# Patient Record
Sex: Female | Born: 1940 | Race: White | Hispanic: No | Marital: Married | Smoking: Former smoker
Health system: Southern US, Community
[De-identification: ages and names within clinical notes are randomized; demographics above are authoritative.]

## PROBLEM LIST (undated history)

## (undated) DIAGNOSIS — I5021 Acute systolic (congestive) heart failure: Secondary | ICD-10-CM

## (undated) DIAGNOSIS — S42309A Unspecified fracture of shaft of humerus, unspecified arm, initial encounter for closed fracture: Secondary | ICD-10-CM

## (undated) DIAGNOSIS — Z950 Presence of cardiac pacemaker: Secondary | ICD-10-CM

## (undated) DIAGNOSIS — I214 Non-ST elevation (NSTEMI) myocardial infarction: Secondary | ICD-10-CM

## (undated) DIAGNOSIS — R001 Bradycardia, unspecified: Secondary | ICD-10-CM

## (undated) DIAGNOSIS — K589 Irritable bowel syndrome without diarrhea: Secondary | ICD-10-CM

## (undated) DIAGNOSIS — C439 Malignant melanoma of skin, unspecified: Secondary | ICD-10-CM

## (undated) DIAGNOSIS — R739 Hyperglycemia, unspecified: Secondary | ICD-10-CM

## (undated) HISTORY — DX: Unspecified fracture of shaft of humerus, unspecified arm, initial encounter for closed fracture: S42.309A

## (undated) HISTORY — DX: Non-ST elevation (NSTEMI) myocardial infarction: I21.4

## (undated) HISTORY — DX: Malignant melanoma of skin, unspecified: C43.9

## (undated) HISTORY — DX: Acute systolic (congestive) heart failure: I50.21

## (undated) HISTORY — DX: Irritable bowel syndrome, unspecified: K58.9

## (undated) HISTORY — PX: COLONOSCOPY: SHX174

## (undated) HISTORY — DX: Presence of cardiac pacemaker: Z95.0

## (undated) SURGERY — Surgical Case
Anesthesia: *Unknown

---
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
## 1997-09-19 ENCOUNTER — Other Ambulatory Visit: Admission: RE | Admit: 1997-09-19 | Discharge: 1997-09-19 | Payer: Self-pay | Admitting: Gynecology

## 1998-05-24 HISTORY — PX: MELANOMA EXCISION: SHX5266

## 1998-10-16 ENCOUNTER — Other Ambulatory Visit: Admission: RE | Admit: 1998-10-16 | Discharge: 1998-10-16 | Payer: Self-pay | Admitting: Gynecology

## 2000-03-10 ENCOUNTER — Other Ambulatory Visit: Admission: RE | Admit: 2000-03-10 | Discharge: 2000-03-10 | Payer: Self-pay | Admitting: Gynecology

## 2001-04-06 ENCOUNTER — Other Ambulatory Visit: Admission: RE | Admit: 2001-04-06 | Discharge: 2001-04-06 | Payer: Self-pay | Admitting: Gynecology

## 2001-12-29 ENCOUNTER — Encounter: Payer: Self-pay | Admitting: Family Medicine

## 2001-12-29 ENCOUNTER — Encounter: Admission: RE | Admit: 2001-12-29 | Discharge: 2001-12-29 | Payer: Self-pay | Admitting: Family Medicine

## 2002-07-02 ENCOUNTER — Other Ambulatory Visit: Admission: RE | Admit: 2002-07-02 | Discharge: 2002-07-02 | Payer: Self-pay | Admitting: Gynecology

## 2003-05-25 HISTORY — PX: SKIN CANCER DESTRUCTION: SHX778

## 2003-05-25 LAB — HM COLONOSCOPY

## 2003-09-09 ENCOUNTER — Other Ambulatory Visit: Admission: RE | Admit: 2003-09-09 | Discharge: 2003-09-09 | Payer: Self-pay | Admitting: Gynecology

## 2004-11-16 ENCOUNTER — Other Ambulatory Visit: Admission: RE | Admit: 2004-11-16 | Discharge: 2004-11-16 | Payer: Self-pay | Admitting: Gynecology

## 2006-09-28 ENCOUNTER — Ambulatory Visit: Payer: Self-pay | Admitting: Internal Medicine

## 2006-11-21 DIAGNOSIS — C4360 Malignant melanoma of unspecified upper limb, including shoulder: Secondary | ICD-10-CM | POA: Insufficient documentation

## 2006-11-21 DIAGNOSIS — G20 Parkinson's disease: Secondary | ICD-10-CM | POA: Insufficient documentation

## 2006-11-21 DIAGNOSIS — K589 Irritable bowel syndrome without diarrhea: Secondary | ICD-10-CM | POA: Insufficient documentation

## 2007-01-03 ENCOUNTER — Ambulatory Visit: Payer: Self-pay | Admitting: Internal Medicine

## 2007-01-04 LAB — CONVERTED CEMR LAB
ALT: 14 units/L (ref 0–35)
AST: 19 units/L (ref 0–37)
Albumin: 3.6 g/dL (ref 3.5–5.2)
Alkaline Phosphatase: 33 units/L — ABNORMAL LOW (ref 39–117)
Basophils Absolute: 0 10*3/uL (ref 0.0–0.1)
Basophils Relative: 0.4 % (ref 0.0–1.0)
Bilirubin, Direct: 0.1 mg/dL (ref 0.0–0.3)
Cholesterol: 176 mg/dL (ref 0–200)
Eosinophils Absolute: 0.2 10*3/uL (ref 0.0–0.6)
Eosinophils Relative: 2.6 % (ref 0.0–5.0)
HCT: 39.3 % (ref 36.0–46.0)
HDL: 49.3 mg/dL (ref >39.0)
Hemoglobin: 13.5 g/dL (ref 12.0–15.0)
LDL Cholesterol: 95 mg/dL (ref 0–99)
Lymphocytes Relative: 31.7 % (ref 12.0–46.0)
MCHC: 34.5 g/dL (ref 30.0–36.0)
MCV: 97.7 fL (ref 78.0–100.0)
Monocytes Absolute: 0.6 10*3/uL (ref 0.2–0.7)
Monocytes Relative: 7.7 % (ref 3.0–11.0)
Neutro Abs: 4.6 10*3/uL (ref 1.4–7.7)
Neutrophils Relative %: 57.6 % (ref 43.0–77.0)
Platelets: 197 10*3/uL (ref 150–400)
RBC: 4.02 M/uL (ref 3.87–5.11)
RDW: 12.2 % (ref 11.5–14.6)
TSH: 1.9 microintl units/mL (ref 0.35–5.50)
Total Bilirubin: 0.7 mg/dL (ref 0.3–1.2)
Total CHOL/HDL Ratio: 3.6
Total Protein: 6.6 g/dL (ref 6.0–8.3)
Triglycerides: 158 mg/dL — ABNORMAL HIGH (ref 0–149)
VLDL: 32 mg/dL (ref 0–40)
WBC: 7.9 10*3/uL (ref 4.5–10.5)

## 2007-03-24 ENCOUNTER — Ambulatory Visit: Payer: Self-pay | Admitting: Internal Medicine

## 2007-08-11 ENCOUNTER — Ambulatory Visit: Payer: Self-pay | Admitting: Family Medicine

## 2007-10-24 ENCOUNTER — Ambulatory Visit: Payer: Self-pay | Admitting: Internal Medicine

## 2007-11-20 ENCOUNTER — Telehealth: Payer: Self-pay | Admitting: *Deleted

## 2008-02-26 ENCOUNTER — Ambulatory Visit: Payer: Self-pay | Admitting: Internal Medicine

## 2008-02-26 LAB — CONVERTED CEMR LAB
Bilirubin Urine: NEGATIVE
Blood in Urine, dipstick: NEGATIVE
Glucose, Urine, Semiquant: NEGATIVE
Ketones, urine, test strip: NEGATIVE
Nitrite: NEGATIVE
Protein, U semiquant: NEGATIVE
Specific Gravity, Urine: 1.02
Urobilinogen, UA: 0.2
pH: 5.5

## 2008-02-27 LAB — CONVERTED CEMR LAB
ALT: 17 units/L (ref 0–35)
AST: 21 units/L (ref 0–37)
Albumin: 3.9 g/dL (ref 3.5–5.2)
Alkaline Phosphatase: 47 units/L (ref 39–117)
BUN: 13 mg/dL (ref 6–23)
Basophils Absolute: 0 10*3/uL (ref 0.0–0.1)
Basophils Relative: 0.2 % (ref 0.0–3.0)
Bilirubin, Direct: 0.1 mg/dL (ref 0.0–0.3)
CO2: 31 meq/L (ref 19–32)
Calcium: 9.1 mg/dL (ref 8.4–10.5)
Chloride: 106 meq/L (ref 96–112)
Creatinine, Ser: 0.8 mg/dL (ref 0.4–1.2)
Eosinophils Absolute: 0.1 10*3/uL (ref 0.0–0.7)
Eosinophils Relative: 2.2 % (ref 0.0–5.0)
GFR calc Af Amer: 92 mL/min
GFR calc non Af Amer: 76 mL/min
Glucose, Bld: 84 mg/dL (ref 70–99)
HCT: 40.5 % (ref 36.0–46.0)
Hemoglobin: 13.8 g/dL (ref 12.0–15.0)
Lymphocytes Relative: 29.2 % (ref 12.0–46.0)
MCHC: 34.2 g/dL (ref 30.0–36.0)
MCV: 99.3 fL (ref 78.0–100.0)
Monocytes Absolute: 0.4 10*3/uL (ref 0.1–1.0)
Monocytes Relative: 6 % (ref 3.0–12.0)
Neutro Abs: 3.7 10*3/uL (ref 1.4–7.7)
Neutrophils Relative %: 62.4 % (ref 43.0–77.0)
Platelets: 141 10*3/uL — ABNORMAL LOW (ref 150–400)
Potassium: 3.8 meq/L (ref 3.5–5.1)
RBC: 4.08 M/uL (ref 3.87–5.11)
RDW: 11.8 % (ref 11.5–14.6)
Sodium: 146 meq/L — ABNORMAL HIGH (ref 135–145)
TSH: 2.35 microintl units/mL (ref 0.35–5.50)
Total Bilirubin: 0.7 mg/dL (ref 0.3–1.2)
Total Protein: 6.9 g/dL (ref 6.0–8.3)
WBC: 5.9 10*3/uL (ref 4.5–10.5)

## 2008-05-15 ENCOUNTER — Telehealth: Payer: Self-pay | Admitting: *Deleted

## 2008-05-15 ENCOUNTER — Ambulatory Visit: Payer: Self-pay | Admitting: Family Medicine

## 2009-02-07 ENCOUNTER — Ambulatory Visit: Payer: Self-pay | Admitting: Family Medicine

## 2009-02-17 ENCOUNTER — Encounter: Admission: RE | Admit: 2009-02-17 | Discharge: 2009-03-31 | Payer: Self-pay | Admitting: Internal Medicine

## 2009-02-21 LAB — CONVERTED CEMR LAB: Pap Smear: NORMAL

## 2009-02-21 LAB — HM MAMMOGRAPHY: HM Mammogram: NORMAL

## 2009-02-28 ENCOUNTER — Encounter: Payer: Self-pay | Admitting: Internal Medicine

## 2009-03-24 ENCOUNTER — Ambulatory Visit: Payer: Self-pay | Admitting: Internal Medicine

## 2009-03-26 LAB — CONVERTED CEMR LAB
ALT: 17 units/L (ref 0–35)
AST: 24 units/L (ref 0–37)
Albumin: 3.9 g/dL (ref 3.5–5.2)
Alkaline Phosphatase: 49 units/L (ref 39–117)
BUN: 10 mg/dL (ref 6–23)
Basophils Absolute: 0 10*3/uL (ref 0.0–0.1)
Basophils Relative: 0.2 % (ref 0.0–3.0)
Bilirubin, Direct: 0 mg/dL (ref 0.0–0.3)
CO2: 32 meq/L (ref 19–32)
Calcium: 9 mg/dL (ref 8.4–10.5)
Chloride: 109 meq/L (ref 96–112)
Creatinine, Ser: 0.8 mg/dL (ref 0.4–1.2)
Eosinophils Absolute: 0.2 10*3/uL (ref 0.0–0.7)
Eosinophils Relative: 3.6 % (ref 0.0–5.0)
GFR calc non Af Amer: 75.9 mL/min (ref >60)
Glucose, Bld: 88 mg/dL (ref 70–99)
HCT: 38 % (ref 36.0–46.0)
Hemoglobin: 13.6 g/dL (ref 12.0–15.0)
Lymphocytes Relative: 31.3 % (ref 12.0–46.0)
Lymphs Abs: 1.8 10*3/uL (ref 0.7–4.0)
MCHC: 35.9 g/dL (ref 30.0–36.0)
MCV: 98.6 fL (ref 78.0–100.0)
Monocytes Absolute: 0.4 10*3/uL (ref 0.1–1.0)
Monocytes Relative: 7.3 % (ref 3.0–12.0)
Neutro Abs: 3.2 10*3/uL (ref 1.4–7.7)
Neutrophils Relative %: 57.6 % (ref 43.0–77.0)
Platelets: 140 10*3/uL — ABNORMAL LOW (ref 150.0–400.0)
Potassium: 4.3 meq/L (ref 3.5–5.1)
RBC: 3.85 M/uL — ABNORMAL LOW (ref 3.87–5.11)
RDW: 11.9 % (ref 11.5–14.6)
Sodium: 144 meq/L (ref 135–145)
TSH: 3.25 microintl units/mL (ref 0.35–5.50)
Total Bilirubin: 0.7 mg/dL (ref 0.3–1.2)
Total Protein: 6.9 g/dL (ref 6.0–8.3)
Vit D, 25-Hydroxy: 80 ng/mL (ref 30–89)
WBC: 5.6 10*3/uL (ref 4.5–10.5)

## 2009-03-31 ENCOUNTER — Encounter (INDEPENDENT_AMBULATORY_CARE_PROVIDER_SITE_OTHER): Payer: Self-pay | Admitting: *Deleted

## 2009-09-12 ENCOUNTER — Encounter: Payer: Self-pay | Admitting: Internal Medicine

## 2009-09-17 ENCOUNTER — Ambulatory Visit: Payer: Self-pay | Admitting: Internal Medicine

## 2009-09-17 DIAGNOSIS — N951 Menopausal and female climacteric states: Secondary | ICD-10-CM | POA: Insufficient documentation

## 2009-09-30 ENCOUNTER — Telehealth: Payer: Self-pay | Admitting: Internal Medicine

## 2010-01-12 ENCOUNTER — Encounter: Payer: Self-pay | Admitting: Internal Medicine

## 2010-03-16 ENCOUNTER — Encounter: Payer: Self-pay | Admitting: Internal Medicine

## 2010-06-23 NOTE — Letter (Signed)
Summary: [facility]  [facility]   Imported By: Maryln Gottron 09/19/2009 15:45:34  _____________________________________________________________________  External Attachment:    Type:   Image     Comment:   External Document

## 2010-06-23 NOTE — Assessment & Plan Note (Signed)
Summary: always tired//ccm   Vital Signs:  Patient profile:   70 year old female Weight:      140 pounds Temp:     98.3 degrees F oral Pulse rate:   68 / minute Pulse rhythm:   regular Resp:     12 per minute BP sitting:   146 / 64  (left arm) Cuff size:   regular  Vitals Entered By: Gladis Riffle, RN (September 17, 2009 10:13 AM) CC: c/o hot flashes and fatigue since stopping hormones, also vaginal dryness so sex hurts, back stiffnes in AM and tightness at back of legs Is Patient Diabetic? No Comments discuss amantadine   CC:  c/o hot flashes and fatigue since stopping hormones, also vaginal dryness so sex hurts, and back stiffnes in AM and tightness at back of legs.  Preventive Screening-Counseling & Management  Alcohol-Tobacco     Smoking Status: never     Year Quit: 1975  Current Medications (verified): 1)  Librax 2.5-5 Mg Caps (Clidinium-Chlordiazepoxide) .... Take 1 Capsule By Mouth Three Times Daily Prn 2)  Multivitamins   Tabs (Multiple Vitamin) .... Once Daily 3)  Vitamin E Complex 400 Unit  Caps (Vitamin E) .... Once Daily 4)  B-50 Complex   Tabs (B Complex-Folic Acid) .... Once Daily 5)  Fish Oil 1000 Mg  Caps (Omega-3 Fatty Acids) .... Once Daily  Allergies (verified): No Known Drug Allergies   Impression & Recommendations:  Problem # 1:  MENOPAUSAL SYNDROME (ICD-627.2) 20 minute discussion about sxs she admits to fatigue crying vaginal dryness poor sleep hot flashes stopper hrt 1 months ago I'm convinced all of her sxs are related to [facility] HRT side efects discussed total time 20 minutes The following medications were removed from the medication list:    Premarin 0.3 Mg Tabs (Estrogens conjugated) ..... One by mouth daily Her updated medication list for this problem includes:    Premarin 0.3 Mg Tabs (Estrogens conjugated) ..... One by mouth daily  Complete Medication List: 1)  Librax 2.5-5 Mg Caps (Clidinium-chlordiazepoxide) .... Take 1  capsule by mouth three times daily prn 2)  Multivitamins Tabs (Multiple vitamin) .... Once daily 3)  Vitamin E Complex 400 Unit Caps (Vitamin e) .... Once daily 4)  B-50 Complex Tabs (B complex-folic acid) .... Once daily 5)  Fish Oil 1000 Mg Caps (Omega-3 fatty acids) .... Once daily 6)  Premarin 0.3 Mg Tabs (Estrogens conjugated) .... One by mouth daily 7)  Provera 5 Mg Tabs (Medroxyprogesterone acetate) .... Take 1 tablet by mouth once a day or as directed.  Patient Instructions: 1)  . Prescriptions: PROVERA 5 MG TABS (MEDROXYPROGESTERONE ACETATE) Take 1 tablet by mouth once a day or as directed.  #90 x 3   Entered and Authorized by:   Birdie Sons MD   Signed by:   Birdie Sons MD on 09/17/2009   Method used:   Electronically to        [facility] # [id number]* (retail)       [address]       [place], [place]  [address]       Ph: [phone]       Fax: [phone]   RxID:   [id number] PREMARIN 0.3 MG TABS (ESTROGENS CONJUGATED) one by mouth daily  #90 x 3   Entered and Authorized by:   Birdie Sons MD   Signed by:   Birdie Sons MD on 09/17/2009   Method used:   Electronically to  [facility] # [id number]* (retail)       [address]       [place], [place]  [address]       Ph: [phone]       Fax: [phone]   RxID:   [id number] PROVERA 5 MG TABS (MEDROXYPROGESTERONE ACETATE) Take 1 tablet by mouth once a day or as directed.  #90 x 3   Entered and Authorized by:   Birdie Sons MD   Signed by:   Birdie Sons MD on 09/17/2009   Method used:   Print then Give to Patient   RxID:   [id number] PREMARIN 0.3 MG TABS (ESTROGENS CONJUGATED) one by mouth daily  #90 x 3   Entered and Authorized by:   Birdie Sons MD   Signed by:   Birdie Sons MD on 09/17/2009   Method used:   Print then Give to Patient   RxID:   [id number]

## 2010-06-23 NOTE — Miscellaneous (Signed)
Summary: Immunization Entry   Immunization History:  Influenza Immunization History:    Influenza:  historical (03/13/2010) 

## 2010-06-23 NOTE — Progress Notes (Signed)
Summary: REQ FOR MEDICATION CHANGE (Medroxyprogesterone   to   Norethin)  Phone Note Call from Patient   Caller: Patient Reason for Call: Talk to Nurse, Talk to Doctor Summary of Call: Pt req that her for Rx for medication (Provera 5 Mg Tabs (Medroxyprogesterone acetate)  be changed to Norethin 5 mg Tabs.... Pt adv that these pills will be easier for her to cut in half because they are bigger.... Pt adv that Rx for med can be sent to [facility].  Pt can be reached at [phone] with any questions or concerns.  Initial call taken by: Debbra Riding,  Sep 30, 2009 1:23 PM  Follow-up for Phone Call        i don't think these are the same meds would not change Follow-up by: Birdie Sons MD,  Sep 30, 2009 3:09 PM  Additional Follow-up for Phone Call Additional follow up Details #1::        Phone Call Completed-----Called [phone] and left msg for pt to c/b so Dr Cato Mulligan instructions could be relayed to her.  Additional Follow-up by: Debbra Riding,  Sep 30, 2009 4:33 PM     Appended Document: REQ FOR MEDICATION CHANGE (Medroxyprogesterone   to   Norethin) Pt called back today and was advised of Dr Cato Mulligan instructions.... Pt acknowledged same.

## 2010-06-26 NOTE — Letter (Signed)
Summary: [facility]  [facility]   Imported By: Maryln Gottron 01/21/2010 15:20:05  _____________________________________________________________________  External Attachment:    Type:   Image     Comment:   External Document

## 2011-06-04 ENCOUNTER — Encounter: Payer: Self-pay | Admitting: Internal Medicine

## 2011-06-04 ENCOUNTER — Ambulatory Visit (INDEPENDENT_AMBULATORY_CARE_PROVIDER_SITE_OTHER): Payer: Medicare Other | Admitting: Internal Medicine

## 2011-06-04 VITALS — BP 130/74 | HR 72 | Temp 98.0°F | Ht 61.5 in | Wt 140.0 lb

## 2011-06-04 DIAGNOSIS — Z0000 Encounter for general adult medical examination without abnormal findings: Secondary | ICD-10-CM

## 2011-06-04 DIAGNOSIS — Z23 Encounter for immunization: Secondary | ICD-10-CM

## 2011-06-04 DIAGNOSIS — G20A1 Parkinson's disease without dyskinesia, without mention of fluctuations: Secondary | ICD-10-CM

## 2011-06-04 DIAGNOSIS — C4360 Malignant melanoma of unspecified upper limb, including shoulder: Secondary | ICD-10-CM

## 2011-06-04 DIAGNOSIS — G20 Parkinson's disease: Secondary | ICD-10-CM

## 2011-06-04 LAB — CBC WITH DIFFERENTIAL/PLATELET
Basophils Absolute: 0 10*3/uL (ref 0.0–0.1)
Lymphocytes Relative: 22.6 % (ref 12.0–46.0)
Monocytes Relative: 4.4 % (ref 3.0–12.0)
Platelets: 143 10*3/uL — ABNORMAL LOW (ref 150.0–400.0)
RDW: 12.7 % (ref 11.5–14.6)
WBC: 7.3 10*3/uL (ref 4.5–10.5)

## 2011-06-04 LAB — HEPATIC FUNCTION PANEL
AST: 18 U/L (ref 0–37)
Albumin: 4.1 g/dL (ref 3.5–5.2)
Alkaline Phosphatase: 41 U/L (ref 39–117)
Total Protein: 7.1 g/dL (ref 6.0–8.3)

## 2011-06-04 LAB — BASIC METABOLIC PANEL
Chloride: 107 mEq/L (ref 96–112)
Creatinine, Ser: 0.8 mg/dL (ref 0.4–1.2)
Potassium: 4.1 mEq/L (ref 3.5–5.1)
Sodium: 142 mEq/L (ref 135–145)

## 2011-06-04 NOTE — Progress Notes (Signed)
Subjective:    Lindsey Bryant is a 71 y.o. female who presents for Medicare Annual/Subsequent preventive examination.  Preventive Screening-Counseling & Management  Tobacco History  Smoking status  . Never Smoker   Smokeless tobacco  . Not on file     Problems Prior to Visit 1.   Current Problems (verified) Patient Active Problem List  Diagnoses  . MELANOMA, ARM  . PARKINSON'S  . IRRITABLE BOWEL SYNDROME  . MENOPAUSAL SYNDROME    Medications Prior to Visit No current outpatient prescriptions on file prior to visit.    Current Medications (verified) Current Outpatient Prescriptions  Medication Sig Dispense Refill  . fish oil-omega-3 fatty acids 1000 MG capsule Take 2 g by mouth daily.      . medroxyPROGESTERone (PROVERA) 5 MG tablet Take 1 tablet by mouth daily.      . NON FORMULARY Fungi Cure daily      . PREMARIN 0.3 MG tablet Take 1 tablet by mouth daily.      . trihexyphenidyl (ARTANE) 2 MG tablet Take 2 mg by mouth 3 (three) times daily with meals.      . vitamin E (VITAMIN E) 400 UNIT capsule Take 400 Units by mouth daily.         Allergies (verified) Review of patient's allergies indicates no known allergies.   PAST HISTORY  Family History Family History  Problem Relation Age of Onset  . Heart attack Mother   . Alcohol abuse Father   . Diabetes    . Breast cancer      Social History History  Substance Use Topics  . Smoking status: Never Smoker   . Smokeless tobacco: Not on file  . Alcohol Use: No     Are there smokers in your home (other than you)? No  Risk Factors Current exercise habits: exercises regularly: walking, water aerobics  Dietary issues discussed: no concerns   Cardiac risk factors: advanced age (older than 64 for men, 74 for women).  Depression Screen (Note: if answer to either of the following is "Yes", a more complete depression screening is indicated)   Over the past two weeks, have you felt down, depressed or hopeless?  No  Over the past two weeks, have you felt little interest or pleasure in doing things? No  Activities of Daily Living In your present state of health, do you have any difficulty performing the following activities?:  Driving? No Managing money?  No  Hearing Difficulties: No Do you often ask people to speak up or repeat themselves? No Do you experience ringing or noises in your ears? No   Do you feel that you have a problem with memory? No   Cognitive Testing  Alert? Yes  Normal Appearance?Yes    List the Names of Other Physician/Practitioners you currently use: 1.    Indicate any recent Medical Services you may have received from other than [facility] providers in the past year (date may be approximate).  Immunization History  Administered Date(s) Administered  . Influenza Whole 03/24/2007, 02/26/2008, 03/13/2010  . Pneumococcal Polysaccharide 05/24/2006  . Tdap 06/04/2011  . Zoster 02/26/2008    Screening Tests Health Maintenance  Topic Date Due  . Tetanus/tdap  08/04/1960  . Mammogram  08/05/1991  . Colonoscopy  08/05/1991  . Pneumococcal Polysaccharide Vaccine Age 104 And Over  08/05/2006  . Influenza Vaccine  02/22/2011  . Zostavax  Completed    All answers were reviewed with the patient and necessary referrals were made:  [facility],  MD   06/04/2011   History reviewed: allergies, current medications, past family history, past medical history, past social history, past surgical history and problem list  Review of Systems  patient denies chest pain, shortness of breath, orthopnea. Denies lower extremity edema, abdominal pain, change in appetite, change in bowel movements. Patient denies rashes, musculoskeletal complaints. No other specific complaints in a complete review of systems.    Objective:       Body mass index is 26.02 kg/(m^2). BP 130/74  Pulse 72  Temp(Src) 98 F (36.7 C) (Oral)  Ht 5' 1.5" (1.562 m)  Wt 140 lb (63.504 kg)  BMI 26.02 kg/m2    Well-developed well-nourished female in no acute distress. HEENT exam atraumatic, normocephalic, extraocular muscles are intact. Neck is supple. No jugular venous distention no thyromegaly. Chest clear to auscultation without increased work of breathing. Cardiac exam S1 and S2 are regular. Abdominal exam active bowel sounds, soft, nontender. Extremities no edema. Neurologic exam she is alert without any motor sensory deficits. Gait is normal.      Assessment:    Well Visit:health maint utd    Plan:     During the course of the visit the patient was educated and counseled about appropriate screening and preventive services including:    Pneumococcal vaccine   Influenza vaccine  Td vaccine  Colorectal cancer screening  Diet review for nutrition referral? Yes ____  Not Indicated _x___   Patient Instructions (the written plan) was given to the patient.  Medicare Attestation I have personally reviewed: The patient's medical and social history Their use of alcohol, tobacco or illicit drugs Their current medications and supplements The patient's functional ability including ADLs,fall risks, home safety risks, cognitive, and hearing and visual impairment Diet and physical activities Evidence for depression or mood disorders  The patient's weight, height, BMI, and visual acuity have been recorded in the chart.  I have made referrals, counseling, and provided education to the patient based on review of the above and I have provided the patient with a written personalized care plan for preventive services.     Judie Petit, MD   06/04/2011

## 2012-03-02 ENCOUNTER — Telehealth: Payer: Self-pay | Admitting: Internal Medicine

## 2012-03-02 NOTE — Telephone Encounter (Signed)
Schedule OV with next available

## 2012-03-02 NOTE — Telephone Encounter (Signed)
Pt called and said that Dr Sandria Manly is req that Dr Cato Mulligan order a stress test for pt. Pt has been experiencing weakness. Pls order test.

## 2012-03-02 NOTE — Telephone Encounter (Signed)
3 mths ago started feeling weak she has been taking trihexyphenudyl 6 mg taking 2 mg tid and Dr Sandria Manly d/c to see if that would help and it did not so Dr Sandria Manly told pt that maybe her heart should be checked.

## 2012-03-03 ENCOUNTER — Ambulatory Visit (INDEPENDENT_AMBULATORY_CARE_PROVIDER_SITE_OTHER): Payer: Medicare Other | Admitting: Family Medicine

## 2012-03-03 ENCOUNTER — Encounter: Payer: Self-pay | Admitting: Family Medicine

## 2012-03-03 ENCOUNTER — Inpatient Hospital Stay (HOSPITAL_COMMUNITY)
Admission: EM | Admit: 2012-03-03 | Discharge: 2012-03-07 | DRG: 244 | Disposition: A | Discharge status: Home / Self Care | Payer: Medicare Other | Attending: Cardiology | Admitting: Cardiology

## 2012-03-03 ENCOUNTER — Encounter (HOSPITAL_COMMUNITY): Payer: Self-pay | Admitting: *Deleted

## 2012-03-03 VITALS — BP 130/70 | Temp 98.1°F | Wt 138.0 lb

## 2012-03-03 DIAGNOSIS — Z23 Encounter for immunization: Secondary | ICD-10-CM

## 2012-03-03 DIAGNOSIS — I442 Atrioventricular block, complete: Secondary | ICD-10-CM | POA: Diagnosis present

## 2012-03-03 DIAGNOSIS — R278 Other lack of coordination: Secondary | ICD-10-CM

## 2012-03-03 DIAGNOSIS — I459 Conduction disorder, unspecified: Secondary | ICD-10-CM

## 2012-03-03 DIAGNOSIS — R5381 Other malaise: Secondary | ICD-10-CM

## 2012-03-03 DIAGNOSIS — I498 Other specified cardiac arrhythmias: Principal | ICD-10-CM | POA: Diagnosis present

## 2012-03-03 DIAGNOSIS — R279 Unspecified lack of coordination: Secondary | ICD-10-CM

## 2012-03-03 DIAGNOSIS — R9431 Abnormal electrocardiogram [ECG] [EKG]: Secondary | ICD-10-CM

## 2012-03-03 DIAGNOSIS — K589 Irritable bowel syndrome without diarrhea: Secondary | ICD-10-CM | POA: Diagnosis present

## 2012-03-03 DIAGNOSIS — G20 Parkinson's disease: Secondary | ICD-10-CM | POA: Diagnosis present

## 2012-03-03 DIAGNOSIS — G20A1 Parkinson's disease without dyskinesia, without mention of fluctuations: Secondary | ICD-10-CM | POA: Diagnosis present

## 2012-03-03 DIAGNOSIS — Z85820 Personal history of malignant melanoma of skin: Secondary | ICD-10-CM

## 2012-03-03 DIAGNOSIS — R531 Weakness: Secondary | ICD-10-CM

## 2012-03-03 HISTORY — DX: Bradycardia, unspecified: R00.1

## 2012-03-03 LAB — CBC WITH DIFFERENTIAL/PLATELET
Basophils Absolute: 0 10*3/uL (ref 0.0–0.1)
Basophils Relative: 0.4 % (ref 0.0–3.0)
Basophils Relative: 0 % (ref 0–1)
Eosinophils Absolute: 0.1 10*3/uL (ref 0.0–0.7)
Eosinophils Absolute: 0.2 10*3/uL (ref 0.0–0.7)
HCT: 39.7 % (ref 36.0–46.0)
Hemoglobin: 13.1 g/dL (ref 12.0–15.0)
Lymphocytes Relative: 32 % (ref 12.0–46.0)
MCH: 33.1 pg (ref 26.0–34.0)
MCHC: 33.1 g/dL (ref 30.0–36.0)
MCHC: 33.8 g/dL (ref 30.0–36.0)
MCV: 99.9 fl (ref 78.0–100.0)
Neutro Abs: 4.3 10*3/uL (ref 1.4–7.7)
Neutro Abs: 3.8 10*3/uL (ref 1.7–7.7)
Neutrophils Relative %: 53 % (ref 43–77)
Platelets: 155 10*3/uL (ref 150–400)
RBC: 3.97 Mil/uL (ref 3.87–5.11)
RBC: 4.23 MIL/uL (ref 3.87–5.11)

## 2012-03-03 LAB — HEPATIC FUNCTION PANEL
ALT: 12 U/L (ref 0–35)
Bilirubin, Direct: 0.1 mg/dL (ref 0.0–0.3)
Total Protein: 6.8 g/dL (ref 6.0–8.3)

## 2012-03-03 LAB — COMPREHENSIVE METABOLIC PANEL
ALT: 11 U/L (ref 0–35)
AST: 16 U/L (ref 0–37)
Albumin: 3.9 g/dL (ref 3.5–5.2)
Calcium: 10 mg/dL (ref 8.4–10.5)
GFR calc Af Amer: >90 mL/min (ref >90)
Potassium: 4.1 mEq/L (ref 3.5–5.1)
Sodium: 142 mEq/L (ref 135–145)
Total Protein: 7.3 g/dL (ref 6.0–8.3)

## 2012-03-03 LAB — BASIC METABOLIC PANEL
CO2: 29 mEq/L (ref 19–32)
Chloride: 105 mEq/L (ref 96–112)
Potassium: 3.9 mEq/L (ref 3.5–5.1)
Sodium: 141 mEq/L (ref 135–145)

## 2012-03-03 LAB — TROPONIN I: Troponin I: <0.3 ng/mL (ref <0.30)

## 2012-03-03 NOTE — H&P (Signed)
CARDIOLOGY ADMISSION NOTE  Patient ID: Lindsey Bryant MRN: [id number] DOB/AGE: 30-Dec-1940 71 y.o.  Admit date: 03/03/2012 Primary Physician   Judie Petit, MD Primary Cardiologist   None Chief Complaint    Weakness  HPI:  The patient is a pleasant patient without prior cardiac history. About a month ago she was feeling well and able to exercise routinely even going hiking. This is despite treatment with Parkinson's. However, he subsequently over this past month has noticed increasing dyspnea with these activities. She feels like her energy level was cut in half. He first saw Dr. Sandria Manly her Artane to see if this is contributing. Eventually this was discontinued and she still had the symptoms area  Today she saw Dr. Caryl Never in the office.  Initially the plan was for outpatient evaluation. However, subsequently told to come to the emergency room. Her EKG demonstrates bradycardia with high degree heart block and narrow complex escape. We are called to admit her and further evaluate.  The patient otherwise has no iliac history and no workup prior. She's never had presyncope or syncope. He denies any chest pressure, neck or arm discomfort. He's had no PND or orthopnea. He has had no weight gain or edema.   Past Medical History  Diagnosis Date  . Parkinson's disease   . IBS (irritable bowel syndrome)   . Melanoma     right arm  . Bradycardia     a. 02/2012    Past Surgical History  Procedure Date  . Scca of nose   . Melanoma excision     right arm  . Appendectomy     No Known Allergies No current facility-administered medications on file prior to encounter.   Current Outpatient Prescriptions on File Prior to Encounter  Medication Sig Dispense Refill  . fish oil-omega-3 fatty acids 1000 MG capsule Take 2 g by mouth daily.      . medroxyPROGESTERone (PROVERA) 5 MG tablet Take 2.5 mg by mouth daily.       Marland Kitchen PREMARIN 0.3 MG tablet Take 1 tablet by mouth daily.      . vitamin E  (VITAMIN E) 400 UNIT capsule Take 400 Units by mouth daily.       History   Social History  . Marital Status: Married    Spouse Name: N/A    Number of Children: N/A  . Years of Education: N/A   Occupational History  . Not on file.   Social History Main Topics  . Smoking status: Never Smoker   . Smokeless tobacco: Not on file  . Alcohol Use: No  . Drug Use: No  . Sexually Active:    Other Topics Concern  . Not on file   Social History Narrative  . No narrative on file    Family History  Problem Relation Age of Onset  . Heart attack Mother   . Alcohol abuse Father   . Diabetes    . Breast cancer      ROS:  As stated in the HPI and negative for all other systems.  Physical Exam: Blood pressure 155/69, pulse 66, temperature 98.4 F (36.9 C), temperature source Oral, resp. rate 18, SpO2 98.00%.  GENERAL:  Well appearing HEENT:  Pupils equal round and reactive, fundi not visualized, oral mucosa unremarkable NECK:  No jugular venous distention, waveform within normal limits, carotid upstroke brisk and symmetric, no bruits, no thyromegaly LYMPHATICS:  No cervical, inguinal adenopathy LUNGS:  Clear to auscultation bilaterally BACK:  No CVA  tenderness CHEST:  Unremarkable HEART:  PMI not displaced or sustained,S1 and S2 within normal limits, no S3, no S4, no clicks, no rubs, no murmurs ABD:  Flat, positive bowel sounds normal in frequency in pitch, no bruits, no rebound, no guarding, no midline pulsatile mass, no hepatomegaly, no splenomegaly EXT:  2 plus pulses throughout, no edema, no cyanosis no clubbing SKIN:  No rashes no nodules NEURO:  Cranial nerves II through XII grossly intact, motor grossly intact throughout, resting tremor PSYCH:  Cognitively intact, oriented to person place and time  Labs: Lab Results  Component Value Date   BUN 12 03/03/2012   Lab Results  Component Value Date   CREATININE 0.78 03/03/2012   Lab Results  Component Value Date   NA 142  03/03/2012   K 4.1 03/03/2012   CL 106 03/03/2012   CO2 28 03/03/2012   Lab Results  Component Value Date   TROPONINI <0.30 03/03/2012   Lab Results  Component Value Date   WBC 7.1 03/03/2012   HGB 14.0 03/03/2012   HCT 41.4 03/03/2012   MCV 97.9 03/03/2012   PLT 155 03/03/2012    Lab Results  Component Value Date   ALT 11 03/03/2012   AST 16 03/03/2012   ALKPHOS 50 03/03/2012   BILITOT 0.3 03/03/2012    EKG:  NSR rate 75, high degree heart block with narrow complex escape beats.  No acute St T wave changes.  03/03/2012  ASSESSMENT AND PLAN:    Bradycardia - Patient will be admitted to step down and monitored. The symptoms in the high degree heart block pacemaker will be indicated. I discussed this with her. Electrophysiology service will be contacted on Monday. She has been placed on the had on board and will need to be n.p.o. after midnight on Sunday.  I will check a thyroid profile and cycle cardiac enzymes though I do not strongly suspect any coronary involvement.  Parkinson's - I suspect the patient can be started back on her Artane. However, I will defer to Dr. Sandria Manly  Signed: Rollene Rotunda 03/03/2012, 9:52 PM

## 2012-03-03 NOTE — ED Notes (Signed)
The pt has had weakness for 2 months.  She was sent here by dr Caryl Never today for tests

## 2012-03-03 NOTE — ED Notes (Signed)
Patient unable to use the restroom at this time to provide a sample.  No orders pending for urine.

## 2012-03-03 NOTE — ED Notes (Signed)
Unable to get a bp  The pt  Has parkinsons disease and she is in constant motion

## 2012-03-03 NOTE — Progress Notes (Signed)
Subjective:    Patient ID: Lindsey Bryant, female    DOB: [birth year], 70 y.o.   MRN: [id number]  HPI  Patient seen with complaints of generalized weakness progressive over the past few months. She has history of Parkinson's disease. She initially thought this was due to her medication (Artane) but after scaling back medication and now being off this medication altogether for 2 weeks she has not seen any improvement. She has generalized weakness. No focal weakness. She's also noticed some dyspnea when things like climbing stairs which is a new symptom. Never had any chest pain. Generally sleeping okay. Appetite and weight are stable. No fever. No urinary symptoms. No cough. No abdominal pain. She's had in general decreased exercise tolerance which she attributes mostly to generalized weakness. No recent lab work.  Nonsmoker. No history of diabetes. No history of hypertension. Positive family history of CAD in mother and brother.  Past Medical History  Diagnosis Date  . Parkinson's disease   . IBS (irritable bowel syndrome)   . Melanoma     right arm  . Bradycardia     a. 02/2012   Past Surgical History  Procedure Date  . 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 Melanoma excision     right arm  . Appendectomy     reports that she has never smoked. She does not have any smokeless tobacco history on file. She reports that she does not drink alcohol or use illicit drugs. family history includes Alcohol abuse in her father; Breast cancer in an unspecified family member; Diabetes in her brother; and Heart attack (age of onset:50) in her mother. No Known Allergies    Review of Systems  Constitutional: Positive for fatigue. Negative for unexpected weight change.  Eyes: Negative for visual disturbance.  Respiratory: Positive for shortness of breath.   Cardiovascular: Negative for chest pain, palpitations and leg swelling.  Gastrointestinal: Negative for nausea, vomiting,  abdominal pain and diarrhea.  Genitourinary: Negative for dysuria.  Neurological: Positive for tremors and weakness. Negative for dizziness, syncope and headaches.  Psychiatric/Behavioral: Negative for dysphoric mood.       Objective:   Physical Exam  Constitutional: She is oriented to person, place, and time. She appears well-developed and well-nourished.  HENT:  Mouth/Throat: Oropharynx is clear and moist.  Neck: Neck supple. No thyromegaly present.  Cardiovascular: Normal rate and regular rhythm.   Pulmonary/Chest: Effort normal and breath sounds normal. No respiratory distress. She has no wheezes. She has no rales.  Abdominal: Soft. Bowel sounds are normal. She exhibits no distension and no mass. There is no tenderness. There is no rebound and no guarding.  Musculoskeletal: She exhibits no edema.  Lymphadenopathy:    She has no cervical adenopathy.  Neurological: She is alert and oriented to person, place, and time. No cranial nerve deficit.       Patient has significant tremor which is reduced with activity consistent with her Parkinson's disease. No focal weakness.  Skin: No rash noted.  Psychiatric: She has a normal mood and affect. Her behavior is normal.          Assessment & Plan:  Generalized weakness. Uncertain etiology. Start with some basic lab work. She does describe some dyspnea with activity. Chest x-ray. Consider nuclear stress test versus echo. Will check EKG first.  Parkinson's disease. Follow closely by neurology  Health maintenance. Flu vaccine given.  EKG here in office showed p waves with dropped QRS-?junctional escape  rhythm.  Reviewed by cardiology who concurs with admission for further evaluation and monitoring.

## 2012-03-04 LAB — CK TOTAL AND CKMB (NOT AT ARMC): Relative Index: INVALID (ref 0.0–2.5)

## 2012-03-04 LAB — TROPONIN I
Troponin I: <0.3 ng/mL (ref <0.30)
Troponin I: <0.3 ng/mL (ref <0.30)

## 2012-03-04 LAB — MRSA PCR SCREENING: MRSA by PCR: NEGATIVE

## 2012-03-04 LAB — CBC
Platelets: 147 10*3/uL — ABNORMAL LOW (ref 150–400)
RBC: 4.03 MIL/uL (ref 3.87–5.11)
WBC: 8 10*3/uL (ref 4.0–10.5)

## 2012-03-04 LAB — TSH: TSH: 6.463 u[IU]/mL — ABNORMAL HIGH (ref 0.350–4.500)

## 2012-03-04 LAB — COMPREHENSIVE METABOLIC PANEL
CO2: 27 mEq/L (ref 19–32)
Calcium: 9.7 mg/dL (ref 8.4–10.5)
Creatinine, Ser: 0.75 mg/dL (ref 0.50–1.10)
GFR calc Af Amer: >90 mL/min (ref >90)
GFR calc non Af Amer: 84 mL/min — ABNORMAL LOW (ref >90)
Glucose, Bld: 92 mg/dL (ref 70–99)
Total Protein: 7 g/dL (ref 6.0–8.3)

## 2012-03-04 LAB — LIPID PANEL
Cholesterol: 196 mg/dL (ref 0–200)
HDL: 77 mg/dL (ref >39)
Triglycerides: 141 mg/dL (ref <150)

## 2012-03-04 MED ORDER — ONDANSETRON HCL 4 MG/2ML IJ SOLN: 4.0000 mg | Freq: Four times a day (QID) | INTRAMUSCULAR | Status: DC | PRN

## 2012-03-04 MED ORDER — ASPIRIN 81 MG PO CHEW
81.0000 mg | CHEWABLE_TABLET | Freq: Every day | ORAL | Status: DC
  Filled 2012-03-04 (×3): qty 1

## 2012-03-04 MED ORDER — NITROGLYCERIN 0.4 MG SL SUBL: 0.4000 mg | SUBLINGUAL_TABLET | SUBLINGUAL | Status: DC | PRN

## 2012-03-04 MED ORDER — TRIHEXYPHENIDYL HCL 2 MG PO TABS
2.0000 mg | ORAL_TABLET | Freq: Three times a day (TID) | ORAL | Status: DC
  Filled 2012-03-04 (×13): qty 1

## 2012-03-04 MED ORDER — ESTROGENS CONJUGATED 0.3 MG PO TABS
0.3000 mg | ORAL_TABLET | Freq: Every day | ORAL | Status: DC
  Administered 2012-03-04 – 2012-03-06 (×3): 0.3 mg via ORAL
  Filled 2012-03-04 (×4): qty 1

## 2012-03-04 MED ORDER — ASPIRIN 81 MG PO CHEW
324.0000 mg | CHEWABLE_TABLET | ORAL | Status: AC
  Administered 2012-03-04: 324 mg via ORAL
  Filled 2012-03-04: qty 4

## 2012-03-04 MED ORDER — POLYETHYLENE GLYCOL 3350 17 G PO PACK
17.0000 g | PACK | Freq: Every day | ORAL | Status: DC | PRN
  Filled 2012-03-04: qty 1

## 2012-03-04 MED ORDER — ACETAMINOPHEN 325 MG PO TABS
650.0000 mg | ORAL_TABLET | ORAL | Status: DC | PRN
  Administered 2012-03-04: 650 mg via ORAL
  Filled 2012-03-04: qty 2

## 2012-03-04 MED ORDER — MEDROXYPROGESTERONE ACETATE 2.5 MG PO TABS
2.5000 mg | ORAL_TABLET | Freq: Every day | ORAL | Status: DC
  Administered 2012-03-04 – 2012-03-07 (×4): 2.5 mg via ORAL
  Filled 2012-03-04 (×4): qty 1

## 2012-03-04 MED ORDER — HEPARIN SODIUM (PORCINE) 5000 UNIT/ML IJ SOLN
5000.0000 [IU] | Freq: Three times a day (TID) | INTRAMUSCULAR | Status: DC
  Administered 2012-03-04 – 2012-03-05 (×6): 5000 [IU] via SUBCUTANEOUS
  Filled 2012-03-04 (×11): qty 1

## 2012-03-04 MED ORDER — ASPIRIN 300 MG RE SUPP
300.0000 mg | RECTAL | Status: AC
  Filled 2012-03-04: qty 1

## 2012-03-04 MED ORDER — ASPIRIN EC 81 MG PO TBEC
81.0000 mg | DELAYED_RELEASE_TABLET | Freq: Every day | ORAL | Status: DC
  Administered 2012-03-04 – 2012-03-07 (×4): 81 mg via ORAL
  Filled 2012-03-04 (×4): qty 1

## 2012-03-04 NOTE — Progress Notes (Signed)
Pt has tremors and hard for monitor to read BP. Pt declines BP to be taken per unit protocol because it "hurts her arms" attempted to do BP in LE but it also caused pain for the patient. Manual BP obtained. Pt agree to BPs q4 hours. Will monitor.

## 2012-03-04 NOTE — Progress Notes (Signed)
Patient ID: Lindsey Bryant, female   DOB: 16-Apr-1941, 71 y.o.   MRN: [id number]   Patient Name: Lindsey Bryant Date of Encounter: 03/04/2012    SUBJECTIVE  Patient is without complaint this morning. No shortness of breath chest pain or palpitations. Heart rate in the 40s with narrow complex. No change in EKG.  She really would like to get back on her Parkinson's med. She takes Artane.  CURRENT MEDS    . aspirin  324 mg Oral NOW   Or  . aspirin  300 mg Rectal NOW  . aspirin  81 mg Oral Daily  . aspirin EC  81 mg Oral Daily  . estrogens (conjugated)  0.3 mg Oral Daily  . heparin  5,000 Units Subcutaneous Q8H  . medroxyPROGESTERone  2.5 mg Oral Daily    OBJECTIVE  Filed Vitals:   03/04/12 0400 03/04/12 0600 03/04/12 0800 03/04/12 0820  BP: 160/80 162/80 132/96 132/96  Pulse: 45 49 44 55  Temp:   97.9 F (36.6 C)   TempSrc:   Oral   Resp: 14 12 14 12   Height:      Weight:      SpO2: 100% 98% 99% 98%    Intake/Output Summary (Last 24 hours) at 03/04/12 1011 Last data filed at 03/04/12 0700  Gross per 24 hour  Intake    120 ml  Output    100 ml  Net     20 ml   Filed Weights   03/04/12 0151  Weight: 134 lb 11.2 oz (61.1 kg)    PHYSICAL EXAM  General: Pleasant, NAD. Neuro: Alert and oriented X 3. Moves all extremities spontaneously. Continual tremor and pill rolling. Psych: Normal affect. HEENT:  Normal  Neck: Supple without bruits or JVD. Lungs:  Resp regular and unlabored, CTA. Heart: RRR no s3, s4, or murmurs. Abdomen: Soft, non-tender, non-distended, BS + x 4.  Extremities: No clubbing, cyanosis or edema. DP/PT/Radials 2+ and equal bilaterally.  Accessory Clinical Findings  CBC  Basename 03/04/12 0235 03/03/12 1815 03/03/12 1523  WBC 8.0 7.1 --  NEUTROABS -- 3.8 4.3  HGB 13.4 14.0 --  HCT 39.5 41.4 --  MCV 98.0 97.9 --  PLT 147* 155 --   Basic Metabolic Panel  Basename 03/04/12 0235 03/03/12 1815  NA 142 142  K 4.1 4.1  CL 106 106  CO2 27  28  GLUCOSE 92 87  BUN 9 12  CREATININE 0.75 0.78  CALCIUM 9.7 10.0  MG 2.4 --  PHOS -- --   Liver Function Tests  Basename 03/04/12 0235 03/03/12 1815  AST 16 16  ALT 10 11  ALKPHOS 48 50  BILITOT 0.3 0.3  PROT 7.0 7.3  ALBUMIN 3.7 3.9   No results found for this basename: LIPASE:2,AMYLASE:2 in the last 72 hours Cardiac Enzymes  Basename 03/04/12 0234 03/03/12 1814  CKTOTAL 56 --  CKMB 1.6 --  CKMBINDEX -- --  TROPONINI <0.30 <0.30   BNP No components found with this basename: POCBNP:3 D-Dimer No results found for this basename: DDIMER:2 in the last 72 hours Hemoglobin A1C No results found for this basename: HGBA1C in the last 72 hours Fasting Lipid Panel  Basename 03/04/12 0235  CHOL 196  HDL 77  LDLCALC 91  TRIG 141  CHOLHDL 2.5  LDLDIRECT --   Thyroid Function Tests  Basename 03/03/12 1523  TSH 1.63  T4TOTAL --  T3FREE --  THYROIDAB --    TELE  Junctional rhythm which must be  high this narrow complex. Rate in the 40s. Complete heart block  ECG  Same as above. No acute changes.  Radiology/Studies  No results found.  ASSESSMENT AND PLAN  Principal Problem:  *Heart block    Patient is stable. Spinal pacemaker implantation on Monday. We'll restart her Artane. Cleared with pharmacy there are no cardiac side effects. Note that her TSH is normal. Troponins are negative.  Signed, Valera Castle MD

## 2012-03-05 NOTE — Progress Notes (Signed)
Utilization Review Completed.  

## 2012-03-05 NOTE — Progress Notes (Signed)
Patient ID: Lindsey Bryant, female   DOB: 25-Dec-1940, 71 y.o.   MRN: [id number]   Patient Name: Lindsey Bryant Date of Encounter: 03/05/2012    SUBJECTIVE  No complaints today except weakness.  CURRENT MEDS    . aspirin  81 mg Oral Daily  . aspirin EC  81 mg Oral Daily  . estrogens (conjugated)  0.3 mg Oral Daily  . heparin  5,000 Units Subcutaneous Q8H  . medroxyPROGESTERone  2.5 mg Oral Daily  . trihexyphenidyl  2 mg Oral TID WC    OBJECTIVE  Filed Vitals:   03/04/12 2000 03/04/12 2342 03/05/12 0355 03/05/12 0839  BP:  132/70 140/78 117/72  Pulse: 67  48 48  Temp:  98.1 F (36.7 C) 97.6 F (36.4 C) 98.2 F (36.8 C)  TempSrc:  Oral Oral Oral  Resp:  16 18 18   Height:      Weight:      SpO2: 98% 97% 98% 97%    Intake/Output Summary (Last 24 hours) at 03/05/12 1045 Last data filed at 03/04/12 2200  Gross per 24 hour  Intake    360 ml  Output      0 ml  Net    360 ml   Filed Weights   03/04/12 0151  Weight: 134 lb 11.2 oz (61.1 kg)    PHYSICAL EXAM  General: Pleasant, NAD. Neuro: Alert and oriented X 3. Moves all extremities spontaneously. Psych: Normal affect. HEENT:  Normal  Neck: Supple without bruits or JVD. Lungs:  Resp regular and unlabored, CTA. Heart: RRR no s3, s4, or murmurs. Abdomen: Soft, non-tender, non-distended, BS + x 4.  Extremities: No clubbing, cyanosis or edema. DP/PT/Radials 2+ and equal bilaterally.  Accessory Clinical Findings  CBC  Basename 03/04/12 0235 03/03/12 1815 03/03/12 1523  WBC 8.0 7.1 --  NEUTROABS -- 3.8 4.3  HGB 13.4 14.0 --  HCT 39.5 41.4 --  MCV 98.0 97.9 --  PLT 147* 155 --   Basic Metabolic Panel  Basename 03/04/12 0235 03/03/12 1815  NA 142 142  K 4.1 4.1  CL 106 106  CO2 27 28  GLUCOSE 92 87  BUN 9 12  CREATININE 0.75 0.78  CALCIUM 9.7 10.0  MG 2.4 --  PHOS -- --   Liver Function Tests  Basename 03/04/12 0235 03/03/12 1815  AST 16 16  ALT 10 11  ALKPHOS 48 50  BILITOT 0.3 0.3  PROT 7.0  7.3  ALBUMIN 3.7 3.9   No results found for this basename: LIPASE:2,AMYLASE:2 in the last 72 hours Cardiac Enzymes  Basename 03/04/12 1404 03/04/12 0907 03/04/12 0234  CKTOTAL -- -- 56  CKMB -- -- 1.6  CKMBINDEX -- -- --  TROPONINI <0.30 <0.30 <0.30   BNP No components found with this basename: POCBNP:3 D-Dimer No results found for this basename: DDIMER:2 in the last 72 hours Hemoglobin A1C No results found for this basename: HGBA1C in the last 72 hours Fasting Lipid Panel  Basename 03/04/12 0235  CHOL 196  HDL 77  LDLCALC 91  TRIG 141  CHOLHDL 2.5  LDLDIRECT --   Thyroid Function Tests  Basename 03/04/12 0235  TSH 6.463*  T4TOTAL --  T3FREE --  THYROIDAB --    TELE  Sinus bradycardia with Mobitz 2 second degree AV block  ECG    Radiology/Studies  No results found.  ASSESSMENT AND PLAN  Principal Problem:  *Heart block    Stable. 4 pacemaker implantation tomorrow. Numerous questions answered from patient and  husband. She has decided not to start her Artane.  Signed, Valera Castle MD

## 2012-03-06 ENCOUNTER — Inpatient Hospital Stay (HOSPITAL_COMMUNITY): Payer: Medicare Other

## 2012-03-06 ENCOUNTER — Encounter (HOSPITAL_COMMUNITY)
Admission: EM | Disposition: A | Discharge status: Home / Self Care | Payer: Self-pay | Source: Home / Self Care | Attending: Cardiology

## 2012-03-06 DIAGNOSIS — I369 Nonrheumatic tricuspid valve disorder, unspecified: Secondary | ICD-10-CM

## 2012-03-06 DIAGNOSIS — I442 Atrioventricular block, complete: Secondary | ICD-10-CM

## 2012-03-06 HISTORY — PX: PERMANENT PACEMAKER INSERTION: SHX5480

## 2012-03-06 LAB — PROTIME-INR: INR: 0.96 (ref 0.00–1.49)

## 2012-03-06 SURGERY — PERMANENT PACEMAKER INSERTION
Anesthesia: LOCAL

## 2012-03-06 MED ORDER — MIDAZOLAM HCL 5 MG/5ML IJ SOLN
INTRAMUSCULAR | Status: AC
  Filled 2012-03-06: qty 5

## 2012-03-06 MED ORDER — CEFAZOLIN SODIUM 1-5 GM-% IV SOLN
1.0000 g | Freq: Four times a day (QID) | INTRAVENOUS | Status: AC
  Administered 2012-03-06 – 2012-03-07 (×3): 1 g via INTRAVENOUS
  Filled 2012-03-06 (×3): qty 50

## 2012-03-06 MED ORDER — ONDANSETRON HCL 4 MG/2ML IJ SOLN: 4.0000 mg | Freq: Four times a day (QID) | INTRAMUSCULAR | Status: DC | PRN

## 2012-03-06 MED ORDER — FENTANYL CITRATE 0.05 MG/ML IJ SOLN
INTRAMUSCULAR | Status: AC
  Filled 2012-03-06: qty 2

## 2012-03-06 MED ORDER — SODIUM CHLORIDE 0.45 % IV SOLN
INTRAVENOUS | Status: DC
  Administered 2012-03-06: 09:00:00 via INTRAVENOUS

## 2012-03-06 MED ORDER — LIDOCAINE HCL (PF) 1 % IJ SOLN
INTRAMUSCULAR | Status: AC
  Filled 2012-03-06: qty 60

## 2012-03-06 MED ORDER — SODIUM CHLORIDE 0.9 % IV SOLN
INTRAVENOUS | Status: AC
  Administered 2012-03-06: 14:00:00 via INTRAVENOUS

## 2012-03-06 MED ORDER — ACETAMINOPHEN 325 MG PO TABS
325.0000 mg | ORAL_TABLET | ORAL | Status: DC | PRN
  Administered 2012-03-06: 650 mg via ORAL
  Administered 2012-03-06: 21:00:00 325 mg via ORAL
  Filled 2012-03-06: qty 1
  Filled 2012-03-06: qty 2

## 2012-03-06 MED ORDER — CEFAZOLIN SODIUM-DEXTROSE 2-3 GM-% IV SOLR
2.0000 g | INTRAVENOUS | Status: DC
  Filled 2012-03-06: qty 50

## 2012-03-06 MED ORDER — SODIUM CHLORIDE 0.9 % IJ SOLN
3.0000 mL | Freq: Two times a day (BID) | INTRAMUSCULAR | Status: DC
  Administered 2012-03-06: 3 mL via INTRAVENOUS

## 2012-03-06 MED ORDER — SODIUM CHLORIDE 0.9 % IJ SOLN: 3.0000 mL | INTRAMUSCULAR | Status: DC | PRN

## 2012-03-06 MED ORDER — SODIUM CHLORIDE 0.9 % IR SOLN
80.0000 mg | Status: DC
  Filled 2012-03-06: qty 2

## 2012-03-06 MED ORDER — HEPARIN (PORCINE) IN NACL 2-0.9 UNIT/ML-% IJ SOLN
INTRAMUSCULAR | Status: AC
  Filled 2012-03-06: qty 500

## 2012-03-06 MED ORDER — SODIUM CHLORIDE 0.9 % IV SOLN: 250.0000 mL | INTRAVENOUS | Status: DC

## 2012-03-06 NOTE — CV Procedure (Signed)
Preop DX::high graade heart block Post op DX:: same  Procedure  dual pacemaker implantation  After routine prep and drape, lidocaine was infiltrated in the prepectoral subclavicular region on the left side an incision was made and carried down to later the prepectoral fascia using electrocautery and sharp dissection a pocket was formed similarly. Hemostasis was obtained.  After this, we turned our attention to gaining accessm to the extrathoracic,left subclavian vein. This was accomplished without difficulty and without the aspiration of air or puncture of the artery. 2 separate venipunctures were accomplished; guidewires were placed and retained and sequentially 7 French sheath through which were  passed an Medtronic 5076 ventricular lead serial [id number] and an Medtronic 5076 atrial lead serial number  [id number] .  The ventricular lead was manipulated to the right ventricular apex with a bipolar R wave was 7.2, the pacing impedance was 1076, the threshold was 0.8 @ 0.5 msec  Current at threshold was   .7  Ma and the current of injury was  brisk.  The right atrial lead was manipulated to the right atrial appendage with a bipolar P-wave  2.6, the pacing impedance was 763, the threshold 1.0@ 0.5 msec   Current at threshold was 1.3  Ma and the current of injury was brisk.  The ventricular lead was marked with a tie prior to the insertion of the atrial lead. The leads were affixed to the prepectoral fascia and attached to a  Medtronic adapta L pulse generator serial number [id number].   hemostasis was obtained. The pocket was copiously irrigated with antibiotic containing saline solution. The leads and the pulse generator were placed in the pocket and affixed to the prepectoral fascia. The wound was then closed in 2 layers in the normal fashion. The wound was washed dried and a benzoin Steri-Strip dressing was applied.  Needle  Count, sponge counts and instrument counts were correct at the  end of the procedure .   The patient tolerated the procedure without apparent complication.  Gerlene Burdock.D.

## 2012-03-06 NOTE — Progress Notes (Signed)
   SUBJECTIVE:  No pain.  No SOB   PHYSICAL EXAM Filed Vitals:   03/05/12 1626 03/05/12 2050 03/06/12 0021 03/06/12 0423  BP: 131/78 110/60 120/68 122/70  Pulse: 55 58 58   Temp: 98 F (36.7 C) 97.6 F (36.4 C) 98.1 F (36.7 C) 98.2 F (36.8 C)  TempSrc: Oral Oral Oral Oral  Resp: 18     Height:      Weight:      SpO2: 99% 98% 99%    General:  No distress Lungs:  Clear Heart:  RRR Abdomen:  Positive bowel sounds, no rebound no guarding Extremities:  No edema  LABS: Lab Results  Component Value Date   CKTOTAL 56 03/04/2012   CKMB 1.6 03/04/2012   TROPONINI <0.30 03/04/2012   No results found for this or any previous visit (from the past 24 hour(s)).  Intake/Output Summary (Last 24 hours) at 03/06/12 0748 Last data filed at 03/05/12 2200  Gross per 24 hour  Intake    840 ml  Output      0 ml  Net    840 ml    ASSESSMENT AND PLAN:  Bradycardia -  Sinus with intermittent 2:1 block currently.  Admitting EKG with symptomatic high degree block.  Discussed with Dr. Graciela Husbands.   Parkinson's -  The patient will defer Artane until she talks to Dr. Avie Echevaria [facility] 03/06/2012 7:48 AM

## 2012-03-06 NOTE — Consult Note (Signed)
ELECTROPHYSIOLOGY CONSULT NOTE  Patient ID: Lindsey Bryant, MRN: [id number], DOB/AGE: Jan 12, 1941 71 y.o. Admit date: 03/03/2012 Date of Consult: 03/06/2012  Primary Physician: Judie Petit, MD Primary Cardiologist: [facility]  Chief Complaint: weakness   HPI Lindsey Bryant is a 71 y.o. female  Seen and admitted because of progressive exercise intolerance over months and found to have bradycardia nad heart block with narrow QRS but which worsens with exercise.  It was thought potentially related to parkinson meds.  But these have been discontinued.   She has had no syncope,  She walks in the woods but checksherself for ticks.  No fevers chills or cough  Cardiac Ex neg  Past Medical History  Diagnosis Date  . Parkinson's disease   . IBS (irritable bowel syndrome)   . Melanoma     right arm  . Bradycardia     a. 02/2012      Surgical History:  Past Surgical History  Procedure Date  . 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 Melanoma excision     right arm  . Appendectomy      Home Meds: Prior to Admission medications   Medication Sig Start Date End Date Taking? Authorizing Provider  aspirin 81 MG tablet Take 81 mg by mouth daily.   Yes Historical Provider, MD  fish oil-omega-3 fatty acids 1000 MG capsule Take 2 g by mouth daily.   Yes Historical Provider, MD  medroxyPROGESTERone (PROVERA) 5 MG tablet Take 2.5 mg by mouth daily.  05/03/11  Yes Historical Provider, MD  PREMARIN 0.3 MG tablet Take 1 tablet by mouth daily. 04/29/11  Yes Historical Provider, MD  vitamin E (VITAMIN E) 400 UNIT capsule Take 400 Units by mouth daily.   Yes Historical Provider, MD    Inpatient Medications:    . aspirin EC  81 mg Oral Daily  .  ceFAZolin (ANCEF) IV  2 g Intravenous On Call  . estrogens (conjugated)  0.3 mg Oral Daily  . gentamicin irrigation  80 mg Irrigation On Call  . heparin  5,000 Units Subcutaneous Q8H  . medroxyPROGESTERone  2.5 mg Oral Daily  . sodium  chloride  3 mL Intravenous Q12H  . trihexyphenidyl  2 mg Oral TID WC  . DISCONTD: aspirin  81 mg Oral Daily     Allergies: No Known Allergies  History   Social History  . Marital Status: Married    Spouse Name: N/A    Number of Children: 2  . Years of Education: N/A   Occupational History  . Not on file.   Social History Main Topics  . Smoking status: Never Smoker   . Smokeless tobacco: Not on file  . Alcohol Use: No  . Drug Use: No  . Sexually Active:    Other Topics Concern  . Not on file   Social History Narrative  . No narrative on file     Family History  Problem Relation Age of Onset  . Heart attack Mother 21    Details unclear  . Alcohol abuse Father   . Diabetes Brother   . Breast cancer       ROS:  Please see the history of present illness }  All other systems reviewed and negative.    Physical Exam: Blood pressure 122/70, pulse 58, temperature 97.6 F (36.4 C), temperature source Oral, resp. rate 18, height 5\' 2"  (1.575 m), weight 134 lb 11.2 oz (61.1 kg), SpO2 99.00%. General:  Well developed, well nourished female in no acute distress. Head: Normocephalic, atraumatic, sclera non-icteric, no xanthomas, nares are without discharge. Lymph Nodes:  none Back: without scoliosis/kyphosis, no CVA tendersness Neck: Negative for carotid bruits. JVD not elevated. Lungs: Clear bilaterally to auscultation without wheezes, rales, or rhonchi. Breathing is unlabored. Heart: RRR with S1 S2. No  murmur , rubs, or gallops appreciated. Abdomen: Soft, non-tender, non-distended with normoactive bowel sounds. No hepatomegaly. No rebound/guarding. No obvious abdominal masses. Msk:  Strength and tone appear normal for age. Extremities: No clubbing or cyanosis. No edema.  Distal pedal pulses are 2+ and equal bilaterally. Skin: Warm and Dry Neuro: Alert and oriented X 3. CN III-XII intact Grossly normal sensory and motor function . Psych:  Responds to questions  appropriately with a normal affect. 4 extremity tremor      Labs: Cardiac Enzymes  Basename 03/04/12 1404 03/04/12 0907 03/04/12 0234 03/03/12 1814  CKTOTAL -- -- 56 --  CKMB -- -- 1.6 --  TROPONINI <0.30 <0.30 <0.30 <0.30   CBC Lab Results  Component Value Date   WBC 8.0 03/04/2012   HGB 13.4 03/04/2012   HCT 39.5 03/04/2012   MCV 98.0 03/04/2012   PLT 147* 03/04/2012   PROTIME: No results found for this basename: LABPROT:3,INR:3 in the last 72 hours Chemistry  Lab 03/04/12 0235  NA 142  K 4.1  CL 106  CO2 27  BUN 9  CREATININE 0.75  CALCIUM 9.7  PROT 7.0  BILITOT 0.3  ALKPHOS 48  ALT 10  AST 16  GLUCOSE 92   Lipids Lab Results  Component Value Date   CHOL 196 03/04/2012   HDL 77 03/04/2012   LDLCALC 91 03/04/2012   TRIG 141 03/04/2012   BNP No results found for this basename: probnp   Miscellaneous No results found for this basename: DDIMER    Radiology/Studies:  No results found.  EKG: sinus with narrow QRS and intermittent heart block with what appears to be MBZ1 and 2:1 but also unassociated QRD as well  Heart block worsens with walking  Assessment and Plan:  Patient Active Hospital Problem List: Heart block: prob infra hisian given response to walking notwithstanding the narrow QRS  She is very symptomatic with prog exercise intolerance and no reversible factors have been identified.  Will check LV function Will check CXR for hilar adenopathy, Lyme Titers and thallium imaging for infiltrative processes  The benefits and risks were reviewed including but not limited to death,  perforation, infection, lead dislodgement and device malfunction.  The patient understands agrees and is willing to proceed.  We discussed the possibility of MRI compatible devices, but she had declined because of excess perforation risk        Sherryl Manges

## 2012-03-06 NOTE — Progress Notes (Signed)
Echocardiogram 2D Echocardiogram has been performed.  Lindsey Bryant 03/06/2012, 11:36 AM

## 2012-03-06 NOTE — Progress Notes (Signed)
Quick Note:  Pt informed on home VM ______ 

## 2012-03-07 ENCOUNTER — Inpatient Hospital Stay (HOSPITAL_COMMUNITY): Payer: Medicare Other

## 2012-03-07 ENCOUNTER — Encounter: Payer: Self-pay | Admitting: *Deleted

## 2012-03-07 DIAGNOSIS — Z950 Presence of cardiac pacemaker: Secondary | ICD-10-CM

## 2012-03-07 HISTORY — DX: Presence of cardiac pacemaker: Z95.0

## 2012-03-07 MED ORDER — YOU HAVE A PACEMAKER BOOK
Freq: Once | Status: DC
  Filled 2012-03-07: qty 1

## 2012-03-07 NOTE — Progress Notes (Signed)
   SUBJECTIVE:  Feels good.  No pain.   PHYSICAL EXAM Filed Vitals:   03/06/12 2009 03/06/12 2320 03/07/12 0029 03/07/12 0334  BP: 134/83 153/93  134/96  Pulse: 76 67  73  Temp: 97.9 F (36.6 C) 97.6 F (36.4 C)  97.7 F (36.5 C)  TempSrc: Oral Oral  Oral  Resp: 15 15  16   Height:      Weight:   138 lb 0.1 oz (62.6 kg)   SpO2: 100% 99%  100%   General:  No distress Lungs:  Clear Heart:  RRR Abdomen:  Positive bowel sounds, no rebound no guarding Extremities:  No edema Chest:  Wound OK.    LABS: Lab Results  Component Value Date   CKTOTAL 56 03/04/2012   CKMB 1.6 03/04/2012   TROPONINI <0.30 03/04/2012   Results for orders placed during the hospital encounter of 03/03/12 (from the past 24 hour(s))  PROTIME-INR     Status: Normal   Collection Time   03/06/12  9:48 AM      Component Value Range   Prothrombin Time 12.7  11.6 - 15.2 seconds   INR 0.96  0.00 - 1.49    Intake/Output Summary (Last 24 hours) at 03/07/12 0817 Last data filed at 03/07/12 0600  Gross per 24 hour  Intake    990 ml  Output    650 ml  Net    340 ml    ASSESSMENT AND PLAN:  Heart block:  Now s/p pacemaker.  OK to discharge.    Parkinson's disease:  She will call Dr. Sandria Manly to discuss her Artane.  She will remain off of it for now.      Fayrene Fearing [facility] 03/07/2012 8:17 AM

## 2012-03-07 NOTE — Discharge Summary (Signed)
CARDIOLOGY DISCHARGE SUMMARY   Patient ID: Lindsey Bryant MRN: [id number] DOB/AGE: 70-Sep-1942 70 y.o.  Admit date: 03/03/2012 Discharge date: 03/07/2012  Primary Discharge Diagnosis:  Symptomatic bradycardia - status post Medtronic permanent pacemaker Secondary Discharge Diagnosis:  Parkinson's disease Irritable bowel syndrome  Consults: Electrophysiology  Procedures: 2-D echocardiogram, insertion of a dual-lead permanent pacemaker  Hospital Course: [name] is a 71 year old female with no previous cardiac issues. She had increasing dyspnea with exertion and her ECG showed significant bradycardia. Her primary care physician advised her to come to the emergency room where she was admitted for further evaluation and treatment.  Her cardiac enzymes are negative for MI. An echocardiogram showed preserved left ventricular function, results listed below. She had no significant abnormalities on other labs. She went in and out of sinus rhythm, a junctional rhythm and complete heart block. She was felt to be symptomatic with this and a pacemaker was indicated. An electrophysiology consult was called. At one point, the Artane she was taking for her Parkinson's disease had been discontinued as a potential cause of her symptoms. Dr. Graciela Husbands did not feel the Artane was causing her bradycardia. Therefore, consideration was given to restarting it. However the patient felt that she would rather continue to hold the Artane, and follow up with Dr. Sandria Manly. She was continued on her IBS medications with when necessary treatment for symptoms. She was seen by Dr. Graciela Husbands who recommended a permanent pacemaker. She was taken to the lab for this on 03/06/2012.  She had successful insertion of a Medtronic Adapta dual lead permanent pacemaker without complication. She tolerated the procedure well. On 03/07/2012, a chest x-ray did not show pneumothorax or other abnormality. Her ECG was ventricular pacing with normal pacemaker  function. She was evaluated by Dr. Antoine Poche and considered stable for discharge, to follow up as an outpatient.  Labs:   Lab Results  Component Value Date   WBC 8.0 03/04/2012   HGB 13.4 03/04/2012   HCT 39.5 03/04/2012   MCV 98.0 03/04/2012   PLT 147* 03/04/2012    Lab 03/04/12 0235  NA 142  K 4.1  CL 106  CO2 27  BUN 9  CREATININE 0.75  CALCIUM 9.7  PROT 7.0  BILITOT 0.3  ALKPHOS 48  ALT 10  AST 16  GLUCOSE 92    Basename 03/04/12 1404 03/04/12 0907  CKTOTAL -- --  CKMB -- --  CKMBINDEX -- --  TROPONINI <0.30 <0.30   Lipid Panel     Component Value Date/Time   CHOL 196 03/04/2012 0235   TRIG 141 03/04/2012 0235   HDL 77 03/04/2012 0235   CHOLHDL 2.5 03/04/2012 0235   VLDL 28 03/04/2012 0235   LDLCALC 91 03/04/2012 0235     Basename 03/06/12 0948  INR 0.96      Radiology: Dg Chest 2 View  03/07/2012  *RADIOLOGY REPORT*  Clinical Data: Post pacemaker implantation  CHEST - 2 VIEW  Comparison: None.  Findings:  Normal cardiac silhouette and mediastinal contours.  Left anterior chest wall dual lead pacemaker tip overlies the expected location of the right atrium and ventricle.  The lungs are hyperexpanded. No focal parenchymal opacities.  No definite evidence of edema.  No pleural effusion or pneumothorax.  Unchanged bones.  IMPRESSION: Appropriately positioned left anterior chest wall pacemaker without evidence of complication.   Original Report Authenticated By: Waynard Reeds, M.D.    EKG: 07-Mar-2012 03:42:24  Atrial-sensed ventricular-paced rhythm Abnormal ECG 32mm/s 58mm/mV 100Hz  8.0.1 12SL  241 HD CID: 1 Referred by: JAMES HOCHREIN Unconfirmed Vent. rate 65 BPM PR interval 152 ms QRS duration 166 ms QT/QTc 480/499 ms P-R-T axes 18 -37 105  Echo: 03/06/2012  Study Conclusions Left ventricle: The cavity size was normal. Systolic function was normal. The estimated ejection fraction was in the range of 55% to 60%. Wall motion was normal; there  were no regional wall motion abnormalities.    FOLLOW UP PLANS AND APPOINTMENTS No Known Allergies   Medication List     As of 03/07/2012  9:00 AM    TAKE these medications         aspirin 81 MG tablet   Take 81 mg by mouth daily.      fish oil-omega-3 fatty acids 1000 MG capsule   Take 2 g by mouth daily.      medroxyPROGESTERone 5 MG tablet   Commonly known as: PROVERA   Take 2.5 mg by mouth daily.      PREMARIN 0.3 MG tablet   Generic drug: estrogens (conjugated)   Take 1 tablet by mouth daily.      vitamin E 400 UNIT capsule   Generic drug: vitamin E   Take 400 Units by mouth daily.            Discharge Orders    Future Appointments: Provider: Department: Dept Phone: Center:   03/16/2012 11:30 AM [facility] [phone] [facility]   06/05/2012 8:15 AM [name], MD [facility] [phone] [facility]   06/06/2012 10:30 AM Duke Salvia, MD [facility] [phone] [facility]     Future Orders Please Complete By Expires   Diet - low sodium heart healthy      Increase activity slowly        Follow-up Information    Follow up with [facility] On 03/16/2012. (Wound check at 11:30 am)    Contact information:   [address] [place] [address]       Follow up with Sherryl Manges, MD. On 06/06/2012. (at 10:30 am)    Contact information:   [address] [place] [address] [phone]          BRING ALL MEDICATIONS WITH YOU TO FOLLOW UP APPOINTMENTS  Time spent with patient to include physician time: 35 min Signed: Theodore Demark 03/07/2012, 9:00 AM Co-Sign MD

## 2012-03-10 ENCOUNTER — Telehealth: Payer: Self-pay | Admitting: Internal Medicine

## 2012-03-10 NOTE — Telephone Encounter (Signed)
Need to make sure she is not having diaphragmatic stimulation. No hiccups? Can she see the chest wall moving? Is it better with turning. She may try a little advil. If it persists/worsens, or if she has shortness of breath, she will need to go to the ER for probably CXR and maybe even a limed echo.

## 2012-03-10 NOTE — Telephone Encounter (Signed)
When called patient back she stated she had not had any of the pain under her left breast in over an hour, but she had been lying down.  She denied any shortness of breath, but was having some discomfort behind he left shoulder. Denies hiccups or chest wall movement that could tell.  Patient was insisting that she did not want to go to the emergency room and wanted to see Dr Graciela Husbands.  There was an appointment open for Monday and did schedule however I explained that if she continue to have symptoms, worsening in symptoms, shortness of breath, or any other problems she needed to go to the emergency department.  Patient again stated she did not want to do that because she didn't think she could handle the waiting and the process was too long. Strongly encouraged her to go if any changes.  Patient verbalized understanding.

## 2012-03-10 NOTE — Telephone Encounter (Signed)
Pt had device placement and having some pain wants to know if normal

## 2012-03-10 NOTE — Telephone Encounter (Signed)
Started having pain under left breast yesterday off and on.  Pain is not at the pacer site, there is a rash around it but patient thinks that is from the tape.  Patient states pain is more frequently now and has gone from a 3 to 7 out of 10. Pain only last for about a minute, comes every 15 minutes.  Patient denies any shortness of breath, nausea, diaphoresis, or any other symptoms.  No streaks coming from site. Will forward to Dawayne Patricia NP for review

## 2012-03-13 ENCOUNTER — Ambulatory Visit (INDEPENDENT_AMBULATORY_CARE_PROVIDER_SITE_OTHER): Payer: Medicare Other | Admitting: Internal Medicine

## 2012-03-13 ENCOUNTER — Encounter: Payer: Self-pay | Admitting: Internal Medicine

## 2012-03-13 ENCOUNTER — Encounter (HOSPITAL_COMMUNITY): Payer: Self-pay | Admitting: Family Medicine

## 2012-03-13 ENCOUNTER — Ambulatory Visit (HOSPITAL_BASED_OUTPATIENT_CLINIC_OR_DEPARTMENT_OTHER): Payer: Medicare Other

## 2012-03-13 ENCOUNTER — Inpatient Hospital Stay (HOSPITAL_COMMUNITY): Payer: Medicare Other

## 2012-03-13 ENCOUNTER — Inpatient Hospital Stay (HOSPITAL_COMMUNITY)
Admission: AD | Admit: 2012-03-13 | Discharge: 2012-03-16 | DRG: 261 | Disposition: A | Discharge status: Home / Self Care | Payer: Medicare Other | Source: Ambulatory Visit | Attending: Internal Medicine | Admitting: Internal Medicine

## 2012-03-13 VITALS — BP 137/83 | HR 45 | Ht 61.5 in | Wt 132.0 lb

## 2012-03-13 DIAGNOSIS — Y831 Surgical operation with implant of artificial internal device as the cause of abnormal reaction of the patient, or of later complication, without mention of misadventure at the time of the procedure: Secondary | ICD-10-CM | POA: Diagnosis present

## 2012-03-13 DIAGNOSIS — K589 Irritable bowel syndrome without diarrhea: Secondary | ICD-10-CM | POA: Diagnosis present

## 2012-03-13 DIAGNOSIS — I441 Atrioventricular block, second degree: Secondary | ICD-10-CM | POA: Diagnosis present

## 2012-03-13 DIAGNOSIS — I442 Atrioventricular block, complete: Secondary | ICD-10-CM | POA: Diagnosis present

## 2012-03-13 DIAGNOSIS — I5189 Other ill-defined heart diseases: Secondary | ICD-10-CM | POA: Diagnosis present

## 2012-03-13 DIAGNOSIS — Z85820 Personal history of malignant melanoma of skin: Secondary | ICD-10-CM

## 2012-03-13 DIAGNOSIS — Z950 Presence of cardiac pacemaker: Secondary | ICD-10-CM | POA: Insufficient documentation

## 2012-03-13 DIAGNOSIS — T82190A Other mechanical complication of cardiac electrode, initial encounter: Principal | ICD-10-CM | POA: Diagnosis present

## 2012-03-13 DIAGNOSIS — I319 Disease of pericardium, unspecified: Secondary | ICD-10-CM | POA: Diagnosis present

## 2012-03-13 DIAGNOSIS — G20A1 Parkinson's disease without dyskinesia, without mention of fluctuations: Secondary | ICD-10-CM | POA: Diagnosis present

## 2012-03-13 DIAGNOSIS — G20 Parkinson's disease: Secondary | ICD-10-CM | POA: Diagnosis present

## 2012-03-13 DIAGNOSIS — R072 Precordial pain: Secondary | ICD-10-CM | POA: Insufficient documentation

## 2012-03-13 DIAGNOSIS — I459 Conduction disorder, unspecified: Secondary | ICD-10-CM

## 2012-03-13 LAB — COMPREHENSIVE METABOLIC PANEL
Albumin: 3.6 g/dL (ref 3.5–5.2)
Alkaline Phosphatase: 49 U/L (ref 39–117)
BUN: 10 mg/dL (ref 6–23)
Calcium: 9.5 mg/dL (ref 8.4–10.5)
Creatinine, Ser: 0.69 mg/dL (ref 0.50–1.10)
GFR calc Af Amer: >90 mL/min (ref >90)
Glucose, Bld: 93 mg/dL (ref 70–99)
Total Protein: 6.8 g/dL (ref 6.0–8.3)

## 2012-03-13 LAB — CBC WITH DIFFERENTIAL/PLATELET
Basophils Relative: 0 % (ref 0–1)
Eosinophils Absolute: 0.1 10*3/uL (ref 0.0–0.7)
Lymphs Abs: 2 10*3/uL (ref 0.7–4.0)
MCH: 33.1 pg (ref 26.0–34.0)
Neutro Abs: 6.3 10*3/uL (ref 1.7–7.7)
Neutrophils Relative %: 70 % (ref 43–77)
Platelets: 148 10*3/uL — ABNORMAL LOW (ref 150–400)
RBC: 3.93 MIL/uL (ref 3.87–5.11)

## 2012-03-13 LAB — PROTIME-INR
INR: 1.02 (ref 0.00–1.49)
Prothrombin Time: 13.3 seconds (ref 11.6–15.2)

## 2012-03-13 MED ORDER — ALPRAZOLAM 0.25 MG PO TABS
0.2500 mg | ORAL_TABLET | Freq: Two times a day (BID) | ORAL | Status: DC | PRN
  Administered 2012-03-14: 0.25 mg via ORAL
  Filled 2012-03-13: qty 1

## 2012-03-13 MED ORDER — ACETAMINOPHEN 325 MG PO TABS: 650.0000 mg | ORAL_TABLET | ORAL | Status: DC | PRN

## 2012-03-13 MED ORDER — ONDANSETRON HCL 4 MG/2ML IJ SOLN: 4.0000 mg | Freq: Four times a day (QID) | INTRAMUSCULAR | Status: DC | PRN

## 2012-03-13 MED ORDER — ZOLPIDEM TARTRATE 5 MG PO TABS: 5.0000 mg | ORAL_TABLET | Freq: Every evening | ORAL | Status: DC | PRN

## 2012-03-13 MED ORDER — ASPIRIN 81 MG PO TABS: 81.0000 mg | ORAL_TABLET | Freq: Every day | ORAL | Status: DC

## 2012-03-13 MED ORDER — SODIUM CHLORIDE 0.9 % IJ SOLN
3.0000 mL | Freq: Two times a day (BID) | INTRAMUSCULAR | Status: DC
  Administered 2012-03-13: 3 mL via INTRAVENOUS

## 2012-03-13 MED ORDER — ASPIRIN EC 81 MG PO TBEC
81.0000 mg | DELAYED_RELEASE_TABLET | Freq: Every day | ORAL | Status: DC
Start: 2012-03-14 — End: 2012-03-14
  Administered 2012-03-14: 81 mg via ORAL
  Filled 2012-03-13: qty 1

## 2012-03-13 MED ORDER — MEDROXYPROGESTERONE ACETATE 2.5 MG PO TABS
2.5000 mg | ORAL_TABLET | Freq: Every day | ORAL | Status: DC
  Administered 2012-03-13 – 2012-03-15 (×3): 2.5 mg via ORAL
  Filled 2012-03-13 (×4): qty 1

## 2012-03-13 MED ORDER — ESTROGENS CONJUGATED 0.3 MG PO TABS
0.3000 mg | ORAL_TABLET | Freq: Every day | ORAL | Status: DC
  Administered 2012-03-13 – 2012-03-15 (×3): 0.3 mg via ORAL
  Filled 2012-03-13 (×4): qty 1

## 2012-03-13 MED ORDER — SODIUM CHLORIDE 0.9 % IV SOLN: 250.0000 mL | INTRAVENOUS | Status: DC | PRN

## 2012-03-13 MED ORDER — SODIUM CHLORIDE 0.9 % IJ SOLN: 3.0000 mL | INTRAMUSCULAR | Status: DC | PRN

## 2012-03-13 MED ORDER — NITROGLYCERIN 0.4 MG SL SUBL: 0.4000 mg | SUBLINGUAL_TABLET | SUBLINGUAL | Status: DC | PRN

## 2012-03-13 NOTE — Progress Notes (Addendum)
Spoke with pt/husband. She is weak, but otherwise doing OK.  Labs/Xray pending. Echo done at office.  Made pt NPO after midnight, further orders by EP in am.   They have questions 1) will entire device be removed - don't think so, only 1 lead is a problem  2) can they remove/replace lead - probably so, will have to make final decision during procedure  3) she had significant nausea after d/c  - will order phenergan, give a few pills at discharge  4) will they use the same incision - probably so  5) who will do procedure and when - not sure, RN can call in am to see, think Dr Johney Frame.  Otherwise, no questions, concerns.  Bjorn Loser Barrett 03/13/2012 5:55 PM

## 2012-03-13 NOTE — Patient Instructions (Addendum)
After ECHO, please go directly to [facility].

## 2012-03-13 NOTE — Progress Notes (Signed)
Patient Care Team: [name], MD as PCP - General   HPI  Lindsey Bryant is a 71 y.o. female Seen following pacemaker implantation earlier this month for bradycardia and heart block with a narrow QRS that worsened with exercise. She comes in today with chest pain and complains about slow heart rates.  This started the date after discharge  Past Medical History  Diagnosis Date  . Parkinson's disease   . IBS (irritable bowel syndrome)   . Melanoma     right arm  . Bradycardia     a. 02/2012    Past Surgical History  Procedure Date  . 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 Melanoma excision     right arm  . Appendectomy     Current Outpatient Prescriptions  Medication Sig Dispense Refill  . aspirin 81 MG tablet Take 81 mg by mouth daily.      . medroxyPROGESTERone (PROVERA) 5 MG tablet Take 2.5 mg by mouth daily.       Marland Kitchen PREMARIN 0.3 MG tablet Take 1 tablet by mouth daily.        No Known Allergies  Review of Systems negative except from HPI and PMH  Physical Exam BP 137/83  Pulse 45  Ht 5' 1.5" (1.562 m)  Wt 132 lb (59.875 kg)  BMI 24.54 kg/m2 Well developed and well nourished in no acute distress HENT normal E scleral and icterus clear Neck Supple JVP<6 carotids brisk and full Clear to ausculation No rub  Regular rate and rhythm, no murmurs gallops or rub Soft with active bowel sounds No clubbing cyanosis no Edema Alert and oriented, grossly normal motor and sensory function Skin Warm and Dry Electrocardiogram demonstrates sinus rhythm with intermittent RV failure to capture   Assessment and  Plan

## 2012-03-13 NOTE — Assessment & Plan Note (Signed)
She has RV failure to capture even at maximum output. She is having chest pain. This combination suggests to me that there is microperforation as opposed to dislodgment of the lead sitting in the ventricle. We'll obtain an echo to make sure there is no significant effusion although her physical examination demonstrates no signs of elevated jugular venous pressure to suggest an effusion and tender not physiology. We will continue chest x-rays to look at the lead. She'll undergo revision tomorrow

## 2012-03-13 NOTE — Progress Notes (Signed)
Echocardiogram performed.  

## 2012-03-14 ENCOUNTER — Encounter (HOSPITAL_COMMUNITY)
Admission: AD | Disposition: A | Discharge status: Home / Self Care | Payer: Self-pay | Source: Ambulatory Visit | Attending: Internal Medicine

## 2012-03-14 DIAGNOSIS — T82190A Other mechanical complication of cardiac electrode, initial encounter: Secondary | ICD-10-CM

## 2012-03-14 DIAGNOSIS — I441 Atrioventricular block, second degree: Secondary | ICD-10-CM

## 2012-03-14 HISTORY — PX: PACEMAKER REVISION: SHX5482

## 2012-03-14 SURGERY — PACEMAKER REVISION
Anesthesia: LOCAL

## 2012-03-14 MED ORDER — ACETAMINOPHEN 325 MG PO TABS
325.0000 mg | ORAL_TABLET | ORAL | Status: DC | PRN
  Administered 2012-03-14 – 2012-03-15 (×3): 650 mg via ORAL
  Filled 2012-03-14 (×3): qty 2

## 2012-03-14 MED ORDER — MIDAZOLAM HCL 5 MG/5ML IJ SOLN
INTRAMUSCULAR | Status: AC
  Filled 2012-03-14: qty 5

## 2012-03-14 MED ORDER — SODIUM CHLORIDE 0.9 % IJ SOLN
3.0000 mL | Freq: Two times a day (BID) | INTRAMUSCULAR | Status: DC
  Administered 2012-03-14 – 2012-03-15 (×2): 3 mL via INTRAVENOUS

## 2012-03-14 MED ORDER — LIDOCAINE HCL (PF) 1 % IJ SOLN
INTRAMUSCULAR | Status: AC
  Filled 2012-03-14: qty 60

## 2012-03-14 MED ORDER — SODIUM CHLORIDE 0.45 % IV SOLN
INTRAVENOUS | Status: DC
  Administered 2012-03-14: 12:00:00 via INTRAVENOUS

## 2012-03-14 MED ORDER — CEFAZOLIN SODIUM 1-5 GM-% IV SOLN
1.0000 g | Freq: Four times a day (QID) | INTRAVENOUS | Status: AC
  Administered 2012-03-14 – 2012-03-15 (×3): 1 g via INTRAVENOUS
  Filled 2012-03-14 (×3): qty 50

## 2012-03-14 MED ORDER — ONDANSETRON HCL 4 MG/2ML IJ SOLN: 4.0000 mg | Freq: Four times a day (QID) | INTRAMUSCULAR | Status: DC | PRN

## 2012-03-14 MED ORDER — SODIUM CHLORIDE 0.9 % IJ SOLN: 3.0000 mL | INTRAMUSCULAR | Status: DC | PRN

## 2012-03-14 MED ORDER — CEFAZOLIN SODIUM-DEXTROSE 2-3 GM-% IV SOLR
2.0000 g | INTRAVENOUS | Status: DC
  Filled 2012-03-14: qty 50

## 2012-03-14 MED ORDER — CHLORHEXIDINE GLUCONATE 4 % EX LIQD
60.0000 mL | Freq: Once | CUTANEOUS | Status: AC
  Administered 2012-03-14: 4 via TOPICAL
  Filled 2012-03-14: qty 60

## 2012-03-14 MED ORDER — HYDROCODONE-ACETAMINOPHEN 5-325 MG PO TABS
1.0000 | ORAL_TABLET | ORAL | Status: DC | PRN
Start: 2012-03-14 — End: 2012-03-16

## 2012-03-14 MED ORDER — HEPARIN (PORCINE) IN NACL 2-0.9 UNIT/ML-% IJ SOLN
INTRAMUSCULAR | Status: AC
  Filled 2012-03-14: qty 500

## 2012-03-14 MED ORDER — SODIUM CHLORIDE 0.9 % IV SOLN: 250.0000 mL | INTRAVENOUS | Status: DC | PRN

## 2012-03-14 MED ORDER — FENTANYL CITRATE 0.05 MG/ML IJ SOLN
INTRAMUSCULAR | Status: AC
  Filled 2012-03-14: qty 2

## 2012-03-14 MED ORDER — PHENYLEPHRINE HCL 10 MG/ML IJ SOLN
INTRAMUSCULAR | Status: AC
  Filled 2012-03-14: qty 1

## 2012-03-14 MED ORDER — SODIUM CHLORIDE 0.9 % IR SOLN
80.0000 mg | Status: DC
  Filled 2012-03-14: qty 2

## 2012-03-14 NOTE — H&P (View-Only) (Signed)
ELECTROPHYSIOLOGY ROUNDING NOTE    Patient Name: Lindsey Bryant Date of Encounter: 03-14-2012    SUBJECTIVE:Patient still with chest pain same as yesterday and over the weekend.  X-ray demonstrates RV lead pulled back a little.  Plan for RV lead revision today.   TELEMETRY: Reviewed telemetry pt in high grade heart block, rates 40's Filed Vitals:   03/13/12 1710 03/13/12 1946 03/13/12 2108 03/14/12 0330  BP:  140/60 161/68 149/67  Pulse: 83 44 65 68  Temp: 97.4 F (36.3 C)  97.9 F (36.6 C) 97.8 F (36.6 C)  TempSrc: Oral  Oral Oral  Resp:  20 20 18   Height: 5' 1.5" (1.562 m)     Weight: 130 lb 14.4 oz (59.376 kg)     SpO2:  98% 96% 97%    Intake/Output Summary (Last 24 hours) at 03/14/12 0809 Last data filed at 03/14/12 0700  Gross per 24 hour  Intake    240 ml  Output   1850 ml  Net  -1610 ml    LABS: Basic Metabolic Panel:  Basename 03/13/12 1743  NA 138  K 4.2  CL 101  CO2 26  GLUCOSE 93  BUN 10  CREATININE 0.69  CALCIUM 9.5  MG --  PHOS --   Liver Function Tests:  Basename 03/13/12 1743  AST 16  ALT 17  ALKPHOS 49  BILITOT 0.4  PROT 6.8  ALBUMIN 3.6   CBC:  Basename 03/13/12 1743  WBC 9.0  NEUTROABS 6.3  HGB 13.0  HCT 37.6  MCV 95.7  PLT 148*    Radiology/Studies: X-ray Chest Pa And Lateral 03/13/2012  *RADIOLOGY REPORT*  Clinical Data: Shortness of breath and weakness.  Dyspnea on exertion.  CHEST - 2 VIEW  Comparison: 03/07/2012  Findings: The patient has a left-sided transvenous pacemaker with leads to the right atrium, right ventricle.  Heart size is normal. The lungs are free of focal consolidations and pleural effusions. No edema.  Mid thoracic degenerative changes are present.  IMPRESSION: No evidence for acute cardiopulmonary abnormality.   Original Report Authenticated By: Patterson Hammersmith, M.D.     Physical Exam: Filed Vitals:   03/13/12 1710 03/13/12 1946 03/13/12 2108 03/14/12 0330  BP:  140/60 161/68 149/67  Pulse: 83  44 65 68  Temp: 97.4 F (36.3 C)  97.9 F (36.6 C) 97.8 F (36.6 C)  TempSrc: Oral  Oral Oral  Resp:  20 20 18   Height: 5' 1.5" (1.562 m)     Weight: 130 lb 14.4 oz (59.376 kg)     SpO2:  98% 96% 97%    GEN- The patient is well appearing, alert and oriented x 3 today.   Head- normocephalic, atraumatic Eyes-  Sclera clear, conjunctiva pink Ears- hearing intact Oropharynx- clear Neck- supple, no JVP Lymph- no cervical lymphadenopathy Lungs- Clear to ausculation bilaterally, normal work of breathing Heart- Regular rate and rhythm, no murmurs, rubs or gallops, PMI not laterally displaced GI- soft, NT, ND, + BS Extremities- no clubbing, cyanosis, or edema   DEVICE INTERROGATION: Device interrogated in office 03-13-2012, no RV capture   Assessment and Plan:  Mobitz II second degree AV block Pt with dislodged RV lead.  Echo reveals no effusion.  She has slow V rates presently and requires urgent lead revision. Risks, benefits, alternatives to pacemaker system revision were discussed in detail with the patient today. The patient understands that the risks include but are not limited to bleeding, infection, pneumothorax, perforation, tamponade, vascular damage,  renal failure, MI, stroke, death,  and lead dislodgement and wishes to proceed. We will therefore schedule the procedure at the next available time.

## 2012-03-14 NOTE — Progress Notes (Signed)
Utilization Review Completed.Lindsey Bryant T10/22/2013   

## 2012-03-14 NOTE — Progress Notes (Signed)
 ELECTROPHYSIOLOGY ROUNDING NOTE    Patient Name: Lindsey Bryant Date of Encounter: 03-14-2012    SUBJECTIVE:Patient still with chest pain same as yesterday and over the weekend.  X-ray demonstrates RV lead pulled back a little.  Plan for RV lead revision today.   TELEMETRY: Reviewed telemetry pt in high grade heart block, rates 40's Filed Vitals:   03/13/12 1710 03/13/12 1946 03/13/12 2108 03/14/12 0330  BP:  140/60 161/68 149/67  Pulse: 83 44 65 68  Temp: 97.4 F (36.3 C)  97.9 F (36.6 C) 97.8 F (36.6 C)  TempSrc: Oral  Oral Oral  Resp:  20 20 18  Height: 5' 1.5" (1.562 m)     Weight: 130 lb 14.4 oz (59.376 kg)     SpO2:  98% 96% 97%    Intake/Output Summary (Last 24 hours) at 03/14/12 0809 Last data filed at 03/14/12 0700  Gross per 24 hour  Intake    240 ml  Output   1850 ml  Net  -1610 ml    LABS: Basic Metabolic Panel:  Basename 03/13/12 1743  NA 138  K 4.2  CL 101  CO2 26  GLUCOSE 93  BUN 10  CREATININE 0.69  CALCIUM 9.5  MG --  PHOS --   Liver Function Tests:  Basename 03/13/12 1743  AST 16  ALT 17  ALKPHOS 49  BILITOT 0.4  PROT 6.8  ALBUMIN 3.6   CBC:  Basename 03/13/12 1743  WBC 9.0  NEUTROABS 6.3  HGB 13.0  HCT 37.6  MCV 95.7  PLT 148*    Radiology/Studies: X-ray Chest Pa And Lateral 03/13/2012  *RADIOLOGY REPORT*  Clinical Data: Shortness of breath and weakness.  Dyspnea on exertion.  CHEST - 2 VIEW  Comparison: 03/07/2012  Findings: The patient has a left-sided transvenous pacemaker with leads to the right atrium, right ventricle.  Heart size is normal. The lungs are free of focal consolidations and pleural effusions. No edema.  Mid thoracic degenerative changes are present.  IMPRESSION: No evidence for acute cardiopulmonary abnormality.   Original Report Authenticated By: ELIZABETH D. BROWN, M.D.     Physical Exam: Filed Vitals:   03/13/12 1710 03/13/12 1946 03/13/12 2108 03/14/12 0330  BP:  140/60 161/68 149/67  Pulse: 83  44 65 68  Temp: 97.4 F (36.3 C)  97.9 F (36.6 C) 97.8 F (36.6 C)  TempSrc: Oral  Oral Oral  Resp:  20 20 18  Height: 5' 1.5" (1.562 m)     Weight: 130 lb 14.4 oz (59.376 kg)     SpO2:  98% 96% 97%    GEN- The patient is well appearing, alert and oriented x 3 today.   Head- normocephalic, atraumatic Eyes-  Sclera clear, conjunctiva pink Ears- hearing intact Oropharynx- clear Neck- supple, no JVP Lymph- no cervical lymphadenopathy Lungs- Clear to ausculation bilaterally, normal work of breathing Heart- Regular rate and rhythm, no murmurs, rubs or gallops, PMI not laterally displaced GI- soft, NT, ND, + BS Extremities- no clubbing, cyanosis, or edema   DEVICE INTERROGATION: Device interrogated in office 03-13-2012, no RV capture   Assessment and Plan:  Mobitz II second degree AV block Pt with dislodged RV lead.  Echo reveals no effusion.  She has slow V rates presently and requires urgent lead revision. Risks, benefits, alternatives to pacemaker system revision were discussed in detail with the patient today. The patient understands that the risks include but are not limited to bleeding, infection, pneumothorax, perforation, tamponade, vascular damage,   renal failure, MI, stroke, death,  and lead dislodgement and wishes to proceed. We will therefore schedule the procedure at the next available time.   

## 2012-03-14 NOTE — Progress Notes (Signed)
Doing well s/p RV lead revision.  Her old RV lead had perforated and was removed.  She is doing well at this time. She remains hemodynamically stable.  Limited bedside echo by me at this time reveals a very small pericardial effusion.  There is no tamponade physiology.  We will monitor closely overnight for any symptoms of hypotension/ tamponade. I will repeat echo in the am.    Please feel free to call me with questions or concerns overnight.  Toby Breithaupt,MD 6:20 PM 03/14/2012

## 2012-03-14 NOTE — Interval H&P Note (Signed)
History and Physical Interval Note:  03/14/2012 12:44 PM  Lindsey Bryant  has presented today for surgery, with the diagnosis of lead out of place  The various methods of treatment have been discussed with the patient and family. After consideration of risks, benefits and other options for treatment, the patient has consented to  Procedure(s) (LRB) with comments: PACEMAKER REVISION (N/A) as a surgical intervention .  The patient's history has been reviewed, patient examined, no change in status, stable for surgery.  I have reviewed the patient's chart and labs.  Questions were answered to the patient's satisfaction.     Hillis Range

## 2012-03-14 NOTE — H&P (Signed)
Patient Care Team: Lindsey Magnus, MD as PCP - General   HPI  Lindsey Bryant is a 71 y.o. female Seen following pacemaker implantation earlier this month for bradycardia and heart block with a narrow QRS that worsened with exercise. She comes in today with chest pain and complains about slow heart rates.  This started the date after discharge  Past Medical History  Diagnosis Date  . Parkinson's disease   . IBS (irritable bowel syndrome)   . Melanoma     right arm  . Bradycardia     a. 02/2012    Past Surgical History  Procedure Date  . 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 Melanoma excision     right arm  . Appendectomy     Current Facility-Administered Medications  Medication Dose Route Frequency Provider Last Rate Last Dose  . 0.45 % sodium chloride infusion   Intravenous Continuous Hillis Range, MD      . 0.9 %  sodium chloride infusion  250 mL Intravenous PRN Joline Salt Barrett, PA      . acetaminophen (TYLENOL) tablet 650 mg  650 mg Oral Q4H PRN Darrol Jump, PA      . ALPRAZolam Prudy Feeler) tablet 0.25 mg  0.25 mg Oral BID PRN Joline Salt Barrett, PA      . aspirin EC tablet 81 mg  81 mg Oral Daily Duke Salvia, MD   81 mg at 03/14/12 1036  . ceFAZolin (ANCEF) IVPB 2 g/50 mL premix  2 g Intravenous On Call Hillis Range, MD      . chlorhexidine (HIBICLENS) 4 % liquid 4 application  60 mL Topical Once Hillis Range, MD      . chlorhexidine (HIBICLENS) 4 % liquid 4 application  60 mL Topical Once Hillis Range, MD      . estrogens (conjugated) (PREMARIN) tablet 0.3 mg  0.3 mg Oral Daily Rhonda G Barrett, PA   0.3 mg at 03/14/12 1037  . gentamicin (GARAMYCIN) 80 mg in sodium chloride irrigation 0.9 % 500 mL irrigation  80 mg Irrigation On Call Hillis Range, MD      . medroxyPROGESTERone (PROVERA) tablet 2.5 mg  2.5 mg Oral Daily Joline Salt Barrett, PA   2.5 mg at 03/14/12 1037  . nitroGLYCERIN (NITROSTAT) SL tablet 0.4 mg  0.4 mg Sublingual Q5 Min x 3 PRN  Joline Salt Barrett, PA      . ondansetron (ZOFRAN) injection 4 mg  4 mg Intravenous Q6H PRN Rhonda G Barrett, PA      . sodium chloride 0.9 % injection 3 mL  3 mL Intravenous Q12H Joline Salt Barrett, PA   3 mL at 03/13/12 2042  . sodium chloride 0.9 % injection 3 mL  3 mL Intravenous PRN Joline Salt Barrett, PA      . zolpidem (AMBIEN) tablet 5 mg  5 mg Oral QHS PRN Darrol Jump, PA      . DISCONTD: aspirin tablet 81 mg  81 mg Oral Daily Rhonda G Barrett, PA        No Known Allergies  Review of Systems negative except from HPI and PMH  Physical Exam BP 149/67  Pulse 68  Temp 97.8 F (36.6 C) (Oral)  Resp 18  Ht 5' 1.5" (1.562 m)  Wt 130 lb 14.4 oz (59.376 kg)  BMI 24.33 kg/m2  SpO2 97% Well developed and well nourished in no acute distress HENT normal E scleral and  icterus clear Neck Supple JVP<6 carotids brisk and full Clear to ausculation No rub  Regular rate and rhythm, no murmurs gallops or rub Soft with active bowel sounds No clubbing cyanosis no Edema Alert and oriented, grossly normal motor and sensory function Skin Warm and Dry Electrocardiogram demonstrates sinus rhythm with intermittent RV failure to capture   Assessment and  Plan   the patient has ventricular lead failure. With pain suspect perforation. (By chest x-ray, it is now clear, the following morning, but there is retraction of the lead). She will be admitted and undergo lead revision the morning. Risks and benefits have been discussed.

## 2012-03-14 NOTE — Brief Op Note (Signed)
03/13/2012 - 03/14/2012  4:09 PM  PATIENT:  Lindsey Bryant  71 y.o. female  PRE-OPERATIVE DIAGNOSIS:  Right ventricular lead dislodgement  POST-OPERATIVE DIAGNOSIS:  Right ventricular lead dislodgement with myocardial perforation  PROCEDURE:  Procedure(s) (LRB) with comments: PACEMAKER REVISION (N/A)  SURGEON:  Surgeon(s) and Role:    * Hillis Range, MD - Primary  PHYSICIAN ASSISTANT:   ASSISTANTS: none   ANESTHESIA:   local  EBL:  Total I/O In: -  Out: 600 [Urine:600]  BLOOD ADMINISTERED:none  DRAINS: none   LOCAL MEDICATIONS USED:  LIDOCAINE   SPECIMEN:  No Specimen  DISPOSITION OF SPECIMEN:  N/A  COUNTS:  YES  TOURNIQUET:  * No tourniquets in log *  DICTATION: .Other Dictation: Dictation Number [id number]   Delay start of Pharmacological VTE agent (>24hrs) due to surgical blood loss or risk of bleeding: yes

## 2012-03-15 ENCOUNTER — Inpatient Hospital Stay (HOSPITAL_COMMUNITY): Payer: Medicare Other

## 2012-03-15 DIAGNOSIS — I459 Conduction disorder, unspecified: Secondary | ICD-10-CM

## 2012-03-15 DIAGNOSIS — I319 Disease of pericardium, unspecified: Secondary | ICD-10-CM

## 2012-03-15 DIAGNOSIS — Z950 Presence of cardiac pacemaker: Secondary | ICD-10-CM

## 2012-03-15 MED ORDER — ALUM & MAG HYDROXIDE-SIMETH 200-200-20 MG/5ML PO SUSP
15.0000 mL | Freq: Once | ORAL | Status: AC
  Administered 2012-03-15: 15 mL via ORAL
  Filled 2012-03-15: qty 30

## 2012-03-15 NOTE — Progress Notes (Addendum)
  Echocardiogram 2D Echocardiogram limited has been performed.  Kolina Kube 03/15/2012, 8:25 AM

## 2012-03-15 NOTE — Progress Notes (Signed)
   ELECTROPHYSIOLOGY ROUNDING NOTE    Patient Name: Lindsey Bryant Date of Encounter: 03-15-2012    SUBJECTIVE:Patient feels well.  No chest pain or shortness of breath.  S/p RV lead revision 03-14-2012.    TELEMETRY: Reviewed telemetry pt in sinus rhythm with ventricular pacing Filed Vitals:   03/14/12 2300 03/15/12 0005 03/15/12 0341 03/15/12 0500  BP: 135/75 110/73 147/71   Pulse: 62 66 62   Temp:  98 F (36.7 C) 98.1 F (36.7 C)   TempSrc:  Oral Oral   Resp: 14 17 17    Height:      Weight:    131 lb 2.8 oz (59.5 kg)  SpO2:  99% 97%     Intake/Output Summary (Last 24 hours) at 03/15/12 0710 Last data filed at 03/15/12 0500  Gross per 24 hour  Intake    240 ml  Output   2025 ml  Net  -1785 ml   Physical Exam: GEN- The patient is well appearing, alert and oriented x 3 today.   Head- normocephalic, atraumatic Eyes-  Sclera clear, conjunctiva pink Ears- hearing intact Oropharynx- clear Neck- supple  Lungs- Clear to ausculation bilaterally, normal work of breathing Heart- Regular rate and rhythm, no murmurs, rubs or gallops, PMI not laterally displaced GI- soft, NT, ND, + BS Extremities- no clubbing, cyanosis, or edema  LABS: Basic Metabolic Panel:  Basename 03/13/12 1743  NA 138  K 4.2  CL 101  CO2 26  GLUCOSE 93  BUN 10  CREATININE 0.69  CALCIUM 9.5  MG --  PHOS --   Liver Function Tests:  Basename 03/13/12 1743  AST 16  ALT 17  ALKPHOS 49  BILITOT 0.4  PROT 6.8  ALBUMIN 3.6   CBC:  Basename 03/13/12 1743  WBC 9.0  NEUTROABS 6.3  HGB 13.0  HCT 37.6  MCV 95.7  PLT 148*     Radiology/Studies:  Stable leads, no pneuthorax  Echo: stable small effusion  DEVICE INTERROGATION: Device interrogated by industry.  Lead values including impedence, sensing, threshold within normal values.    Assessment and Plan:   1. Mobitz II second degree AV block Normal pacemaker function No change today  2. Lead perforation Success lead  repositioning Stable small effusion by echo Transfer to telemetry Probably to home tomorrow  Jarold Song

## 2012-03-15 NOTE — Op Note (Signed)
Lindsey Bryant, TEGTMEIER                 ACCOUNT NO.:  [id number]  MEDICAL RECORD NO.:  [id number]  LOCATION:  2919                         FACILITY:  [facility]  PHYSICIAN:  Hillis Range, MD       DATE OF BIRTH:  1941-01-26  DATE OF PROCEDURE: DATE OF DISCHARGE:                              OPERATIVE REPORT   PREPROCEDURE DIAGNOSES: 1. Mobitz II second-degree AV block. 2. Right ventricular pacemaker lead dislodgement with perforation of     the myocardium.  POSTPROCEDURE DIAGNOSES: 1. Mobitz II second-degree AV block. 2. Right ventricular pacemaker lead dislodgement with perforation of     the myocardium.  PROCEDURES: 1. Pacemaker interrogation and reprogramming. 2. Pacemaker lead removal. 3. Pacemaker lead placement.  INTRODUCTION:  Ms. Schaible is a pleasant 71 year old female with recently diagnosed Mobitz II second-degree AV block.  She presented and underwent pacemaker implantation on March 06, 2012.  She did well initially following pacemaker implantation.  Unfortunately upon followup in the office yesterday, she was found to have loss of capture from her right ventricular lead.  She also complained of left-sided chest pain.  She was admitted to [facility] emergently.  Overnight she was observed to have Mobitz II second-degree AV block. She now presents for pacemaker revision.  DESCRIPTION OF PROCEDURE:  Informed written consent was obtained and the patient was brought to the electrophysiology lab in a fasting state. She required no sedation for the procedure today.  The patient's left chest was prepped and draped in the usual sterile fashion by the EP lab staff.  Skin overlying her existing pacemaker was infiltrated with lidocaine for local analgesia.  A 4-cm incision was made over the existing pacemaker pocket.  The pacemaker was then exposed using a combination of sharp and blunt dissection.  A single silk stitch was identified securing the pacemaker to the pocket and this was  removed. Fluoroscopy over the patient's pacemaker leads was then performed.  This demonstrated a suitable position of the right atrial lead along the right atrial appendage.  The right ventricular lead had perforated through the myocardium and was several centimeters beyond the cardiac silhouette.  The ventricular lead was disconnected from the pacemaker. The silk suture over the suture sleeve for the ventricular lead were identified and removed.  The ventricular lead was confirmed to be a Medtronic model [id number] (serial number [id number]) lead implanted March 06, 2012.  A stylet was placed into the lead.  The active helix was unfixed using standard technique.  Access to the left subclavian vein was then performed using a percutaneous Seldinger technique with fluoroscopic visualization.  Through the left subclavian vein, a Medtronic model [phone] (serial number [id number]) lead was advanced with fluoroscopic visualization into the right ventricular apex position.  In this location, R-waves measured 9.8 mV with impedance of 820 ohms and a threshold of 0.5 V at 0.5 milliseconds.  This lead was then secured to the pectoralis fascia using #2 silk sutures over the suture sleeves.  The atrial lead was examined thoroughly and its integrity was confirmed to be intact.  The atrial lead was a Medtronic model [id number] (serial number [id number]) lead implanted March 06, 2012.  P-waves  measured 2.4 millivolts with an impedance of 568 ohms and a threshold of 0.5 V at 0.4 milliseconds.  I therefore elected to keep this lead in its current position.  The previously implanted Medtronic model 5076 lead was then withdrawn with gentle pressure.  The lead easily was pulled back into the heart. The patient was followed very closely and hemodynamics remained stable. The left heart border was examined and remained contractile.  At this point, the ventricular lead was gently removed from the body with  gentle withdrawal technique.  Once the lead had been fully removed, direct manual pressure was placed over the previous access site.  The pocket was then irrigated with copious gentamicin solution.  The new Medtronic model [phone] lead was attached to the previously implanted Medtronic Adapta model ADDRL1 (serial number [id number]) device.  The pocket was irrigated with copious gentamicin solution.  The pacemaker was then returned to the pocket.  The pocket was then closed in two layers with 2- 0 Vicryl suture for the subcutaneous and subcuticular layers.  Steri- Strips and a sterile dressing were then applied.  The patient remained hemodynamically stable.  A limited bedside transthoracic echocardiogram was performed, which revealed no pericardial effusion.  There were no early apparent complications.  The patient will be transferred to the step-down unit for close observation overnight.  CONCLUSIONS: 1. Mobitz II second-degree AV block. 2. Right ventricular pacemaker lead dislodgement with perforation. 3. Successful right ventricular lead removal with replacement with a     Medtronic model 5092 lead. 4. No early apparent complications.     Hillis Range, MD     JA/MEDQ  D:  03/14/2012  T:  03/15/2012  Job:  [id number]  cc:   Duke Salvia, MD, [facility]

## 2012-03-16 ENCOUNTER — Ambulatory Visit: Payer: Medicare Other

## 2012-03-16 NOTE — Progress Notes (Signed)
   ELECTROPHYSIOLOGY ROUNDING NOTE    Patient Name: Lindsey Bryant Date of Encounter: 03-16-2012    SUBJECTIVE:Patient feels well.  Stronger today.  No chest pain or shortness of breath.  S/p RV lead revision 03-14-2012.  Heart burn, but familiar  Non positional TELEMETRY: Reviewed telemetry pt in sinus rhythm with ventricular pacing Filed Vitals:   03/15/12 1039 03/15/12 1400 03/15/12 2100 03/16/12 0500  BP: 129/90 128/86 118/76 115/76  Pulse: 62 68 74 70  Temp: 97.5 F (36.4 C) 97.8 F (36.6 C) 98.1 F (36.7 C) 97.9 F (36.6 C)  TempSrc:  Oral    Resp: 16 16 16 18   Height:      Weight:      SpO2: 97% 98% 96% 94%    Intake/Output Summary (Last 24 hours) at 03/16/12 0650 Last data filed at 03/15/12 2244  Gross per 24 hour  Intake    423 ml  Output    150 ml  Net    273 ml    LABS: Basic Metabolic Panel:  Basename 03/13/12 1743  NA 138  K 4.2  CL 101  CO2 26  GLUCOSE 93  BUN 10  CREATININE 0.69  CALCIUM 9.5  MG --  PHOS --   Liver Function Tests:  Basename 03/13/12 1743  AST 16  ALT 17  ALKPHOS 49  BILITOT 0.4  PROT 6.8  ALBUMIN 3.6   CBC:  Basename 03/13/12 1743  WBC 9.0  NEUTROABS 6.3  HGB 13.0  HCT 37.6  MCV 95.7  PLT 148*    Radiology/Studies:  Dg Chest 2 View 03/15/2012  *RADIOLOGY REPORT*  Clinical Data: Post revision of pacemaker  CHEST - 2 VIEW  Comparison: Chest x-ray of 03/13/2012  Findings: The lungs remain clear but somewhat hyperaerated. Mediastinal contours are stable.  Permanent pacemaker is unchanged.  IMPRESSION: No active lung disease.  Permanent pacemaker remains.   Original Report Authenticated By: Juline Patch, M.D.    PHYSICAL EXAM Left chest without hematoma or ecchymosis. Well developed and nourished in no acute distress HENT normal Neck supple with JVP-flat Clear Regular rate and rhythm, no murmurs or gallops no rub Abd-soft with active BS No Clubbing cyanosis edema Skin-warm and dry A & Oriented  Grossly  normal sensory and motor function   ECHO:  03-15-2012-- small pericardial effusion, not circumferential.   Wound care, arm mobility, restrictions reviewed with patient.    Routine follow up scheduled.

## 2012-03-20 ENCOUNTER — Telehealth: Payer: Self-pay | Admitting: Internal Medicine

## 2012-03-20 NOTE — Telephone Encounter (Signed)
Dr. Caryl Never, please see pt note below. Thank you.

## 2012-03-20 NOTE — Telephone Encounter (Signed)
Spoke with pt and she is improving with regard to overall strength.

## 2012-03-20 NOTE — Telephone Encounter (Signed)
Caller: Lindsey Bryant/Patient; Patient Name: Lindsey Bryant; PCP: Birdie Sons (Adults only); Best Callback Phone Number: [phone]. Pt states she was seen by Dr. Caryl Never approx 2 weeks ago and advised to go to ED. Surgery 10/14 and 10/22.  She wants to thank Dr. Caryl Never for taking such good care of her; she relates he would not know what happened to her otherwise.  She would like to speak with Dr. Caryl Never personally at his leisure to tell him about her experience.  Note to office for review.  PCP Calls, No Triage protocol used.

## 2012-03-21 ENCOUNTER — Encounter: Payer: Self-pay | Admitting: Internal Medicine

## 2012-03-23 ENCOUNTER — Ambulatory Visit (INDEPENDENT_AMBULATORY_CARE_PROVIDER_SITE_OTHER): Payer: Medicare Other | Admitting: *Deleted

## 2012-03-23 DIAGNOSIS — I459 Conduction disorder, unspecified: Secondary | ICD-10-CM

## 2012-03-23 LAB — PACEMAKER DEVICE OBSERVATION
AL AMPLITUDE: 2 mv
AL IMPEDENCE PM: 503 Ohm
BAMS-0001: 155 {beats}/min
BATTERY VOLTAGE: 2.79 V
RV LEAD AMPLITUDE: 15.67 mv

## 2012-03-23 NOTE — Progress Notes (Signed)
Wound check-PPM 

## 2012-04-12 ENCOUNTER — Telehealth: Payer: Self-pay | Admitting: Internal Medicine

## 2012-04-12 NOTE — Telephone Encounter (Signed)
New Problem:    Patient called in wanting to know the serial number of the most recent ICD implant leads she had placed form her second surgery.  Please call back.

## 2012-04-12 NOTE — Telephone Encounter (Signed)
Will forward to the device clinic 

## 2012-04-12 NOTE — Telephone Encounter (Signed)
Spoke with patient and verified model and serial #'s for both device and leads.  ID card is correct.

## 2012-04-24 ENCOUNTER — Encounter: Payer: Self-pay | Admitting: Internal Medicine

## 2012-04-28 NOTE — Discharge Summary (Signed)
ELECTROPHYSIOLOGY DISCHARGE SUMMARY    Patient ID: Lindsey Bryant,  MRN: [id number], DOB/AGE: 01/05/1941 71 y.o.  Admit date: 03/14/2012 Discharge date: 03/16/2012  Primary Care Physician: Lindsey Sons, MD Primary Cardiologist: Lindsey Husbands, MD  Primary Discharge Diagnosis:  1. RV lead dislodgement with perforation of the myocardium s/p PPM lead revision 2. Small pericardial effusion 3. Mobitz II second degree AV block s/p recent PPM impantation  Secondary Discharge Diagnoses:  1. Parkinsons disease 2. Irritable bowel syndrome 3. History of melanoma  Procedures This Admission: 1. Limited transthoracic echocardiogram 03/15/2012 Study Conclusions Impressions: Limited echo to R/O pericardial effusion. EF is normal. There is a small pericardial effusion. Not circumferential. No doppler done but IVC is flat. 2. PPM lead revision 03/15/2012 - pacemaker interrogation and reprogramming, pacemaker lead removal, pacemaker lead placement - see details below The ventricular lead was confirmed to be a Medtronic model [id number] (serial number [id number]) lead implanted March 06, 2012. A stylet was placed into the lead. The active helix was unfixed using standard technique. Access to the left subclavian vein was then performed using a percutaneous Seldinger technique with fluoroscopic visualization. Through the left subclavian vein, a Medtronic model [id number] (serial number [id number]) lead was advanced with fluoroscopic visualization into the right ventricular apex position. In this location, R-waves measured 9.8 mV with impedance of 820 ohms and a threshold of 0.5 V at 0.5 milliseconds.  CONCLUSIONS:  1. Mobitz II second-degree AV block.  2. Right ventricular pacemaker lead dislodgement with perforation.  3. Successful right ventricular lead removal with replacement with a  Medtronic model [id number] lead.  4. No early apparent complications.  History and Hospital Course:  Please see admission H&P for full  details. Briefly, Lindsey Bryant is a pleasant 71 year old woman with recently diagnosed Mobitz II second-degree AV block. She underwent dual chamber pacemaker implantation on March 06, 2012. She did well initially following pacemaker implantation. Unfortunately at her office follow-up visit, she was found to have loss of capture involving the right ventricular lead. She also complained of left-sided chest pain. She was admitted to [facility] emergently. An echocardiogram revealed a small pericardial effusion, not circumferential. Overnight she was observed to have Mobitz II second-degree AV block. She then underwent pacemaker revision on 03/15/2012. Lindsey Bryant tolerated this procedure well without any immediate complication. She remains hemodynamically stable and afebrile. Her chest xray shows stable lead placement without pneumothorax. Her device interrogation was reviewed by Dr. Graciela Bryant and shows normal PPM function with stable lead parameters/measurements. Her implant site is intact without significant bleeding or hematoma. She has been given discharge instructions including wound care and activity restrictions. She will follow-up in 10 days for wound check. There were no changes made to her medications. She has been seen, examined and deemed stable for discharge today by Dr. Berton Mount.  Discharge Vitals: Blood pressure 115/76, pulse 70, temperature 97.9 F (36.6 C), temperature source Oral, resp. rate 18, height 5' 1.5" (1.562 m), weight 131 lb 2.8 oz (59.5 kg), SpO2 94.00%.   Labs: Lab Results  Component Value Date   WBC 9.0 03/13/2012   HGB 13.0 03/13/2012   HCT 37.6 03/13/2012   MCV 95.7 03/13/2012   PLT 148* 03/13/2012    03/13/2012 - Sodium 138, potassium 4.2, BUN 10, Cr 0.69, AST 16, ALT 17, alk phos 49, albumin 3.6    Disposition:  The patient is being discharged in stable condition.  Follow-up: Follow-up Information    Follow up with [facility]. On 03/23/2012.  (  12:00)    Contact information:   [address] [place] [address] [phone]      Follow up with Sherryl Manges, MD. On 06/20/2012. (2:15 PM)    Contact information:   [address] [place] [address] [phone]        Discharge Medications:    Medication List     As of 04/28/2012 10:49 AM    TAKE these medications         aspirin EC 81 MG tablet   Take 81 mg by mouth at bedtime.      estrogens (conjugated) 0.3 MG tablet   Commonly known as: PREMARIN   Take 0.3 mg by mouth at bedtime.      medroxyPROGESTERone 5 MG tablet   Commonly known as: PROVERA   Take 2.5 mg by mouth at bedtime.      VISINE OP   Apply 1 drop to eye daily as needed. For dry eyes      Duration of Discharge Encounter: Greater than 30 minutes including physician time.  Signed, Rick Duff, PA-C 04/28/2012, 10:49 AM

## 2012-05-24 HISTORY — PX: PACEMAKER PLACEMENT: SHX43

## 2012-05-29 ENCOUNTER — Telehealth: Payer: Self-pay | Admitting: Internal Medicine

## 2012-05-29 NOTE — Telephone Encounter (Signed)
Pt is requesting to be seen sooner.  States that she is feeling a "twinge" and she is feeling very tired.  Appt made for 05/30/2012.

## 2012-05-29 NOTE — Telephone Encounter (Signed)
New Problem:    Patient called in wanting to know if her pacemaker was working and wanted to be scheduled to be seen sooner.  Please call back.

## 2012-05-30 ENCOUNTER — Encounter: Payer: Self-pay | Admitting: Internal Medicine

## 2012-05-30 ENCOUNTER — Ambulatory Visit (INDEPENDENT_AMBULATORY_CARE_PROVIDER_SITE_OTHER): Payer: Medicare Other | Admitting: Internal Medicine

## 2012-05-30 VITALS — BP 138/72 | HR 48 | Resp 18 | Ht 62.0 in | Wt 137.0 lb

## 2012-05-30 DIAGNOSIS — R5383 Other fatigue: Secondary | ICD-10-CM | POA: Insufficient documentation

## 2012-05-30 DIAGNOSIS — R5381 Other malaise: Secondary | ICD-10-CM

## 2012-05-30 DIAGNOSIS — Z950 Presence of cardiac pacemaker: Secondary | ICD-10-CM

## 2012-05-30 DIAGNOSIS — I459 Conduction disorder, unspecified: Secondary | ICD-10-CM

## 2012-05-30 DIAGNOSIS — I442 Atrioventricular block, complete: Secondary | ICD-10-CM

## 2012-05-30 LAB — PACEMAKER DEVICE OBSERVATION
ATRIAL PACING PM: 50
BAMS-0001: 155 {beats}/min
BATTERY VOLTAGE: 2.79 V
VENTRICULAR PACING PM: 78

## 2012-05-30 NOTE — Assessment & Plan Note (Signed)
Improved intrinsic conduction with 21% ventricular sensed events. The device was reprogrammed to improve this by increasing AV delay-sensed to 300

## 2012-05-30 NOTE — Assessment & Plan Note (Signed)
,  The patient's device was interrogated and the information was fully reviewed.  The device was reprogrammed to

## 2012-05-30 NOTE — Progress Notes (Signed)
Patient Care Team: [name], MD as PCP - General   HPI  Lindsey Bryant is a 72 y.o. female Seen in followup for pacemaker implantation for bradycardia and heart block in the setting of an early QRS.  Her major complaint is fatigue    Past Medical History  Diagnosis Date  . Parkinson's disease   . IBS (irritable bowel syndrome)   . Melanoma     right arm  . Bradycardia     a. 02/2012  . 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/2012    Past Surgical History  Procedure Date  . 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 Melanoma excision     right arm  . Appendectomy     Current Outpatient Prescriptions  Medication Sig Dispense Refill  . aspirin EC 81 MG tablet Take 81 mg by mouth at bedtime.      . carbidopa-levodopa (SINEMET IR) 25-100 MG per tablet Take 1 tablet by mouth 3 (three) times daily.      Marland Kitchen estrogens, conjugated, (PREMARIN) 0.3 MG tablet Take 0.3 mg by mouth at bedtime.      . medroxyPROGESTERone (PROVERA) 5 MG tablet Take 2.5 mg by mouth at bedtime.        No Known Allergies  Review of Systems negative except from HPI and PMH  Physical Exam BP 138/72  Pulse 48  Resp 18  Ht 5\' 2"  (1.575 m)  Wt 137 lb (62.143 kg)  BMI 25.06 kg/m2  SpO2 93% Well developed and nourished in no acute distress HENT normal Neck supple with JVP-flat Clear Regular rate and rhythm, no murmurs or gallops Abd-soft with active BS No Clubbing cyanosis edema Skin-warm and dry A & Oriented  Grossly normal sensory and motor function Device pocket well healed; without hematoma or erythema     Assessment and  Plan

## 2012-05-30 NOTE — Patient Instructions (Signed)
Your physician wants you to follow-up in: 3 months with Dr. Graciela Husbands. You will receive a reminder letter in the mail two months in advance. If you don't receive a letter, please call our office to schedule the follow-up appointment.  Your physician recommends that you continue on your current medications as directed. Please refer to the Current Medication list given to you today.

## 2012-05-30 NOTE — Assessment & Plan Note (Signed)
The patient has significant fatigue. Is not clear the cause. I don't know what is related to systemic illness I gave rise to her heart block possibly her Parkinson's or her medications. It is also likely related to ventricular pacing as there is left ventricular pacing going on. We will follow this along

## 2012-06-01 ENCOUNTER — Encounter: Payer: Self-pay | Admitting: Internal Medicine

## 2012-06-05 ENCOUNTER — Ambulatory Visit (INDEPENDENT_AMBULATORY_CARE_PROVIDER_SITE_OTHER): Payer: Medicare Other | Admitting: Internal Medicine

## 2012-06-05 ENCOUNTER — Encounter: Payer: BC Managed Care – PPO | Admitting: Internal Medicine

## 2012-06-05 ENCOUNTER — Encounter: Payer: Self-pay | Admitting: Internal Medicine

## 2012-06-05 ENCOUNTER — Telehealth: Payer: Self-pay | Admitting: Internal Medicine

## 2012-06-05 VITALS — BP 142/84 | HR 72 | Temp 98.0°F | Ht 62.0 in | Wt 136.0 lb

## 2012-06-05 DIAGNOSIS — R5381 Other malaise: Secondary | ICD-10-CM

## 2012-06-05 DIAGNOSIS — Z0000 Encounter for general adult medical examination without abnormal findings: Secondary | ICD-10-CM

## 2012-06-05 DIAGNOSIS — R5383 Other fatigue: Secondary | ICD-10-CM

## 2012-06-05 LAB — BASIC METABOLIC PANEL
Chloride: 105 mEq/L (ref 96–112)
Potassium: 4.9 mEq/L (ref 3.5–5.1)
Sodium: 140 mEq/L (ref 135–145)

## 2012-06-05 LAB — LIPID PANEL: HDL: 73.8 mg/dL (ref >39.00)

## 2012-06-05 LAB — CBC WITH DIFFERENTIAL/PLATELET
Basophils Relative: 0.3 % (ref 0.0–3.0)
Eosinophils Absolute: 0.1 10*3/uL (ref 0.0–0.7)
Hemoglobin: 13.8 g/dL (ref 12.0–15.0)
MCHC: 33.5 g/dL (ref 30.0–36.0)
MCV: 98.7 fl (ref 78.0–100.0)
Monocytes Absolute: 0.4 10*3/uL (ref 0.1–1.0)
Neutro Abs: 4.3 10*3/uL (ref 1.4–7.7)
RBC: 4.16 Mil/uL (ref 3.87–5.11)

## 2012-06-05 LAB — HEPATIC FUNCTION PANEL
ALT: 15 U/L (ref 0–35)
AST: 16 U/L (ref 0–37)
Bilirubin, Direct: 0 mg/dL (ref 0.0–0.3)
Total Bilirubin: 0.6 mg/dL (ref 0.3–1.2)

## 2012-06-05 NOTE — Telephone Encounter (Signed)
Pt called on 11/3 since her revision she has electrical impulses they where wondering if she has been seen since then and wondering do we have any advice regarding this problem

## 2012-06-05 NOTE — Telephone Encounter (Signed)
Patient states she did not call.

## 2012-06-05 NOTE — Progress Notes (Signed)
Patient ID: Lindsey Bryant, female   DOB: [birth year], 72 y.o.   MRN: [id number] cpx  Has some fatigue with change in parkinson's meds  Past Medical History  Diagnosis Date  . Parkinson's disease   . IBS (irritable bowel syndrome)   . Melanoma     right arm  . Bradycardia     a. 02/2012  . 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 Marital Status: Married    Spouse Name: N/A    Number of Children: 2  . Years of Education: N/A   Occupational History  . Not on file.   Social History Main Topics  . Smoking status: Never Smoker   . Smokeless tobacco: Not on file  . Alcohol Use: No  . Drug Use: No  . Sexually Active:    Other Topics Concern  . Not on file   Social History Narrative  . No narrative on file    Past Surgical History  Procedure Date  . 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 Melanoma excision     right arm  . Appendectomy     Family History  Problem Relation Age of Onset  . Heart attack Mother 72    Details unclear  . Alcohol abuse Father   . Diabetes Brother   . Breast cancer      No Known Allergies  Current Outpatient Prescriptions on File Prior to Visit  Medication Sig Dispense Refill  . aspirin EC 81 MG tablet Take 81 mg by mouth at bedtime.      . carbidopa-levodopa (SINEMET IR) 25-100 MG per tablet Take 1 tablet by mouth 3 (three) times daily.      Marland Kitchen estrogens, conjugated, (PREMARIN) 0.3 MG tablet Take 0.3 mg by mouth at bedtime.      . medroxyPROGESTERone (PROVERA) 5 MG tablet Take 2.5 mg by mouth at bedtime.         patient denies chest pain, shortness of breath, orthopnea. Denies lower extremity edema, abdominal pain, change in appetite, change in bowel movements. Patient denies rashes, musculoskeletal complaints. No other specific complaints in a complete review of systems.   BP 142/84  Pulse 72  Temp 98 F (36.7 C) (Oral)  Ht 5\' 2"  (1.575 m)  Wt 136 lb (61.689 kg)  BMI 24.87 kg/m2  Well-developed well-nourished female in no acute distress. HEENT exam atraumatic, normocephalic, extraocular muscles are intact. Neck is supple. No jugular venous distention no thyromegaly. Chest clear to auscultation without increased work of breathing. Cardiac exam S1 and S2 are regular. Abdominal exam active bowel sounds, soft, nontender. Extremities no edema. Neurologic exam she is alert without any motor sensory deficits. Gait is normal. Resting parkinosn tremor.  A/p- well visit Continue same meds i've encouraged her to remain active

## 2012-06-06 ENCOUNTER — Encounter: Payer: Medicare Other | Admitting: Internal Medicine

## 2012-06-14 ENCOUNTER — Telehealth: Payer: Self-pay | Admitting: Internal Medicine

## 2012-06-14 NOTE — Telephone Encounter (Signed)
Patient Information:  Caller Name: Munira  Phone: [phone]  Patient: Lindsey Bryant, Lindsey Bryant  Gender: Female  DOB: [birth year]  Age: 72 Years  PCP: Birdie Sons (Adults only)  Office Follow Up:  Does the office need to follow up with this patient?: No  Instructions For The Office: N/A  RN Note:  Per Standing orders, RN called in RX of Polytrim eye drops to [facility] off [address] [phone]  Symptoms  Reason For Call & Symptoms: caller report yellow mucous in left eye.  Pt also states eye is pink.  Reviewed Health History In EMR: Yes  Reviewed Medications In EMR: Yes  Reviewed Allergies In EMR: Yes  Reviewed Surgeries / Procedures: Yes  Date of Onset of Symptoms: 06/13/2012  Guideline(s) Used:  Eye - Pus or Discharge  Disposition Per Guideline:   Home Care  Reason For Disposition Reached:   Eye with yellow/green discharge or eyelashes stick together, and PCP standing order to call in antibiotic eye drops  Advice Given:  Reassurance:  A small amount of pus or mucus in the inner corner of the eye is often due to a cold or virus. Sometimes it's just a reaction to an irritant in the eye.  Eyelid Cleansing:   Gently wash eyelids and lashes with warm water and wet cotton balls (or cotton gauze). Remove all the dried and liquid pus.  Do this as often as needed.  Contagiousness:  Pinkeye is contagious. It is very easy to spread to other people. You can spread it by shaking hands.  Call Back If  Pus increases in amount  Pus in corner of eye lasts over 3 days  Eyelid becomes red or swollen  You become worse.

## 2012-06-20 ENCOUNTER — Encounter: Payer: Medicare Other | Admitting: Internal Medicine

## 2012-07-27 ENCOUNTER — Telehealth: Payer: Self-pay | Admitting: Internal Medicine

## 2012-07-27 NOTE — Telephone Encounter (Signed)
Pt would like results of  Labs done 06/03/12. Pt would like a call on Friday. She will be out the rest of the day.

## 2012-07-29 NOTE — Telephone Encounter (Signed)
No answer, no machine.

## 2012-07-31 NOTE — Telephone Encounter (Signed)
No answer, no machine.

## 2012-08-03 ENCOUNTER — Telehealth: Payer: Self-pay | Admitting: Internal Medicine

## 2012-08-03 NOTE — Telephone Encounter (Signed)
No answer, no machine, mailed lab results

## 2012-08-03 NOTE — Telephone Encounter (Signed)
Pt never received blood work results from [employer] 2014

## 2012-08-22 ENCOUNTER — Ambulatory Visit (INDEPENDENT_AMBULATORY_CARE_PROVIDER_SITE_OTHER): Payer: Medicare Other | Admitting: Internal Medicine

## 2012-08-22 ENCOUNTER — Encounter: Payer: Self-pay | Admitting: Internal Medicine

## 2012-08-22 VITALS — BP 124/78 | HR 67 | Ht 62.0 in | Wt 140.1 lb

## 2012-08-22 DIAGNOSIS — R0989 Other specified symptoms and signs involving the circulatory and respiratory systems: Secondary | ICD-10-CM

## 2012-08-22 DIAGNOSIS — R0609 Other forms of dyspnea: Secondary | ICD-10-CM

## 2012-08-22 DIAGNOSIS — I442 Atrioventricular block, complete: Secondary | ICD-10-CM

## 2012-08-22 DIAGNOSIS — Z950 Presence of cardiac pacemaker: Secondary | ICD-10-CM

## 2012-08-22 LAB — PACEMAKER DEVICE OBSERVATION
BATTERY VOLTAGE: 2.79 V
VENTRICULAR PACING PM: 82.3

## 2012-08-22 NOTE — Patient Instructions (Signed)
Your physician has requested that you have an exercise tolerance test. For further information please visit [email or web address] Please also follow instruction sheet, as given. (IN 4-5 WEEKS WITH DR Graciela Husbands)  Your physician has requested that you have an echocardiogram. Echocardiography is a painless test that uses sound waves to create images of your heart. It provides your doctor with information about the size and shape of your heart and how well your heart's chambers and valves are working. This procedure takes approximately one hour. There are no restrictions for this procedure.

## 2012-08-22 NOTE — Assessment & Plan Note (Signed)
The patient's device was interrogated and the information was fully reviewed.  The device was reprogrammed As above  

## 2012-08-22 NOTE — Assessment & Plan Note (Signed)
She is increasingly ventricularly paced  Even  with a prolongation of the AV delay. As such we will short AV delay to try to improve AV synchrony realize that she'll go from 82% paced to  100% paced

## 2012-08-22 NOTE — Assessment & Plan Note (Signed)
I am not sure the mechanism of her dyspnea. Evaluation of her histograms demonstrate set 4% of her beats faster than 108 she accomplishes a heart rate of up to 120 beats at least one percent of the time which is within range over 130 beats per minute predicted maximal heart rate  So it may be related to prolonged AV conduction which we have addressed as above. We'll repeat an echo and also put her on the treadmill and see what happens to heart rate with modest exercise. We'll measure oxygen saturation.

## 2012-08-22 NOTE — Progress Notes (Signed)
Patient Care Team: [name], MD as PCP - General   HPI  Lindsey Bryant is a 72 y.o. 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 She continues to complain of exercise intolerance.the fatigue is somewhat better. She still has exercise intolerance which is new compared to before her pacemaker was implanted. She has no edema. She's had no systemic symptoms.  Past Medical History  Diagnosis Date  . Parkinson's disease   . IBS (irritable bowel syndrome)   . Melanoma     right arm  . Bradycardia     a. 02/2012  . 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 Melanoma excision      right arm  . Appendectomy      Current Outpatient Prescriptions  Medication Sig Dispense Refill  . aspirin EC 81 MG tablet Take 81 mg by mouth at bedtime.      . carbidopa-levodopa (SINEMET IR) 25-100 MG per tablet Take 1 tablet by mouth 3 (three) times daily.      Marland Kitchen estrogens, conjugated, (PREMARIN) 0.3 MG tablet Take 0.3 mg by mouth at bedtime.      . medroxyPROGESTERone (PROVERA) 5 MG tablet Take 2.5 mg by mouth at bedtime.       No current facility-administered medications for this visit.    No Known Allergies  Review of Systems negative except from HPI and PMH  Physical Exam BP 124/78  Pulse 67  Ht 5\' 2"  (1.575 m)  Wt 140 lb 1.9 oz (63.558 kg)  BMI 25.62 kg/m2  SpO2 99% Well developed and well nourished in no acute distress HENT normal E scleral and icterus clear Neck Supple JVP  fllat carotids brisk and full Clear to ausculation  Regular rate and rhythm, no murmurs gallops or rub Soft with active bowel sounds No clubbing cyanosis none Edema Alert and oriented, grossly normal motor and sensory function Skin Warm and Dry    Assessment and  Plan

## 2012-09-15 ENCOUNTER — Encounter: Payer: Self-pay | Admitting: Internal Medicine

## 2012-09-22 ENCOUNTER — Other Ambulatory Visit: Payer: Self-pay

## 2012-09-22 ENCOUNTER — Ambulatory Visit (HOSPITAL_COMMUNITY): Payer: Medicare Other | Attending: Internal Medicine | Admitting: Radiology

## 2012-09-22 ENCOUNTER — Ambulatory Visit (INDEPENDENT_AMBULATORY_CARE_PROVIDER_SITE_OTHER): Payer: Medicare Other | Admitting: Internal Medicine

## 2012-09-22 DIAGNOSIS — R0989 Other specified symptoms and signs involving the circulatory and respiratory systems: Secondary | ICD-10-CM | POA: Insufficient documentation

## 2012-09-22 DIAGNOSIS — I442 Atrioventricular block, complete: Secondary | ICD-10-CM

## 2012-09-22 DIAGNOSIS — R0609 Other forms of dyspnea: Secondary | ICD-10-CM | POA: Insufficient documentation

## 2012-09-22 DIAGNOSIS — Z950 Presence of cardiac pacemaker: Secondary | ICD-10-CM | POA: Insufficient documentation

## 2012-09-22 NOTE — Progress Notes (Signed)
Echocardiogram performed.  

## 2012-09-22 NOTE — Procedures (Signed)
Exercise Treadmill Test  Pre-Exercise Testing Evaluation Rhythm: normal sinus  Rate: 70     Test  Exercise Tolerance Test Ordering MD: Sherryl Manges, MD  Interpreting MD: Sherryl Manges, MD  Unique Test No: 1  Treadmill:  1  Indication for ETT: complete heart block  Contraindication to ETT: No   Stress Modality: exercise - treadmill  Cardiac Imaging Performed: nuclear perfusion - technetium 64m   Protocol:  Max BP:  /  Max MPHR (bpm):   85% MPR (bpm):    MPHR obtained (bpm):   % MPHR obtained:    Reached 85% MPHR (min:sec):   Total Exercise Time (min-sec):    Workload in METS:   Borg Scale:   Reason ETT Terminated:      ST Segment Analysis At Rest:  With Exercise:   Other Information Arrhythmia:   Angina during ETT:   Quality of ETT:    ETT Interpretation:    Comments:   Recommendations: Reasonable hr excursion with exercise  Reprogramming device to 120 made things worse

## 2013-03-13 ENCOUNTER — Encounter: Payer: Self-pay | Admitting: Internal Medicine

## 2013-03-13 ENCOUNTER — Ambulatory Visit (INDEPENDENT_AMBULATORY_CARE_PROVIDER_SITE_OTHER): Payer: Medicare Other | Admitting: Internal Medicine

## 2013-03-13 VITALS — BP 122/77 | HR 74 | Ht 62.0 in | Wt 141.8 lb

## 2013-03-13 DIAGNOSIS — I442 Atrioventricular block, complete: Secondary | ICD-10-CM

## 2013-03-13 DIAGNOSIS — R0609 Other forms of dyspnea: Secondary | ICD-10-CM

## 2013-03-13 DIAGNOSIS — Z950 Presence of cardiac pacemaker: Secondary | ICD-10-CM

## 2013-03-13 LAB — PACEMAKER DEVICE OBSERVATION
AL AMPLITUDE: 2.8 mv
AL IMPEDENCE PM: 419 Ohm
AL THRESHOLD: 0.5 V
ATRIAL PACING PM: 40
BATTERY VOLTAGE: 2.79 V
RV LEAD IMPEDENCE PM: 613 Ohm

## 2013-03-13 NOTE — Patient Instructions (Signed)
Remote monitoring is used to monitor your Pacemaker of ICD from home. This monitoring reduces the number of office visits required to check your device to one time per year. It allows [place] to keep an eye on the functioning of your device to ensure it is working properly. You are scheduled for a device check from home on 06/12/2013. You may send your transmission at any time that day. If you have a wireless device, the transmission will be sent automatically. After your physician reviews your transmission, you will receive a postcard with your next transmission date.  Your physician wants you to follow-up in: one year with Lindsey Bryant, PAC.  You will receive a reminder letter in the mail two months in advance. If you don't receive a letter, please call our office to schedule the follow-up appointment.

## 2013-03-13 NOTE — Assessment & Plan Note (Signed)
Stable and device dependent

## 2013-03-13 NOTE — Progress Notes (Signed)
      Patient Care Team: [name], MD as PCP - General   HPI  Lindsey Bryant is a 72 y.o. 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 She continues to complain of exercise intolerance.the fatigue is somewhat better.    LV function EF 5/14 normal  She continues to have problems with exertion particularly walking up hills.   Past Medical History  Diagnosis Date  . Parkinson's disease   . IBS (irritable bowel syndrome)   . Melanoma     right arm  . Bradycardia     a. 02/2012  . 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 Melanoma excision      right arm  . Appendectomy      Current Outpatient Prescriptions  Medication Sig Dispense Refill  . aspirin EC 81 MG tablet Take 81 mg by mouth at bedtime.      . carbidopa-levodopa (SINEMET IR) 25-100 MG per tablet Take 1 tablet by mouth 3 (three) times daily.      Marland Kitchen estrogens, conjugated, (PREMARIN) 0.3 MG tablet Take 0.3 mg by mouth at bedtime.      . medroxyPROGESTERone (PROVERA) 5 MG tablet Take 2.5 mg by mouth at bedtime.       No current facility-administered medications for this visit.    No Known Allergies  Review of Systems negative except from HPI and PMH  Physical Exam BP 122/77  Pulse 74  Ht 5\' 2"  (1.575 m)  Wt 141 lb 12.8 oz (64.32 kg)  BMI 25.93 kg/m2 Well developed and nourished in no acute distress HENT normal Neck supple  Clear Device pocket well healed; without hematoma or erythema.  There is no tethering  Regular rate and rhythm, no murmurs or gallops Abd-soft with active BS No Clubbing cyanosis edema Skin-warm and dry A & Oriented  Grossly normal sensory and motor function tremor    Assessment and  Plan

## 2013-03-13 NOTE — Assessment & Plan Note (Signed)
Continues to be a problem. We discussed the potential issues related to inadequate rate response as well his RV pacing. We discussed using Holter monitor versus treadmill testing to try to improve exercise performance. She is fine for now.

## 2013-03-13 NOTE — Assessment & Plan Note (Signed)
,  The patient's device was interrogated.  The information was reviewed. No changes were made in the programming.    

## 2013-03-20 ENCOUNTER — Encounter: Payer: Self-pay | Admitting: Internal Medicine

## 2013-05-24 LAB — HM MAMMOGRAPHY

## 2013-05-24 LAB — HM PAP SMEAR

## 2013-06-05 ENCOUNTER — Ambulatory Visit (INDEPENDENT_AMBULATORY_CARE_PROVIDER_SITE_OTHER): Payer: Medicare Other | Admitting: Internal Medicine

## 2013-06-05 ENCOUNTER — Encounter: Payer: Self-pay | Admitting: Internal Medicine

## 2013-06-05 VITALS — BP 128/82 | HR 76 | Temp 97.6°F | Ht 63.0 in | Wt 141.0 lb

## 2013-06-05 DIAGNOSIS — Z0000 Encounter for general adult medical examination without abnormal findings: Secondary | ICD-10-CM

## 2013-06-05 DIAGNOSIS — R5381 Other malaise: Secondary | ICD-10-CM

## 2013-06-05 DIAGNOSIS — R5383 Other fatigue: Secondary | ICD-10-CM

## 2013-06-05 DIAGNOSIS — Z136 Encounter for screening for cardiovascular disorders: Secondary | ICD-10-CM

## 2013-06-05 LAB — CBC WITH DIFFERENTIAL/PLATELET
Basophils Absolute: 0 10*3/uL (ref 0.0–0.1)
Basophils Relative: 0.4 % (ref 0.0–3.0)
EOS PCT: 1 % (ref 0.0–5.0)
Eosinophils Absolute: 0.1 10*3/uL (ref 0.0–0.7)
HEMATOCRIT: 39.5 % (ref 36.0–46.0)
HEMOGLOBIN: 13.4 g/dL (ref 12.0–15.0)
LYMPHS ABS: 1.6 10*3/uL (ref 0.7–4.0)
Lymphocytes Relative: 21.2 % (ref 12.0–46.0)
MCHC: 33.8 g/dL (ref 30.0–36.0)
MCV: 98.3 fl (ref 78.0–100.0)
MONO ABS: 0.5 10*3/uL (ref 0.1–1.0)
Monocytes Relative: 6 % (ref 3.0–12.0)
Neutro Abs: 5.4 10*3/uL (ref 1.4–7.7)
Neutrophils Relative %: 71.4 % (ref 43.0–77.0)
PLATELETS: 157 10*3/uL (ref 150.0–400.0)
RBC: 4.02 Mil/uL (ref 3.87–5.11)
RDW: 12.8 % (ref 11.5–14.6)
WBC: 7.6 10*3/uL (ref 4.5–10.5)

## 2013-06-05 LAB — HEPATIC FUNCTION PANEL
ALBUMIN: 4.1 g/dL (ref 3.5–5.2)
ALK PHOS: 42 U/L (ref 39–117)
ALT: 18 U/L (ref 0–35)
AST: 21 U/L (ref 0–37)
BILIRUBIN TOTAL: 0.6 mg/dL (ref 0.3–1.2)
Bilirubin, Direct: 0 mg/dL (ref 0.0–0.3)
Total Protein: 7 g/dL (ref 6.0–8.3)

## 2013-06-05 LAB — BASIC METABOLIC PANEL
BUN: 13 mg/dL (ref 6–23)
CO2: 26 mEq/L (ref 19–32)
Calcium: 9.2 mg/dL (ref 8.4–10.5)
Chloride: 104 mEq/L (ref 96–112)
Creatinine, Ser: 0.9 mg/dL (ref 0.4–1.2)
GFR: 68.97 mL/min (ref >60.00)
Glucose, Bld: 80 mg/dL (ref 70–99)
Potassium: 4 mEq/L (ref 3.5–5.1)
Sodium: 140 mEq/L (ref 135–145)

## 2013-06-05 LAB — POCT URINALYSIS DIPSTICK
Bilirubin, UA: NEGATIVE
Blood, UA: NEGATIVE
GLUCOSE UA: NEGATIVE
Ketones, UA: NEGATIVE
NITRITE UA: NEGATIVE
Protein, UA: NEGATIVE
Spec Grav, UA: 1.01
UROBILINOGEN UA: 0.2
pH, UA: 5.5

## 2013-06-05 LAB — LIPID PANEL
Cholesterol: 198 mg/dL (ref 0–200)
HDL: 79.7 mg/dL (ref >39.00)
LDL Cholesterol: 104 mg/dL — ABNORMAL HIGH (ref 0–99)
Total CHOL/HDL Ratio: 2
Triglycerides: 73 mg/dL (ref 0.0–149.0)
VLDL: 14.6 mg/dL (ref 0.0–40.0)

## 2013-06-05 LAB — TSH: TSH: 2.12 u[IU]/mL (ref 0.35–5.50)

## 2013-06-05 IMAGING — CR DG CHEST 2V
1 series · 1 of 1 positions shown · non-contrast
Comparison: Chest x-ray of 03/13/2012

CLINICAL DATA: Post revision of pacemaker

CHEST - 2 VIEW

[w chest lat]
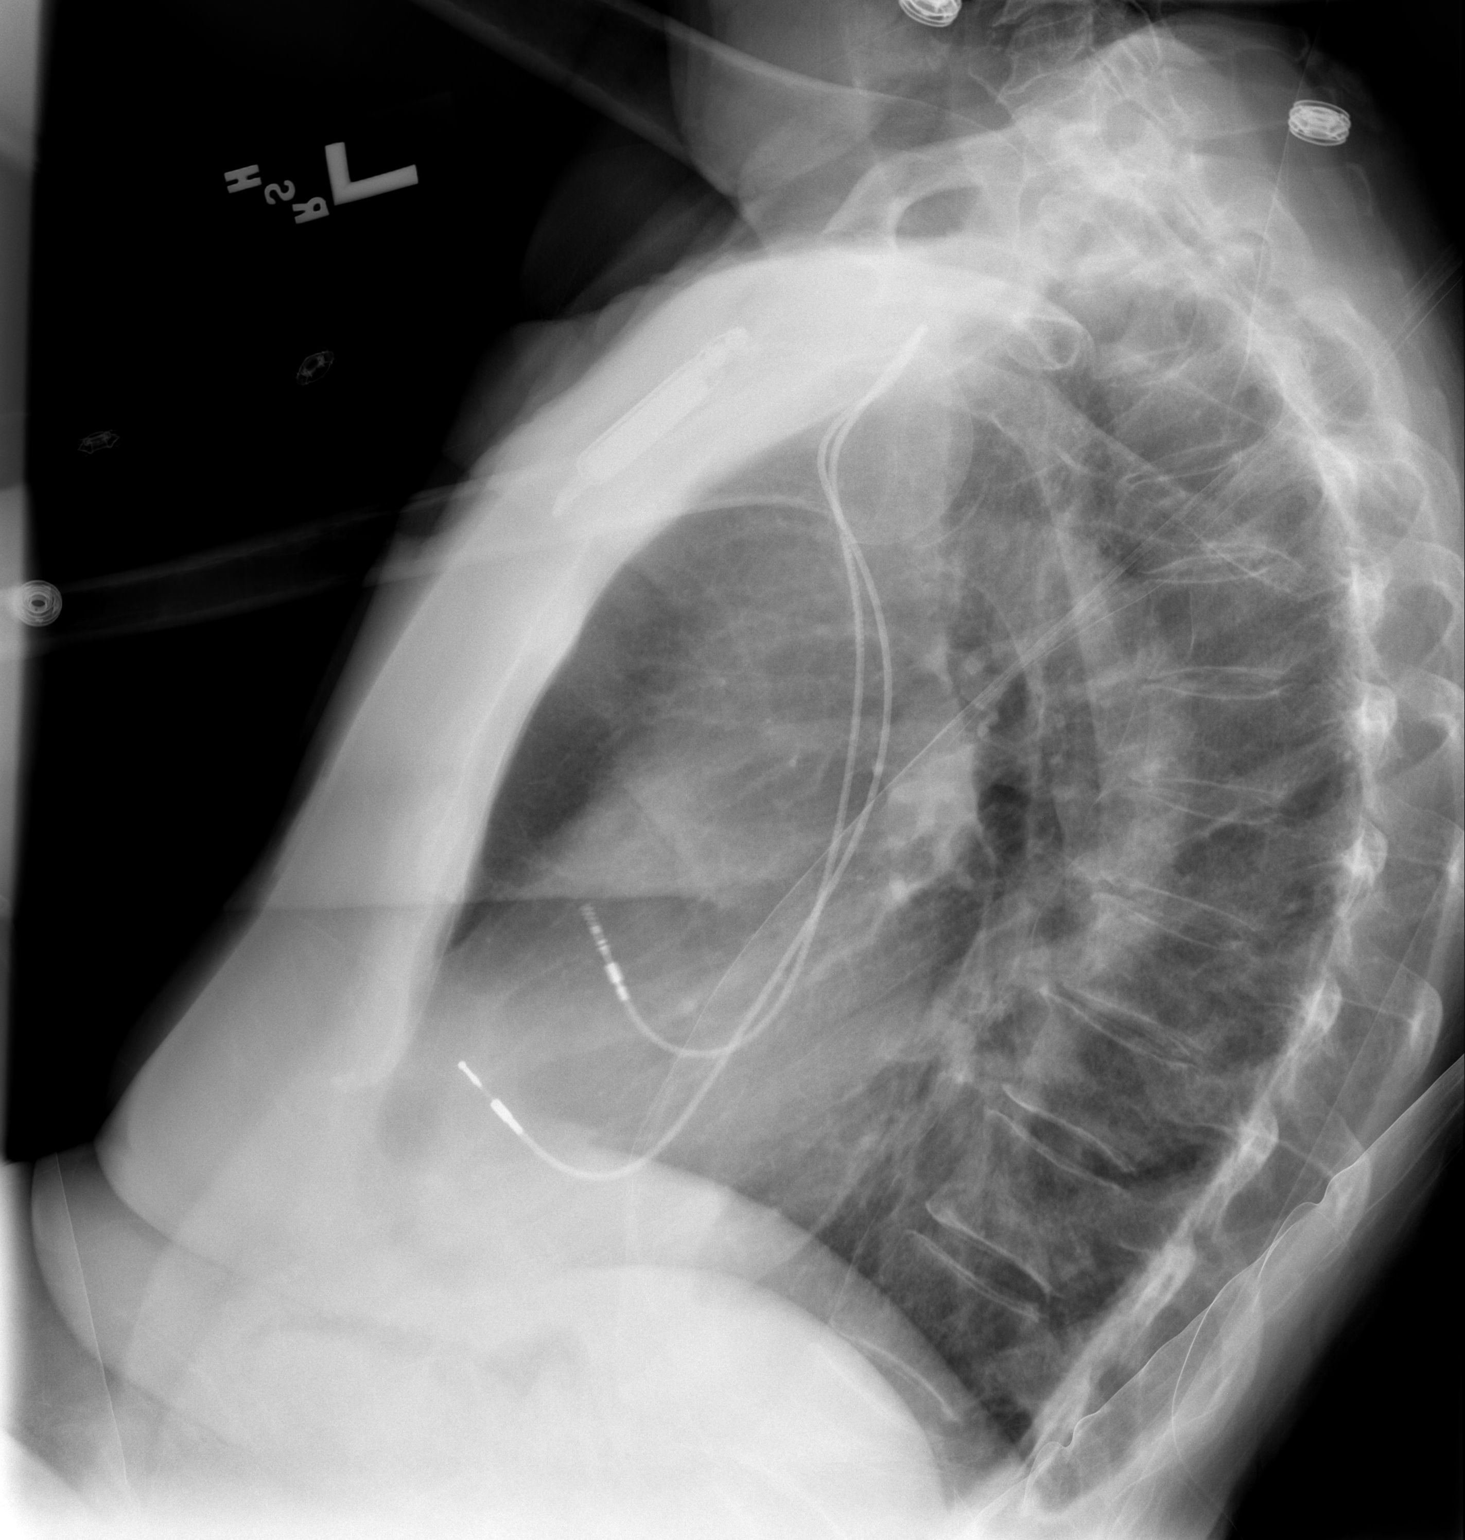

[1 of 1 positions shown; findings below may reference images not displayed]

FINDINGS: The lungs remain clear but somewhat hyperaerated.
Mediastinal contours are stable.  Permanent pacemaker is unchanged.
IMPRESSION: No active lung disease.  Permanent pacemaker remains.

## 2013-06-05 NOTE — Progress Notes (Signed)
CPX  Past Medical History  Diagnosis Date  . Parkinson's disease   . IBS (irritable bowel syndrome)   . Melanoma     right arm  . Bradycardia     a. 02/2012  . 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 Marital Status: Married    Spouse Name: N/A    Number of Children: 2  . Years of Education: N/A   Occupational History  . Not on file.   Social History Main Topics  . Smoking status: Never Smoker   . Smokeless tobacco: Not on file  . Alcohol Use: No  . Drug Use: No  . Sexual Activity:    Other Topics Concern  . Not on file   Social History Narrative  . No narrative on file    Past Surgical History  Procedure Laterality Date  . 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 Melanoma excision      right arm  . Appendectomy      Family History  Problem Relation Age of Onset  . Heart attack Mother 30    Details unclear  . Alcohol abuse Father   . Diabetes Brother   . Breast cancer    . Hypothyroidism Daughter   . Hypothyroidism Daughter     No Known Allergies  Current Outpatient Prescriptions on File Prior to Visit  Medication Sig Dispense Refill  . aspirin EC 81 MG tablet Take 81 mg by mouth at bedtime.      . carbidopa-levodopa (SINEMET IR) 25-100 MG per tablet Take 1 tablet by mouth 3 (three) times daily.      Marland Kitchen estrogens, conjugated, (PREMARIN) 0.3 MG tablet Take 0.3 mg by mouth at bedtime.      . medroxyPROGESTERone (PROVERA) 5 MG tablet Take 2.5 mg by mouth at bedtime.       No current facility-administered medications on file prior to visit.     patient denies chest pain, shortness of breath, orthopnea. Denies lower extremity edema, abdominal pain, change in appetite, change in bowel movements. Patient denies rashes, musculoskeletal complaints. No other specific complaints in a complete review of systems.   BP 128/82  Pulse 76  Temp(Src) 97.6 F (36.4 C) (Oral)  Ht 5\' 3"  (1.6 m)  Wt 141 lb (63.957 kg)  BMI  24.98 kg/m2  Well-developed well-nourished female in no acute distress. HEENT exam atraumatic, normocephalic, extraocular muscles are intact. Neck is supple. No jugular venous distention no thyromegaly. Chest clear to auscultation without increased work of breathing. Cardiac exam S1 and S2 are regular. Abdominal exam active bowel sounds, soft, nontender. Extremities no edema. Neurologic exam she is alert without any motor sensory deficits. Gait is normal.  Well visit- health maint UTD May need colonoscopy- will check with GI She sees dermatology regularly

## 2013-06-05 NOTE — Progress Notes (Signed)
Pre visit review using our clinic review tool, if applicable. No additional management support is needed unless otherwise documented below in the visit note. 

## 2013-06-07 ENCOUNTER — Encounter: Payer: Self-pay | Admitting: Internal Medicine

## 2013-06-08 ENCOUNTER — Encounter: Payer: Self-pay | Admitting: Internal Medicine

## 2013-06-12 ENCOUNTER — Encounter: Payer: Medicare Other | Admitting: *Deleted

## 2013-06-18 ENCOUNTER — Telehealth: Payer: Self-pay | Admitting: *Deleted

## 2013-06-18 DIAGNOSIS — Z0000 Encounter for general adult medical examination without abnormal findings: Secondary | ICD-10-CM

## 2013-06-18 NOTE — Telephone Encounter (Signed)
Message copied by Pearletha Forge on Mon Jun 18, 2013  9:34 AM ------      Message from: Lisabeth Pick      Created: Fri Jun 15, 2013  2:00 PM       She is due for colonoscopy-      Please refer      ----- Message -----         From: Reyne Dumas         Sent: 06/12/2013   8:27 AM           To: Lisabeth Pick, MD            Colonoscopy has been scanned into chart.      ----- Message -----         From: Lisabeth Pick, MD         Sent: 06/05/2013  10:00 AM           To: Reyne Dumas            Colonoscopy from [facility]             ------

## 2013-06-18 NOTE — Telephone Encounter (Signed)
Referral order placed.

## 2013-06-19 ENCOUNTER — Encounter: Payer: Self-pay | Admitting: *Deleted

## 2013-07-17 ENCOUNTER — Encounter: Payer: Self-pay | Admitting: Internal Medicine

## 2013-09-05 ENCOUNTER — Ambulatory Visit (INDEPENDENT_AMBULATORY_CARE_PROVIDER_SITE_OTHER): Payer: Medicare Other | Admitting: Family Medicine

## 2013-09-05 ENCOUNTER — Encounter: Payer: Self-pay | Admitting: Family Medicine

## 2013-09-05 ENCOUNTER — Ambulatory Visit (AMBULATORY_SURGERY_CENTER): Payer: Self-pay | Admitting: *Deleted

## 2013-09-05 VITALS — BP 130/70 | HR 72 | Temp 97.4°F | Wt 143.0 lb

## 2013-09-05 VITALS — Ht 62.0 in | Wt 141.4 lb

## 2013-09-05 DIAGNOSIS — H6982 Other specified disorders of Eustachian tube, left ear: Secondary | ICD-10-CM

## 2013-09-05 DIAGNOSIS — R3915 Urgency of urination: Secondary | ICD-10-CM

## 2013-09-05 DIAGNOSIS — H6980 Other specified disorders of Eustachian tube, unspecified ear: Secondary | ICD-10-CM

## 2013-09-05 DIAGNOSIS — Z1211 Encounter for screening for malignant neoplasm of colon: Secondary | ICD-10-CM

## 2013-09-05 MED ORDER — MIRABEGRON ER 25 MG PO TB24: 25.0000 mg | ORAL_TABLET | Freq: Every day | ORAL | Status: DC

## 2013-09-05 MED ORDER — MOVIPREP 100 G PO SOLR: ORAL | Status: DC

## 2013-09-05 NOTE — Progress Notes (Signed)
Pre visit review using our clinic review tool, if applicable. No additional management support is needed unless otherwise documented below in the visit note. 

## 2013-09-05 NOTE — Progress Notes (Signed)
No allergies to eggs or soy. No problems with anesthesia.  Pt given Emmi instructions for colonoscopy  No oxygen use  No diet drug use  

## 2013-09-05 NOTE — Progress Notes (Signed)
   Subjective:    Patient ID: Lindsey Bryant, female    DOB: 10/07/1940, 73 y.o.   MRN: [id number]  Ear Fullness  Associated symptoms include coughing. Pertinent negatives include no ear discharge or hearing loss.    Patient seen for the following items  She had recent viral URI-type symptoms. She now has bilateral ear pressure right greater than left. She had some initial sinus congestion which is improved. She has not had any hearing loss. No vertigo. Mild ear pain. Mild sore throat initially which is improved. She still has some dry cough.  Patient complains of urinary frequency. No burning. She has nocturia usually x2. She has urinary urgency. No stress incontinence. Symptoms have progressed over the past year or so. She's never had any gross hematuria. No flank pain. Minimal caffeine use. No consistent alcohol use. No clear exacerbating factors.  She has Parkinson's disease followed at [facility].  Past Medical History  Diagnosis Date  . Parkinson's disease   . IBS (irritable bowel syndrome)   . Melanoma     right arm  . Bradycardia     a. 02/2012  . 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 Melanoma excision Right 2000    right arm  . Appendectomy  1952    reports that she has never smoked. She has never used smokeless tobacco. She reports that she does not drink alcohol or use illicit drugs. family history includes Alcohol abuse in her father; Breast cancer in an other family member; Diabetes in her brother; Heart attack (age of onset: 82) in her mother; Hypothyroidism in her daughter and daughter. There is no history of Colon cancer. No Known Allergies   Review of Systems  Constitutional: Negative for fever and chills.  HENT: Positive for congestion and ear pain. Negative for ear discharge, hearing loss and sinus pressure.   Respiratory: Positive for cough.  Negative for shortness of breath and wheezing.   Cardiovascular: Negative for chest pain, palpitations and leg swelling.  Genitourinary: Positive for urgency and frequency. Negative for dysuria and hematuria.  Neurological: Negative for dizziness.       Objective:   Physical Exam  Constitutional: She appears well-developed and well-nourished. No distress.  HENT:  Right Ear: External ear normal.  Left Ear: External ear normal.  Mouth/Throat: Oropharynx is clear and moist.  Neck: Neck supple. No thyromegaly present.  Cardiovascular: Normal rate.   Pulmonary/Chest: Effort normal and breath sounds normal. No respiratory distress. She has no wheezes. She has no rales.  Lymphadenopathy:    She has no cervical adenopathy.          Assessment & Plan:  #1 eustachian tube dysfunction. She has nonfocal exam. We've recommended observation and no further medications at this time. #2 urinary urgency. Continue to minimize caffeine consumption. Trial of Myrbetriq 25 mg once daily.  Patient is unable to provide urine specimen today but has no symptoms of infection such as burning with urination

## 2013-09-05 NOTE — Patient Instructions (Signed)
Barotitis Media  Barotitis media is inflammation of your middle ear. This occurs when the auditory tube (eustachian tube) leading from the back of your nose (nasopharynx) to your eardrum is blocked. This blockage may result from a cold, environmental allergies, or an upper respiratory infection. Unresolved barotitis media may lead to damage or hearing loss (barotrauma), which may become permanent.  HOME CARE INSTRUCTIONS   · Use medicines as recommended by your health care provider. Over-the-counter medicines will help unblock the canal and can help during times of air travel.  · Do not put anything into your ears to clean or unplug them. Eardrops will not be helpful.  · Do not swim, dive, or fly until your health care provider says it is all right to do so. If these activities are necessary, chewing gum with frequent, forceful swallowing may help. It is also helpful to hold your nose and gently blow to pop your ears for equalizing pressure changes. This forces air into the eustachian tube.  · Only take over-the-counter or prescription medicines for pain, discomfort, or fever as directed by your health care provider.  · A decongestant may be helpful in decongesting the middle ear and make pressure equalization easier.  SEEK MEDICAL CARE IF:  · You experience a serious form of dizziness in which you feel as if the room is spinning and you feel nauseated (vertigo).  · Your symptoms only involve one ear.  SEEK IMMEDIATE MEDICAL CARE IF:   · You develop a severe headache, dizziness, or severe ear pain.  · You have bloody or pus-like drainage from your ears.  · You develop a fever.  · Your problems do not improve or become worse.  MAKE SURE YOU:   · Understand these instructions.  · Will watch your condition.  · Will get help right away if you are not doing well or get worse.  Document Released: 05/07/2000 Document Revised: 02/28/2013 Document Reviewed: 12/05/2012  ExitCare® Patient Information ©2014 [employer].

## 2013-09-13 ENCOUNTER — Encounter: Payer: Self-pay | Admitting: Internal Medicine

## 2013-09-13 ENCOUNTER — Ambulatory Visit (AMBULATORY_SURGERY_CENTER): Payer: Medicare Other | Admitting: Internal Medicine

## 2013-09-13 ENCOUNTER — Encounter: Payer: Medicare Other | Admitting: Internal Medicine

## 2013-09-13 VITALS — BP 132/64 | HR 64 | Temp 98.2°F | Resp 14 | Ht 62.0 in | Wt 141.0 lb

## 2013-09-13 DIAGNOSIS — Z1211 Encounter for screening for malignant neoplasm of colon: Secondary | ICD-10-CM

## 2013-09-13 DIAGNOSIS — D126 Benign neoplasm of colon, unspecified: Secondary | ICD-10-CM

## 2013-09-13 MED ORDER — SODIUM CHLORIDE 0.9 % IV SOLN: 500.0000 mL | INTRAVENOUS | Status: DC

## 2013-09-13 NOTE — Op Note (Signed)
[facility]  [address]. [place], [address]   COLONOSCOPY PROCEDURE REPORT  PATIENT: Lindsey, Bryant  MR#: [id number] BIRTHDATE: Aug 02, 1940 , 75  yrs. old GENDER: Female ENDOSCOPIST: Eustace Quail, MD REFERRED HE:RDEYC [facility], M.D. PROCEDURE DATE:  09/13/2013 PROCEDURE:   Colonoscopy with snare polypectomy x 1 First Screening Colonoscopy - Avg.  risk and is 50 yrs.  old or older - No.  Prior Negative Screening - Now for repeat screening. 10 or more years since last screening  History of Adenoma - Now for follow-up colonoscopy & has been > or = to 3 yrs.  N/A  Polyps Removed Today? Yes. ASA CLASS:   Class II INDICATIONS:average risk screening. MEDICATIONS: MAC sedation, administered by CRNA and propofol (Diprivan) 200mg  IV  DESCRIPTION OF PROCEDURE:   After the risks benefits and alternatives of the procedure were thoroughly explained, informed consent was obtained.  A digital rectal exam revealed no abnormalities of the rectum.   The [id number]  endoscope was introduced through the anus and advanced to the cecum, which was identified by both the appendix and ileocecal valve. No adverse events experienced.   The quality of the prep was good, using MoviPrep  The instrument was then slowly withdrawn as the colon was fully examined.  COLON FINDINGS: A diminutive hyperplastic-appearing polyp was found in the rectum.  A polypectomy was performed with a cold snare.  The resection was complete and meaningful polyp tissue was not retrieved.   The colon was otherwise normal.  There was no diverticulosis, inflammation, other polyps or cancers . Retroflexed views revealed no abnormalities. The time to cecum=3 minutes 53 seconds.  Withdrawal time=12 minutes 06 seconds.  The scope was withdrawn and the procedure completed. COMPLICATIONS: There were no complications.  ENDOSCOPIC IMPRESSION: 1.   Diminutive polyp was found in the rectum; polypectomy  was performed with a cold snare 2.   The colon was otherwise normal  RECOMMENDATIONS: 1. Return to the care of your primary provider.  GI follow up as needed   eSigned:  Eustace Quail, MD 09/13/2013 9:42 AM   cc: Lisabeth Pick, MD and The Patient

## 2013-09-13 NOTE — Progress Notes (Signed)
Called to room to assist during endoscopic procedure.  Patient ID and intended procedure confirmed with present staff. Received instructions for my participation in the procedure from the performing physician.  

## 2013-09-13 NOTE — Patient Instructions (Signed)
Finding:  One polyp otherwise normal Recommendations:  Return to the care of your primary care provider.  GI follow up as needed  YOU HAD AN ENDOSCOPIC PROCEDURE TODAY AT [facility]: Refer to the procedure report that was given to you for any specific questions about what was found during the examination.  If the procedure report does not answer your questions, please call your gastroenterologist to clarify.  If you requested that your care partner not be given the details of your procedure findings, then the procedure report has been included in a sealed envelope for you to review at your convenience later.  YOU SHOULD EXPECT: Some feelings of bloating in the abdomen. Passage of more gas than usual.  Walking can help get rid of the air that was put into your GI tract during the procedure and reduce the bloating. If you had a lower endoscopy (such as a colonoscopy or flexible sigmoidoscopy) you may notice spotting of blood in your stool or on the toilet paper. If you underwent a bowel prep for your procedure, then you may not have a normal bowel movement for a few days.  DIET: Your first meal following the procedure should be a light meal and then it is ok to progress to your normal diet.  A half-sandwich or bowl of soup is an example of a good first meal.  Heavy or fried foods are harder to digest and may make you feel nauseous or bloated.  Likewise meals heavy in dairy and vegetables can cause extra gas to form and this can also increase the bloating.  Drink plenty of fluids but you should avoid alcoholic beverages for 24 hours.  ACTIVITY: Your care partner should take you home directly after the procedure.  You should plan to take it easy, moving slowly for the rest of the day.  You can resume normal activity the day after the procedure however you should NOT DRIVE or use heavy machinery for 24 hours (because of the sedation medicines used during the test).    SYMPTOMS TO REPORT  IMMEDIATELY: A gastroenterologist can be reached at any hour.  During normal business hours, 8:30 AM to 5:00 PM Monday through Friday, call [phone].  After hours and on weekends, please call the GI answering service at [phone] who will take a message and have the physician on call contact you.   Following lower endoscopy (colonoscopy or flexible sigmoidoscopy):  Excessive amounts of blood in the stool  Significant tenderness or worsening of abdominal pains  Swelling of the abdomen that is new, acute  Fever of 100F or higher  Following upper endoscopy (EGD)  Vomiting of blood or coffee ground material  New chest pain or pain under the shoulder blades  Painful or persistently difficult swallowing  New shortness of breath  Fever of 100F or higher  Black, tarry-looking stools  FOLLOW UP: If any biopsies were taken you will be contacted by phone or by letter within the next 1-3 weeks.  Call your gastroenterologist if you have not heard about the biopsies in 3 weeks.  Our staff will call the home number listed on your records the next business day following your procedure to check on you and address any questions or concerns that you may have at that time regarding the information given to you following your procedure. This is a courtesy call and so if there is no answer at the home number and we have not heard from you through the  emergency physician on call, we will assume that you have returned to your regular daily activities without incident.  SIGNATURES/CONFIDENTIALITY: You and/or your care partner have signed paperwork which will be entered into your electronic medical record.  These signatures attest to the fact that that the information above on your After Visit Summary has been reviewed and is understood.  Full responsibility of the confidentiality of this discharge information lies with you and/or your care-partner.  Please follow all discharge instructions given to you  by the recovery room nurse. If you have any questions or problems after discharge please call one of the numbers listed above. You will receive a phone call in the am to see how you are doing and answer any questions you may have. Thank you for choosing [facility] for your health care needs.

## 2013-09-13 NOTE — Progress Notes (Signed)
A/ox3 pleased with MAC, report to Shari Prows RN

## 2013-09-14 ENCOUNTER — Telehealth: Payer: Self-pay | Admitting: *Deleted

## 2013-09-14 NOTE — Telephone Encounter (Signed)
  Follow up Call-  Call back number 09/13/2013  Post procedure Call Back phone  # [phone]  Permission to leave phone message Yes     Patient questions:  Do you have a fever, pain , or abdominal swelling? no Pain Score  0 *  Have you tolerated food without any problems? yes  Have you been able to return to your normal activities? yes  Do you have any questions about your discharge instructions: Diet   no Medications  no Follow up visit  no  Do you have questions or concerns about your Care? no  Actions: * If pain score is 4 or above: No action needed, pain <4.

## 2013-10-13 ENCOUNTER — Ambulatory Visit (INDEPENDENT_AMBULATORY_CARE_PROVIDER_SITE_OTHER): Payer: Medicare Other | Admitting: Emergency Medicine

## 2013-10-13 VITALS — BP 124/58 | HR 71 | Temp 97.5°F | Resp 16 | Ht 62.0 in | Wt 138.4 lb

## 2013-10-13 DIAGNOSIS — H5789 Other specified disorders of eye and adnexa: Secondary | ICD-10-CM

## 2013-10-13 DIAGNOSIS — H579 Unspecified disorder of eye and adnexa: Secondary | ICD-10-CM

## 2013-10-13 MED ORDER — TOBRAMYCIN 0.3 % OP OINT: 1.0000 "application " | TOPICAL_OINTMENT | Freq: Three times a day (TID) | OPHTHALMIC | Status: DC

## 2013-10-13 NOTE — Progress Notes (Deleted)
[facility] [address], [place] [address] [phone]  Date:  10/13/2013   Name:  Lindsey Bryant   DOB:  10/18/1940   MRN:  [id number]  PCP:  Chancy Hurter, MD    Chief Complaint: Eye Problem   History of Present Illness:  Lindsey Bryant is a 73 y.o. very pleasant female patient who presents with the following:  ***  Patient Active Problem List   Diagnosis Date Noted  . Dyspnea on exertion 08/22/2012  . Fatigue 05/30/2012  . 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 MENOPAUSAL SYNDROME 09/17/2009  . 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 PARKINSON'S 11/21/2006  . Irritable bowel syndrome 11/21/2006    Past Medical History  Diagnosis Date  . Parkinson's disease   . IBS (irritable bowel syndrome)   . Melanoma     right arm  . Bradycardia     a. 02/2012  . 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 Melanoma excision Right 2000    right arm  . Appendectomy  1952  . Pacemaker placement  05/2012    History  Substance Use Topics  . Smoking status: Former Research scientist (life sciences)  . Smokeless tobacco: Never Used     Comment: 10 yrs in college  . Alcohol Use: No    Family History  Problem Relation Age of Onset  . Heart attack Mother 41    Details unclear  . Alcohol abuse Father   . Diabetes Brother   . Breast cancer    . Hypothyroidism Daughter   . Hypothyroidism Daughter   . Colon cancer Neg Hx     No Known Allergies  Medication list has been reviewed and updated.  Current Outpatient Prescriptions on File Prior to Visit  Medication Sig Dispense Refill  . aspirin EC 81 MG tablet Take 81 mg by mouth at bedtime.      . carbidopa-levodopa (SINEMET IR) 25-100 MG per tablet Take 1 tablet by mouth 3 (three) times daily.      Marland Kitchen docusate sodium (COLACE) 100 MG capsule Take 100 mg by mouth daily.      Marland Kitchen  estrogens, conjugated, (PREMARIN) 0.3 MG tablet Take 0.3 mg by mouth at bedtime.      . medroxyPROGESTERone (PROVERA) 5 MG tablet Take 2.5 mg by mouth at bedtime.      . polyethylene glycol (MIRALAX / GLYCOLAX) packet Take 17 g by mouth daily as needed.      . mirabegron ER (MYRBETRIQ) 25 MG TB24 tablet Take 1 tablet (25 mg total) by mouth daily.  30 tablet  6   No current facility-administered medications on file prior to visit.    Review of Systems:  ***  Physical Examination: Filed Vitals:   10/13/13 1001  BP: 124/58  Pulse: 71  Temp: 97.5 F (36.4 C)  Resp: 16   Filed Vitals:   10/13/13 1001  Height: 5\' 2"  (1.575 m)  Weight: 138 lb 6.4 oz (62.778 kg)   Body mass index is 25.31 kg/(m^2). Ideal Body Weight: Weight in (lb) to have BMI = 25: 136.4  ***  Assessment and Plan: *** Signed,  Ellison Carwin, MD

## 2013-10-13 NOTE — Progress Notes (Signed)
Subjective:    Patient ID: Lindsey Bryant, female    DOB: 1941/01/02, 73 y.o.   MRN: [id number] This chart was scribed for Lindsey Russian, MD by Anastasia Pall, ED Scribe. This patient was seen in room 03 and the patient's care was started at 11:11 AM.  Chief Complaint  Patient presents with  . Eye Problem    feels dry, raw, irritated, pink x 2 days   HPI [name] is a 73 y.o. female Pt presents with constant right eye dryness and irritation in the lateral corner, onset 2 days ago, with associated redness. She denies obvious trauma to her eye and denies any known foreign bodies. She reports trying eye drops in her eye without relief. She denies h/o allergies. She reports h/o pink eye, but denies her current symptoms being similar. She has no fever. She denies allergies to medications.  PCP - Chancy Hurter, MD  Patient Active Problem List   Diagnosis Date Noted  . Dyspnea on exertion 08/22/2012  . Fatigue 05/30/2012  . 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 MENOPAUSAL SYNDROME 09/17/2009  . 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 PARKINSON'S 11/21/2006  . Irritable bowel syndrome 11/21/2006   Prior to Admission medications   Medication Sig Start Date End Date Taking? Authorizing Provider  aspirin EC 81 MG tablet Take 81 mg by mouth at bedtime.   Yes Historical Provider, MD  carbidopa-levodopa (SINEMET IR) 25-100 MG per tablet Take 1 tablet by mouth 3 (three) times daily.   Yes Historical Provider, MD  docusate sodium (COLACE) 100 MG capsule Take 100 mg by mouth daily.   Yes Historical Provider, MD  estrogens, conjugated, (PREMARIN) 0.3 MG tablet Take 0.3 mg by mouth at bedtime.   Yes Historical Provider, MD  medroxyPROGESTERone (PROVERA) 5 MG tablet Take 2.5 mg by mouth at bedtime.   Yes Historical Provider, MD  polyethylene glycol (MIRALAX / GLYCOLAX) packet Take 17 g by mouth daily as needed.   Yes Historical Provider, MD  mirabegron ER  (MYRBETRIQ) 25 MG TB24 tablet Take 1 tablet (25 mg total) by mouth daily. 09/05/13   [name], MD   Review of Systems  Constitutional: Negative for fever.  Eyes: Positive for pain (right eye dry and raw) and redness (right).     Objective:   Physical Exam BP 124/58  Pulse 71  Temp(Src) 97.5 F (36.4 C) (Oral)  Resp 16  Ht 5\' 2"  (1.575 m)  Wt 138 lb 6.4 oz (62.778 kg)  BMI 25.31 kg/m2  SpO2 98%  Nursing note and vitals reviewed. Constitutional: He is oriented to person, place, and time. He appears well-developed and well-nourished. No distress.  HENT:  Head: Normocephalic and atraumatic.  Eyes: EOM are normal. Left eye normal. Right eye: Mild redness lateral canthus. Small foreign body present at canthus. Removed with Q-Tip.  Neck: Neck supple.  Cardiovascular: Normal rate.   Pulmonary/Chest: Effort normal. No respiratory distress.  Musculoskeletal: Normal range of motion.  Neurological: He is alert and oriented to person, place, and time.  Skin: Skin is warm and dry.  Psychiatric: He has a normal mood and affect. His behavior is normal.     Assessment & Plan:  I suspect she had a small foreign body which is spontaneously and washed out. This area was stuck at the lateral canthus and removed with a Q-tip. Patient tolerated this well  Small amount Tobrex instilled in the office .. I personally performed the  services described in this documentation, which was scribed in my presence. The recorded information has been reviewed and is accurate.

## 2013-10-23 ENCOUNTER — Encounter: Payer: Self-pay | Admitting: Cardiology

## 2013-10-29 ENCOUNTER — Telehealth: Payer: Self-pay | Admitting: Internal Medicine

## 2013-10-29 NOTE — Telephone Encounter (Signed)
Pt states dr. Elease Hashimoto informed her that he would [facility], pt wants to know if he would accept her husband as well. Please confirm.

## 2013-10-29 NOTE — Telephone Encounter (Signed)
OK 

## 2013-10-30 NOTE — Telephone Encounter (Signed)
Pt will call back and schedule

## 2013-11-26 ENCOUNTER — Telehealth: Payer: Self-pay | Admitting: Internal Medicine

## 2013-11-26 NOTE — Telephone Encounter (Signed)
New problem   Pt need to speak to nurse because  She want to come in and do her pacer check b/c she couldn't do it over the phone in January. Please call pt.

## 2013-11-26 NOTE — Telephone Encounter (Signed)
Routing to device clinic to address 

## 2013-11-30 NOTE — Telephone Encounter (Signed)
Appointment scheduled with Dr. Caryl Comes in August.

## 2014-01-16 ENCOUNTER — Ambulatory Visit (INDEPENDENT_AMBULATORY_CARE_PROVIDER_SITE_OTHER): Payer: Medicare Other | Admitting: Internal Medicine

## 2014-01-16 ENCOUNTER — Encounter: Payer: Self-pay | Admitting: Internal Medicine

## 2014-01-16 VITALS — BP 148/70 | HR 70 | Ht 62.0 in | Wt 135.8 lb

## 2014-01-16 DIAGNOSIS — I442 Atrioventricular block, complete: Secondary | ICD-10-CM

## 2014-01-16 DIAGNOSIS — Z950 Presence of cardiac pacemaker: Secondary | ICD-10-CM

## 2014-01-16 LAB — MDC_IDC_ENUM_SESS_TYPE_INCLINIC
Battery Impedance: 132 Ohm
Battery Voltage: 2.79 V
Brady Statistic AP VP Percent: 46 %
Brady Statistic AP VS Percent: 0 %
Brady Statistic AS VP Percent: 54 %
Brady Statistic AS VS Percent: 0 %
Lead Channel Impedance Value: 584 Ohm
Lead Channel Pacing Threshold Amplitude: 0.5 V
Lead Channel Pacing Threshold Amplitude: 0.5 V
Lead Channel Pacing Threshold Pulse Width: 0.4 ms
Lead Channel Pacing Threshold Pulse Width: 0.4 ms
Lead Channel Sensing Intrinsic Amplitude: 15.67 mV
Lead Channel Sensing Intrinsic Amplitude: 2.8 mV
Lead Channel Setting Pacing Pulse Width: 0.4 ms
MDC IDC MSMT BATTERY REMAINING LONGEVITY: 138 mo
MDC IDC MSMT LEADCHNL RA IMPEDANCE VALUE: 399 Ohm
MDC IDC SESS DTM: 20150826095042
MDC IDC SET LEADCHNL RA PACING AMPLITUDE: 2 V
MDC IDC SET LEADCHNL RV PACING AMPLITUDE: 2.5 V
MDC IDC SET LEADCHNL RV SENSING SENSITIVITY: 5.6 mV

## 2014-01-16 NOTE — Patient Instructions (Addendum)
Remote monitoring is used to monitor your pacemaker from home. This monitoring reduces the number of office visits required to check your device to one time per year. It allows [place] to keep an eye on the functioning of your device to ensure it is working properly. You are scheduled for a device check from home on 04-22-2014. You may send your transmission at any time that day. If you have a wireless device, the transmission will be sent automatically. After your physician reviews your transmission, you will receive a postcard with your next transmission date.  Your physician recommends that you schedule a follow-up appointment in: 12 months with [place]  Your physician recommends that you continue on your current medications as directed. Please refer to the Current Medication list given to you today.

## 2014-01-16 NOTE — Progress Notes (Signed)
      Patient Care Team: Lisabeth Pick, MD as PCP - General Sherlyn Lees, MD as Referring Physician (Neurology)   HPI  Lindsey Bryant is a 73 y.o. 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 She continues to complain of exercise intolerance and we discussed the possiblities of Holter assessment to look for HR responsiveness--she declined  Her Dypsnea is improved  She and her husband are anticipating a trip next month to [place] for 3 weeks; i am jealous     LV function EF 5/14 normal      Past Medical History  Diagnosis Date  . Parkinson's disease   . IBS (irritable bowel syndrome)   . Melanoma     right arm  . Bradycardia     a. 02/2012  . 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 Melanoma excision Right 2000    right arm  . Appendectomy  1952  . Pacemaker placement  05/2012    Current Outpatient Prescriptions  Medication Sig Dispense Refill  . aspirin EC 81 MG tablet Take 81 mg by mouth at bedtime.      . carbidopa-levodopa (SINEMET IR) 25-100 MG per tablet Take by mouth 3 (three) times daily. 1.5 tab tid...      . docusate sodium (COLACE) 100 MG capsule Take 100 mg by mouth daily.      Marland Kitchen estrogens, conjugated, (PREMARIN) 0.3 MG tablet Take 0.3 mg by mouth at bedtime.      . medroxyPROGESTERone (PROVERA) 5 MG tablet Take 2.5 mg by mouth at bedtime.      . mirabegron ER (MYRBETRIQ) 25 MG TB24 tablet Take 1 tablet (25 mg total) by mouth daily.  30 tablet  6  . polyethylene glycol (MIRALAX / GLYCOLAX) packet Take 17 g by mouth daily as needed.      . tobramycin (TOBREX) 0.3 % ophthalmic ointment Place 1 application into the right eye 3 (three) times daily.  3.5 g  0   No current facility-administered medications for this visit.    No Known Allergies  Review of Systems negative  except from HPI and PMH  Physical Exam BP 148/70  Pulse 70  Ht 5\' 2"  (1.575 m)  Wt 135 lb 12.8 oz (61.598 kg)  BMI 24.83 kg/m2 Well developed and nourished in no acute distress HENT normal Neck supple  Clear Device pocket well healed; without hematoma or erythema.  There is no tethering  Regular rate and rhythm, no murmurs or gallops Abd-soft with active BS No Clubbing cyanosis edema Skin-warm and dry A & Oriented  Grossly normal sensory and motor function tremor    Assessment and  Plan  Complete heart block  Narrow escape  Dyspnea on Exertion   Pacemaker Medtronic The patient's device was interrogated.  The information was reviewed. No changes were made in the programming.    Overall better  Continue current course

## 2014-01-18 ENCOUNTER — Encounter: Payer: Self-pay | Admitting: Internal Medicine

## 2014-01-18 ENCOUNTER — Telehealth: Payer: Self-pay | Admitting: Internal Medicine

## 2014-01-18 MED ORDER — CILIDINIUM-CHLORDIAZEPOXIDE 2.5-5 MG PO CAPS: 1.0000 | ORAL_CAPSULE | Freq: Three times a day (TID) | ORAL | Status: DC | PRN

## 2014-01-18 NOTE — Telephone Encounter (Signed)
Is it okay to refill?

## 2014-01-18 NOTE — Telephone Encounter (Signed)
Faxed RX to pharmacy.  

## 2014-01-18 NOTE — Telephone Encounter (Signed)
Pt called to ask for a rx on  LIBRAX  Generic name is CHLORD/CLIDI  She said Dr Leanne Chang had rx this [place]. [place] getting ready to travel [place]    Pt said she is now a pt of Dr Radiographer, therapeutic; [facility] on  [address]

## 2014-01-18 NOTE — Telephone Encounter (Signed)
Refill once 

## 2014-04-22 ENCOUNTER — Ambulatory Visit (INDEPENDENT_AMBULATORY_CARE_PROVIDER_SITE_OTHER): Payer: Medicare Other | Admitting: *Deleted

## 2014-04-22 ENCOUNTER — Telehealth: Payer: Self-pay | Admitting: Internal Medicine

## 2014-04-22 DIAGNOSIS — I442 Atrioventricular block, complete: Secondary | ICD-10-CM

## 2014-04-22 LAB — MDC_IDC_ENUM_SESS_TYPE_REMOTE
Battery Impedance: 156 Ohm
Battery Remaining Longevity: 119 mo
Battery Voltage: 2.79 V
Brady Statistic AS VP Percent: 61 %
Lead Channel Impedance Value: 420 Ohm
Lead Channel Pacing Threshold Amplitude: 0.375 V
Lead Channel Pacing Threshold Amplitude: 0.5 V
Lead Channel Pacing Threshold Pulse Width: 0.4 ms
Lead Channel Pacing Threshold Pulse Width: 0.4 ms
Lead Channel Sensing Intrinsic Amplitude: 2.8 mV
Lead Channel Setting Pacing Amplitude: 2 V
Lead Channel Setting Pacing Amplitude: 2.5 V
Lead Channel Setting Pacing Pulse Width: 0.4 ms
MDC IDC MSMT LEADCHNL RV IMPEDANCE VALUE: 584 Ohm
MDC IDC SESS DTM: 20151130143438
MDC IDC SET LEADCHNL RV SENSING SENSITIVITY: 5.6 mV
MDC IDC STAT BRADY AP VP PERCENT: 39 %
MDC IDC STAT BRADY AP VS PERCENT: 0 %
MDC IDC STAT BRADY AS VS PERCENT: 0 %

## 2014-04-22 NOTE — Telephone Encounter (Signed)
New Msg   Patient states its her first time doing pacer check from home and would prefer for someone to double check that reading was received. Please contact at [phone].

## 2014-04-22 NOTE — Telephone Encounter (Signed)
Spoke with and informed her that transmission was received. She verbalized understanding.

## 2014-04-23 NOTE — Progress Notes (Signed)
Remote pacemaker transmission.   

## 2014-05-02 ENCOUNTER — Encounter (HOSPITAL_COMMUNITY): Payer: Self-pay | Admitting: Internal Medicine

## 2014-05-30 ENCOUNTER — Encounter: Payer: Self-pay | Admitting: Cardiology

## 2014-06-05 ENCOUNTER — Encounter: Payer: Self-pay | Admitting: Internal Medicine

## 2014-06-07 ENCOUNTER — Other Ambulatory Visit: Payer: Self-pay | Admitting: Family Medicine

## 2014-06-13 ENCOUNTER — Encounter: Payer: Self-pay | Admitting: Cardiology

## 2014-07-17 ENCOUNTER — Ambulatory Visit (INDEPENDENT_AMBULATORY_CARE_PROVIDER_SITE_OTHER): Payer: Medicare Other | Admitting: Family Medicine

## 2014-07-17 ENCOUNTER — Encounter: Payer: Self-pay | Admitting: Family Medicine

## 2014-07-17 VITALS — BP 130/80 | HR 74 | Temp 97.3°F | Ht 62.0 in | Wt 139.0 lb

## 2014-07-17 DIAGNOSIS — Z23 Encounter for immunization: Secondary | ICD-10-CM

## 2014-07-17 DIAGNOSIS — Z79899 Other long term (current) drug therapy: Secondary | ICD-10-CM

## 2014-07-17 DIAGNOSIS — Z0000 Encounter for general adult medical examination without abnormal findings: Secondary | ICD-10-CM

## 2014-07-17 DIAGNOSIS — Z780 Asymptomatic menopausal state: Secondary | ICD-10-CM

## 2014-07-17 LAB — CBC WITH DIFFERENTIAL/PLATELET
Basophils Absolute: 0 10*3/uL (ref 0.0–0.1)
Basophils Relative: 0.4 % (ref 0.0–3.0)
EOS PCT: 1.4 % (ref 0.0–5.0)
Eosinophils Absolute: 0.1 10*3/uL (ref 0.0–0.7)
HCT: 39.8 % (ref 36.0–46.0)
HEMOGLOBIN: 13.5 g/dL (ref 12.0–15.0)
Lymphocytes Relative: 21.9 % (ref 12.0–46.0)
Lymphs Abs: 1.6 10*3/uL (ref 0.7–4.0)
MCHC: 33.8 g/dL (ref 30.0–36.0)
MCV: 97.3 fl (ref 78.0–100.0)
MONOS PCT: 5.1 % (ref 3.0–12.0)
Monocytes Absolute: 0.4 10*3/uL (ref 0.1–1.0)
Neutro Abs: 5.2 10*3/uL (ref 1.4–7.7)
Neutrophils Relative %: 71.2 % (ref 43.0–77.0)
PLATELETS: 160 10*3/uL (ref 150.0–400.0)
RBC: 4.09 Mil/uL (ref 3.87–5.11)
RDW: 12.8 % (ref 11.5–15.5)
WBC: 7.4 10*3/uL (ref 4.0–10.5)

## 2014-07-17 LAB — TSH: TSH: 2.28 u[IU]/mL (ref 0.35–4.50)

## 2014-07-17 LAB — BASIC METABOLIC PANEL
BUN: 12 mg/dL (ref 6–23)
CALCIUM: 9.5 mg/dL (ref 8.4–10.5)
CO2: 30 mEq/L (ref 19–32)
Chloride: 106 mEq/L (ref 96–112)
Creatinine, Ser: 0.79 mg/dL (ref 0.40–1.20)
GFR: 75.83 mL/min (ref >60.00)
Glucose, Bld: 89 mg/dL (ref 70–99)
POTASSIUM: 4.6 meq/L (ref 3.5–5.1)
SODIUM: 140 meq/L (ref 135–145)

## 2014-07-17 LAB — HEPATIC FUNCTION PANEL
ALK PHOS: 48 U/L (ref 39–117)
ALT: 18 U/L (ref 0–35)
AST: 19 U/L (ref 0–37)
Albumin: 4.2 g/dL (ref 3.5–5.2)
BILIRUBIN TOTAL: 0.4 mg/dL (ref 0.2–1.2)
Bilirubin, Direct: 0.1 mg/dL (ref 0.0–0.3)
Total Protein: 7 g/dL (ref 6.0–8.3)

## 2014-07-17 LAB — LIPID PANEL
Cholesterol: 199 mg/dL (ref 0–200)
HDL: 75.7 mg/dL (ref >39.00)
LDL Cholesterol: 108 mg/dL — ABNORMAL HIGH (ref 0–99)
NONHDL: 123.3
TRIGLYCERIDES: 77 mg/dL (ref 0.0–149.0)
Total CHOL/HDL Ratio: 3
VLDL: 15.4 mg/dL (ref 0.0–40.0)

## 2014-07-17 NOTE — Progress Notes (Signed)
Pre visit review using our clinic review tool, if applicable. No additional management support is needed unless otherwise documented below in the visit note. 

## 2014-07-17 NOTE — Patient Instructions (Signed)
Recommend daily calcium 1200 mg and vitamin D 1000 international units We will set up repeat bone density scan Continue yearly mammogram Continue yearly flu vaccine

## 2014-07-17 NOTE — Progress Notes (Signed)
Subjective:    Patient ID: Lindsey Bryant, female    DOB: 16-Mar-1941, 74 y.o.   MRN: [id number]  HPI Patient seen for annual physical and medical follow-up. Her chronic prompt include history of Parkinson's disease, complete heart block, history of melanoma, irritable bowel syndrome. She is postmenopausal is been on low-dose Premarin apparently for several years. She is seeing gynecologist earlier this year. She's having issues with prior authorization with Premarin 0.3 mg. She's tried stopping this many times but has had some emotional lability and wishes to remain on this. She had recent mammogram 2 months ago reportedly normal. We have no record. Needs Prevnar 13. Colonoscopy up-to-date.  She has history of urinary urgency. She takes Myrbetriq though somewhat inconsistently. Symptoms stable  Past Medical History  Diagnosis Date  . Parkinson's disease   . IBS (irritable bowel syndrome)   . Melanoma     right arm  . Bradycardia     a. 02/2012  . 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 Melanoma excision Right 2000    right arm  . Appendectomy  1952  . Pacemaker placement  05/2012  . Permanent pacemaker insertion N/A 03/06/2012    Procedure: PERMANENT PACEMAKER INSERTION;  Surgeon: Deboraha Sprang, MD;  Location: [facility] CATH LAB;  Service: Cardiovascular;  Laterality: N/A;  . Pacemaker revision N/A 03/14/2012    Procedure: PACEMAKER REVISION;  Surgeon: Thompson Grayer, MD;  Location: [facility] CATH LAB;  Service: Cardiovascular;  Laterality: N/A;    reports that she has quit smoking. She has never used smokeless tobacco. She reports that she does not drink alcohol or use illicit drugs. family history includes Alcohol abuse in her father; Breast cancer in an other family member; Diabetes in her brother; Heart attack (age of onset: 74) in her mother; Hypothyroidism in her daughter and daughter.  There is no history of Colon cancer. No Known Allergies  1.  Risk factors based on Past Medical , Social, and Family history reviewed and as indicated above with no changes 2.  Limitations in physical activities None.  No recent falls. She is at high risk for falls secondary to Parkinson's disease but no falls during the past year 3.  Depression/mood No active depression or anxiety issues 4.  Hearing No defiits 5.  ADLs independent in all. 6.  Cognitive function (orientation to time and place, language, writing, speech,memory) no short or long term memory issues.  Language and judgement intact. 7.  Home Safety no issues 8.  Height, weight, and visual acuity.all stable. 9.  Counseling discussed fall prevention. We discussed adequate calcium and vitamin D 10. Recommendation of preventive services. Prevnar 13. Set up DEXA scan 11. Labs based on risk factors lipid, CBC, hepatic, basic metabolic panel, TSH 12. Care Plan as above 13. Other Providers Dr Caryl Comes (cardiology), Dr Marzetta Board (neurology) Dr Karma Ganja (derm) 14. Written schedule of screening/prevention services given to patient.    Review of Systems  Constitutional: Negative for fever, activity change, appetite change, fatigue and unexpected weight change.  HENT: Negative for ear pain, hearing loss, sore throat and trouble swallowing.   Eyes: Negative for visual disturbance.  Respiratory: Negative for cough and shortness of breath.   Cardiovascular: Negative for chest pain and palpitations.  Gastrointestinal: Negative for abdominal pain, diarrhea, constipation and blood in stool.  Endocrine: Negative for polydipsia and polyuria.  Genitourinary: Negative  for dysuria and hematuria.  Musculoskeletal: Negative for myalgias, back pain and arthralgias.  Skin: Negative for rash.  Neurological: Positive for tremors. Negative for dizziness, syncope and headaches.  Hematological: Negative for adenopathy.  Psychiatric/Behavioral: Negative for  confusion and dysphoric mood.       Objective:   Physical Exam  Constitutional: She is oriented to person, place, and time. She appears well-developed and well-nourished.  HENT:  Head: Normocephalic and atraumatic.  Mouth/Throat: Oropharynx is clear and moist.  Eyes: EOM are normal. Pupils are equal, round, and reactive to light.  Neck: Normal range of motion. Neck supple. No thyromegaly present.  Cardiovascular: Normal rate, regular rhythm and normal heart sounds.   No murmur heard. Pulmonary/Chest: Breath sounds normal. No respiratory distress. She has no wheezes. She has no rales.  Abdominal: Soft. Bowel sounds are normal. She exhibits no distension and no mass. There is no tenderness. There is no rebound and no guarding.  Genitourinary:  She had recent pelvic exam and breast exam per GYN within the past few months  Musculoskeletal: Normal range of motion. She exhibits no edema.  Lymphadenopathy:    She has no cervical adenopathy.  Neurological: She is alert and oriented to person, place, and time. She displays normal reflexes. No cranial nerve deficit.  She has upper extremity tremor and lesser extent head and neck typical of Parkinson's disease  Skin: No rash noted.  Psychiatric: She has a normal mood and affect. Her behavior is normal. Judgment and thought content normal.          Assessment & Plan:  #1 complete physical. We addressed several items. Immunizations up-to-date with the exception of Prevnar 13. Set up repeat DEXA scan. Start regular calcium and vitamin D supplementation. We explained that she does not have indication to get further Pap smears. Continue yearly mammogram. Colonoscopy up-to-date #2 postmenopausal estrogen therapy. We sent quite some time discussing pros and cons of estrogen. She's having coverage issues. If she is unable to get covered she will try to stop her Premarin and Provera. #3 history of urinary urgency. Continue Myrbetriq as needed

## 2014-07-22 ENCOUNTER — Ambulatory Visit (INDEPENDENT_AMBULATORY_CARE_PROVIDER_SITE_OTHER): Payer: Medicare Other | Admitting: *Deleted

## 2014-07-22 DIAGNOSIS — I442 Atrioventricular block, complete: Secondary | ICD-10-CM

## 2014-07-23 NOTE — Progress Notes (Signed)
Remote pacemaker transmission.   

## 2014-07-29 LAB — MDC_IDC_ENUM_SESS_TYPE_REMOTE
Battery Remaining Longevity: 119 mo
Brady Statistic AP VP Percent: 42 %
Brady Statistic AP VS Percent: 0 %
Brady Statistic AS VS Percent: 0 %
Date Time Interrogation Session: 20160229141650
Lead Channel Impedance Value: 420 Ohm
Lead Channel Impedance Value: 623 Ohm
Lead Channel Pacing Threshold Amplitude: 0.375 V
Lead Channel Pacing Threshold Amplitude: 0.5 V
Lead Channel Pacing Threshold Pulse Width: 0.4 ms
Lead Channel Pacing Threshold Pulse Width: 0.4 ms
Lead Channel Setting Pacing Amplitude: 2 V
Lead Channel Setting Sensing Sensitivity: 5.6 mV
MDC IDC MSMT BATTERY IMPEDANCE: 156 Ohm
MDC IDC MSMT BATTERY VOLTAGE: 2.79 V
MDC IDC MSMT LEADCHNL RA SENSING INTR AMPL: 2.8 mV — AB
MDC IDC SET LEADCHNL RV PACING AMPLITUDE: 2.5 V
MDC IDC SET LEADCHNL RV PACING PULSEWIDTH: 0.4 ms
MDC IDC STAT BRADY AS VP PERCENT: 58 %

## 2014-08-12 ENCOUNTER — Inpatient Hospital Stay: Admission: RE | Admit: 2014-08-12 | Payer: Medicare Other | Source: Ambulatory Visit

## 2014-08-21 ENCOUNTER — Ambulatory Visit (INDEPENDENT_AMBULATORY_CARE_PROVIDER_SITE_OTHER)
Admission: RE | Admit: 2014-08-21 | Discharge: 2014-08-21 | Disposition: A | Discharge status: Home / Self Care | Payer: Medicare Other | Source: Ambulatory Visit | Attending: Family Medicine | Admitting: Family Medicine

## 2014-08-21 ENCOUNTER — Inpatient Hospital Stay: Admission: RE | Admit: 2014-08-21 | Payer: Medicare Other | Source: Ambulatory Visit

## 2014-08-21 DIAGNOSIS — Z780 Asymptomatic menopausal state: Secondary | ICD-10-CM | POA: Diagnosis not present

## 2014-08-23 ENCOUNTER — Other Ambulatory Visit: Payer: Self-pay

## 2014-08-23 MED ORDER — ESTROGENS CONJUGATED 0.3 MG PO TABS: 0.3000 mg | ORAL_TABLET | Freq: Every day | ORAL | Status: DC

## 2014-08-27 ENCOUNTER — Encounter: Payer: Self-pay | Admitting: Cardiology

## 2014-09-02 ENCOUNTER — Encounter: Payer: Self-pay | Admitting: Internal Medicine

## 2014-09-10 ENCOUNTER — Encounter: Payer: Self-pay | Admitting: Cardiology

## 2014-10-22 ENCOUNTER — Ambulatory Visit (INDEPENDENT_AMBULATORY_CARE_PROVIDER_SITE_OTHER): Payer: Medicare Other | Admitting: *Deleted

## 2014-10-22 ENCOUNTER — Telehealth: Payer: Self-pay | Admitting: Cardiology

## 2014-10-22 DIAGNOSIS — I442 Atrioventricular block, complete: Secondary | ICD-10-CM | POA: Diagnosis not present

## 2014-10-22 NOTE — Telephone Encounter (Signed)
LMOVM reminding pt to send remote transmission.   

## 2014-10-23 NOTE — Progress Notes (Signed)
Remote pacemaker transmission.   

## 2014-10-26 LAB — CUP PACEART REMOTE DEVICE CHECK
Battery Impedance: 156 Ohm
Battery Voltage: 2.79 V
Brady Statistic AP VP Percent: 43 %
Brady Statistic AP VS Percent: 0 %
Brady Statistic AS VS Percent: 0 %
Date Time Interrogation Session: 20160531183517
Lead Channel Impedance Value: 584 Ohm
Lead Channel Pacing Threshold Pulse Width: 0.4 ms
Lead Channel Sensing Intrinsic Amplitude: 2.8 mV
Lead Channel Setting Pacing Amplitude: 2 V
Lead Channel Setting Pacing Amplitude: 2.5 V
Lead Channel Setting Pacing Pulse Width: 0.4 ms
Lead Channel Setting Sensing Sensitivity: 5.6 mV
MDC IDC MSMT BATTERY REMAINING LONGEVITY: 118 mo
MDC IDC MSMT LEADCHNL RA IMPEDANCE VALUE: 432 Ohm
MDC IDC MSMT LEADCHNL RA PACING THRESHOLD AMPLITUDE: 0.375 V
MDC IDC MSMT LEADCHNL RV PACING THRESHOLD AMPLITUDE: 0.375 V
MDC IDC MSMT LEADCHNL RV PACING THRESHOLD PULSEWIDTH: 0.4 ms
MDC IDC STAT BRADY AS VP PERCENT: 57 %

## 2014-11-13 ENCOUNTER — Encounter: Payer: Self-pay | Admitting: Cardiology

## 2014-11-18 ENCOUNTER — Encounter: Payer: Self-pay | Admitting: Internal Medicine

## 2014-11-22 ENCOUNTER — Telehealth: Payer: Self-pay | Admitting: Family Medicine

## 2014-11-22 NOTE — Telephone Encounter (Signed)
I called to submit a PA for premarin and was advised by the representative that the medication is non-formulary.  The PA was submitted anyway, ref # [id number].  The list of formulary alternatives are:  Actonel, Fosamax, Boniva, Evista, Premarin cream, and gabapentin.    I will be out of the office next week and I might not get the fax back from [place], so I'm sending this now.

## 2014-12-19 ENCOUNTER — Telehealth: Payer: Self-pay

## 2014-12-19 NOTE — Telephone Encounter (Signed)
Lm on vm for ot to cb

## 2014-12-19 NOTE — Telephone Encounter (Signed)
Can you please call patient to have her come for a follow up on medication. To discuss Premarin for PA

## 2014-12-22 ENCOUNTER — Other Ambulatory Visit: Payer: Self-pay | Admitting: Family Medicine

## 2014-12-25 NOTE — Telephone Encounter (Signed)
No vm this time. Unable to LM.

## 2014-12-27 NOTE — Telephone Encounter (Signed)
Tried again to call pt

## 2014-12-31 ENCOUNTER — Other Ambulatory Visit: Payer: Self-pay | Admitting: Family Medicine

## 2015-01-01 ENCOUNTER — Telehealth: Payer: Self-pay | Admitting: Family Medicine

## 2015-01-01 NOTE — Telephone Encounter (Signed)
Refill request for Premarin 0.3 mg take 1 po qhs and a 90 day supply to [facility] fax number is [phone]. Company dispenses in sealed bottles of quantity 84.

## 2015-01-03 MED ORDER — ESTROGENS CONJUGATED 0.3 MG PO TABS: 0.3000 mg | ORAL_TABLET | Freq: Every day | ORAL | Status: DC

## 2015-01-03 NOTE — Telephone Encounter (Signed)
Rx sent to pharmacy   

## 2015-01-15 NOTE — Telephone Encounter (Signed)
Called pt again today and she states the refilled her premarin for 90 days. After That they would need to speak to dr burchette.  Advised pt Dr Elease Hashimoto would like to discuss in the office, and appt was made for November 10. Pt declined earlier appt b/c she has her meds through then.

## 2015-01-28 ENCOUNTER — Encounter: Payer: Self-pay | Admitting: Internal Medicine

## 2015-01-28 ENCOUNTER — Ambulatory Visit (INDEPENDENT_AMBULATORY_CARE_PROVIDER_SITE_OTHER): Payer: Medicare Other | Admitting: Internal Medicine

## 2015-01-28 VITALS — BP 128/82 | HR 67 | Ht 62.0 in | Wt 139.4 lb

## 2015-01-28 DIAGNOSIS — Z45018 Encounter for adjustment and management of other part of cardiac pacemaker: Secondary | ICD-10-CM

## 2015-01-28 DIAGNOSIS — I442 Atrioventricular block, complete: Secondary | ICD-10-CM

## 2015-01-28 LAB — CUP PACEART INCLINIC DEVICE CHECK
Battery Impedance: 180 Ohm
Battery Remaining Longevity: 115 mo
Battery Voltage: 2.79 V
Brady Statistic AP VP Percent: 43 %
Brady Statistic AP VS Percent: 0 %
Brady Statistic AS VP Percent: 57 %
Brady Statistic AS VS Percent: 0 %
Lead Channel Impedance Value: 409 Ohm
Lead Channel Impedance Value: 610 Ohm
Lead Channel Pacing Threshold Amplitude: 0.5 V
Lead Channel Pacing Threshold Amplitude: 0.5 V
Lead Channel Pacing Threshold Pulse Width: 0.4 ms
Lead Channel Sensing Intrinsic Amplitude: 2.8 mV
Lead Channel Setting Pacing Amplitude: 2 V
Lead Channel Setting Pacing Pulse Width: 0.4 ms
MDC IDC MSMT LEADCHNL RV PACING THRESHOLD PULSEWIDTH: 0.4 ms
MDC IDC SESS DTM: 20160906171929
MDC IDC SET LEADCHNL RV PACING AMPLITUDE: 2.5 V
MDC IDC SET LEADCHNL RV SENSING SENSITIVITY: 2.8 mV

## 2015-01-28 NOTE — Patient Instructions (Signed)
Medication Instructions:  Your physician recommends that you continue on your current medications as directed. Please refer to the Current Medication list given to you today.  Labwork: None ordered  Testing/Procedures: None ordered  Follow-Up: Remote monitoring is used to monitor your Pacemaker of ICD from home. This monitoring reduces the number of office visits required to check your device to one time per year. It allows [place] to keep an eye on the functioning of your device to ensure it is working properly. You are scheduled for a device check from home on 04/29/15. You may send your transmission at any time that day. If you have a wireless device, the transmission will be sent automatically. After your physician reviews your transmission, you will receive a postcard with your next transmission date.  Your physician wants you to follow-up in: 1 year with Chanetta Marshall, NP.  You will receive a reminder letter in the mail two months in advance. If you don't receive a letter, please call our office to schedule the follow-up appointment.  Any Other Special Instructions Will Be Listed Below (If Applicable). Thank you for choosing [facility]!!   Trinidad Curet, RN [phone]

## 2015-01-28 NOTE — Progress Notes (Signed)
Patient Care Team: [name], MD as PCP - General (Family Medicine) Sherlyn Lees, MD as Referring Physician (Neurology)   HPI  Lindsey Bryant is a 74 y.o. 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 She continues to complain of exercise intolerance and we discussed the possiblities of Holter assessment to look for HR responsiveness--she declined  Her Dypsnea is improved her biggest complaint is fatigue. She notes that she awakens frequently in the late night.  They ghad a great trip to [place] staying with [place] at home   LV function EF 5/14 normal      Past Medical History  Diagnosis Date  . Parkinson's disease   . IBS (irritable bowel syndrome)   . Melanoma     right arm  . Bradycardia     a. 02/2012  . 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 Melanoma excision Right 2000    right arm  . Appendectomy  1952  . Pacemaker placement  05/2012  . Permanent pacemaker insertion N/A 03/06/2012    Procedure: PERMANENT PACEMAKER INSERTION;  Surgeon: Deboraha Sprang, MD;  Location: [facility] CATH LAB;  Service: Cardiovascular;  Laterality: N/A;  . Pacemaker revision N/A 03/14/2012    Procedure: PACEMAKER REVISION;  Surgeon: Thompson Grayer, MD;  Location: [facility] CATH LAB;  Service: Cardiovascular;  Laterality: N/A;    Current Outpatient Prescriptions  Medication Sig Dispense Refill  . aspirin EC 81 MG tablet Take 81 mg by mouth at bedtime.    . carbidopa-levodopa (SINEMET IR) 25-100 MG per tablet Take by mouth 3 (three) times daily. 1.5 tab tid...    . clidinium-chlordiazePOXIDE (LIBRAX) 5-2.5 MG per capsule Take 1 capsule by mouth 3 (three) times daily as needed. 60 capsule 0  . docusate sodium (COLACE) 100 MG capsule Take 100 mg by mouth daily.    Marland Kitchen estrogens, conjugated,  (PREMARIN) 0.3 MG tablet Take 1 tablet (0.3 mg total) by mouth at bedtime. 84 tablet 1  . medroxyPROGESTERone (PROVERA) 5 MG tablet Take 2.5 mg by mouth at bedtime.    Marland Kitchen MYRBETRIQ 25 MG TB24 tablet TAKE ONE TABLET BY MOUTH ONE TIME DAILY 30 tablet 5  . Omega-3 1000 MG CAPS Take 1 capsule by mouth daily.    . polyethylene glycol (MIRALAX / GLYCOLAX) packet Take 17 g by mouth daily as needed.    . tobramycin (TOBREX) 0.3 % ophthalmic ointment Place 1 application into the right eye 3 (three) times daily. 3.5 g 0   No current facility-administered medications for this visit.    No Known Allergies  Review of Systems negative except from HPI and PMH  Physical Exam BP 128/82 mmHg  Pulse 67  Ht 5\' 2"  (1.575 m)  Wt 139 lb 6.4 oz (63.231 kg)  BMI 25.49 kg/m2 Well developed and nourished in no acute distress HENT normal Neck supple  Clear Device pocket well healed; without hematoma or erythema.  There is no tethering  Regular rate and rhythm, no murmurs or gallops Abd-soft with active BS No Clubbing cyanosis edema Skin-warm and dry A & Oriented  Grossly normal sensory and motor function tremor    Assessment and  Plan  Complete heart block  Narrow escape  Dyspnea on Exertion   Fatigue  Pacemaker Medtronic The  patient's device was interrogated.  The information was reviewed. No changes were made in the programming.    Overall better  Continue current course

## 2015-02-05 ENCOUNTER — Encounter: Payer: Self-pay | Admitting: Internal Medicine

## 2015-04-03 ENCOUNTER — Ambulatory Visit (INDEPENDENT_AMBULATORY_CARE_PROVIDER_SITE_OTHER): Payer: Medicare Other | Admitting: Family Medicine

## 2015-04-03 ENCOUNTER — Encounter: Payer: Self-pay | Admitting: Family Medicine

## 2015-04-03 VITALS — BP 110/80 | HR 77 | Temp 98.1°F | Resp 16 | Ht 62.0 in | Wt 140.9 lb

## 2015-04-03 DIAGNOSIS — Z79890 Hormone replacement therapy (postmenopausal): Secondary | ICD-10-CM | POA: Diagnosis not present

## 2015-04-03 MED ORDER — MEDROXYPROGESTERONE ACETATE 2.5 MG PO TABS: 2.5000 mg | ORAL_TABLET | Freq: Every day | ORAL | Status: DC

## 2015-04-03 MED ORDER — ESTROGENS CONJUGATED 0.3 MG PO TABS: 0.3000 mg | ORAL_TABLET | Freq: Every day | ORAL | Status: DC

## 2015-04-03 NOTE — Patient Instructions (Signed)
Hormone Therapy At menopause, your body begins making less estrogen and progesterone hormones. This causes the body to stop having menstrual periods. This is because estrogen and progesterone hormones control your periods and menstrual cycle. A lack of estrogen may cause symptoms such as:  Hot flushes (or hot flashes).  Vaginal dryness.  Dry skin.  Loss of sex drive.  Risk of bone loss (osteoporosis). When this happens, you may choose to take hormone therapy to get back the estrogen lost during menopause. When the hormone estrogen is given alone, it is usually referred to as ET (Estrogen Therapy). When the hormone progestin is combined with estrogen, it is generally called HT (Hormone Therapy). This was formerly known as hormone replacement therapy (HRT). Your caregiver can help you make a decision on what will be best for you. The decision to use HT seems to change often as new studies are done. Many studies do not agree on the benefits of hormone replacement therapy. LIKELY BENEFITS OF HT INCLUDE PROTECTION FROM:  Hot Flushes (also called hot flashes) - A hot flush is a sudden feeling of heat that spreads over the face and body. The skin may redden like a blush. It is connected with sweats and sleep disturbance. Women going through menopause may have hot flushes a few times a month or several times per day depending on the woman.  Osteoporosis (bone loss) - Estrogen helps guard against bone loss. After menopause, a woman's bones slowly lose calcium and become weak and brittle. As a result, bones are more likely to break. The hip, wrist, and spine are affected most often. Hormone therapy can help slow bone loss after menopause. Weight bearing exercise and taking calcium with vitamin D also can help prevent bone loss. There are also medications that your caregiver can prescribe that can help prevent osteoporosis.  Vaginal dryness - Loss of estrogen causes changes in the vagina. Its lining may  become thin and dry. These changes can cause pain and bleeding during sexual intercourse. Dryness can also lead to infections. This can cause burning and itching. (Vaginal estrogen treatment can help relieve pain, itching, and dryness.)  Urinary tract infections are more common after menopause because of lack of estrogen. Some women also develop urinary incontinence because of low estrogen levels in the vagina and bladder.  Possible other benefits of estrogen include a positive effect on mood and short-term memory in women. RISKS AND COMPLICATIONS  Using estrogen alone without progesterone causes the lining of the uterus to grow. This increases the risk of lining of the uterus (endometrial) cancer. Your caregiver should give another hormone called progestin if you have a uterus.  Women who take combined (estrogen and progestin) HT appear to have an increased risk of breast cancer. The risk appears to be small, but increases throughout the time that HT is taken.  Combined therapy also makes the breast tissue slightly denser which makes it harder to read mammograms (breast X-rays).  Combined, estrogen and progesterone therapy can be taken together every day, in which case there may be spotting of blood. HT therapy can be taken cyclically in which case you will have menstrual periods. Cyclically means HT is taken for a set amount of days, then not taken, then this process is repeated.  HT may increase the risk of stroke, heart attack, breast cancer and forming blood clots in your leg.  Transdermal estrogen (estrogen that is absorbed through the skin with a patch or a cream) may have better results with:  Cholesterol.  Blood pressure.  Blood clots. Having the following conditions may indicate you should not have HT:  Endometrial cancer.  Liver disease.  Breast cancer.  Heart disease.  History of blood clots.  Stroke. TREATMENT   If you choose to take HT and have a uterus, usually  estrogen and progestin are prescribed.  Your caregiver will help you decide the best way to take the medications.  Possible ways to take estrogen include:  Pills.  Patches.  Gels.  Sprays.  Vaginal estrogen cream, rings and tablets.  It is best to take the lowest dose possible that will help your symptoms and take them for the shortest period of time that you can.  Hormone therapy can help relieve some of the problems (symptoms) that affect women at menopause. Before making a decision about HT, talk to your caregiver about what is best for you. Be well informed and comfortable with your decisions. HOME CARE INSTRUCTIONS   Follow your caregivers advice when taking the medications.  A Pap test is done to screen for cervical cancer.  The first Pap test should be done at age 34.  Between ages 80 and 52, Pap tests are repeated every 2 years.  Beginning at age 13, you are advised to have a Pap test every 3 years as long as the past 3 Pap tests have been normal.  Some women have medical problems that increase the chance of getting cervical cancer. Talk to your caregiver about these problems. It is especially important to talk to your caregiver if a new problem develops soon after your last Pap test. In these cases, your caregiver may recommend more frequent screening and Pap tests.  The above recommendations are the same for women who have or have not gotten the vaccine for HPV (human papillomavirus).  If you had a hysterectomy for a problem that was not a cancer or a condition that could lead to cancer, then you no longer need Pap tests. However, even if you no longer need a Pap test, a regular exam is a good idea to make sure no other problems are starting.  If you are between ages 20 and 60, and you have had normal Pap tests going back 10 years, you no longer need Pap tests. However, even if you no longer need a Pap test, a regular exam is a good idea to make sure no other problems  are starting.  If you have had past treatment for cervical cancer or a condition that could lead to cancer, you need Pap tests and screening for cancer for at least 20 years after your treatment.  If Pap tests have been discontinued, risk factors (such as a new sexual partner)need to be re-assessed to determine if screening should be resumed.  Some women may need screenings more often if they are at high risk for cervical cancer.  Get mammograms done as per the advice of your caregiver. SEEK IMMEDIATE MEDICAL CARE IF:  You develop abnormal vaginal bleeding.  You have pain or swelling in your legs, shortness of breath, or chest pain.  You develop dizziness or headaches.  You have lumps or changes in your breasts or armpits.  You have slurred speech.  You develop weakness or numbness of your arms or legs.  You have pain, burning, or bleeding when urinating.  You develop abdominal pain.   This information is not intended to replace advice given to you by your health care provider. Make sure you discuss any questions  you have with your health care provider.   Document Released: 02/06/2003 Document Revised: 09/24/2014 Document Reviewed: 11/11/2014 Elsevier Interactive Patient Education 2016 [employer]  

## 2015-04-03 NOTE — Progress Notes (Signed)
Pre visit review using our clinic review tool, if applicable. No additional management support is needed unless otherwise documented below in the visit note. 

## 2015-04-03 NOTE — Progress Notes (Signed)
   Subjective:    Patient ID: Lindsey Bryant, female    DOB: Mar 23, 1941, 74 y.o.   MRN: [id number]  HPI Patient here requesting refills. She is postmenopausal and has been on estrogen replacement for many years. She's tried several times stopping this but had fatigue issues and dry skin issues. Takes low-dose Premarin 0.3 mg and Provera 2.5 mgs once daily. Her gynecologist retired early this year. She gets yearly mammograms. No history of liver issues. No history of any blood clot issues.  Parkinson's disease followed by neurology. She has some fatigue issues and occasional orthostatic symptoms. No recent falls.  Past Medical History  Diagnosis Date  . Parkinson's disease   . IBS (irritable bowel syndrome)   . Melanoma ([place])     right arm  . Bradycardia     a. 02/2012  . 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 Melanoma excision Right 2000    right arm  . Appendectomy  1952  . Pacemaker placement  05/2012  . Permanent pacemaker insertion N/A 03/06/2012    Procedure: PERMANENT PACEMAKER INSERTION;  Surgeon: Deboraha Sprang, MD;  Location: [facility] CATH LAB;  Service: Cardiovascular;  Laterality: N/A;  . Pacemaker revision N/A 03/14/2012    Procedure: PACEMAKER REVISION;  Surgeon: Thompson Grayer, MD;  Location: [facility] CATH LAB;  Service: Cardiovascular;  Laterality: N/A;    reports that she has quit smoking. She has never used smokeless tobacco. She reports that she does not drink alcohol or use illicit drugs. family history includes Alcohol abuse in her father; Breast cancer in an other family member; Diabetes in her brother; Heart attack (age of onset: 70) in her mother; Hypothyroidism in her daughter and daughter. There is no history of Colon cancer. No Known Allergies'    Review of Systems  Constitutional: Negative for fatigue.  Eyes: Negative for visual disturbance.  Respiratory:  Negative for cough, chest tightness, shortness of breath and wheezing.   Cardiovascular: Negative for chest pain, palpitations and leg swelling.  Neurological: Positive for tremors. Negative for dizziness, seizures, syncope, weakness, light-headedness and headaches.       Objective:   Physical Exam  Constitutional: She appears well-developed and well-nourished.  Eyes: Pupils are equal, round, and reactive to light.  Neck: Neck supple. No JVD present. No thyromegaly present.  Cardiovascular: Normal rate and regular rhythm.  Exam reveals no gallop.   Pulmonary/Chest: Effort normal and breath sounds normal. No respiratory distress. She has no wheezes. She has no rales.  Musculoskeletal: She exhibits no edema.  Neurological: She is alert.  Patient has resting tremor consistent with Parkinson's disease          Assessment & Plan:  Postmenopausal estrogen replacement. Patient requesting refills. We reviewed pros and cons of postmenopausal hormone replacement. She has no significant contraindications. She has been maintained on low-dose for many years. Refill Premarin and Provera. Continue yearly mammogram.

## 2015-04-29 ENCOUNTER — Telehealth: Payer: Self-pay | Admitting: Cardiology

## 2015-04-29 ENCOUNTER — Encounter: Payer: Medicare Other | Admitting: *Deleted

## 2015-04-29 NOTE — Telephone Encounter (Signed)
LMOVM reminding pt to send remote transmission.   

## 2015-04-30 ENCOUNTER — Encounter: Payer: Self-pay | Admitting: Cardiology

## 2015-06-09 ENCOUNTER — Other Ambulatory Visit: Payer: Self-pay | Admitting: Family Medicine

## 2015-06-13 ENCOUNTER — Ambulatory Visit (INDEPENDENT_AMBULATORY_CARE_PROVIDER_SITE_OTHER): Payer: Medicare Other | Admitting: *Deleted

## 2015-06-13 ENCOUNTER — Encounter: Payer: Self-pay | Admitting: Internal Medicine

## 2015-06-13 DIAGNOSIS — I442 Atrioventricular block, complete: Secondary | ICD-10-CM | POA: Diagnosis not present

## 2015-06-13 DIAGNOSIS — Z950 Presence of cardiac pacemaker: Secondary | ICD-10-CM

## 2015-06-13 LAB — CUP PACEART INCLINIC DEVICE CHECK
Battery Impedance: 180 Ohm
Battery Voltage: 2.79 V
Brady Statistic AP VS Percent: 0 %
Brady Statistic AS VP Percent: 62 %
Brady Statistic AS VS Percent: 0 %
Implantable Lead Location: 753859
Implantable Lead Model: 5076
Implantable Lead Model: 5076
Lead Channel Impedance Value: 404 Ohm
Lead Channel Pacing Threshold Amplitude: 0.5 V
Lead Channel Pacing Threshold Amplitude: 0.5 V
Lead Channel Pacing Threshold Pulse Width: 0.4 ms
Lead Channel Pacing Threshold Pulse Width: 0.4 ms
Lead Channel Setting Pacing Amplitude: 2.5 V
MDC IDC LEAD IMPLANT DT: 20131014
MDC IDC LEAD IMPLANT DT: 20131014
MDC IDC LEAD LOCATION: 753860
MDC IDC MSMT BATTERY REMAINING LONGEVITY: 116 mo
MDC IDC MSMT LEADCHNL RA SENSING INTR AMPL: 2.8 mV
MDC IDC MSMT LEADCHNL RV IMPEDANCE VALUE: 622 Ohm
MDC IDC SESS DTM: 20170120102828
MDC IDC SET LEADCHNL RA PACING AMPLITUDE: 2 V
MDC IDC SET LEADCHNL RV PACING PULSEWIDTH: 0.4 ms
MDC IDC SET LEADCHNL RV SENSING SENSITIVITY: 4 mV
MDC IDC STAT BRADY AP VP PERCENT: 38 %

## 2015-06-13 NOTE — Progress Notes (Signed)
Pacemaker check in clinic. Normal device function. Thresholds, sensing, impedances consistent with previous measurements. Device programmed to maximize longevity. 5 mode switches (<0.1%), all <30sec, no EGMs. 1 high ventricular rate noted, duration ~4sec, markers suggest noise/oversensing as opposed to physiologic rhythm, fixed RV sensitivity at 4.21mV, sensing assurance off. Device programmed at appropriate safety margins. Histogram distribution appropriate for patient activity level. Device programmed to optimize intrinsic conduction. Estimated longevity 9.5 years. Patient education completed. [facility] on 09/22/15 (patient will be on vacation week of 09/15/15), ROV with AS in 01/2016.

## 2015-09-15 ENCOUNTER — Ambulatory Visit: Payer: Medicare Other

## 2015-09-22 ENCOUNTER — Telehealth: Payer: Self-pay | Admitting: Cardiology

## 2015-09-22 ENCOUNTER — Ambulatory Visit (INDEPENDENT_AMBULATORY_CARE_PROVIDER_SITE_OTHER): Payer: Medicare Other | Admitting: *Deleted

## 2015-09-22 DIAGNOSIS — I442 Atrioventricular block, complete: Secondary | ICD-10-CM

## 2015-09-22 NOTE — Telephone Encounter (Signed)
Spoke with pt and reminded pt of remote transmission that is due today. Pt verbalized understanding.   

## 2015-09-22 NOTE — Progress Notes (Signed)
Remote pacemaker transmission.   

## 2015-10-30 LAB — CUP PACEART REMOTE DEVICE CHECK
Battery Impedance: 203 Ohm
Battery Voltage: 2.79 V
Brady Statistic AP VP Percent: 44 %
Brady Statistic AP VS Percent: 0 %
Brady Statistic AS VS Percent: 0 %
Implantable Lead Implant Date: 20131014
Implantable Lead Implant Date: 20131014
Implantable Lead Location: 753859
Implantable Lead Model: 5076
Implantable Lead Model: 5076
Lead Channel Impedance Value: 420 Ohm
Lead Channel Impedance Value: 608 Ohm
Lead Channel Pacing Threshold Pulse Width: 0.4 ms
Lead Channel Sensing Intrinsic Amplitude: 2.8 mV
Lead Channel Setting Pacing Amplitude: 2.5 V
MDC IDC LEAD LOCATION: 753860
MDC IDC MSMT BATTERY REMAINING LONGEVITY: 110 mo
MDC IDC MSMT LEADCHNL RA PACING THRESHOLD AMPLITUDE: 0.375 V
MDC IDC MSMT LEADCHNL RV PACING THRESHOLD AMPLITUDE: 0.625 V
MDC IDC MSMT LEADCHNL RV PACING THRESHOLD PULSEWIDTH: 0.4 ms
MDC IDC SESS DTM: 20170501150154
MDC IDC SET LEADCHNL RA PACING AMPLITUDE: 2 V
MDC IDC SET LEADCHNL RV PACING PULSEWIDTH: 0.4 ms
MDC IDC SET LEADCHNL RV SENSING SENSITIVITY: 4 mV
MDC IDC STAT BRADY AS VP PERCENT: 56 %

## 2015-10-31 ENCOUNTER — Encounter: Payer: Self-pay | Admitting: Cardiology

## 2015-11-14 ENCOUNTER — Encounter: Payer: Self-pay | Admitting: Cardiology

## 2015-12-22 ENCOUNTER — Ambulatory Visit (INDEPENDENT_AMBULATORY_CARE_PROVIDER_SITE_OTHER): Payer: Medicare Other | Admitting: *Deleted

## 2015-12-22 ENCOUNTER — Ambulatory Visit: Payer: Medicare Other | Admitting: *Deleted

## 2015-12-22 DIAGNOSIS — I442 Atrioventricular block, complete: Secondary | ICD-10-CM

## 2015-12-26 ENCOUNTER — Encounter: Payer: Self-pay | Admitting: Cardiology

## 2015-12-30 ENCOUNTER — Encounter: Payer: Self-pay | Admitting: Cardiology

## 2015-12-30 NOTE — Progress Notes (Signed)
Remote pacemaker transmission.   

## 2016-01-01 LAB — CUP PACEART REMOTE DEVICE CHECK
Brady Statistic AP VP Percent: 42 %
Brady Statistic AP VS Percent: 0 %
Brady Statistic AS VP Percent: 58 %
Brady Statistic AS VS Percent: 0 %
Implantable Lead Implant Date: 20131014
Implantable Lead Location: 753860
Implantable Lead Model: 5076
Lead Channel Impedance Value: 608 Ohm
Lead Channel Pacing Threshold Amplitude: 0.375 V
Lead Channel Pacing Threshold Amplitude: 0.5 V
Lead Channel Sensing Intrinsic Amplitude: 2.8 mV
Lead Channel Setting Pacing Amplitude: 2.5 V
Lead Channel Setting Pacing Pulse Width: 0.4 ms
Lead Channel Setting Sensing Sensitivity: 4 mV
MDC IDC LEAD IMPLANT DT: 20131014
MDC IDC LEAD LOCATION: 753859
MDC IDC MSMT BATTERY IMPEDANCE: 227 Ohm
MDC IDC MSMT BATTERY REMAINING LONGEVITY: 107 mo
MDC IDC MSMT BATTERY VOLTAGE: 2.79 V
MDC IDC MSMT LEADCHNL RA IMPEDANCE VALUE: 409 Ohm
MDC IDC MSMT LEADCHNL RA PACING THRESHOLD PULSEWIDTH: 0.4 ms
MDC IDC MSMT LEADCHNL RV PACING THRESHOLD PULSEWIDTH: 0.4 ms
MDC IDC SESS DTM: 20170731172525
MDC IDC SET LEADCHNL RA PACING AMPLITUDE: 2 V

## 2016-01-13 ENCOUNTER — Encounter: Payer: Self-pay | Admitting: Cardiology

## 2016-01-16 ENCOUNTER — Other Ambulatory Visit: Payer: Self-pay | Admitting: Family Medicine

## 2016-01-19 ENCOUNTER — Other Ambulatory Visit: Payer: Self-pay | Admitting: Family Medicine

## 2016-01-19 NOTE — Telephone Encounter (Signed)
Rx refill sent to pharmacy. 

## 2016-01-21 NOTE — Progress Notes (Signed)
Electrophysiology Office Note Date: 01/22/2016  ID:  Omaria, Seguin [birth year], MRN [id number]  PCP: [name], MD Electrophysiologist: Caryl Comes  CC: Pacemaker follow-up  Lindsey Bryant is a 75 y.o. female seen today for Dr Caryl Comes.  She presents today for routine electrophysiology followup.  Since last being seen in our clinic, the patient reports doing very well.  She denies chest pain, palpitations, dyspnea, PND, orthopnea, nausea, vomiting, dizziness, syncope, edema, weight gain, or early satiety.  Device History: MDT dual chamber PPM implanted 2013 for complete heart block    Past Medical History:  Diagnosis Date  . Bradycardia    a. 02/2012  . IBS (irritable bowel syndrome)   . Melanoma ([place])    right arm  . 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 Parkinson's disease    Past Surgical History:  Procedure Laterality Date  . APPENDECTOMY  1952  . MELANOMA EXCISION Right 2000   right arm  . PACEMAKER PLACEMENT  05/2012  . PACEMAKER REVISION N/A 03/14/2012   Procedure: PACEMAKER REVISION;  Surgeon: Thompson Grayer, MD;  Location: [facility] CATH LAB;  Service: Cardiovascular;  Laterality: N/A;  . PERMANENT PACEMAKER INSERTION N/A 03/06/2012   Procedure: PERMANENT PACEMAKER INSERTION;  Surgeon: Deboraha Sprang, MD;  Location: [facility] CATH LAB;  Service: Cardiovascular;  Laterality: N/A;  . 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 aspirin EC 81 MG tablet Take 81 mg by mouth at bedtime.    . carbidopa-levodopa (SINEMET IR) 25-100 MG per tablet Take by mouth 4 (four) times daily. 1.5 tab tid...    . clidinium-chlordiazePOXIDE (LIBRAX) 5-2.5 MG per capsule Take 1 capsule by mouth 3 (three) times daily as needed. 60 capsule 0  . docusate sodium (COLACE) 100 MG capsule Take 100 mg by mouth daily.    Marland Kitchen estrogens, conjugated, (PREMARIN) 0.3 MG tablet Take 1 tablet (0.3 mg total) by mouth at bedtime. 90  tablet 3  . medroxyPROGESTERone (PROVERA) 2.5 MG tablet Take 1 tablet (2.5 mg total) by mouth daily. 90 tablet 3  . MYRBETRIQ 25 MG TB24 tablet TAKE ONE TABLET BY MOUTH ONE TIME DAILY 30 tablet 5  . Omega-3 1000 MG CAPS Take 1 capsule by mouth daily.    . polyethylene glycol (MIRALAX / GLYCOLAX) packet Take 17 g by mouth daily as needed.     No current facility-administered medications for this visit.     Allergies:   Review of patient's allergies indicates no known allergies.   Social History: Social History   Social History  . Marital status: Married    Spouse name: N/A  . Number of children: 2  . Years of education: N/A   Occupational History  . Not on file.   Social History Main Topics  . Smoking status: Former Research scientist (life sciences)  . Smokeless tobacco: Never Used     Comment: 10 yrs in college  . Alcohol use No  . Drug use: No  . Sexual activity: Not on file   Other Topics Concern  . Not on file   Social History Narrative  . No narrative on file    Family History: Family History  Problem Relation Age of Onset  . Heart attack Mother 35    Details unclear  . Alcohol abuse Father   . Diabetes Brother   . Breast cancer    . Hypothyroidism Daughter   . Hypothyroidism Daughter   .  Colon cancer Neg Hx      Review of Systems: All other systems reviewed and are otherwise negative except as noted above.   Physical Exam: VS:  BP (!) 110/58   Pulse 71   Ht 5\' 2"  (1.575 m)   Wt 133 lb 6.4 oz (60.5 kg)   BMI 24.40 kg/m  , BMI Body mass index is 24.4 kg/m.  GEN- The patient is well appearing, alert and oriented x 3 today, +intentional tremor HEENT: normocephalic, atraumatic; sclera clear, conjunctiva pink; hearing intact; oropharynx clear; neck supple Lungs- Clear to ausculation bilaterally, normal work of breathing.  No wheezes, rales, rhonchi Heart- Regular rate and rhythm GI- soft, non-tender, non-distended, bowel sounds present Extremities- no clubbing, cyanosis, or  edema MS- no significant deformity or atrophy Skin- warm and dry, no rash or lesion; PPM pocket well healed Psych- euthymic mood, full affect Neuro- strength and sensation are intact  PPM Interrogation- reviewed in detail today,  See PACEART report  EKG:  EKG is ordered today. The ekg ordered today shows sinus rhythm with ventricular pacing   Recent Labs: No results found for requested labs within last 8760 hours.   Wt Readings from Last 3 Encounters:  01/22/16 133 lb 6.4 oz (60.5 kg)  04/03/15 140 lb 14.4 oz (63.9 kg)  01/28/15 139 lb 6.4 oz (63.2 kg)     Other studies Reviewed: Additional studies/ records that were reviewed today include: Dr Olin Pia office notes  Assessment and Plan:  1.  Complete heart block Normal PPM function See Pace Art report No changes today Enrolled in [employer] today    Current medicines are reviewed at length with the patient today.   The patient does not have concerns regarding her medicines.  The following changes were made today:  none  Labs/ tests ordered today include:  Orders Placed This Encounter  Procedures  . EKG 12-Lead     Disposition:   Follow up with [facility], Dr Caryl Comes 1 year    Signed, Chanetta Marshall, NP 01/22/2016 9:19 AM  [place] [address] [place] [address] [address] [phone] (office) [phone] (fax

## 2016-01-22 ENCOUNTER — Ambulatory Visit (INDEPENDENT_AMBULATORY_CARE_PROVIDER_SITE_OTHER): Payer: Medicare Other | Admitting: Nurse Practitioner

## 2016-01-22 ENCOUNTER — Encounter: Payer: Self-pay | Admitting: Nurse Practitioner

## 2016-01-22 VITALS — BP 110/58 | HR 71 | Ht 62.0 in | Wt 133.4 lb

## 2016-01-22 DIAGNOSIS — I442 Atrioventricular block, complete: Secondary | ICD-10-CM

## 2016-01-22 LAB — CUP PACEART INCLINIC DEVICE CHECK
Date Time Interrogation Session: 20170925161622
Implantable Lead Implant Date: 20131014
Implantable Lead Model: 5076
Implantable Lead Model: 5076
MDC IDC LEAD IMPLANT DT: 20131014
MDC IDC LEAD LOCATION: 753859
MDC IDC LEAD LOCATION: 753860

## 2016-01-22 NOTE — Patient Instructions (Addendum)
Medication Instructions:  Your physician recommends that you continue on your current medications as directed. Please refer to the Current Medication list given to you today.   Labwork: None ordered  Testing/Procedures: None ordered  Follow-Up:  Remote monitoring is used to monitor your Pacemaker of ICD from home. This monitoring reduces the number of office visits required to check your device to one time per year. It allows [place] to keep an eye on the functioning of your device to ensure it is working properly. You are scheduled for a device check from home on 04/22/2016. You may send your transmission at any time that day. If you have a wireless device, the transmission will be sent automatically. After your physician reviews your transmission, you will receive a postcard with your next transmission date.    Your physician wants you to follow-up in: [place].  You will receive a reminder letter in the mail two months in advance. If you don't receive a letter, please call our office to schedule the follow-up appointment.  Any Other Special Instructions Will Be Listed Below (If Applicable).    If you need a refill on your cardiac medications before your next appointment, please call your pharmacy.

## 2016-02-25 ENCOUNTER — Other Ambulatory Visit: Payer: Self-pay

## 2016-02-25 MED ORDER — MIRABEGRON ER 25 MG PO TB24: 25.0000 mg | ORAL_TABLET | Freq: Every day | ORAL | 1 refills | Status: DC

## 2016-04-19 ENCOUNTER — Other Ambulatory Visit: Payer: Self-pay | Admitting: Family Medicine

## 2016-04-22 ENCOUNTER — Ambulatory Visit (INDEPENDENT_AMBULATORY_CARE_PROVIDER_SITE_OTHER): Payer: Medicare Other | Admitting: *Deleted

## 2016-04-22 DIAGNOSIS — I442 Atrioventricular block, complete: Secondary | ICD-10-CM

## 2016-04-23 ENCOUNTER — Telehealth: Payer: Self-pay | Admitting: Cardiology

## 2016-04-23 DIAGNOSIS — I442 Atrioventricular block, complete: Secondary | ICD-10-CM

## 2016-04-23 NOTE — Telephone Encounter (Signed)
Confirmed remote transmission w/ pt husband.   

## 2016-04-28 ENCOUNTER — Other Ambulatory Visit: Payer: Self-pay | Admitting: Family Medicine

## 2016-04-28 NOTE — Telephone Encounter (Signed)
Pt needs a refill on premarin 0.3 mg #84 w/refills call to [place] [facility] [phone] or fax # [phone]

## 2016-04-28 NOTE — Telephone Encounter (Signed)
Pharmacy calling to get a new prescription or verbally given.

## 2016-04-28 NOTE — Telephone Encounter (Signed)
This is in your folder to sign

## 2016-04-29 MED ORDER — ESTROGENS CONJUGATED 0.3 MG PO TABS: 0.3000 mg | ORAL_TABLET | Freq: Every day | ORAL | 3 refills | Status: DC

## 2016-04-29 NOTE — Telephone Encounter (Signed)
Verbally called into the pharmacy.

## 2016-05-04 ENCOUNTER — Encounter: Payer: Self-pay | Admitting: Cardiology

## 2016-05-04 NOTE — Progress Notes (Signed)
Remote pacemaker transmission.   

## 2016-05-19 ENCOUNTER — Encounter: Payer: Self-pay | Admitting: Cardiology

## 2016-05-25 LAB — CUP PACEART REMOTE DEVICE CHECK
Battery Impedance: 227 Ohm
Battery Voltage: 2.79 V
Brady Statistic AP VP Percent: 38 %
Brady Statistic AP VS Percent: 0 %
Brady Statistic AS VP Percent: 62 %
Brady Statistic AS VS Percent: 0 %
Date Time Interrogation Session: 20171201193108
Implantable Lead Implant Date: 20131014
Implantable Lead Location: 753859
Implantable Lead Model: 5076
Implantable Lead Model: 5076
Lead Channel Impedance Value: 404 Ohm
Lead Channel Impedance Value: 593 Ohm
Lead Channel Pacing Threshold Amplitude: 0.375 V
Lead Channel Pacing Threshold Pulse Width: 0.4 ms
Lead Channel Setting Pacing Amplitude: 2.5 V
Lead Channel Setting Pacing Pulse Width: 0.4 ms
Lead Channel Setting Sensing Sensitivity: 4 mV
MDC IDC LEAD IMPLANT DT: 20131014
MDC IDC LEAD LOCATION: 753860
MDC IDC MSMT BATTERY REMAINING LONGEVITY: 108 mo
MDC IDC MSMT LEADCHNL RV PACING THRESHOLD AMPLITUDE: 0.5 V
MDC IDC MSMT LEADCHNL RV PACING THRESHOLD PULSEWIDTH: 0.4 ms
MDC IDC PG IMPLANT DT: 20131014
MDC IDC SET LEADCHNL RA PACING AMPLITUDE: 2 V

## 2016-07-29 ENCOUNTER — Emergency Department (HOSPITAL_COMMUNITY): Payer: Medicare Other

## 2016-07-29 ENCOUNTER — Inpatient Hospital Stay (HOSPITAL_COMMUNITY)
Admission: EM | Admit: 2016-07-29 | Discharge: 2016-08-04 | DRG: 391 | Disposition: A | Discharge status: Home / Self Care | Payer: Medicare Other | Attending: Internal Medicine | Admitting: Internal Medicine

## 2016-07-29 ENCOUNTER — Encounter (HOSPITAL_COMMUNITY): Payer: Self-pay | Admitting: Emergency Medicine

## 2016-07-29 DIAGNOSIS — R778 Other specified abnormalities of plasma proteins: Secondary | ICD-10-CM | POA: Diagnosis present

## 2016-07-29 DIAGNOSIS — K449 Diaphragmatic hernia without obstruction or gangrene: Secondary | ICD-10-CM | POA: Diagnosis present

## 2016-07-29 DIAGNOSIS — I5021 Acute systolic (congestive) heart failure: Secondary | ICD-10-CM | POA: Diagnosis not present

## 2016-07-29 DIAGNOSIS — R739 Hyperglycemia, unspecified: Secondary | ICD-10-CM | POA: Diagnosis present

## 2016-07-29 DIAGNOSIS — Z85820 Personal history of malignant melanoma of skin: Secondary | ICD-10-CM | POA: Diagnosis not present

## 2016-07-29 DIAGNOSIS — Z8249 Family history of ischemic heart disease and other diseases of the circulatory system: Secondary | ICD-10-CM

## 2016-07-29 DIAGNOSIS — R197 Diarrhea, unspecified: Secondary | ICD-10-CM

## 2016-07-29 DIAGNOSIS — R1084 Generalized abdominal pain: Secondary | ICD-10-CM | POA: Diagnosis not present

## 2016-07-29 DIAGNOSIS — Z811 Family history of alcohol abuse and dependence: Secondary | ICD-10-CM

## 2016-07-29 DIAGNOSIS — E876 Hypokalemia: Secondary | ICD-10-CM | POA: Diagnosis present

## 2016-07-29 DIAGNOSIS — Z833 Family history of diabetes mellitus: Secondary | ICD-10-CM

## 2016-07-29 DIAGNOSIS — R7989 Other specified abnormal findings of blood chemistry: Secondary | ICD-10-CM | POA: Diagnosis present

## 2016-07-29 DIAGNOSIS — R1031 Right lower quadrant pain: Secondary | ICD-10-CM | POA: Diagnosis present

## 2016-07-29 DIAGNOSIS — K529 Noninfective gastroenteritis and colitis, unspecified: Secondary | ICD-10-CM | POA: Diagnosis present

## 2016-07-29 DIAGNOSIS — R109 Unspecified abdominal pain: Secondary | ICD-10-CM | POA: Diagnosis present

## 2016-07-29 DIAGNOSIS — K5909 Other constipation: Secondary | ICD-10-CM | POA: Diagnosis present

## 2016-07-29 DIAGNOSIS — K460 Unspecified abdominal hernia with obstruction, without gangrene: Secondary | ICD-10-CM | POA: Diagnosis present

## 2016-07-29 DIAGNOSIS — R1030 Lower abdominal pain, unspecified: Secondary | ICD-10-CM | POA: Diagnosis not present

## 2016-07-29 DIAGNOSIS — Z7982 Long term (current) use of aspirin: Secondary | ICD-10-CM | POA: Diagnosis not present

## 2016-07-29 DIAGNOSIS — D649 Anemia, unspecified: Secondary | ICD-10-CM | POA: Diagnosis present

## 2016-07-29 DIAGNOSIS — R112 Nausea with vomiting, unspecified: Secondary | ICD-10-CM | POA: Diagnosis not present

## 2016-07-29 DIAGNOSIS — G20 Parkinson's disease: Secondary | ICD-10-CM | POA: Diagnosis present

## 2016-07-29 DIAGNOSIS — I214 Non-ST elevation (NSTEMI) myocardial infarction: Secondary | ICD-10-CM | POA: Diagnosis present

## 2016-07-29 DIAGNOSIS — I2510 Atherosclerotic heart disease of native coronary artery without angina pectoris: Secondary | ICD-10-CM | POA: Diagnosis not present

## 2016-07-29 DIAGNOSIS — R933 Abnormal findings on diagnostic imaging of other parts of digestive tract: Secondary | ICD-10-CM | POA: Diagnosis not present

## 2016-07-29 DIAGNOSIS — K921 Melena: Secondary | ICD-10-CM | POA: Diagnosis present

## 2016-07-29 DIAGNOSIS — Z950 Presence of cardiac pacemaker: Secondary | ICD-10-CM | POA: Diagnosis present

## 2016-07-29 DIAGNOSIS — R1032 Left lower quadrant pain: Secondary | ICD-10-CM | POA: Diagnosis present

## 2016-07-29 DIAGNOSIS — G20A1 Parkinson's disease without dyskinesia, without mention of fluctuations: Secondary | ICD-10-CM | POA: Diagnosis present

## 2016-07-29 DIAGNOSIS — E872 Acidosis, unspecified: Secondary | ICD-10-CM | POA: Diagnosis present

## 2016-07-29 DIAGNOSIS — R946 Abnormal results of thyroid function studies: Secondary | ICD-10-CM

## 2016-07-29 DIAGNOSIS — R935 Abnormal findings on diagnostic imaging of other abdominal regions, including retroperitoneum: Secondary | ICD-10-CM

## 2016-07-29 DIAGNOSIS — R748 Abnormal levels of other serum enzymes: Secondary | ICD-10-CM | POA: Diagnosis not present

## 2016-07-29 DIAGNOSIS — K450 Other specified abdominal hernia with obstruction, without gangrene: Secondary | ICD-10-CM | POA: Diagnosis not present

## 2016-07-29 DIAGNOSIS — R1013 Epigastric pain: Secondary | ICD-10-CM | POA: Diagnosis not present

## 2016-07-29 DIAGNOSIS — Z87891 Personal history of nicotine dependence: Secondary | ICD-10-CM | POA: Diagnosis not present

## 2016-07-29 DIAGNOSIS — D696 Thrombocytopenia, unspecified: Secondary | ICD-10-CM | POA: Diagnosis present

## 2016-07-29 HISTORY — DX: Hyperglycemia, unspecified: R73.9

## 2016-07-29 LAB — GLUCOSE, CAPILLARY
GLUCOSE-CAPILLARY: 137 mg/dL — AB (ref 65–99)
Glucose-Capillary: 149 mg/dL — ABNORMAL HIGH (ref 65–99)
Glucose-Capillary: 152 mg/dL — ABNORMAL HIGH (ref 65–99)

## 2016-07-29 LAB — COMPREHENSIVE METABOLIC PANEL
ALT: 12 U/L — ABNORMAL LOW (ref 14–54)
ANION GAP: 11 (ref 5–15)
AST: 22 U/L (ref 15–41)
Albumin: 3.8 g/dL (ref 3.5–5.0)
Alkaline Phosphatase: 41 U/L (ref 38–126)
BILIRUBIN TOTAL: 0.6 mg/dL (ref 0.3–1.2)
BUN: 12 mg/dL (ref 6–20)
CO2: 22 mmol/L (ref 22–32)
Calcium: 9 mg/dL (ref 8.9–10.3)
Chloride: 105 mmol/L (ref 101–111)
Creatinine, Ser: 0.86 mg/dL (ref 0.44–1.00)
GFR calc Af Amer: >60 mL/min (ref >60)
Glucose, Bld: 231 mg/dL — ABNORMAL HIGH (ref 65–99)
POTASSIUM: 3.2 mmol/L — AB (ref 3.5–5.1)
Sodium: 138 mmol/L (ref 135–145)
TOTAL PROTEIN: 6.6 g/dL (ref 6.5–8.1)

## 2016-07-29 LAB — CBC WITH DIFFERENTIAL/PLATELET
BASOS PCT: 0 %
Basophils Absolute: 0 10*3/uL (ref 0.0–0.1)
EOS PCT: 1 %
Eosinophils Absolute: 0.1 10*3/uL (ref 0.0–0.7)
HEMATOCRIT: 42.1 % (ref 36.0–46.0)
Hemoglobin: 14 g/dL (ref 12.0–15.0)
LYMPHS PCT: 16 %
Lymphs Abs: 3.3 10*3/uL (ref 0.7–4.0)
MCH: 33.1 pg (ref 26.0–34.0)
MCHC: 33.3 g/dL (ref 30.0–36.0)
MCV: 99.5 fL (ref 78.0–100.0)
MONO ABS: 0.8 10*3/uL (ref 0.1–1.0)
MONOS PCT: 4 %
NEUTROS ABS: 16.7 10*3/uL — AB (ref 1.7–7.7)
Neutrophils Relative %: 79 %
Platelets: 162 10*3/uL (ref 150–400)
RBC: 4.23 MIL/uL (ref 3.87–5.11)
RDW: 12.5 % (ref 11.5–15.5)
WBC: 20.9 10*3/uL — ABNORMAL HIGH (ref 4.0–10.5)

## 2016-07-29 LAB — LIPASE, BLOOD: Lipase: 27 U/L (ref 11–51)

## 2016-07-29 LAB — URINALYSIS, ROUTINE W REFLEX MICROSCOPIC
Bilirubin Urine: NEGATIVE
GLUCOSE, UA: 150 mg/dL — AB
HGB URINE DIPSTICK: NEGATIVE
Ketones, ur: NEGATIVE mg/dL
Leukocytes, UA: NEGATIVE
Nitrite: NEGATIVE
PH: 5 (ref 5.0–8.0)
Protein, ur: NEGATIVE mg/dL

## 2016-07-29 LAB — I-STAT TROPONIN, ED: TROPONIN I, POC: 0.1 ng/mL — AB (ref 0.00–0.08)

## 2016-07-29 LAB — PROTIME-INR
INR: 1.05
Prothrombin Time: 13.8 seconds (ref 11.4–15.2)

## 2016-07-29 LAB — I-STAT CG4 LACTIC ACID, ED: LACTIC ACID, VENOUS: 4.31 mmol/L — AB (ref 0.5–1.9)

## 2016-07-29 LAB — MRSA PCR SCREENING: MRSA by PCR: NEGATIVE

## 2016-07-29 LAB — TSH: TSH: 6.519 u[IU]/mL — ABNORMAL HIGH (ref 0.350–4.500)

## 2016-07-29 LAB — OCCULT BLOOD X 1 CARD TO LAB, STOOL: Fecal Occult Bld: POSITIVE — AB

## 2016-07-29 LAB — T4, FREE: Free T4: 0.84 ng/dL (ref 0.61–1.12)

## 2016-07-29 LAB — TROPONIN I
TROPONIN I: 1.95 ng/mL — AB (ref <0.03)
TROPONIN I: 1.58 ng/mL — AB (ref <0.03)

## 2016-07-29 LAB — LACTIC ACID, PLASMA
LACTIC ACID, VENOUS: 4.2 mmol/L — AB (ref 0.5–1.9)
LACTIC ACID, VENOUS: 4.2 mmol/L — AB (ref 0.5–1.9)
LACTIC ACID, VENOUS: 3.9 mmol/L — AB (ref 0.5–1.9)

## 2016-07-29 MED ORDER — SODIUM CHLORIDE 0.9 % IV BOLUS (SEPSIS)
1000.0000 mL | Freq: Once | INTRAVENOUS | Status: AC
  Administered 2016-07-29: 1000 mL via INTRAVENOUS

## 2016-07-29 MED ORDER — HYDROMORPHONE HCL 2 MG/ML IJ SOLN
1.0000 mg | Freq: Once | INTRAMUSCULAR | Status: AC
  Administered 2016-07-29: 1 mg via INTRAVENOUS
  Filled 2016-07-29: qty 1

## 2016-07-29 MED ORDER — HEPARIN SODIUM (PORCINE) 5000 UNIT/ML IJ SOLN: 5000.0000 [IU] | Freq: Three times a day (TID) | INTRAMUSCULAR | Status: DC

## 2016-07-29 MED ORDER — CARBIDOPA-LEVODOPA 25-100 MG PO TABS
2.0000 | ORAL_TABLET | Freq: Four times a day (QID) | ORAL | Status: DC
  Administered 2016-07-29 – 2016-08-04 (×20): 2 via ORAL
  Filled 2016-07-29 (×28): qty 2

## 2016-07-29 MED ORDER — IOPAMIDOL (ISOVUE-300) INJECTION 61%
INTRAVENOUS | Status: AC
  Filled 2016-07-29: qty 100

## 2016-07-29 MED ORDER — POTASSIUM CHLORIDE IN NACL 20-0.9 MEQ/L-% IV SOLN
INTRAVENOUS | Status: DC
  Administered 2016-07-29: 14:00:00 via INTRAVENOUS
  Filled 2016-07-29 (×2): qty 1000

## 2016-07-29 MED ORDER — MORPHINE SULFATE (PF) 4 MG/ML IV SOLN
4.0000 mg | Freq: Once | INTRAVENOUS | Status: AC
  Administered 2016-07-29: 4 mg via INTRAVENOUS
  Filled 2016-07-29: qty 1

## 2016-07-29 MED ORDER — INSULIN ASPART 100 UNIT/ML ~~LOC~~ SOLN
0.0000 [IU] | SUBCUTANEOUS | Status: DC
  Administered 2016-07-29: 2 [IU] via SUBCUTANEOUS
  Administered 2016-07-29 – 2016-07-31 (×3): 1 [IU] via SUBCUTANEOUS

## 2016-07-29 MED ORDER — PIPERACILLIN-TAZOBACTAM 3.375 G IVPB
3.3750 g | Freq: Three times a day (TID) | INTRAVENOUS | Status: DC
  Administered 2016-07-29 – 2016-07-31 (×7): 3.375 g via INTRAVENOUS
  Filled 2016-07-29 (×12): qty 50

## 2016-07-29 MED ORDER — ONDANSETRON HCL 4 MG PO TABS
4.0000 mg | ORAL_TABLET | Freq: Four times a day (QID) | ORAL | Status: DC | PRN
  Administered 2016-07-30: 4 mg via ORAL
  Filled 2016-07-29: qty 1

## 2016-07-29 MED ORDER — SODIUM CHLORIDE 0.9% FLUSH
3.0000 mL | Freq: Two times a day (BID) | INTRAVENOUS | Status: DC
  Administered 2016-07-29 – 2016-08-04 (×10): 3 mL via INTRAVENOUS

## 2016-07-29 MED ORDER — IOPAMIDOL (ISOVUE-300) INJECTION 61%
100.0000 mL | Freq: Once | INTRAVENOUS | Status: AC | PRN
  Administered 2016-07-29: 100 mL via INTRAVENOUS

## 2016-07-29 MED ORDER — PROCHLORPERAZINE EDISYLATE 5 MG/ML IJ SOLN
10.0000 mg | Freq: Once | INTRAMUSCULAR | Status: AC
  Administered 2016-07-29: 10 mg via INTRAVENOUS
  Filled 2016-07-29: qty 2

## 2016-07-29 MED ORDER — INSULIN ASPART 100 UNIT/ML ~~LOC~~ SOLN: 0.0000 [IU] | Freq: Every day | SUBCUTANEOUS | Status: DC

## 2016-07-29 MED ORDER — ONDANSETRON HCL 4 MG/2ML IJ SOLN
4.0000 mg | Freq: Four times a day (QID) | INTRAMUSCULAR | Status: DC | PRN
  Administered 2016-07-29 – 2016-07-31 (×3): 4 mg via INTRAVENOUS
  Filled 2016-07-29 (×3): qty 2

## 2016-07-29 MED ORDER — SODIUM CHLORIDE 0.9 % IV BOLUS (SEPSIS)
1000.0000 mL | Freq: Once | INTRAVENOUS | Status: AC
  Administered 2016-07-30: 1000 mL via INTRAVENOUS

## 2016-07-29 MED ORDER — ACETAMINOPHEN 650 MG RE SUPP: 650.0000 mg | Freq: Four times a day (QID) | RECTAL | Status: DC | PRN

## 2016-07-29 MED ORDER — POTASSIUM CHLORIDE IN NACL 20-0.9 MEQ/L-% IV SOLN
INTRAVENOUS | Status: DC
  Administered 2016-07-30: via INTRAVENOUS
  Filled 2016-07-29 (×2): qty 1000

## 2016-07-29 MED ORDER — PIPERACILLIN-TAZOBACTAM 3.375 G IVPB 30 MIN
3.3750 g | Freq: Once | INTRAVENOUS | Status: AC
  Administered 2016-07-29: 3.375 g via INTRAVENOUS
  Filled 2016-07-29: qty 50

## 2016-07-29 MED ORDER — MORPHINE SULFATE (PF) 4 MG/ML IV SOLN: 1.0000 mg | INTRAVENOUS | Status: DC | PRN

## 2016-07-29 MED ORDER — ACETAMINOPHEN 325 MG PO TABS: 650.0000 mg | ORAL_TABLET | Freq: Four times a day (QID) | ORAL | Status: DC | PRN

## 2016-07-29 MED ORDER — POTASSIUM CHLORIDE CRYS ER 20 MEQ PO TBCR: 40.0000 meq | EXTENDED_RELEASE_TABLET | Freq: Once | ORAL | Status: DC

## 2016-07-29 MED ORDER — INSULIN ASPART 100 UNIT/ML ~~LOC~~ SOLN: 0.0000 [IU] | Freq: Three times a day (TID) | SUBCUTANEOUS | Status: DC

## 2016-07-29 NOTE — ED Notes (Signed)
ED Provider at bedside. 

## 2016-07-29 NOTE — ED Notes (Signed)
General Surgery At bedside speaking to pt. Plan to follow up with MD tsuia and pt is to be NPO until plan determined.

## 2016-07-29 NOTE — Consult Note (Signed)
CONSULT NOTE  Date: 07/29/2016               Patient Name:  Lindsey Bryant MRN: [id number]  DOB: Mar 13, 1941 Age / Sex: 76 y.o., female        PCP: [name] Primary Cardiologist: Caryl Comes             Referring Physician: Marily Memos              Reason for Consult: + troponins in the setting of mesenteric ischemia            History of Present Illness: Patient is a 76 y.o. female with a PMHx of 76 y.o. female with medical history significant for parkinsons, irritable bowel syndrome, complete heart block s/p pacer history the emergency department with the chief complaint of abdominal pain nausea and vomiting, who was admitted to [facility] on 07/29/2016 for evaluation of nausea and vomitting   Was noted to have + troponin levels.   We were called to evaluate   No recent CP No blood in stool.  No coffee ground emesis. Has chronic constipation   Was brought in by EMS.   Their monitor showed possible ventricular tachycardia but interrigation of her device did not confirm that.   I assume it was artifact.   She has never had any episodes of angina. She has not had a stress test. Her last echocardiogram was in May, 2014 which revealed normal left ventricular systolic function.  She has a pacemaker and is followed by Dr. Caryl Comes. Her major limitations are from Parkinson's and the Parkinson's medications.    Medications: Outpatient medications: Prescriptions Prior to Admission  Medication Sig Dispense Refill Last Dose  . aspirin EC 81 MG tablet Take 81 mg by mouth at bedtime.   07/28/2016 at Unknown time  . carbidopa-levodopa (SINEMET IR) 25-100 MG per tablet Take 2 tablets by mouth 4 (four) times daily.    07/28/2016 at Unknown time  . Cholecalciferol (VITAMIN D3) 2000 units capsule Take 2,000 Units by mouth daily.   07/28/2016 at Unknown time  . clidinium-chlordiazePOXIDE (LIBRAX) 5-2.5 MG per capsule Take 1 capsule by mouth 3 (three) times daily as needed. 60 capsule 0 Past Week at  Unknown time  . docusate sodium (COLACE) 100 MG capsule Take 100 mg by mouth daily.   07/28/2016 at Unknown time  . estrogens, conjugated, (PREMARIN) 0.3 MG tablet Take 1 tablet (0.3 mg total) by mouth at bedtime. 90 tablet 3 07/28/2016 at Unknown time  . medroxyPROGESTERone (PROVERA) 2.5 MG tablet TAKE 1 TABLET (2.5 MG TOTAL) BY MOUTH DAILY. 90 tablet 3 07/28/2016 at Unknown time  . mirabegron ER (MYRBETRIQ) 25 MG TB24 tablet Take 1 tablet (25 mg total) by mouth daily. 90 tablet 1 07/28/2016 at Unknown time  . Omega-3 1000 MG CAPS Take 1 capsule by mouth daily.   07/28/2016 at Unknown time  . polyethylene glycol (MIRALAX / GLYCOLAX) packet Take 17 g by mouth daily as needed.   Past Week at Unknown time  . tobramycin (TOBREX) 0.3 % ophthalmic ointment Place 1 application into the right eye 3 (three) times daily.   07/28/2016 at Unknown time    Current medications: Current Facility-Administered Medications  Medication Dose Route Frequency Provider Last Rate Last Dose  . 0.9 % NaCl with KCl 20 mEq/ L  infusion   Intravenous Continuous Radene Gunning, NP 75 mL/hr at 07/29/16 1414    . acetaminophen (TYLENOL) tablet 650 mg  650  mg Oral Q6H PRN Radene Gunning, NP       Or  . acetaminophen (TYLENOL) suppository 650 mg  650 mg Rectal Q6H PRN Radene Gunning, NP      . carbidopa-levodopa (SINEMET IR) 25-100 MG per tablet immediate release 2 tablet  2 tablet Oral QID Radene Gunning, NP   2 tablet at 07/29/16 1413  . insulin aspart (novoLOG) injection 0-9 Units  0-9 Units Subcutaneous Q4H Lezlie Octave Black, NP      . iopamidol (ISOVUE-300) 61 % injection           . morphine 4 MG/ML injection 1 mg  1 mg Intravenous Q3H PRN Radene Gunning, NP      . ondansetron [facility]) tablet 4 mg  4 mg Oral Q6H PRN Radene Gunning, NP       Or  . ondansetron [facility]) injection 4 mg  4 mg Intravenous Q6H PRN Radene Gunning, NP   4 mg at 07/29/16 1212  . piperacillin-tazobactam (ZOSYN) IVPB 3.375 g  3.375 g Intravenous Q8H Romona Curls, [facility]       . potassium chloride SA (K-DUR,KLOR-CON) CR tablet 40 mEq  40 mEq Oral Once Ledell Noss, MD      . sodium chloride flush (NS) 0.9 % injection 3 mL  3 mL Intravenous Q12H Radene Gunning, NP   3 mL at 07/29/16 1414     No Known Allergies   Past Medical History:  Diagnosis Date  . Bradycardia    a. 02/2012  . Hyperglycemia   . IBS (irritable bowel syndrome)   . Melanoma ([place])    right arm  . 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 Parkinson's disease     Past Surgical History:  Procedure Laterality Date  . APPENDECTOMY  1952  . MELANOMA EXCISION Right 2000   right arm  . PACEMAKER PLACEMENT  05/2012  . PACEMAKER REVISION N/A 03/14/2012   Procedure: PACEMAKER REVISION;  Surgeon: Thompson Grayer, MD;  Location: [facility] CATH LAB;  Service: Cardiovascular;  Laterality: N/A;  . PERMANENT PACEMAKER INSERTION N/A 03/06/2012   Procedure: PERMANENT PACEMAKER INSERTION;  Surgeon: Deboraha Sprang, MD;  Location: [facility] CATH LAB;  Service: Cardiovascular;  Laterality: N/A;  . 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 Alcohol abuse Father   . Hypothyroidism Daughter   . Heart attack Mother 58    Details unclear  . Diabetes Brother   . Breast cancer    . Hypothyroidism Daughter   . Colon cancer Neg Hx     Social History:  reports that she has quit smoking. She has never used smokeless tobacco. She reports that she does not drink alcohol or use drugs.   Review of Systems: Constitutional:  denies fever, chills, diaphoresis, appetite change and fatigue.  HEENT: denies photophobia, eye pain, redness, hearing loss, ear pain, congestion, sore throat, rhinorrhea, sneezing, neck pain, neck stiffness and tinnitus.  Respiratory: denies SOB, DOE, cough, chest tightness, and wheezing.  Cardiovascular: denies chest pain, palpitations and leg swelling.  Gastrointestinal: admits to nausea, vomiting, abdominal pain, diarrhea, constipation, blood in  stool.  Genitourinary: denies dysuria, urgency, frequency, hematuria, flank pain and difficulty urinating.  Musculoskeletal: denies  myalgias, back pain, joint swelling, arthralgias and gait problem.   Skin: denies pallor, rash and wound.  Neurological: denies dizziness, seizures, syncope, weakness, light-headedness, numbness and headaches.   Hematological: denies adenopathy,  easy bruising, personal or family bleeding history.  Psychiatric/ Behavioral: denies suicidal ideation, mood changes, confusion, nervousness, sleep disturbance and agitation.    Physical Exam: BP 126/87 (BP Location: Right Arm)   Pulse 94   Temp 97.9 F (36.6 C) (Oral)   Resp 18   Ht 5\' 2"  (1.575 m)   Wt 136 lb 7.4 oz (61.9 kg)   SpO2 100%   BMI 24.96 kg/m   Wt Readings from Last 3 Encounters:  07/29/16 136 lb 7.4 oz (61.9 kg)  01/22/16 133 lb 6.4 oz (60.5 kg)  04/03/15 140 lb 14.4 oz (63.9 kg)    General: Vital signs reviewed and noted. Well-developed, well-nourished, in no acute distress; alert,   Head: Normocephalic, atraumatic, sclera anicteric,   Neck: Supple. Negative for carotid bruits. No JVD   Lungs:  Clear bilaterally, no  wheezes, rales, or rhonchi. Breathing is normal   Heart: RRR with S1 S2. No murmurs, rubs, or gallops   Abdomen/ GI :  Few BS,  Tender RLQ   MSK: Strength and the appear normal for age.   Extremities: No clubbing or cyanosis. No edema.  Distal pedal pulses are 2+ and equal   Neurologic:  CN are grossly intact,  No obvious motor or sensory defect.  Alert and oriented X 3. Moves all extremities spontaneously.  Psych: Responds to questions appropriately with a normal affect.     Lab results: Basic Metabolic Panel:  Recent Labs Lab 07/29/16 0731  NA 138  K 3.2*  CL 105  CO2 22  GLUCOSE 231*  BUN 12  CREATININE 0.86  CALCIUM 9.0    Liver Function Tests:  Recent Labs Lab 07/29/16 0731  AST 22  ALT 12*  ALKPHOS 41  BILITOT 0.6  PROT 6.6  ALBUMIN 3.8     Recent Labs Lab 07/29/16 0731  LIPASE 27   No results for input(s): AMMONIA in the last 168 hours.  CBC:  Recent Labs Lab 07/29/16 0731  WBC 20.9*  NEUTROABS 16.7*  HGB 14.0  HCT 42.1  MCV 99.5  PLT 162    Cardiac Enzymes:  Recent Labs Lab 07/29/16 1400  TROPONINI 1.95*    BNP: Invalid input(s): POCBNP  CBG:  Recent Labs Lab 07/29/16 1618  GLUCAP 149*    Coagulation Studies:  Recent Labs  07/29/16 1400  LABPROT 13.8  INR 1.05     Other results: Personal review of EKG shows :  - normal Sinus rhythm. She has no ST or T wave changes.  Imaging: Ct Abdomen Pelvis W Contrast  Result Date: 07/29/2016 CLINICAL DATA:  Abdominal pain, nausea, vomiting and diarrhea. EXAM: CT ABDOMEN AND PELVIS WITH CONTRAST TECHNIQUE: Multidetector CT imaging of the abdomen and pelvis was performed using the standard protocol following bolus administration of intravenous contrast. CONTRAST:  100 ml ISOVUE-300 IOPAMIDOL (ISOVUE-300) INJECTION 61% COMPARISON:  None. FINDINGS: Lower chest: No pleural or pericardial effusion. Mild dependent atelectasis is noted. Heart size is normal. Hepatobiliary: The liver is diffusely low attenuating consistent with fatty infiltration. No focal lesion. The gallbladder and biliary tree appear normal. Pancreas: Unremarkable. No pancreatic ductal dilatation or surrounding inflammatory changes. Spleen: Normal in size without focal abnormality. Adrenals/Urinary Tract: Adrenal glands are unremarkable. Kidneys are normal, without renal calculi, focal lesion, or hydronephrosis. Bladder is unremarkable. Stomach/Bowel: Multiple loops of small bowel in the right abdomen and pelvis demonstrate wall thickening and decreased enhancement. There is interloop fluid and a small volume of free pelvic fluid. No evidence of small bowel  obstruction. The colon appears normal. Small hiatal hernia is seen. No pneumatosis, portal venous gas or free intraperitoneal air is  identified. Vascular/Lymphatic: No significant vascular findings are present. No enlarged abdominal or pelvic lymph nodes. Reproductive: Uterus and bilateral adnexa are unremarkable. Other: None. Musculoskeletal: No fracture or worrisome lesion. IMPRESSION: Abnormal appearance of multiple loops of small bowel is described above could be due to severe enteritis but bowel ischemia is within the differential. Fatty infiltration of the liver. These results were called by telephone at the time of interpretation on 07/29/2016 at 9:47 am to Dr. Shirlyn Goltz , who verbally acknowledged these results. Electronically Signed   By: Inge Rise M.D.   On: 07/29/2016 09:50   Dg Abd Acute W/chest  Result Date: 07/29/2016 CLINICAL DATA:  Low to mid abdomen pain, nausea and vomit EXAM: DG ABDOMEN ACUTE W/ 1V CHEST COMPARISON:  Chest x-ray of 03/15/2012 FINDINGS: No active infiltrate or effusion is seen. Mediastinal and hilar contours are unremarkable. The heart is borderline enlarged any tool lead permanent pacemaker remains. Supine and left the decubitus films of the abdomen were obtained. No bowel obstruction is seen, with a relative paucity of bowel gas. No free air is seen on the decubitus image. No opaque calculi are seen. A rounded calcification is present in the left bony pelvis possibly a phlebolith, with bladder calculus unlikely. IMPRESSION: 1. No active lung disease. Stable borderline cardiomegaly with permanent pacemaker. 2. No bowel obstruction or free air. 3. Calcification in the left bony pelvis could be a phlebolith, with bladder calculus less likely. Electronically Signed   By: Ivar Drape M.D.   On: 07/29/2016 08:35      *    Assessment & Plan:  1. Elevated troponin: She does not have a history of coronary artery disease. She has never had angina. She now presents with mesenteric ischemia, elevated white count and severe abdominal pain. She has a random troponin level that is elevated. She has no ST  or T wave changes. At this time I do not think that this is of any clinical significance. This is likely due to demand ischemia. Once she is better, we can consider doing stress testing but at this point there is no evidence that this is an acute coronary syndrome.   Because of her ischemic bowel concerns, she should not receive heparin. She is also not a candidate for invasive procedures at this time. Fortunately, she is asymptomatic and we do not need to consider these.    Thayer Headings, Brooke Bonito., MD, [facility] 07/29/2016, 4:58 PM Office - [phone] Pager [phone]

## 2016-07-29 NOTE — ED Notes (Signed)
Warm blanket applied

## 2016-07-29 NOTE — ED Triage Notes (Signed)
BIB EMS from home, called out for gen abd that radiates to back, with N/V/D. Pt noted to be in vtach when EMS hooked pt up to monitor, pt remained in vtach for approx 10 min, denied any CP/SOB. Converted back to paced rhythm en route (medtronic pacemaker, no ICD). Pt was given 8 zofran and 200 fentanyl. Pain went from 10/10 to 7/10. Pt noted to be pale and weak. A/Ox4.

## 2016-07-29 NOTE — ED Notes (Signed)
FT3 not enough.

## 2016-07-29 NOTE — ED Notes (Signed)
Pt transported to xray 

## 2016-07-29 NOTE — Progress Notes (Signed)
CRITICAL VALUE ALERT  Critical value received: troponin 1.95  Date of notification:  07/29/16  Time of notification:  1448  Critical value read back: yes  Nurse who received alert: Asante Blanda  MD notified (1st page): cardiologist at bedside  Time of first page: 17  RN in process of paging attending when cardiologist arrived on unit to see patient.

## 2016-07-29 NOTE — Progress Notes (Signed)
Pharmacy Antibiotic Note  Lindsey Bryant is a 76 y.o. female admitted on 07/29/2016 with intra-abdominal infection.  Pharmacy has been consulted for Zosyn dosing. Afebrile, wbc 20.9, LA 4.31. SCr 0.81 on admit, CrCl~45.  Zosyn 3.375g IV (72min infusion) already given x 1 in the ED at 0903.  Plan: Zosyn 3.375g IV q8h (4h inf) Monitor clinical progress, c/s, renal function, abx plan/LOT   Height: 5\' 2"  (157.5 cm) Weight: 130 lb (59 kg) IBW/kg (Calculated) : 50.1  Temp (24hrs), Avg:96 F (35.6 C), Min:96 F (35.6 C), Max:96 F (35.6 C)   Recent Labs Lab 07/29/16 0731 07/29/16 0757  WBC 20.9*  --   CREATININE 0.86  --   LATICACIDVEN  --  4.31*    Estimated Creatinine Clearance: 45.4 mL/min (by C-G formula based on SCr of 0.86 mg/dL).    No Known Allergies   Elicia Lamp, PharmD, BCPS Clinical Pharmacist 07/29/2016 11:32 AM

## 2016-07-29 NOTE — Progress Notes (Signed)
@  2335 Paged Lurlean Leyden of [facility] regarding pt's Critical Lactic Acid of 3.9 down from 4.2. Bolus and additional maintenance IVF ordered along with an AM repeat LA level. Will continue to monitor.

## 2016-07-29 NOTE — ED Notes (Signed)
Mouth swabs given for comfort; pt appears comfortable; no further needs.

## 2016-07-29 NOTE — ED Notes (Signed)
CRITICAL VALUE ALERT  Critical value received:  Lactic Acid 4.2  Date of notification:  07/29/2016  Time of notification:  1337  Critical value read back: yes  Nurse who received alert:  Earleen Newport RN

## 2016-07-29 NOTE — ED Provider Notes (Signed)
[facility] DEPT Provider Note   CSN: [id number] Arrival date & time: 07/29/16  3976     History   Chief Complaint Chief Complaint  Patient presents with  . Abdominal Pain    HPI Lindsey Bryant is a 76 y.o. female.  76 year old female with past medical history complete heart block s/p pacemaker, IBS, s/p appendectomy who presents with abdominal pain. The abdominal pain began suddenly at 4 am this morning and woke her from sleep. She had 6 loose bowel movements and took Librax. Afterward she developed lower abdominal pain and lower back pain and became nauseous. She denies chest pressure or pain, palpitations, shortness of breath. EMS was to called, initial EKG showed V. tach but this abnormal rhythm resolved during transportation without intervention. She has not felt her pacemaker go off.        Past Medical History:  Diagnosis Date  . Bradycardia    a. 02/2012  . IBS (irritable bowel syndrome)   . Melanoma ([place])    right arm  . 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 Parkinson's disease     Patient Active Problem List   Diagnosis Date Noted  . Dyspnea on exertion 08/22/2012  . Fatigue 05/30/2012  . 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 MENOPAUSAL SYNDROME 09/17/2009  . 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 PARKINSON'S 11/21/2006  . Irritable bowel syndrome 11/21/2006    Past Surgical History:  Procedure Laterality Date  . APPENDECTOMY  1952  . MELANOMA EXCISION Right 2000   right arm  . PACEMAKER PLACEMENT  05/2012  . PACEMAKER REVISION N/A 03/14/2012   Procedure: PACEMAKER REVISION;  Surgeon: Thompson Grayer, MD;  Location: [facility] CATH LAB;  Service: Cardiovascular;  Laterality: N/A;  . PERMANENT PACEMAKER INSERTION N/A 03/06/2012   Procedure: PERMANENT PACEMAKER INSERTION;  Surgeon: Deboraha Sprang, MD;  Location: [facility] CATH LAB;  Service: Cardiovascular;  Laterality: N/A;  . 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     OB History    No data available       Home Medications    Prior to Admission medications   Medication Sig Start Date End Date Taking? Authorizing Provider  aspirin EC 81 MG tablet Take 81 mg by mouth at bedtime.    Historical Provider, MD  carbidopa-levodopa (SINEMET IR) 25-100 MG per tablet Take by mouth 4 (four) times daily. 1.5 tab tid...    Historical Provider, MD  clidinium-chlordiazePOXIDE (LIBRAX) 5-2.5 MG per capsule Take 1 capsule by mouth 3 (three) times daily as needed. 01/18/14   [name], MD  docusate sodium (COLACE) 100 MG capsule Take 100 mg by mouth daily.    Historical Provider, MD  estrogens, conjugated, (PREMARIN) 0.3 MG tablet Take 1 tablet (0.3 mg total) by mouth at bedtime. 04/29/16   [name], MD  medroxyPROGESTERone (PROVERA) 2.5 MG tablet TAKE 1 TABLET (2.5 MG TOTAL) BY MOUTH DAILY. 04/19/16   [name], MD  mirabegron ER (MYRBETRIQ) 25 MG TB24 tablet Take 1 tablet (25 mg total) by mouth daily. 02/25/16   [name], MD  Omega-3 1000 MG CAPS Take 1 capsule by mouth daily.    Historical Provider, MD  polyethylene glycol (MIRALAX / GLYCOLAX) packet Take 17 g by mouth daily as needed.    Historical Provider, MD    Family History Family History  Problem Relation Age of Onset  . Alcohol abuse Father   . Hypothyroidism Daughter   .  Heart attack Mother 57    Details unclear  . Diabetes Brother   . Breast cancer    . Hypothyroidism Daughter   . Colon cancer Neg Hx     Social History Social History  Substance Use Topics  . Smoking status: Former Research scientist (life sciences)  . Smokeless tobacco: Never Used     Comment: 10 yrs in college  . Alcohol use No     Allergies   Patient has no known allergies.   Review of Systems Review of Systems  Respiratory: Negative for chest tightness and shortness of breath.   Cardiovascular: Negative for chest pain, palpitations and leg swelling.  Gastrointestinal: Positive for abdominal pain, diarrhea  and nausea.  Musculoskeletal: Positive for back pain.  All other systems reviewed and are negative.  Physical Exam Updated Vital Signs BP 133/87   Pulse 64   Temp (!) 96 F (35.6 C) (Rectal)   Resp 19   Ht 5\' 2"  (1.575 m)   Wt 59 kg   SpO2 98%   BMI 23.78 kg/m   Physical Exam  Constitutional: She appears well-developed and well-nourished. No distress.  HENT:  Head: Normocephalic and atraumatic.  Eyes: Conjunctivae are normal. No scleral icterus.  Neck: Normal range of motion.  Cardiovascular: Normal rate and regular rhythm.   No murmur heard. Pulmonary/Chest: Effort normal. No respiratory distress. She has no wheezes. She has no rales.  Abdominal: Soft. She exhibits no distension. There is tenderness.  Periumbilical and RUQ abdominal pain   Neurological: She is alert.  Skin: Skin is warm and dry. She is not diaphoretic.  Psychiatric: She has a normal mood and affect. Her behavior is normal.     ED Treatments / Results  Labs (all labs ordered are listed, but only abnormal results are displayed) Labs Reviewed  CBC WITH DIFFERENTIAL/PLATELET - Abnormal; Notable for the following:       Result Value   WBC 20.9 (*)    Neutro Abs 16.7 (*)    All other components within normal limits  CULTURE, BLOOD (ROUTINE X 2)  CULTURE, BLOOD (ROUTINE X 2)  COMPREHENSIVE METABOLIC PANEL  TSH  LIPASE, BLOOD  URINALYSIS, ROUTINE W REFLEX MICROSCOPIC  I-STAT TROPOININ, ED  I-STAT CG4 LACTIC ACID, ED    EKG  EKG Interpretation  Date/Time:  Thursday July 29 2016 06:56:23 EST Ventricular Rate:  60 PR Interval:    QRS Duration: 168 QT Interval:  469 QTC Calculation: 469 R Axis:   12 Text Interpretation:  Sinus rhythm Left bundle branch block Baseline wander in lead(s) II III aVL aVF previous tracing paced  Confirmed by YAO  MD, DAVID ([id number]) on 07/29/2016 7:38:36 AM       Radiology No results found.  Procedures Procedures (including critical care time)  Medications  Ordered in ED Medications  sodium chloride 0.9 % bolus 1,000 mL (not administered)  morphine 4 MG/ML injection 4 mg (not administered)     Initial Impression / Assessment and Plan / ED Course  I have reviewed the triage vital signs and the nursing notes.  Pertinent labs & imaging results that were available during my care of the patient were reviewed by me and considered in my medical decision making (see chart for details).   76 year old woman with PMH complete heart block s/p pacemaker, IBS and S/p appendectomy who presents with abdominal pain and was found to have vtach.  Vtach readings resolved during transport and she denies any symptoms of Dictation, chest pain or shortness  of breath. This may have been a false reading in the setting of her complete heart block. Pacemaker interdigitated showed abnormalities of some slowing that was not on prior investigation. Initial troponin was elevated 0.1. Cardiology consulted.   Abdominal pain with sudden onset could be related to is concerning for biliary colic, renal stones, or perforated ulcer. CMP revealed a lipase of 27, glucose 2:30, K3.2 with LFTs within normal limits. CBC revealed leukocytosis 20.9 with neutrophil predominance. Lactic acid 4.3 and blood cultures were drawn. Chest x-ray showed no acute cardiopulmonary abnormalities. Abdominal x-ray showed no free air under the diaphragm. TSH 6.5 Given a dose of IV zosyn.   10:30 CT abdomen and pelvis revealed fatty infiltrate of the liver with normal appearing gallbladder, biliary tree, and pancreas. Bowel wall thickening and a small volume of free pelvic fluid . Surgery consulted.    Final Clinical Impressions(s) / ED Diagnoses   Final diagnoses:  None    New Prescriptions New Prescriptions   No medications on file     Ledell Noss, MD 07/29/16 [place] Yao, MD 07/31/16 [phone]

## 2016-07-29 NOTE — Consult Note (Signed)
[facility]  Consult Note  Requesting Physician: Dr. Allie Bossier  Reason for consult: Abdominal Pain. Ischemic bowel vs. Enteritis  76 y/o F who presented to the ER for evaluation of abdominal pain and nausea that woke her from sleep at 400am this morning. Pain described as intense and across the entire portion of her lower abdomen. Shortly after waking with pain she had several soft, "like pudding," bowel movements without evidence of BRBPR or melena. In ER, patient hemodynamically stable, afebrile and resting comfortably in bed. WBC 20.9K and lactate 4.31. Hx of open appendectomy in 1952. CT A/P: Stomach/Bowel: Multiple loops of small bowel in the right abdomen and pelvis demonstrate wall thickening and decreased enhancement. There is interloop fluid and a small volume of free pelvic fluid. No evidence of small bowel obstruction. The colon appears normal. Small hiatal hernia is seen. No pneumatosis, portal venous gas or free intraperitoneal air is identified. IMPRESSION: Abnormal appearance of multiple loops of small bowel is described above could be due to severe enteritis but bowel ischemia is within the differential.  Objective: Vital signs in last 24 hours: Temp:  [96 F (35.6 C)] 96 F (35.6 C) (03/08 0657) Pulse Rate:  [64-102] 64 (03/08 1030) Resp:  [12-21] 16 (03/08 1030) BP: (103-155)/(65-107) 103/83 (03/08 1030) SpO2:  [95 %-100 %] 100 % (03/08 1030) Weight:  [59 kg (130 lb)] 59 kg (130 lb) (03/08 0658)    Intake/Output from previous day: No intake/output data recorded. Intake/Output this shift: Total I/O In: 2050 [IV Piggyback:2050] Out: -   PE: General: pleasant, WD, WN white female who is laying in bed in NAD HEENT: head is normocephalic, atraumatic.  Sclera are not injected and non-icteric. Ears and nose without any masses or lesions.  Mouth is pink and moist Heart: regular, rate, and rhythm. No obvious murmurs Lungs: CTAB, no wheezes, rhonchi, or  rales noted.  Respiratory effort nonlabored Abd: soft, mild RLQ discomfort with palpation, ND, no bowel sounds, no masses, hernias, or organomegaly. No clinical signs of peritonitis. Well healed RLQ scar.  MS: all 4 extremities are symmetrical with no cyanosis, clubbing, or edema. Skin: warm and dry with no masses, lesions, or rashes Psych: A&Ox3 with an appropriate affect.  Lab Results:   Recent Labs  07/29/16 0731  WBC 20.9*  HGB 14.0  HCT 42.1  PLT 162   BMET  Recent Labs  07/29/16 0731  NA 138  K 3.2*  CL 105  CO2 22  GLUCOSE 231*  BUN 12  CREATININE 0.86  CALCIUM 9.0   PT/INR No results for input(s): LABPROT, INR in the last 72 hours. CMP     Component Value Date/Time   NA 138 07/29/2016 0731   K 3.2 (L) 07/29/2016 0731   CL 105 07/29/2016 0731   CO2 22 07/29/2016 0731   GLUCOSE 231 (H) 07/29/2016 0731   BUN 12 07/29/2016 0731   CREATININE 0.86 07/29/2016 0731   CALCIUM 9.0 07/29/2016 0731   PROT 6.6 07/29/2016 0731   ALBUMIN 3.8 07/29/2016 0731   AST 22 07/29/2016 0731   ALT 12 (L) 07/29/2016 0731   ALKPHOS 41 07/29/2016 0731   BILITOT 0.6 07/29/2016 0731   GFRNONAA >60 07/29/2016 0731   GFRAA >60 07/29/2016 0731   Lipase     Component Value Date/Time   LIPASE 27 07/29/2016 0731       Studies/Results: Ct Abdomen Pelvis W Contrast  Result Date: 07/29/2016 CLINICAL DATA:  Abdominal pain, nausea, vomiting and diarrhea. EXAM:  CT ABDOMEN AND PELVIS WITH CONTRAST TECHNIQUE: Multidetector CT imaging of the abdomen and pelvis was performed using the standard protocol following bolus administration of intravenous contrast. CONTRAST:  100 ml ISOVUE-300 IOPAMIDOL (ISOVUE-300) INJECTION 61% COMPARISON:  None. FINDINGS: Lower chest: No pleural or pericardial effusion. Mild dependent atelectasis is noted. Heart size is normal. Hepatobiliary: The liver is diffusely low attenuating consistent with fatty infiltration. No focal lesion. The gallbladder and biliary  tree appear normal. Pancreas: Unremarkable. No pancreatic ductal dilatation or surrounding inflammatory changes. Spleen: Normal in size without focal abnormality. Adrenals/Urinary Tract: Adrenal glands are unremarkable. Kidneys are normal, without renal calculi, focal lesion, or hydronephrosis. Bladder is unremarkable. Stomach/Bowel: Multiple loops of small bowel in the right abdomen and pelvis demonstrate wall thickening and decreased enhancement. There is interloop fluid and a small volume of free pelvic fluid. No evidence of small bowel obstruction. The colon appears normal. Small hiatal hernia is seen. No pneumatosis, portal venous gas or free intraperitoneal air is identified. Vascular/Lymphatic: No significant vascular findings are present. No enlarged abdominal or pelvic lymph nodes. Reproductive: Uterus and bilateral adnexa are unremarkable. Other: None. Musculoskeletal: No fracture or worrisome lesion. IMPRESSION: Abnormal appearance of multiple loops of small bowel is described above could be due to severe enteritis but bowel ischemia is within the differential. Fatty infiltration of the liver. These results were called by telephone at the time of interpretation on 07/29/2016 at 9:47 am to Dr. Shirlyn Goltz , who verbally acknowledged these results. Electronically Signed   By: Inge Rise M.D.   On: 07/29/2016 09:50   Dg Abd Acute W/chest  Result Date: 07/29/2016 CLINICAL DATA:  Low to mid abdomen pain, nausea and vomit EXAM: DG ABDOMEN ACUTE W/ 1V CHEST COMPARISON:  Chest x-ray of 03/15/2012 FINDINGS: No active infiltrate or effusion is seen. Mediastinal and hilar contours are unremarkable. The heart is borderline enlarged any tool lead permanent pacemaker remains. Supine and left the decubitus films of the abdomen were obtained. No bowel obstruction is seen, with a relative paucity of bowel gas. No free air is seen on the decubitus image. No opaque calculi are seen. A rounded calcification is present  in the left bony pelvis possibly a phlebolith, with bladder calculus unlikely. IMPRESSION: 1. No active lung disease. Stable borderline cardiomegaly with permanent pacemaker. 2. No bowel obstruction or free air. 3. Calcification in the left bony pelvis could be a phlebolith, with bladder calculus less likely. Electronically Signed   By: Ivar Drape M.D.   On: 07/29/2016 08:35    Anti-infectives: Anti-infectives    Start     Dose/Rate Route Frequency Ordered Stop   07/29/16 0830  piperacillin-tazobactam (ZOSYN) IVPB 3.375 g     3.375 g 100 mL/hr over 30 Minutes Intravenous  Once 07/29/16 0830 07/29/16 1009       Assessment/Plan 76 y/o F with abdominal pain. for Case reviewed with Dr. Georgette Dover. Exam and objective data largely suggestive of enteritis. Recommend bowel rest/NPO, IV hydration continued treatment with IV Zosyn. Patient currently without urgent or emergent surgical indication. Case also discussed with Dr. Darl Householder (ED MD) and he will contact IM service for admission. We appreciate the opportunity to see this patient on consultation and will continue to do during her hospitalization.    LOS: 0 days    LEE PRESSON, [facility] 07/29/2016, 10:48 AM

## 2016-07-29 NOTE — H&P (Signed)
History and Physical    Lindsey Bryant SAY:[id number] DOB: 05/20/1941 DOA: 07/29/2016  PCP: [name], MD Patient coming from: home  Chief Complaint: abdominal pain/nausea/vomiting  HPI: Lindsey Bryant is a 76 y.o. female with medical history significant for parkinsons, irritable bowel syndrome, complete heart block s/p pacer history the emergency department with the chief complaint of abdominal pain nausea and vomiting. Initial evaluation includes CT of the abdomen concerning for enteritis vs ischemic bowel.   Information is obtained from the patient. She states she was in her usual state of health until 4 AM this morning she developed acute domino pain. She describes the pain as sharp located across entire lower abdomen. Associated symptoms include diarrhea nausea with one episode of emesis. She denies any coffee ground emesis. She denies any abdominal distention. She denies any melena or bright red blood per rectum. She states that ever since she's had Parkinson she fell like she's had a "lazy:". She feels as her Parkinson's progresses so does her challenges with constipation. She denies headache dizziness syncope or near-syncope. She denies any chest pain palpitation shortness of breath dysuria hematuria frequency or urgency. She denies any cough from a edema orthopnea.   ED Course: In the emergency department she's hemodynamically stable and not hypoxic. Provided with 3 L normal saline and Zosyn as well as zofran  Review of Systems: As per HPI otherwise 10 point review of systems negative.   Ambulatory Status: reports steady gait no recent falls  Past Medical History:  Diagnosis Date  . Bradycardia    a. 02/2012  . Hyperglycemia   . IBS (irritable bowel syndrome)   . Melanoma ([place])    right arm  . 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 Parkinson's disease     Past Surgical History:  Procedure Laterality Date  . APPENDECTOMY  1952  . MELANOMA EXCISION Right 2000   right arm    . PACEMAKER PLACEMENT  05/2012  . PACEMAKER REVISION N/A 03/14/2012   Procedure: PACEMAKER REVISION;  Surgeon: Thompson Grayer, MD;  Location: [facility] CATH LAB;  Service: Cardiovascular;  Laterality: N/A;  . PERMANENT PACEMAKER INSERTION N/A 03/06/2012   Procedure: PERMANENT PACEMAKER INSERTION;  Surgeon: Deboraha Sprang, MD;  Location: [facility] CATH LAB;  Service: Cardiovascular;  Laterality: N/A;  . 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    Social History   Social History  . Marital status: Married    Spouse name: N/A  . Number of children: 2  . Years of education: N/A   Occupational History  . Not on file.   Social History Main Topics  . Smoking status: Former Research scientist (life sciences)  . Smokeless tobacco: Never Used     Comment: 10 yrs in college  . Alcohol use No  . Drug use: No  . Sexual activity: Not on file   Other Topics Concern  . Not on file   Social History Narrative  . No narrative on file  Is at home with her husband  No Known Allergies  Family History  Problem Relation Age of Onset  . Alcohol abuse Father   . Hypothyroidism Daughter   . Heart attack Mother 31    Details unclear  . Diabetes Brother   . Breast cancer    . Hypothyroidism Daughter   . Colon cancer Neg Hx     Prior to Admission medications   Medication Sig Start Date End Date Taking? Authorizing Provider  aspirin EC 81 MG tablet Take 81  mg by mouth at bedtime.    Historical Provider, MD  carbidopa-levodopa (SINEMET IR) 25-100 MG per tablet Take by mouth 4 (four) times daily. 1.5 tab tid...    Historical Provider, MD  clidinium-chlordiazePOXIDE (LIBRAX) 5-2.5 MG per capsule Take 1 capsule by mouth 3 (three) times daily as needed. 01/18/14   [name], MD  docusate sodium (COLACE) 100 MG capsule Take 100 mg by mouth daily.    Historical Provider, MD  estrogens, conjugated, (PREMARIN) 0.3 MG tablet Take 1 tablet (0.3 mg total) by mouth at bedtime. 04/29/16   [name], MD   medroxyPROGESTERone (PROVERA) 2.5 MG tablet TAKE 1 TABLET (2.5 MG TOTAL) BY MOUTH DAILY. 04/19/16   [name], MD  mirabegron ER (MYRBETRIQ) 25 MG TB24 tablet Take 1 tablet (25 mg total) by mouth daily. 02/25/16   [name], MD  Omega-3 1000 MG CAPS Take 1 capsule by mouth daily.    Historical Provider, MD  polyethylene glycol (MIRALAX / GLYCOLAX) packet Take 17 g by mouth daily as needed.    Historical Provider, MD    Physical Exam: Vitals:   07/29/16 1030 07/29/16 1100 07/29/16 1130 07/29/16 1205  BP: 103/83 116/73 130/90   Pulse: 64 88 93   Resp: 16 19 14    Temp:    97.4 F (36.3 C)  TempSrc:    Oral  SpO2: 100% 99% 99%   Weight:      Height:         General:  Appears calm and comfortable Eyes:  PERRL, EOMI, normal lids, iris ENT:  grossly normal hearing, lips & tongue, His membranes of her mouth are pink and dry Neck:  no LAD, masses or thyromegaly Cardiovascular:  RRR, no m/r/g. No LE edema.  Respiratory:  CTA bilaterally, no w/r/r. Normal respiratory effort. Abdomen:  soft, nondistended sluggish bowel sounds diffuse tenderness particularly in the lower quadrants guarding or rebounding Skin:  no rash or induration seen on limited exam Musculoskeletal:  grossly normal tone BUE/BLE, good ROM, no bony abnormality Psychiatric:  grossly normal mood and affect, speech fluent and appropriate, AOx3 Neurologic:  CN 2-12 grossly intact, moves all extremities in coordinated fashion, sensation intact  Labs on Admission: I have personally reviewed following labs and imaging studies  CBC:  Recent Labs Lab 07/29/16 0731  WBC 20.9*  NEUTROABS 16.7*  HGB 14.0  HCT 42.1  MCV 99.5  PLT 741   Basic Metabolic Panel:  Recent Labs Lab 07/29/16 0731  NA 138  K 3.2*  CL 105  CO2 22  GLUCOSE 231*  BUN 12  CREATININE 0.86  CALCIUM 9.0   GFR: Estimated Creatinine Clearance: 45.4 mL/min (by C-G formula based on SCr of 0.86 mg/dL). Liver Function  Tests:  Recent Labs Lab 07/29/16 0731  AST 22  ALT 12*  ALKPHOS 41  BILITOT 0.6  PROT 6.6  ALBUMIN 3.8    Recent Labs Lab 07/29/16 0731  LIPASE 27   No results for input(s): AMMONIA in the last 168 hours. Coagulation Profile: No results for input(s): INR, PROTIME in the last 168 hours. Cardiac Enzymes: No results for input(s): CKTOTAL, CKMB, CKMBINDEX, TROPONINI in the last 168 hours. BNP (last 3 results) No results for input(s): PROBNP in the last 8760 hours. HbA1C: No results for input(s): HGBA1C in the last 72 hours. CBG: No results for input(s): GLUCAP in the last 168 hours. Lipid Profile: No results for input(s): CHOL, HDL, LDLCALC, TRIG, CHOLHDL, LDLDIRECT in the last 72  hours. Thyroid Function Tests:  Recent Labs  07/29/16 0834  TSH 6.519*   Anemia Panel: No results for input(s): VITAMINB12, FOLATE, FERRITIN, TIBC, IRON, RETICCTPCT in the last 72 hours. Urine analysis:    Component Value Date/Time   COLORURINE yellow 02/26/2008 1001   APPEARANCEUR Clear 02/26/2008 1001   LABSPEC 1.020 02/26/2008 1001   PHURINE 5.5 02/26/2008 1001   HGBUR negative 02/26/2008 1001   BILIRUBINUR n 06/05/2013 1114   PROTEINUR n 06/05/2013 1114   UROBILINOGEN 0.2 06/05/2013 1114   UROBILINOGEN 0.2 02/26/2008 1001   NITRITE n 06/05/2013 1114   NITRITE negative 02/26/2008 1001   LEUKOCYTESUR Trace 06/05/2013 1114    Creatinine Clearance: Estimated Creatinine Clearance: 45.4 mL/min (by C-G formula based on SCr of 0.86 mg/dL).  Sepsis Labs: @LABRCNTIP (procalcitonin:4,lacticidven:4) )No results found for this or any previous visit (from the past 240 hour(s)).   Radiological Exams on Admission: Ct Abdomen Pelvis W Contrast  Result Date: 07/29/2016 CLINICAL DATA:  Abdominal pain, nausea, vomiting and diarrhea. EXAM: CT ABDOMEN AND PELVIS WITH CONTRAST TECHNIQUE: Multidetector CT imaging of the abdomen and pelvis was performed using the standard protocol following bolus  administration of intravenous contrast. CONTRAST:  100 ml ISOVUE-300 IOPAMIDOL (ISOVUE-300) INJECTION 61% COMPARISON:  None. FINDINGS: Lower chest: No pleural or pericardial effusion. Mild dependent atelectasis is noted. Heart size is normal. Hepatobiliary: The liver is diffusely low attenuating consistent with fatty infiltration. No focal lesion. The gallbladder and biliary tree appear normal. Pancreas: Unremarkable. No pancreatic ductal dilatation or surrounding inflammatory changes. Spleen: Normal in size without focal abnormality. Adrenals/Urinary Tract: Adrenal glands are unremarkable. Kidneys are normal, without renal calculi, focal lesion, or hydronephrosis. Bladder is unremarkable. Stomach/Bowel: Multiple loops of small bowel in the right abdomen and pelvis demonstrate wall thickening and decreased enhancement. There is interloop fluid and a small volume of free pelvic fluid. No evidence of small bowel obstruction. The colon appears normal. Small hiatal hernia is seen. No pneumatosis, portal venous gas or free intraperitoneal air is identified. Vascular/Lymphatic: No significant vascular findings are present. No enlarged abdominal or pelvic lymph nodes. Reproductive: Uterus and bilateral adnexa are unremarkable. Other: None. Musculoskeletal: No fracture or worrisome lesion. IMPRESSION: Abnormal appearance of multiple loops of small bowel is described above could be due to severe enteritis but bowel ischemia is within the differential. Fatty infiltration of the liver. These results were called by telephone at the time of interpretation on 07/29/2016 at 9:47 am to Dr. Shirlyn Goltz , who verbally acknowledged these results. Electronically Signed   By: Inge Rise M.D.   On: 07/29/2016 09:50   Dg Abd Acute W/chest  Result Date: 07/29/2016 CLINICAL DATA:  Low to mid abdomen pain, nausea and vomit EXAM: DG ABDOMEN ACUTE W/ 1V CHEST COMPARISON:  Chest x-ray of 03/15/2012 FINDINGS: No active infiltrate or  effusion is seen. Mediastinal and hilar contours are unremarkable. The heart is borderline enlarged any tool lead permanent pacemaker remains. Supine and left the decubitus films of the abdomen were obtained. No bowel obstruction is seen, with a relative paucity of bowel gas. No free air is seen on the decubitus image. No opaque calculi are seen. A rounded calcification is present in the left bony pelvis possibly a phlebolith, with bladder calculus unlikely. IMPRESSION: 1. No active lung disease. Stable borderline cardiomegaly with permanent pacemaker. 2. No bowel obstruction or free air. 3. Calcification in the left bony pelvis could be a phlebolith, with bladder calculus less likely. Electronically Signed   By: Ivar Drape  M.D.   On: 07/29/2016 08:35    EKG: Independently reviewed. Sinus rhythm Left bundle branch block Baseline wander in lead(s) II III aVL aVFprevious tracing paced  Assessment/Plan Principal Problem:   Abdominal pain Active Problems:   PARKINSON'S   Pacemaker-Medtronic   Elevated lactic acid level   Elevated troponin   Hypokalemia   Enteritis   Hyperglycemia   Elevated TSH    #1. Abdominal pain/enteritis versus ischemic bowel. CT as noted above. Lactic acid 4.31 leukocytosis of 20. He is afebrile hemodynamically stable and not hypoxic. She is nontoxic appearing. He is provided with 3 L of normal saline and Zosyn in the emergency department. General surgery evaluated CT recommended medical and nothing by mouth agreed with Zosyn. -admit -npo -IV fluids -VS q 4 hrs -continue zosyn -trend lactic acid  #2. Elevated troponin. Chart review indicates EMS KG reading of V. tach. Resolved during transport. She denies any chest pain palpitations. Presumably false reading related to pacemaker. Interrogation in the emergency department revealed abnormalities of some slowing. -cycle troponin -serial ekg -await cardiology input -hold asa for now   #3. Hypokalemia. Mild. Likely  related to vomiting and diarrhea. Potassium 3.2 -replete -recheck  #4. Parkinson's. Appears stable at baseline -continue home meds when able  #5. Hyperglycemia. Denies hx DM. Serum glucose 231.  -obtain A1c -SSI for optimal control   #6. Elevated TSH. TSH 6.5.  -obtain free t4 -obtain free t3.  -OP follow up   DVT prophylaxis: heparin  Code Status: full  Family Communication: husband at bedside  Disposition Plan: home  Consults called: tsue general surgery  Admission status: inpatient    Radene Gunning MD [facility]  If 7PM-7AM, please contact night-coverage [email or web address] Password [facility]  07/29/2016, 12:06 PM

## 2016-07-30 ENCOUNTER — Inpatient Hospital Stay (HOSPITAL_COMMUNITY): Payer: Medicare Other

## 2016-07-30 DIAGNOSIS — K529 Noninfective gastroenteritis and colitis, unspecified: Principal | ICD-10-CM

## 2016-07-30 DIAGNOSIS — R7989 Other specified abnormal findings of blood chemistry: Secondary | ICD-10-CM

## 2016-07-30 DIAGNOSIS — R739 Hyperglycemia, unspecified: Secondary | ICD-10-CM

## 2016-07-30 LAB — BASIC METABOLIC PANEL
ANION GAP: 6 (ref 5–15)
BUN: 10 mg/dL (ref 6–20)
CALCIUM: 7.6 mg/dL — AB (ref 8.9–10.3)
CHLORIDE: 114 mmol/L — AB (ref 101–111)
CO2: 18 mmol/L — AB (ref 22–32)
Creatinine, Ser: 0.74 mg/dL (ref 0.44–1.00)
GFR calc Af Amer: >60 mL/min (ref >60)
GFR calc non Af Amer: >60 mL/min (ref >60)
GLUCOSE: 122 mg/dL — AB (ref 65–99)
Potassium: 4.4 mmol/L (ref 3.5–5.1)
Sodium: 138 mmol/L (ref 135–145)

## 2016-07-30 LAB — CBC
HEMATOCRIT: 39.4 % (ref 36.0–46.0)
HEMOGLOBIN: 12.8 g/dL (ref 12.0–15.0)
MCH: 32.2 pg (ref 26.0–34.0)
MCHC: 32.5 g/dL (ref 30.0–36.0)
MCV: 99 fL (ref 78.0–100.0)
Platelets: 121 10*3/uL — ABNORMAL LOW (ref 150–400)
RBC: 3.98 MIL/uL (ref 3.87–5.11)
RDW: 12.6 % (ref 11.5–15.5)
WBC: 17.7 10*3/uL — ABNORMAL HIGH (ref 4.0–10.5)

## 2016-07-30 LAB — LACTIC ACID, PLASMA: LACTIC ACID, VENOUS: 2 mmol/L — AB (ref 0.5–1.9)

## 2016-07-30 LAB — ECHOCARDIOGRAM COMPLETE
Height: 62 in
Weight: 2282.2 oz

## 2016-07-30 LAB — GLUCOSE, CAPILLARY
GLUCOSE-CAPILLARY: 104 mg/dL — AB (ref 65–99)
Glucose-Capillary: 104 mg/dL — ABNORMAL HIGH (ref 65–99)
Glucose-Capillary: 125 mg/dL — ABNORMAL HIGH (ref 65–99)
Glucose-Capillary: 130 mg/dL — ABNORMAL HIGH (ref 65–99)
Glucose-Capillary: 132 mg/dL — ABNORMAL HIGH (ref 65–99)
Glucose-Capillary: 143 mg/dL — ABNORMAL HIGH (ref 65–99)

## 2016-07-30 LAB — HEMOGLOBIN A1C
Hgb A1c MFr Bld: 5.3 % (ref 4.8–5.6)
Mean Plasma Glucose: 105 mg/dL

## 2016-07-30 MED ORDER — CALCIUM CARBONATE ANTACID 500 MG PO CHEW
1.0000 | CHEWABLE_TABLET | Freq: Two times a day (BID) | ORAL | Status: DC
  Administered 2016-07-30 – 2016-08-04 (×9): 200 mg via ORAL
  Filled 2016-07-30 (×10): qty 1

## 2016-07-30 MED ORDER — DEXTROSE-NACL 5-0.45 % IV SOLN
INTRAVENOUS | Status: DC
  Administered 2016-07-30 (×2): via INTRAVENOUS

## 2016-07-30 NOTE — Progress Notes (Signed)
Patient transferred to [place] with NT by bed.

## 2016-07-30 NOTE — Progress Notes (Signed)
Pt had 2 loose bloody stool in moderate amount since 7pm tonight, GI panel sent per order. Walden Field NP made aware. Will continue to monitor.

## 2016-07-30 NOTE — Progress Notes (Signed)
Subjective: Patient is comfortable.  Reports no nausea or vomiting One bowel movement yesterday evening - non-bloody Patient does report seeing some blood a couple of days ago.  Objective: Vital signs in last 24 hours: Temp:  [97.4 F (36.3 C)-97.9 F (36.6 C)] 97.8 F (36.6 C) (03/09 0405) Pulse Rate:  [64-102] 78 (03/09 0405) Resp:  [9-23] 16 (03/09 0405) BP: (102-151)/(60-107) 109/60 (03/09 0405) SpO2:  [95 %-100 %] 97 % (03/09 0405) Weight:  [61.9 kg (136 lb 7.4 oz)-64.7 kg (142 lb 10.2 oz)] 64.7 kg (142 lb 10.2 oz) (03/09 0500) Last BM Date: 07/29/16  Intake/Output from previous day: 03/08 0701 - 03/09 0700 In: 6436.5 [P.O.:30; I.V.:1257.5; IV VELFYBOFB:5102] Out: 500 [Urine:500] Intake/Output this shift: No intake/output data recorded.  General appearance: alert, cooperative and no distress GI: soft, non-tender; bowel sounds normal; no masses,  no organomegaly  Lab Results:   Recent Labs  07/29/16 0731 07/30/16 0334  WBC 20.9* 17.7*  HGB 14.0 12.8  HCT 42.1 39.4  PLT 162 121*   BMET  Recent Labs  07/29/16 0731 07/30/16 0334  NA 138 138  K 3.2* 4.4  CL 105 114*  CO2 22 18*  GLUCOSE 231* 122*  BUN 12 10  CREATININE 0.86 0.74  CALCIUM 9.0 7.6*   PT/INR  Recent Labs  07/29/16 1400  LABPROT 13.8  INR 1.05   ABG No results for input(s): PHART, HCO3 in the last 72 hours.  Invalid input(s): PCO2, PO2  Studies/Results: Ct Abdomen Pelvis W Contrast  Result Date: 07/29/2016 CLINICAL DATA:  Abdominal pain, nausea, vomiting and diarrhea. EXAM: CT ABDOMEN AND PELVIS WITH CONTRAST TECHNIQUE: Multidetector CT imaging of the abdomen and pelvis was performed using the standard protocol following bolus administration of intravenous contrast. CONTRAST:  100 ml ISOVUE-300 IOPAMIDOL (ISOVUE-300) INJECTION 61% COMPARISON:  None. FINDINGS: Lower chest: No pleural or pericardial effusion. Mild dependent atelectasis is noted. Heart size is normal. Hepatobiliary:  The liver is diffusely low attenuating consistent with fatty infiltration. No focal lesion. The gallbladder and biliary tree appear normal. Pancreas: Unremarkable. No pancreatic ductal dilatation or surrounding inflammatory changes. Spleen: Normal in size without focal abnormality. Adrenals/Urinary Tract: Adrenal glands are unremarkable. Kidneys are normal, without renal calculi, focal lesion, or hydronephrosis. Bladder is unremarkable. Stomach/Bowel: Multiple loops of small bowel in the right abdomen and pelvis demonstrate wall thickening and decreased enhancement. There is interloop fluid and a small volume of free pelvic fluid. No evidence of small bowel obstruction. The colon appears normal. Small hiatal hernia is seen. No pneumatosis, portal venous gas or free intraperitoneal air is identified. Vascular/Lymphatic: No significant vascular findings are present. No enlarged abdominal or pelvic lymph nodes. Reproductive: Uterus and bilateral adnexa are unremarkable. Other: None. Musculoskeletal: No fracture or worrisome lesion. IMPRESSION: Abnormal appearance of multiple loops of small bowel is described above could be due to severe enteritis but bowel ischemia is within the differential. Fatty infiltration of the liver. These results were called by telephone at the time of interpretation on 07/29/2016 at 9:47 am to Dr. Shirlyn Goltz , who verbally acknowledged these results. Electronically Signed   By: Inge Rise M.D.   On: 07/29/2016 09:50   Dg Abd Acute W/chest  Result Date: 07/29/2016 CLINICAL DATA:  Low to mid abdomen pain, nausea and vomit EXAM: DG ABDOMEN ACUTE W/ 1V CHEST COMPARISON:  Chest x-ray of 03/15/2012 FINDINGS: No active infiltrate or effusion is seen. Mediastinal and hilar contours are unremarkable. The heart is borderline enlarged any tool lead permanent  pacemaker remains. Supine and left the decubitus films of the abdomen were obtained. No bowel obstruction is seen, with a relative paucity of  bowel gas. No free air is seen on the decubitus image. No opaque calculi are seen. A rounded calcification is present in the left bony pelvis possibly a phlebolith, with bladder calculus unlikely. IMPRESSION: 1. No active lung disease. Stable borderline cardiomegaly with permanent pacemaker. 2. No bowel obstruction or free air. 3. Calcification in the left bony pelvis could be a phlebolith, with bladder calculus less likely. Electronically Signed   By: Ivar Drape M.D.   On: 07/29/2016 08:35    Anti-infectives: Anti-infectives    Start     Dose/Rate Route Frequency Ordered Stop   07/29/16 1700  piperacillin-tazobactam (ZOSYN) IVPB 3.375 g     3.375 g 12.5 mL/hr over 240 Minutes Intravenous Every 8 hours 07/29/16 1133     07/29/16 0830  piperacillin-tazobactam (ZOSYN) IVPB 3.375 g     3.375 g 100 mL/hr over 30 Minutes Intravenous  Once 07/29/16 0830 07/29/16 1009      Assessment/Plan: Gastroenteritis - no sign of mesenteric ischemia Initiate clear liquids - advance as tolerated No acute surgical issues If any further hematochezia or melena noted, would consult GI. We will sign off for now.  Discharge per primary team once other medical issues resolved.    LOS: 1 day    Lindsey Longo K. 07/30/2016

## 2016-07-30 NOTE — Progress Notes (Signed)
Admission note:  Arrival Method: trsnsfer from 4e via bed, accompanied by NT. Mental Orientation: A&Ox4 Telemetry: box 16, V-paced, verified, CCMD notified. Assessment: see doc flow sheet Skin: warm, dry and intact. IV: R hand D5 1/2 NS at 61ml/hr, L AC SL. Pain: denies Tubes: none Safety Measures and fall prevention safety plan discussed and reviewed with pt, verbalized understanding, call bell and telephone within reach. 6700 Orientation: Patient has been oriented to the unit, staff and to the room.

## 2016-07-30 NOTE — Care Management Note (Signed)
Case Management Note  Patient Details  Name: Lindsey Bryant MRN: [id number] Date of Birth: 28-Jul-1940  Subjective/Objective:  Pt lives with husband, has h/o Parkinson's, IBS, CHB s/p pacemaker, admitted with abdominal pain and vomiting. CT abdomen showed possible enteritis.  On clear liquid diet, cards consulted 2/2 elevated troponin.                               Expected Discharge Plan:  [place]  Discharge planning Services  CM Consult  Status of Service:  In process, will continue to follow  Girard Cooter, RN 07/30/2016, 2:04 PM

## 2016-07-30 NOTE — Progress Notes (Addendum)
PROGRESS NOTE    Lindsey Bryant  NLG:[id number] DOB: [birth year] DOA: 07/29/2016 PCP: [name], MD   Brief Narrative:  76 year old female with history of Parkinson's, IBS, complete heart block status post pacemaker came to the ED with complaints of abdominal pain and vomiting. CT abdomen pelvis was done which showed concerning for enteritis versus ischemic bowel. She was evaluated by general surgery who did not think patient was having ischemic bowel. She was left nothing by mouth overnight and started on Zosyn.  Assessment & Plan:   Principal Problem:   Abdominal pain Active Problems:   PARKINSON'S   Pacemaker-[employer]   Elevated lactic acid level   Elevated troponin   Hypokalemia   Enteritis   Hyperglycemia   Elevated TSH   Lactic acidosis   NSTEMI (non-ST elevated myocardial infarction) (HCC)  Generalized abdominal pain-likely enteritis -Clear liquid diet at this time, advance to full liquid as tolerated. Will adv her diet slowly due to severe inflammation. -Antiemetics as needed. IV fluids if necessary -GI panel sent which is pending at this time. Continue contact isolation at this time -Continue Zosyn. -Likely trending down -Abdominal exam is relatively benign at this time. General surgery is following, no surgical indication at this time. -She would benefit from probiotic.  Elevated troponin -Likely demand ischemia -Echocardiogram ordered -Cardiology following  -may need pacemaker interrogated. Will defer to cardiology excimer  Hypokalemia-resolved  Parkinson's-stable -Continue home medications. Sinemet IR  Hyperglycemia-resolved -No history of diabetes, follow-up A1c -On sliding scale at this time. Her fluids for now have been changed to D5 half-normal saline until her by mouth intake becomes consistent  DVT prophylaxis:  SCDs Code Status:  Full code Family Communication:  Daughter and husband present at bedside Disposition Plan:  Transfer to [place]  floor as she is hemodynamically stable.  Consultants:   General surgery  Cardiology  Procedures:   None  Antimicrobials:   Zosyn started on 3/8   Subjective:  Patient states her abdominal pain is significantly better this morning. She had one episode of nausea last night but none since then. She is tolerating clear liquid diet. No chest pain, shortness of breath, dizziness, fevers, chills and other complaints. Her last colonoscopy was about 2 years ago and states it was normal.  Objective: Vitals:   07/30/16 0405 07/30/16 0500 07/30/16 0805 07/30/16 1205  BP: 109/60  112/63 105/61  Pulse: 78  70 69  Resp: 16  16 19   Temp: 97.8 F (36.6 C)  98.6 F (37 C) 98.2 F (36.8 C)  TempSrc: Axillary  Oral Oral  SpO2: 97%  99% 100%  Weight:  64.7 kg (142 lb 10.2 oz)    Height:        Intake/Output Summary (Last 24 hours) at 07/30/16 1237 Last data filed at 07/30/16 1100  Gross per 24 hour  Intake           4666.5 ml  Output              800 ml  Net           3866.5 ml   Filed Weights   07/29/16 0658 07/29/16 1403 07/30/16 0500  Weight: 59 kg (130 lb) 61.9 kg (136 lb 7.4 oz) 64.7 kg (142 lb 10.2 oz)    Examination:  General exam: Appears calm and comfortable  Respiratory system: Clear to auscultation. Respiratory effort normal. Cardiovascular system: S1 & S2 heard, RRR. No JVD, murmurs, rubs, gallops or clicks. No pedal edema. Gastrointestinal system: Abdomen  is nondistended, soft and nontender. No organomegaly or masses felt. Normal bowel sounds heard. Central nervous system: Alert and oriented. No focal neurological deficits. Extremities: Symmetric 5 x 5 power. Skin: No rashes, lesions or ulcers Psychiatry: Judgement and insight appear normal. Mood & affect appropriate.     Data Reviewed:   CBC:  Recent Labs Lab 07/29/16 0731 07/30/16 0334  WBC 20.9* 17.7*  NEUTROABS 16.7*  --   HGB 14.0 12.8  HCT 42.1 39.4  MCV 99.5 99.0  PLT 162 121*   Basic  Metabolic Panel:  Recent Labs Lab 07/29/16 0731 07/30/16 0334  NA 138 138  K 3.2* 4.4  CL 105 114*  CO2 22 18*  GLUCOSE 231* 122*  BUN 12 10  CREATININE 0.86 0.74  CALCIUM 9.0 7.6*   GFR: Estimated Creatinine Clearance: 54.4 mL/min (by C-G formula based on SCr of 0.74 mg/dL). Liver Function Tests:  Recent Labs Lab 07/29/16 0731  AST 22  ALT 12*  ALKPHOS 41  BILITOT 0.6  PROT 6.6  ALBUMIN 3.8    Recent Labs Lab 07/29/16 0731  LIPASE 27   No results for input(s): AMMONIA in the last 168 hours. Coagulation Profile:  Recent Labs Lab 07/29/16 1400  INR 1.05   Cardiac Enzymes:  Recent Labs Lab 07/29/16 1400 07/29/16 1920  TROPONINI 1.95* 1.58*   BNP (last 3 results) No results for input(s): PROBNP in the last 8760 hours. HbA1C:  Recent Labs  07/29/16 1242  HGBA1C 5.3   CBG:  Recent Labs Lab 07/29/16 1935 07/29/16 2300 07/30/16 0402 07/30/16 0803 07/30/16 1208  GLUCAP 152* 137* 104* 104* 130*   Lipid Profile: No results for input(s): CHOL, HDL, LDLCALC, TRIG, CHOLHDL, LDLDIRECT in the last 72 hours. Thyroid Function Tests:  Recent Labs  07/29/16 0834 07/29/16 1208  TSH 6.519*  --   FREET4  --  0.84   Anemia Panel: No results for input(s): VITAMINB12, FOLATE, FERRITIN, TIBC, IRON, RETICCTPCT in the last 72 hours. Sepsis Labs:  Recent Labs Lab 07/29/16 1212 07/29/16 1920 07/29/16 2215 07/30/16 0334  LATICACIDVEN 4.2* 4.2* 3.9* 2.0*    Recent Results (from the past 240 hour(s))  Blood culture (routine x 2)     Status: None (Preliminary result)   Collection Time: 07/29/16  7:32 AM  Result Value Ref Range Status   Specimen Description BLOOD LEFT ANTECUBITAL  Final   Special Requests BOTTLES DRAWN AEROBIC AND ANAEROBIC  5CC  Final   Culture NO GROWTH 1 DAY  Final   Report Status PENDING  Incomplete  Blood culture (routine x 2)     Status: None (Preliminary result)   Collection Time: 07/29/16  8:34 AM  Result Value Ref Range  Status   Specimen Description BLOOD RIGHT HAND  Final   Special Requests BOTTLES DRAWN AEROBIC ONLY  3CC  Final   Culture NO GROWTH 1 DAY  Final   Report Status PENDING  Incomplete  MRSA PCR Screening     Status: None   Collection Time: 07/29/16  2:28 PM  Result Value Ref Range Status   MRSA by PCR NEGATIVE NEGATIVE Final    Comment:        The GeneXpert MRSA Assay (FDA approved for NASAL specimens only), is one component of a comprehensive MRSA colonization surveillance program. It is not intended to diagnose MRSA infection nor to guide or monitor treatment for MRSA infections.          Radiology Studies: Ct Abdomen Pelvis W Contrast  Result  Date: 07/29/2016 CLINICAL DATA:  Abdominal pain, nausea, vomiting and diarrhea. EXAM: CT ABDOMEN AND PELVIS WITH CONTRAST TECHNIQUE: Multidetector CT imaging of the abdomen and pelvis was performed using the standard protocol following bolus administration of intravenous contrast. CONTRAST:  100 ml ISOVUE-300 IOPAMIDOL (ISOVUE-300) INJECTION 61% COMPARISON:  None. FINDINGS: Lower chest: No pleural or pericardial effusion. Mild dependent atelectasis is noted. Heart size is normal. Hepatobiliary: The liver is diffusely low attenuating consistent with fatty infiltration. No focal lesion. The gallbladder and biliary tree appear normal. Pancreas: Unremarkable. No pancreatic ductal dilatation or surrounding inflammatory changes. Spleen: Normal in size without focal abnormality. Adrenals/Urinary Tract: Adrenal glands are unremarkable. Kidneys are normal, without renal calculi, focal lesion, or hydronephrosis. Bladder is unremarkable. Stomach/Bowel: Multiple loops of small bowel in the right abdomen and pelvis demonstrate wall thickening and decreased enhancement. There is interloop fluid and a small volume of free pelvic fluid. No evidence of small bowel obstruction. The colon appears normal. Small hiatal hernia is seen. No pneumatosis, portal venous gas or  free intraperitoneal air is identified. Vascular/Lymphatic: No significant vascular findings are present. No enlarged abdominal or pelvic lymph nodes. Reproductive: Uterus and bilateral adnexa are unremarkable. Other: None. Musculoskeletal: No fracture or worrisome lesion. IMPRESSION: Abnormal appearance of multiple loops of small bowel is described above could be due to severe enteritis but bowel ischemia is within the differential. Fatty infiltration of the liver. These results were called by telephone at the time of interpretation on 07/29/2016 at 9:47 am to Dr. Shirlyn Goltz , who verbally acknowledged these results. Electronically Signed   By: Inge Rise M.D.   On: 07/29/2016 09:50   Dg Abd Acute W/chest  Result Date: 07/29/2016 CLINICAL DATA:  Low to mid abdomen pain, nausea and vomit EXAM: DG ABDOMEN ACUTE W/ 1V CHEST COMPARISON:  Chest x-ray of 03/15/2012 FINDINGS: No active infiltrate or effusion is seen. Mediastinal and hilar contours are unremarkable. The heart is borderline enlarged any tool lead permanent pacemaker remains. Supine and left the decubitus films of the abdomen were obtained. No bowel obstruction is seen, with a relative paucity of bowel gas. No free air is seen on the decubitus image. No opaque calculi are seen. A rounded calcification is present in the left bony pelvis possibly a phlebolith, with bladder calculus unlikely. IMPRESSION: 1. No active lung disease. Stable borderline cardiomegaly with permanent pacemaker. 2. No bowel obstruction or free air. 3. Calcification in the left bony pelvis could be a phlebolith, with bladder calculus less likely. Electronically Signed   By: Ivar Drape M.D.   On: 07/29/2016 08:35        Scheduled Meds: . calcium carbonate  1 tablet Oral BID WC  . carbidopa-levodopa  2 tablet Oral QID  . insulin aspart  0-9 Units Subcutaneous Q4H  . piperacillin-tazobactam (ZOSYN)  IV  3.375 g Intravenous Q8H  . potassium chloride  40 mEq Oral Once  .  sodium chloride flush  3 mL Intravenous Q12H   Continuous Infusions: . dextrose 5 % and 0.45% NaCl 75 mL/hr at 07/30/16 1100     LOS: 1 day    Time spent: 35 mins     Ankit Arsenio Loader, MD [facility] Pager [phone]   If 7PM-7AM, please contact night-coverage [email or web address] Password [facility] 07/30/2016, 12:37 PM

## 2016-07-30 NOTE — Progress Notes (Signed)
Progress Note  Patient Name: Lindsey Bryant Date of Encounter: 07/30/2016  Primary Cardiologist: Dr Caryl Comes  Subjective   No significant abdominal pain this am. No chest pain.  Inpatient Medications    Scheduled Meds: . carbidopa-levodopa  2 tablet Oral QID  . insulin aspart  0-9 Units Subcutaneous Q4H  . piperacillin-tazobactam (ZOSYN)  IV  3.375 g Intravenous Q8H  . potassium chloride  40 mEq Oral Once  . sodium chloride flush  3 mL Intravenous Q12H   Continuous Infusions: . dextrose 5 % and 0.45% NaCl 75 mL/hr at 07/30/16 0811   PRN Meds: acetaminophen **OR** acetaminophen, morphine injection, ondansetron **OR** ondansetron (ZOFRAN) IV   Vital Signs    Vitals:   07/29/16 2258 07/30/16 0405 07/30/16 0500 07/30/16 0805  BP: 105/66 109/60  112/63  Pulse: 66 78  70  Resp: 17 16  16   Temp: 97.9 F (36.6 C) 97.8 F (36.6 C)  98.6 F (37 C)  TempSrc: Oral Axillary  Oral  SpO2: 98% 97%  99%  Weight:   142 lb 10.2 oz (64.7 kg)   Height:        Intake/Output Summary (Last 24 hours) at 07/30/16 0938 Last data filed at 07/30/16 0811  Gross per 24 hour  Intake          7265.25 ml  Output              800 ml  Net          6465.25 ml   Filed Weights   07/29/16 0658 07/29/16 1403 07/30/16 0500  Weight: 130 lb (59 kg) 136 lb 7.4 oz (61.9 kg) 142 lb 10.2 oz (64.7 kg)    Telemetry    NSR, pacing on demand-there are strips from 7am today that look like AF- Personally Reviewed  ECG    NSR- V paced - Personally Reviewed  Physical Exam    GEN: No acute distress.   Neck: No JVD, no bruit Cardiac: RRR, no murmurs, rubs, or gallops.  Respiratory: Clear to auscultation bilaterally. GI: Soft, nontender, non-distended  MS: No edema; No deformity. Neuro:  slight resting tremor Psych: Normal affect   Labs    Chemistry Recent Labs Lab 07/29/16 0731 07/30/16 0334  NA 138 138  K 3.2* 4.4  CL 105 114*  CO2 22 18*  GLUCOSE 231* 122*  BUN 12 10  CREATININE 0.86  0.74  CALCIUM 9.0 7.6*  PROT 6.6  --   ALBUMIN 3.8  --   AST 22  --   ALT 12*  --   ALKPHOS 41  --   BILITOT 0.6  --   GFRNONAA >60 >60  GFRAA >60 >60  ANIONGAP 11 6     Hematology Recent Labs Lab 07/29/16 0731 07/30/16 0334  WBC 20.9* 17.7*  RBC 4.23 3.98  HGB 14.0 12.8  HCT 42.1 39.4  MCV 99.5 99.0  MCH 33.1 32.2  MCHC 33.3 32.5  RDW 12.5 12.6  PLT 162 121*    Cardiac Enzymes Recent Labs Lab 07/29/16 1400 07/29/16 1920  TROPONINI 1.95* 1.58*    Recent Labs Lab 07/29/16 0755  TROPIPOC 0.10*     Radiology    Ct Abdomen Pelvis W Contrast  Result Date: 07/29/2016 CLINICAL DATA:  Abdominal pain, nausea, vomiting and diarrhea. EXAM: CT ABDOMEN AND PELVIS WITH CONTRAST TECHNIQUE: Multidetector CT imaging of the abdomen and pelvis was performed using the standard protocol following bolus administration of intravenous contrast. CONTRAST:  100 ml ISOVUE-300  IOPAMIDOL (ISOVUE-300) INJECTION 61% COMPARISON:  None. FINDINGS: Lower chest: No pleural or pericardial effusion. Mild dependent atelectasis is noted. Heart size is normal. Hepatobiliary: The liver is diffusely low attenuating consistent with fatty infiltration. No focal lesion. The gallbladder and biliary tree appear normal. Pancreas: Unremarkable. No pancreatic ductal dilatation or surrounding inflammatory changes. Spleen: Normal in size without focal abnormality. Adrenals/Urinary Tract: Adrenal glands are unremarkable. Kidneys are normal, without renal calculi, focal lesion, or hydronephrosis. Bladder is unremarkable. Stomach/Bowel: Multiple loops of small bowel in the right abdomen and pelvis demonstrate wall thickening and decreased enhancement. There is interloop fluid and a small volume of free pelvic fluid. No evidence of small bowel obstruction. The colon appears normal. Small hiatal hernia is seen. No pneumatosis, portal venous gas or free intraperitoneal air is identified. Vascular/Lymphatic: No significant  vascular findings are present. No enlarged abdominal or pelvic lymph nodes. Reproductive: Uterus and bilateral adnexa are unremarkable. Other: None. Musculoskeletal: No fracture or worrisome lesion. IMPRESSION: Abnormal appearance of multiple loops of small bowel is described above could be due to severe enteritis but bowel ischemia is within the differential. Fatty infiltration of the liver. These results were called by telephone at the time of interpretation on 07/29/2016 at 9:47 am to Dr. Shirlyn Goltz , who verbally acknowledged these results. Electronically Signed   By: Inge Rise M.D.   On: 07/29/2016 09:50   Dg Abd Acute W/chest  Result Date: 07/29/2016 CLINICAL DATA:  Low to mid abdomen pain, nausea and vomit EXAM: DG ABDOMEN ACUTE W/ 1V CHEST COMPARISON:  Chest x-ray of 03/15/2012 FINDINGS: No active infiltrate or effusion is seen. Mediastinal and hilar contours are unremarkable. The heart is borderline enlarged any tool lead permanent pacemaker remains. Supine and left the decubitus films of the abdomen were obtained. No bowel obstruction is seen, with a relative paucity of bowel gas. No free air is seen on the decubitus image. No opaque calculi are seen. A rounded calcification is present in the left bony pelvis possibly a phlebolith, with bladder calculus unlikely. IMPRESSION: 1. No active lung disease. Stable borderline cardiomegaly with permanent pacemaker. 2. No bowel obstruction or free air. 3. Calcification in the left bony pelvis could be a phlebolith, with bladder calculus less likely. Electronically Signed   By: Ivar Drape M.D.   On: 07/29/2016 08:35    Cardiac Studies   Echo pending  Patient Profile     76 y.o. 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 She has no history of CAD or MI. Previous LVF has been normal- 50-55%. She presented 07/29/16 with presumed abdominal pain and her  Troponin was elevated- pk 1.95.   Assessment & Plan    1. Troponin level elevated- presumed secondary to demand ischemia. Check echo.  2. Enteritis- symptoms improving. No history of abdominal pain after eating or wgt loss- doubt ischemic bowel.   3. History of pacemaker for bradycardia- MDT PTVDP 2013.  4. ? PAF- see 7am telemetry strips.   4. Parkinsons   Plan- Check echo. Will review telemetry strips with MD. Clearly in NSR and V paced at times,  but also PVCs and periods of irregularly irregular rhythm with no obvious P wave. Pt would be CHADs VASc 2 for age and sex.   Signed, Kerin Ransom, PA-C  07/30/2016, 9:38 AM    Attending Note:   The patient was seen and examined.  Agree  with assessment and plan as noted above.  Changes made to the above note as needed.  Patient seen and independently examined with Kerin Ransom, PA.   We discussed all aspects of the encounter. I agree with the assessment and plan as stated above.  1. Elevated troponin levels. Likely due to demand ischemia in the setting of mesenteric ischemia .  Echo has been ordered - is pending   2. Possible PAF:  Will have the pacer interrogated  If present, we still might attribute this brief episode of AF to her mesenteric ischemia      I have spent a total of 40 minutes with patient reviewing hospital  notes , telemetry, EKGs, labs and examining patient as well as establishing an assessment and plan that was discussed with the patient. > 50% of time was spent in direct patient care.    Thayer Headings, Brooke Bonito., MD, [facility] 07/30/2016, 11:03 AM [address]  [place] Pager [phone]

## 2016-07-30 NOTE — Progress Notes (Signed)
  Echocardiogram 2D Echocardiogram has been performed.  Donata Clay 07/30/2016, 12:23 PM

## 2016-07-31 DIAGNOSIS — R935 Abnormal findings on diagnostic imaging of other abdominal regions, including retroperitoneum: Secondary | ICD-10-CM

## 2016-07-31 DIAGNOSIS — Z950 Presence of cardiac pacemaker: Secondary | ICD-10-CM

## 2016-07-31 DIAGNOSIS — I5021 Acute systolic (congestive) heart failure: Secondary | ICD-10-CM | POA: Diagnosis present

## 2016-07-31 DIAGNOSIS — I214 Non-ST elevation (NSTEMI) myocardial infarction: Secondary | ICD-10-CM

## 2016-07-31 LAB — GASTROINTESTINAL PANEL BY PCR, STOOL (REPLACES STOOL CULTURE)
ADENOVIRUS F40/41: NOT DETECTED
ASTROVIRUS: NOT DETECTED
CRYPTOSPORIDIUM: NOT DETECTED
CYCLOSPORA CAYETANENSIS: NOT DETECTED
Campylobacter species: NOT DETECTED
ENTEROAGGREGATIVE E COLI (EAEC): NOT DETECTED
ENTEROPATHOGENIC E COLI (EPEC): NOT DETECTED
Entamoeba histolytica: NOT DETECTED
Enterotoxigenic E coli (ETEC): NOT DETECTED
GIARDIA LAMBLIA: NOT DETECTED
Norovirus GI/GII: NOT DETECTED
Plesimonas shigelloides: NOT DETECTED
Rotavirus A: NOT DETECTED
Salmonella species: NOT DETECTED
Sapovirus (I, II, IV, and V): NOT DETECTED
Shiga like toxin producing E coli (STEC): NOT DETECTED
Shigella/Enteroinvasive E coli (EIEC): NOT DETECTED
VIBRIO SPECIES: NOT DETECTED
Vibrio cholerae: NOT DETECTED
YERSINIA ENTEROCOLITICA: NOT DETECTED

## 2016-07-31 LAB — BASIC METABOLIC PANEL
ANION GAP: 6 (ref 5–15)
BUN: 7 mg/dL (ref 6–20)
CHLORIDE: 101 mmol/L (ref 101–111)
CO2: 22 mmol/L (ref 22–32)
Calcium: 8.5 mg/dL — ABNORMAL LOW (ref 8.9–10.3)
Creatinine, Ser: 0.78 mg/dL (ref 0.44–1.00)
GFR calc Af Amer: >60 mL/min (ref >60)
GFR calc non Af Amer: >60 mL/min (ref >60)
GLUCOSE: 113 mg/dL — AB (ref 65–99)
POTASSIUM: 3.7 mmol/L (ref 3.5–5.1)
Sodium: 129 mmol/L — ABNORMAL LOW (ref 135–145)

## 2016-07-31 LAB — T3, FREE: T3, Free: 2 pg/mL (ref 2.0–4.4)

## 2016-07-31 LAB — CBC
HEMATOCRIT: 35.3 % — AB (ref 36.0–46.0)
HEMOGLOBIN: 11.7 g/dL — AB (ref 12.0–15.0)
MCH: 32.7 pg (ref 26.0–34.0)
MCHC: 33.1 g/dL (ref 30.0–36.0)
MCV: 98.6 fL (ref 78.0–100.0)
Platelets: 102 10*3/uL — ABNORMAL LOW (ref 150–400)
RBC: 3.58 MIL/uL — ABNORMAL LOW (ref 3.87–5.11)
RDW: 12.9 % (ref 11.5–15.5)
WBC: 16.1 10*3/uL — ABNORMAL HIGH (ref 4.0–10.5)

## 2016-07-31 LAB — GLUCOSE, CAPILLARY
GLUCOSE-CAPILLARY: 87 mg/dL (ref 65–99)
GLUCOSE-CAPILLARY: 105 mg/dL — AB (ref 65–99)
Glucose-Capillary: 102 mg/dL — ABNORMAL HIGH (ref 65–99)

## 2016-07-31 LAB — MAGNESIUM: Magnesium: 1.8 mg/dL (ref 1.7–2.4)

## 2016-07-31 MED ORDER — ONDANSETRON HCL 4 MG PO TABS
8.0000 mg | ORAL_TABLET | Freq: Four times a day (QID) | ORAL | Status: DC | PRN
  Administered 2016-08-03: 8 mg via ORAL
  Filled 2016-07-31: qty 2

## 2016-07-31 MED ORDER — ATORVASTATIN CALCIUM 80 MG PO TABS
80.0000 mg | ORAL_TABLET | Freq: Every day | ORAL | Status: DC
  Administered 2016-07-31 – 2016-08-03 (×4): 80 mg via ORAL
  Filled 2016-07-31 (×4): qty 1

## 2016-07-31 MED ORDER — ASPIRIN 81 MG PO CHEW
81.0000 mg | CHEWABLE_TABLET | Freq: Every day | ORAL | Status: DC
  Administered 2016-07-31 – 2016-08-04 (×4): 81 mg via ORAL
  Filled 2016-07-31 (×5): qty 1

## 2016-07-31 MED ORDER — PROMETHAZINE HCL 25 MG/ML IJ SOLN
12.5000 mg | Freq: Once | INTRAMUSCULAR | Status: AC
  Administered 2016-07-31: 12.5 mg via INTRAVENOUS
  Filled 2016-07-31: qty 1

## 2016-07-31 MED ORDER — PROMETHAZINE HCL 25 MG/ML IJ SOLN
6.2500 mg | Freq: Three times a day (TID) | INTRAMUSCULAR | Status: DC | PRN
  Administered 2016-08-01 – 2016-08-02 (×4): 6.25 mg via INTRAVENOUS
  Filled 2016-07-31 (×4): qty 1

## 2016-07-31 MED ORDER — ONDANSETRON HCL 4 MG/2ML IJ SOLN
4.0000 mg | Freq: Four times a day (QID) | INTRAMUSCULAR | Status: DC | PRN
  Administered 2016-07-31 – 2016-08-01 (×2): 4 mg via INTRAVENOUS
  Filled 2016-07-31 (×3): qty 2

## 2016-07-31 NOTE — Progress Notes (Signed)
PROGRESS NOTE    Lindsey Bryant  BHA:[id number] DOB: 12-Mar-1941 DOA: 07/29/2016 PCP: [name], MD   Brief Narrative:  76 year old female with history of Parkinson's disease, IBS, complete heart block s/p pacemaker who came to the ED with complaints of abrupt onset lower abdominal pain, nausea, and vomiting. CT abdomen/pelvis showed small bowel wall thickening and decreased enhancement without SBO or colonic abnormality. IV fluids and zosyn were given, surgery was consulted and TRH admitted to the SDU. General surgery suspected gastroenteritis, doubtful of mesenteric ischemia, and recommended bowel rest and IV hydration. With advancing diet, she had nausea alone until 3/10 when she had 2 episodes of nonbloody emesis and several episodes of blood in loose stools. So far today she has been able to eat clear and full liquids. Due to bleeding, GI has been consulted. Troponin elevation was noted on initial work up prompting cardiology evaluation and echocardiogram. The patient denies chest pain, dyspnea, and has an active lifestyle. Surprisingly, the echo showed EF 35-40% (previously 50-55% without RWMA in 2014) with mid-apical anterior/inferior akinesis. Clinically she has improved with resolution of abdominal pain. GI pathogen panel is pending.   Assessment & Plan:   Principal Problem:   Abdominal pain Active Problems:   PARKINSON'S   Pacemaker-Medtronic   Elevated lactic acid level   Elevated troponin   Hypokalemia   Enteritis   Hyperglycemia   Elevated TSH   Lactic acidosis   NSTEMI (non-ST elevated myocardial infarction) (HCC)  Abdominal pain, N/V: Enteritis vs. mesenteric ischemia thought to be most likely possibilities. Lactate trended downward and exam is reassuring that any ischmia is not ongoing, surgery has signed off. - GI consulted due to bloody diarrhea, will continue liquid diet for now as she is tolerating this.  - Antiemetics as needed - IV fluids were stopped when she  started taking po, she is taking sufficient per oral intake currently to leave IV fluids off.   - GI pathogen panel from 3/8 is pending.  - Continue contact isolation - Zosyn was started, will continue pending GI pathogen panel.   GI bleeding: +FOBT, surgery does not believe this is surgical, recommended GI consultation. Has had colonoscopy by Dr. Henrene Pastor in 2015 with a single diminutive rectal polyp which was cold snared. - Consult [facility], Dr. Havery Moros  New onset systolic dysfunction: Echo checked for elevated troponin thought by cardiology to represent demand ischemia, showed EF 35-40% (previously 50-55% without RWMA in 2014) with mid-apical anterior/inferior akinesis. Also showed severe LA dilation. Had reported VTach per EMS, since resolved, thought to have been artifact.  - Appears euvolemic - Cardiology following  - May need pacemaker interrogated. Will defer to cardiology  Hypokalemia: Resolved. - Monitor with GI losses  Parkinson's disease: Chronic, stable - Continue home medications: sinemet  Hyperglycemia: Resolved, CBGs stable. HbA1c 5.3%.  - DC CBG's and SSI.  DVT prophylaxis:  SCDs Code Status:  Full code Family Communication:  Daughter and husband present at bedside. Disposition Plan: Ongoing work up.   Consultants:   General surgery, Dr. Georgette Dover  Cardiology, Dr. Acie Fredrickson  Gastroenterology, Dr. Havery Moros  Procedures:   None  Antimicrobials:   Zosyn started on 3/8  Subjective: Abdominal pain gone, had nonbloody emesis last night and intermittent nausea but is very hungry and tolerated breakfast x2 (clears and full liquids). Had bloody loose stools overnight, none today. Denies any chest pain, dyspnea, palpitations, orthopnea, leg swelling.   Objective: Vitals:   07/30/16 1205 07/30/16 2048 07/31/16 0359 07/31/16 0927  BP:  105/61 110/61 117/75 (!) 113/56  Pulse: 69 92 95 70  Resp: 19 20 16 15   Temp: 98.2 F (36.8 C) 98.2 F (36.8 C) 97.9 F (36.6 C)  97.6 F (36.4 C)  TempSrc: Oral Oral Oral Oral  SpO2: 100% 100% 100% 99%  Weight:  66.4 kg (146 lb 4.8 oz)    Height:        Intake/Output Summary (Last 24 hours) at 07/31/16 1152 Last data filed at 07/31/16 0910  Gross per 24 hour  Intake              650 ml  Output                0 ml  Net              650 ml   Filed Weights   07/29/16 1403 07/30/16 0500 07/30/16 2048  Weight: 61.9 kg (136 lb 7.4 oz) 64.7 kg (142 lb 10.2 oz) 66.4 kg (146 lb 4.8 oz)    Examination: General exam: 76yo M in no distress Respiratory system: Clear to auscultation. Respiratory effort normal. Cardiovascular system: S1 & S2 heard, RRR. No JVD, murmurs, rubs, gallops or clicks. No pedal edema. Gastrointestinal system: Abdomen is nondistended, soft and nontender. No organomegaly or masses felt. Normal bowel sounds heard. Central nervous system: Alert and oriented. No focal neurological deficits. Intention tremor and bradykinesia noted. Extremities: Warm, no deformities Skin: No rashes, lesions or ulcers Psychiatry: Judgement and insight appear normal. Mood & affect appropriate.   Data Reviewed:   CBC:  Recent Labs Lab 07/29/16 0731 07/30/16 0334  WBC 20.9* 17.7*  NEUTROABS 16.7*  --   HGB 14.0 12.8  HCT 42.1 39.4  MCV 99.5 99.0  PLT 162 413*   Basic Metabolic Panel:  Recent Labs Lab 07/29/16 0731 07/30/16 0334  NA 138 138  K 3.2* 4.4  CL 105 114*  CO2 22 18*  GLUCOSE 231* 122*  BUN 12 10  CREATININE 0.86 0.74  CALCIUM 9.0 7.6*   GFR: Estimated Creatinine Clearance: 55.1 mL/min (by C-G formula based on SCr of 0.74 mg/dL). Liver Function Tests:  Recent Labs Lab 07/29/16 0731  AST 22  ALT 12*  ALKPHOS 41  BILITOT 0.6  PROT 6.6  ALBUMIN 3.8    Recent Labs Lab 07/29/16 0731  LIPASE 27   No results for input(s): AMMONIA in the last 168 hours. Coagulation Profile:  Recent Labs Lab 07/29/16 1400  INR 1.05   Cardiac Enzymes:  Recent Labs Lab 07/29/16 1400  07/29/16 1920  TROPONINI 1.95* 1.58*   BNP (last 3 results) No results for input(s): PROBNP in the last 8760 hours. HbA1C:  Recent Labs  07/29/16 1242  HGBA1C 5.3   CBG:  Recent Labs Lab 07/30/16 1810 07/30/16 2003 07/31/16 0001 07/31/16 0358 07/31/16 0738  GLUCAP 132* 125* 143* 102* 87   Lipid Profile: No results for input(s): CHOL, HDL, LDLCALC, TRIG, CHOLHDL, LDLDIRECT in the last 72 hours. Thyroid Function Tests:  Recent Labs  07/29/16 0834 07/29/16 1208 07/30/16 1448  TSH 6.519*  --   --   FREET4  --  0.84  --   T3FREE  --   --  2.0   Anemia Panel: No results for input(s): VITAMINB12, FOLATE, FERRITIN, TIBC, IRON, RETICCTPCT in the last 72 hours. Sepsis Labs:  Recent Labs Lab 07/29/16 1212 07/29/16 1920 07/29/16 2215 07/30/16 0334  LATICACIDVEN 4.2* 4.2* 3.9* 2.0*    Recent Results (from the past 240 hour(s))  Blood culture (routine x 2)     Status: None (Preliminary result)   Collection Time: 07/29/16  7:32 AM  Result Value Ref Range Status   Specimen Description BLOOD LEFT ANTECUBITAL  Final   Special Requests BOTTLES DRAWN AEROBIC AND ANAEROBIC  5CC  Final   Culture NO GROWTH 2 DAYS  Final   Report Status PENDING  Incomplete  Blood culture (routine x 2)     Status: None (Preliminary result)   Collection Time: 07/29/16  8:34 AM  Result Value Ref Range Status   Specimen Description BLOOD RIGHT HAND  Final   Special Requests BOTTLES DRAWN AEROBIC ONLY  3CC  Final   Culture NO GROWTH 2 DAYS  Final   Report Status PENDING  Incomplete  MRSA PCR Screening     Status: None   Collection Time: 07/29/16  2:28 PM  Result Value Ref Range Status   MRSA by PCR NEGATIVE NEGATIVE Final    Comment:        The GeneXpert MRSA Assay (FDA approved for NASAL specimens only), is one component of a comprehensive MRSA colonization surveillance program. It is not intended to diagnose MRSA infection nor to guide or monitor treatment for MRSA infections.       Radiology Studies: No results found.  Scheduled Meds: . calcium carbonate  1 tablet Oral BID WC  . carbidopa-levodopa  2 tablet Oral QID  . insulin aspart  0-9 Units Subcutaneous Q4H  . piperacillin-tazobactam (ZOSYN)  IV  3.375 g Intravenous Q8H  . potassium chloride  40 mEq Oral Once  . sodium chloride flush  3 mL Intravenous Q12H    LOS: 2 days   Time spent: 25 mins   Vance Gather, MD [facility] Pager [phone]   If 7PM-7AM, please contact night-coverage [email or web address] Password [email or web address] 07/31/2016, 11:52 AM

## 2016-07-31 NOTE — Progress Notes (Signed)
Pt complains of nausea. Zofran given with no relief. Now pt is vomiting. Denies abdominal pain. NP paged.

## 2016-07-31 NOTE — Progress Notes (Signed)
DAILY PROGRESS NOTE  Subjective:  Echo yesterday shows drop in LVEF to 35-40% with anterior and inferior wall motion abnormalities. Troponin initially was 1.95 and then 1.58. Surgery is not convinced she has mesenteric ischemia - presentation more consistent with enteritis. Still denies chest pain.  Objective:  Temp:  [97.6 F (36.4 C)-98.2 F (36.8 C)] 97.6 F (36.4 C) (03/10 0927) Pulse Rate:  [70-95] 70 (03/10 0927) Resp:  [15-20] 15 (03/10 0927) BP: (110-117)/(56-75) 113/56 (03/10 0927) SpO2:  [99 %-100 %] 99 % (03/10 0927) Weight:  [146 lb 4.8 oz (66.4 kg)] 146 lb 4.8 oz (66.4 kg) (03/09 2048) Weight change: 9 lb 13.4 oz (4.461 kg)  Intake/Output from previous day: 03/09 0701 - 03/10 0700 In: 1070 [P.O.:720; I.V.:300; IV Piggyback:50] Out: 300 [Urine:300]  Intake/Output from this shift: Total I/O In: 410 [P.O.:360; IV Piggyback:50] Out: -   Medications: No current facility-administered medications on file prior to encounter.    Current Outpatient Prescriptions on File Prior to Encounter  Medication Sig Dispense Refill  . aspirin EC 81 MG tablet Take 81 mg by mouth at bedtime.    . carbidopa-levodopa (SINEMET IR) 25-100 MG per tablet Take 2 tablets by mouth 4 (four) times daily.     . clidinium-chlordiazePOXIDE (LIBRAX) 5-2.5 MG per capsule Take 1 capsule by mouth 3 (three) times daily as needed. 60 capsule 0  . docusate sodium (COLACE) 100 MG capsule Take 100 mg by mouth daily.    Marland Kitchen estrogens, conjugated, (PREMARIN) 0.3 MG tablet Take 1 tablet (0.3 mg total) by mouth at bedtime. 90 tablet 3  . medroxyPROGESTERone (PROVERA) 2.5 MG tablet TAKE 1 TABLET (2.5 MG TOTAL) BY MOUTH DAILY. 90 tablet 3  . mirabegron ER (MYRBETRIQ) 25 MG TB24 tablet Take 1 tablet (25 mg total) by mouth daily. 90 tablet 1  . Omega-3 1000 MG CAPS Take 1 capsule by mouth daily.    . polyethylene glycol (MIRALAX / GLYCOLAX) packet Take 17 g by mouth daily as needed.      Physical  Exam: General appearance: alert and no distress Neck: no carotid bruit and no JVD Lungs: clear to auscultation bilaterally Heart: regular rate and rhythm, S1, S2 normal, no murmur, click, rub or gallop Abdomen: mild TTP, no rebound or guarding Extremities: extremities normal, atraumatic, no cyanosis or edema Pulses: 2+ and symmetric Skin: Skin color, texture, turgor normal. No rashes or lesions Neurologic: Grossly normal Psych: Pleasant  Lab Results: Results for orders placed or performed during the hospital encounter of 07/29/16 (from the past 48 hour(s))  Troponin I     Status: Abnormal   Collection Time: 07/29/16  2:00 PM  Result Value Ref Range   Troponin I 1.95 (HH) <0.03 ng/mL    Comment: CRITICAL RESULT CALLED TO, READ BACK BY AND VERIFIED WITH: H DENTON,RN 1636 07/29/2016 WBOND   Protime-INR     Status: None   Collection Time: 07/29/16  2:00 PM  Result Value Ref Range   Prothrombin Time 13.8 11.4 - 15.2 seconds   INR 1.05   MRSA PCR Screening     Status: None   Collection Time: 07/29/16  2:28 PM  Result Value Ref Range   MRSA by PCR NEGATIVE NEGATIVE    Comment:        The GeneXpert MRSA Assay (FDA approved for NASAL specimens only), is one component of a comprehensive MRSA colonization surveillance program. It is not intended to diagnose MRSA infection nor to guide or monitor treatment for MRSA  infections.   Glucose, capillary     Status: Abnormal   Collection Time: 07/29/16  4:18 PM  Result Value Ref Range   Glucose-Capillary 149 (H) 65 - 99 mg/dL  Occult blood card to lab, stool RN will collect     Status: Abnormal   Collection Time: 07/29/16  7:12 PM  Result Value Ref Range   Fecal Occult Bld POSITIVE (A) NEGATIVE  Troponin I     Status: Abnormal   Collection Time: 07/29/16  7:20 PM  Result Value Ref Range   Troponin I 1.58 (HH) <0.03 ng/mL    Comment: CRITICAL VALUE NOTED.  VALUE IS CONSISTENT WITH PREVIOUSLY REPORTED AND CALLED VALUE.  Lactic acid,  plasma     Status: Abnormal   Collection Time: 07/29/16  7:20 PM  Result Value Ref Range   Lactic Acid, Venous 4.2 (HH) 0.5 - 1.9 mmol/L    Comment: CRITICAL RESULT CALLED TO, READ BACK BY AND VERIFIED WITH: K GARICA,RN 2036 07/29/2016 WBOND   Glucose, capillary     Status: Abnormal   Collection Time: 07/29/16  7:35 PM  Result Value Ref Range   Glucose-Capillary 152 (H) 65 - 99 mg/dL  Lactic acid, plasma     Status: Abnormal   Collection Time: 07/29/16 10:15 PM  Result Value Ref Range   Lactic Acid, Venous 3.9 (HH) 0.5 - 1.9 mmol/L    Comment: CRITICAL RESULT CALLED TO, READ BACK BY AND VERIFIED WITH: YORK,M RN 07/29/2016 2332 JORDANS   Glucose, capillary     Status: Abnormal   Collection Time: 07/29/16 11:00 PM  Result Value Ref Range   Glucose-Capillary 137 (H) 65 - 99 mg/dL  Basic metabolic panel     Status: Abnormal   Collection Time: 07/30/16  3:34 AM  Result Value Ref Range   Sodium 138 135 - 145 mmol/L   Potassium 4.4 3.5 - 5.1 mmol/L    Comment: NO VISIBLE HEMOLYSIS   Chloride 114 (H) 101 - 111 mmol/L   CO2 18 (L) 22 - 32 mmol/L   Glucose, Bld 122 (H) 65 - 99 mg/dL   BUN 10 6 - 20 mg/dL   Creatinine, Ser 0.74 0.44 - 1.00 mg/dL   Calcium 7.6 (L) 8.9 - 10.3 mg/dL   GFR calc non Af Amer >60 >60 mL/min   GFR calc Af Amer >60 >60 mL/min    Comment: (NOTE) The eGFR has been calculated using the CKD EPI equation. This calculation has not been validated in all clinical situations. eGFR's persistently <60 mL/min signify possible Chronic Kidney Disease.    Anion gap 6 5 - 15  CBC     Status: Abnormal   Collection Time: 07/30/16  3:34 AM  Result Value Ref Range   WBC 17.7 (H) 4.0 - 10.5 K/uL   RBC 3.98 3.87 - 5.11 MIL/uL   Hemoglobin 12.8 12.0 - 15.0 g/dL   HCT 39.4 36.0 - 46.0 %   MCV 99.0 78.0 - 100.0 fL   MCH 32.2 26.0 - 34.0 pg   MCHC 32.5 30.0 - 36.0 g/dL   RDW 12.6 11.5 - 15.5 %   Platelets 121 (L) 150 - 400 K/uL  Lactic acid, plasma     Status: Abnormal    Collection Time: 07/30/16  3:34 AM  Result Value Ref Range   Lactic Acid, Venous 2.0 (HH) 0.5 - 1.9 mmol/L    Comment: CRITICAL RESULT CALLED TO, READ BACK BY AND VERIFIED WITH: Joyce Gross [phone] WILDERK   Glucose,  capillary     Status: Abnormal   Collection Time: 07/30/16  4:02 AM  Result Value Ref Range   Glucose-Capillary 104 (H) 65 - 99 mg/dL  Glucose, capillary     Status: Abnormal   Collection Time: 07/30/16  8:03 AM  Result Value Ref Range   Glucose-Capillary 104 (H) 65 - 99 mg/dL  Glucose, capillary     Status: Abnormal   Collection Time: 07/30/16 12:08 PM  Result Value Ref Range   Glucose-Capillary 130 (H) 65 - 99 mg/dL  T3, free     Status: None   Collection Time: 07/30/16  2:48 PM  Result Value Ref Range   T3, Free 2.0 2.0 - 4.4 pg/mL    Comment: (NOTE) Performed At: [facility] [address] [place], [place] [id number] Lindon Romp MD FT:[phone]   Glucose, capillary     Status: Abnormal   Collection Time: 07/30/16  6:10 PM  Result Value Ref Range   Glucose-Capillary 132 (H) 65 - 99 mg/dL  Glucose, capillary     Status: Abnormal   Collection Time: 07/30/16  8:03 PM  Result Value Ref Range   Glucose-Capillary 125 (H) 65 - 99 mg/dL  Glucose, capillary     Status: Abnormal   Collection Time: 07/31/16 12:01 AM  Result Value Ref Range   Glucose-Capillary 143 (H) 65 - 99 mg/dL  Glucose, capillary     Status: Abnormal   Collection Time: 07/31/16  3:58 AM  Result Value Ref Range   Glucose-Capillary 102 (H) 65 - 99 mg/dL  Glucose, capillary     Status: None   Collection Time: 07/31/16  7:38 AM  Result Value Ref Range   Glucose-Capillary 87 65 - 99 mg/dL   Comment 1 Notify RN   Glucose, capillary     Status: Abnormal   Collection Time: 07/31/16 12:22 PM  Result Value Ref Range   Glucose-Capillary 105 (H) 65 - 99 mg/dL    Imaging: No results found.  Assessment:  1. Principal Problem: 2.   Abdominal pain 3. Active Problems: 4.    PARKINSON'S 5.   Pacemaker-Medtronic 6.   Elevated lactic acid level 7.   Elevated troponin 8.   Hypokalemia 9.   Enteritis 10.   Hyperglycemia 11.   Elevated TSH 12.   Lactic acidosis 13.   NSTEMI (non-ST elevated myocardial infarction) ([place]) 14.   Plan:  1. Newly reduced LVEF to 35-40% with regional wall motion abnormalities and NSTEMI on cath. Mesenteric ischemia is not suspected. Maybe an enteroinvasive gastroenteritis - pathogens pending. Would recommend GI evaluation given ongoing hematochezia. Ultimately, would recommend left and right heart catheterization next week once we have a better sense of why she is having GI bleeding and could reasonably commit her to antiplatelet therapy if necessary. Agree with holding off on heparin. Reasonable to start low dose aspirin given NSTEMI. Start lipitor 80 mg QHS for plaque stabilization and recheck FLP in am tomorrow- LDL-C was 108 in 2016.  Time Spent Directly with Patient:  15 minutes  Length of Stay:  LOS: 2 days   Pixie Casino, MD, [facility] Attending Cardiologist [place] 07/31/2016, 1:25 PM

## 2016-07-31 NOTE — Consult Note (Signed)
HPI :  76 y/o female with history of Parkinson's, pacemaker for bradycardia, melanoma, IBS, who was admitted on the morning of 07/29/16 with severe lower abdominal pain which woke her from sleep. CT scan at time of admission showed wall thickening with decreased enhancement of the ileum. General surgery was consulted who thought this was most consistent with a severe enteritis. She reports the day after her pain started she developed loose stool with some blood in it. She's had 2 loose stools Friday which were grossly bloody, another bowel movement this morning with a small amount of blood, and then another bowel movement when I was examining her which was mostly brown stool. She states her pain has intervally resolved and she is tolerating liquid diet. She had a leukocytosis with elevated lactate on admission without anemia. Stool studies sent for GI pathogen panel are pending. Of note she has had a troponin elevation with echocardiogram showing depressed EF with wall motion abnormalities. Cardiology recommending cardiac cath at some point in time.   Last colonoscopy 08/2013 was normal other than small rectal polyp.  Past Medical History:  Diagnosis Date  . Bradycardia    a. 02/2012  . Hyperglycemia   . IBS (irritable bowel syndrome)   . Melanoma ([place])    right arm  . 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 Parkinson's disease      Past Surgical History:  Procedure Laterality Date  . APPENDECTOMY  1952  . MELANOMA EXCISION Right 2000   right arm  . PACEMAKER PLACEMENT  05/2012  . PACEMAKER REVISION N/A 03/14/2012   Procedure: PACEMAKER REVISION;  Surgeon: Thompson Grayer, MD;  Location: [facility] CATH LAB;  Service: Cardiovascular;  Laterality: N/A;  . PERMANENT PACEMAKER INSERTION N/A 03/06/2012   Procedure: PERMANENT PACEMAKER INSERTION;  Surgeon: Deboraha Sprang, MD;  Location: [facility] CATH LAB;  Service: Cardiovascular;  Laterality: N/A;  . 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 Alcohol abuse Father   . Hypothyroidism Daughter   . Heart attack Mother 24    Details unclear  . Diabetes Brother   . Breast cancer    . Hypothyroidism Daughter   . Colon cancer Neg Hx    Social History  Substance Use Topics  . Smoking status: Former Research scientist (life sciences)  . Smokeless tobacco: Never Used     Comment: 10 yrs in college  . Alcohol use No   Current Facility-Administered Medications  Medication Dose Route Frequency Provider Last Rate Last Dose  . acetaminophen (TYLENOL) tablet 650 mg  650 mg Oral Q6H PRN Radene Gunning, NP       Or  . acetaminophen (TYLENOL) suppository 650 mg  650 mg Rectal Q6H PRN Radene Gunning, NP      . aspirin chewable tablet 81 mg  81 mg Oral Daily Pixie Casino, MD      . atorvastatin (LIPITOR) tablet 80 mg  80 mg Oral q1800 Pixie Casino, MD      . calcium carbonate (TUMS - dosed in mg elemental calcium) chewable tablet 200 mg of elemental calcium  1 tablet Oral BID WC Ankit Chirag Amin, MD   200 mg of elemental calcium at 07/31/16 0910  . carbidopa-levodopa (SINEMET IR) 25-100 MG per tablet immediate release 2 tablet  2 tablet Oral QID Radene Gunning, NP   2 tablet at 07/31/16 1225  . morphine 4 MG/ML injection 1 mg  1 mg Intravenous Q3H PRN Radene Gunning, NP      . ondansetron [facility]) tablet 8 mg  8 mg Oral Q6H PRN Patrecia Pour, MD       Or  . ondansetron [facility]) injection 4 mg  4 mg Intravenous Q6H PRN Patrecia Pour, MD      . piperacillin-tazobactam (ZOSYN) IVPB 3.375 g  3.375 g Intravenous Q8H Romona Curls, RPH   3.375 g at 07/31/16 0910  . potassium chloride SA (K-DUR,KLOR-CON) CR tablet 40 mEq  40 mEq Oral Once Ledell Noss, MD      . sodium chloride flush (NS) 0.9 % injection 3 mL  3 mL Intravenous Q12H Radene Gunning, NP   3 mL at 07/30/16 0819   No Known Allergies   Review of Systems: All systems reviewed and negative except where noted in HPI.    Ct Abdomen Pelvis W Contrast  Result  Date: 07/29/2016 CLINICAL DATA:  Abdominal pain, nausea, vomiting and diarrhea. EXAM: CT ABDOMEN AND PELVIS WITH CONTRAST TECHNIQUE: Multidetector CT imaging of the abdomen and pelvis was performed using the standard protocol following bolus administration of intravenous contrast. CONTRAST:  100 ml ISOVUE-300 IOPAMIDOL (ISOVUE-300) INJECTION 61% COMPARISON:  None. FINDINGS: Lower chest: No pleural or pericardial effusion. Mild dependent atelectasis is noted. Heart size is normal. Hepatobiliary: The liver is diffusely low attenuating consistent with fatty infiltration. No focal lesion. The gallbladder and biliary tree appear normal. Pancreas: Unremarkable. No pancreatic ductal dilatation or surrounding inflammatory changes. Spleen: Normal in size without focal abnormality. Adrenals/Urinary Tract: Adrenal glands are unremarkable. Kidneys are normal, without renal calculi, focal lesion, or hydronephrosis. Bladder is unremarkable. Stomach/Bowel: Multiple loops of small bowel in the right abdomen and pelvis demonstrate wall thickening and decreased enhancement. There is interloop fluid and a small volume of free pelvic fluid. No evidence of small bowel obstruction. The colon appears normal. Small hiatal hernia is seen. No pneumatosis, portal venous gas or free intraperitoneal air is identified. Vascular/Lymphatic: No significant vascular findings are present. No enlarged abdominal or pelvic lymph nodes. Reproductive: Uterus and bilateral adnexa are unremarkable. Other: None. Musculoskeletal: No fracture or worrisome lesion. IMPRESSION: Abnormal appearance of multiple loops of small bowel is described above could be due to severe enteritis but bowel ischemia is within the differential. Fatty infiltration of the liver. These results were called by telephone at the time of interpretation on 07/29/2016 at 9:47 am to Dr. Shirlyn Goltz , who verbally acknowledged these results. Electronically Signed   By: Inge Rise M.D.   On:  07/29/2016 09:50   Dg Abd Acute W/chest  Result Date: 07/29/2016 CLINICAL DATA:  Low to mid abdomen pain, nausea and vomit EXAM: DG ABDOMEN ACUTE W/ 1V CHEST COMPARISON:  Chest x-ray of 03/15/2012 FINDINGS: No active infiltrate or effusion is seen. Mediastinal and hilar contours are unremarkable. The heart is borderline enlarged any tool lead permanent pacemaker remains. Supine and left the decubitus films of the abdomen were obtained. No bowel obstruction is seen, with a relative paucity of bowel gas. No free air is seen on the decubitus image. No opaque calculi are seen. A rounded calcification is present in the left bony pelvis possibly a phlebolith, with bladder calculus unlikely. IMPRESSION: 1. No active lung disease. Stable borderline cardiomegaly with permanent pacemaker. 2. No bowel obstruction or free air. 3. Calcification in the left bony pelvis could be a phlebolith, with bladder calculus less likely. Electronically Signed   By: Windy Canny.D.  On: 07/29/2016 08:35   Lab Results  Component Value Date   WBC 17.7 (H) 07/30/2016   HGB 12.8 07/30/2016   HCT 39.4 07/30/2016   MCV 99.0 07/30/2016   PLT 121 (L) 07/30/2016    Lab Results  Component Value Date   CREATININE 0.74 07/30/2016   BUN 10 07/30/2016   NA 138 07/30/2016   K 4.4 07/30/2016   CL 114 (H) 07/30/2016   CO2 18 (L) 07/30/2016    Lab Results  Component Value Date   ALT 12 (L) 07/29/2016   AST 22 07/29/2016   ALKPHOS 41 07/29/2016   BILITOT 0.6 07/29/2016      Physical Exam: BP (!) 113/56 (BP Location: Left Arm)   Pulse 70   Temp 97.6 F (36.4 C) (Oral)   Resp 15   Ht 5\' 2"  (1.575 m)   Wt 146 lb 4.8 oz (66.4 kg)   SpO2 99%   BMI 26.76 kg/m  Constitutional: Pleasant,well-developed, female in no acute distress. HEENT: Normocephalic and atraumatic. Conjunctivae are normal. No scleral icterus. Neck supple.  Cardiovascular: Normal rate, regular rhythm.  Pulmonary/chest: Effort normal and breath sounds  normal.  Abdominal: Soft, nondistended, nontender.  Extremities: no edema Lymphadenopathy: No cervical adenopathy noted. Neurological: Alert and oriented to person place and time. Skin: Skin is warm and dry. No rashes noted. Psychiatric: Normal mood and affect. Behavior is normal.   ASSESSMENT AND PLAN: 76 y/o female with history of Parkinson's with pacemaker in place for bradycardia, presenting with acute lower abdominal pain which was then followed by the development of a few episodes of bloody diarrhea. CT scan with long segment of significant ileal thickening. Her course has been complicated by development of troponin elevation, with echocardiogram showing regional wall motion abnormalities with EF of 35%, pending cardiac cath by cardiology.    Differential for this presentation includes ischemic bowel or infectious enteritis, with Crohns, vasculitis, etc and other more rare pathology being much less likely. I reviewed CT scan with Dr. [place] Pink of radiology this afternoon. The arterial vasculature for the bowel appears normal, no calcifications or significant abnormality. However he raised the possibility for the presence of an internal hernia in the right lower quadrant, and it's possible she had a transient ischemic bowel from an internal hernia, if present.   Overall, she does not appear to have significant bleeding, last bowel movement was brown stool. I suspect her bleeding is coming from small bowel pathology noted on CT. Colonoscopy 2015 was normal.   Recommend: - continue liquid diet as tolerated - repeat CBC now, ensure no change in Hgb, trend leukocytosis - discussed options with radiology, they recommend interval imaging with CT enterography to re-evaluate small bowel, assess for internal hernia. Recommend we do this tomorrow to allow additional time for healing in between imaging - if patient has significant bleeding in the interim please contact me  Please call with  questions.  [place] Cellar, MD [facility] Gastroenterology Pager [phone]

## 2016-08-01 ENCOUNTER — Inpatient Hospital Stay (HOSPITAL_COMMUNITY): Payer: Medicare Other

## 2016-08-01 DIAGNOSIS — K921 Melena: Secondary | ICD-10-CM | POA: Diagnosis present

## 2016-08-01 LAB — CBC
HCT: 33.9 % — ABNORMAL LOW (ref 36.0–46.0)
Hemoglobin: 11.4 g/dL — ABNORMAL LOW (ref 12.0–15.0)
MCH: 32.8 pg (ref 26.0–34.0)
MCHC: 33.6 g/dL (ref 30.0–36.0)
MCV: 97.4 fL (ref 78.0–100.0)
PLATELETS: 99 10*3/uL — AB (ref 150–400)
RBC: 3.48 MIL/uL — ABNORMAL LOW (ref 3.87–5.11)
RDW: 12.7 % (ref 11.5–15.5)
WBC: 12.8 10*3/uL — AB (ref 4.0–10.5)

## 2016-08-01 LAB — BASIC METABOLIC PANEL
Anion gap: 6 (ref 5–15)
BUN: 7 mg/dL (ref 6–20)
CHLORIDE: 100 mmol/L — AB (ref 101–111)
CO2: 25 mmol/L (ref 22–32)
CREATININE: 0.74 mg/dL (ref 0.44–1.00)
Calcium: 8.7 mg/dL — ABNORMAL LOW (ref 8.9–10.3)
GFR calc Af Amer: >60 mL/min (ref >60)
GFR calc non Af Amer: >60 mL/min (ref >60)
Glucose, Bld: 95 mg/dL (ref 65–99)
Potassium: 3.5 mmol/L (ref 3.5–5.1)
SODIUM: 131 mmol/L — AB (ref 135–145)

## 2016-08-01 LAB — MAGNESIUM: Magnesium: 1.8 mg/dL (ref 1.7–2.4)

## 2016-08-01 MED ORDER — BARIUM SULFATE 0.1 % PO SUSP
ORAL | Status: AC
  Filled 2016-08-01: qty 3

## 2016-08-01 MED ORDER — IOPAMIDOL (ISOVUE-300) INJECTION 61%
INTRAVENOUS | Status: AC
  Administered 2016-08-01: 100 mL
  Filled 2016-08-01: qty 100

## 2016-08-01 NOTE — Progress Notes (Signed)
DAILY PROGRESS NOTE  Subjective:  Stools are improving. Plan for CT enterography today. I have again recommended LHC with newly reduced LVEF and focal WMA's, but she is hesitant to proceed.  Objective:  Temp:  [97.5 F (36.4 C)-98 F (36.7 C)] 97.9 F (36.6 C) (03/11 1148) Pulse Rate:  [71-96] 71 (03/11 1148) Resp:  [16-18] 16 (03/11 1148) BP: (109-113)/(60-70) 110/70 (03/11 1148) SpO2:  [92 %-100 %] 97 % (03/11 1148) Weight:  [146 lb 8 oz (66.5 kg)-146 lb 9.7 oz (66.5 kg)] 146 lb 9.7 oz (66.5 kg) (03/11 0400) Weight change: 3.2 oz (0.091 kg)  Intake/Output from previous day: 03/10 0701 - 03/11 0700 In: 1060 [P.O.:960; IV Piggyback:100] Out: 0   Intake/Output from this shift: Total I/O In: 3 [I.V.:3] Out: -   Medications: No current facility-administered medications on file prior to encounter.    Current Outpatient Prescriptions on File Prior to Encounter  Medication Sig Dispense Refill  . aspirin EC 81 MG tablet Take 81 mg by mouth at bedtime.    . carbidopa-levodopa (SINEMET IR) 25-100 MG per tablet Take 2 tablets by mouth 4 (four) times daily.     . clidinium-chlordiazePOXIDE (LIBRAX) 5-2.5 MG per capsule Take 1 capsule by mouth 3 (three) times daily as needed. 60 capsule 0  . docusate sodium (COLACE) 100 MG capsule Take 100 mg by mouth daily.    Marland Kitchen estrogens, conjugated, (PREMARIN) 0.3 MG tablet Take 1 tablet (0.3 mg total) by mouth at bedtime. 90 tablet 3  . medroxyPROGESTERone (PROVERA) 2.5 MG tablet TAKE 1 TABLET (2.5 MG TOTAL) BY MOUTH DAILY. 90 tablet 3  . mirabegron ER (MYRBETRIQ) 25 MG TB24 tablet Take 1 tablet (25 mg total) by mouth daily. 90 tablet 1  . Omega-3 1000 MG CAPS Take 1 capsule by mouth daily.    . polyethylene glycol (MIRALAX / GLYCOLAX) packet Take 17 g by mouth daily as needed.      Physical Exam: General appearance: alert and no distress Neck: no carotid bruit and no JVD Lungs: clear to auscultation bilaterally Heart: regular rate and  rhythm, S1, S2 normal, no murmur, click, rub or gallop Abdomen: mild TTP, no rebound or guarding Extremities: extremities normal, atraumatic, no cyanosis or edema Pulses: 2+ and symmetric Skin: Skin color, texture, turgor normal. No rashes or lesions Neurologic: Grossly normal Psych: Pleasant  Lab Results: Results for orders placed or performed during the hospital encounter of 07/29/16 (from the past 48 hour(s))  T3, free     Status: None   Collection Time: 07/30/16  2:48 PM  Result Value Ref Range   T3, Free 2.0 2.0 - 4.4 pg/mL    Comment: (NOTE) Performed At: [facility] [address] [place], [place] [id number] Lindon Romp MD [id number]   Glucose, capillary     Status: Abnormal   Collection Time: 07/30/16  6:10 PM  Result Value Ref Range   Glucose-Capillary 132 (H) 65 - 99 mg/dL  Glucose, capillary     Status: Abnormal   Collection Time: 07/30/16  8:03 PM  Result Value Ref Range   Glucose-Capillary 125 (H) 65 - 99 mg/dL  Glucose, capillary     Status: Abnormal   Collection Time: 07/31/16 12:01 AM  Result Value Ref Range   Glucose-Capillary 143 (H) 65 - 99 mg/dL  Glucose, capillary     Status: Abnormal   Collection Time: 07/31/16  3:58 AM  Result Value Ref Range   Glucose-Capillary 102 (H) 65 - 99 mg/dL  Glucose, capillary     Status: None   Collection Time: 07/31/16  7:38 AM  Result Value Ref Range   Glucose-Capillary 87 65 - 99 mg/dL   Comment 1 Notify RN   Glucose, capillary     Status: Abnormal   Collection Time: 07/31/16 12:22 PM  Result Value Ref Range   Glucose-Capillary 105 (H) 65 - 99 mg/dL  Basic metabolic panel     Status: Abnormal   Collection Time: 07/31/16  4:14 PM  Result Value Ref Range   Sodium 129 (L) 135 - 145 mmol/L    Comment: DELTA CHECK NOTED   Potassium 3.7 3.5 - 5.1 mmol/L   Chloride 101 101 - 111 mmol/L   CO2 22 22 - 32 mmol/L   Glucose, Bld 113 (H) 65 - 99 mg/dL   BUN 7 6 - 20 mg/dL   Creatinine, Ser 0.78 0.44 - 1.00  mg/dL   Calcium 8.5 (L) 8.9 - 10.3 mg/dL   GFR calc non Af Amer >60 >60 mL/min   GFR calc Af Amer >60 >60 mL/min    Comment: (NOTE) The eGFR has been calculated using the CKD EPI equation. This calculation has not been validated in all clinical situations. eGFR's persistently <60 mL/min signify possible Chronic Kidney Disease.    Anion gap 6 5 - 15  CBC     Status: Abnormal   Collection Time: 07/31/16  4:14 PM  Result Value Ref Range   WBC 16.1 (H) 4.0 - 10.5 K/uL   RBC 3.58 (L) 3.87 - 5.11 MIL/uL   Hemoglobin 11.7 (L) 12.0 - 15.0 g/dL   HCT 35.3 (L) 36.0 - 46.0 %   MCV 98.6 78.0 - 100.0 fL   MCH 32.7 26.0 - 34.0 pg   MCHC 33.1 30.0 - 36.0 g/dL   RDW 12.9 11.5 - 15.5 %   Platelets 102 (L) 150 - 400 K/uL    Comment: REPEATED TO VERIFY PLATELET COUNT CONFIRMED BY SMEAR   Magnesium     Status: None   Collection Time: 07/31/16  4:14 PM  Result Value Ref Range   Magnesium 1.8 1.7 - 2.4 mg/dL  Basic metabolic panel     Status: Abnormal   Collection Time: 08/01/16  6:58 AM  Result Value Ref Range   Sodium 131 (L) 135 - 145 mmol/L   Potassium 3.5 3.5 - 5.1 mmol/L   Chloride 100 (L) 101 - 111 mmol/L   CO2 25 22 - 32 mmol/L   Glucose, Bld 95 65 - 99 mg/dL   BUN 7 6 - 20 mg/dL   Creatinine, Ser 0.74 0.44 - 1.00 mg/dL   Calcium 8.7 (L) 8.9 - 10.3 mg/dL   GFR calc non Af Amer >60 >60 mL/min   GFR calc Af Amer >60 >60 mL/min    Comment: (NOTE) The eGFR has been calculated using the CKD EPI equation. This calculation has not been validated in all clinical situations. eGFR's persistently <60 mL/min signify possible Chronic Kidney Disease.    Anion gap 6 5 - 15  CBC     Status: Abnormal   Collection Time: 08/01/16  6:58 AM  Result Value Ref Range   WBC 12.8 (H) 4.0 - 10.5 K/uL   RBC 3.48 (L) 3.87 - 5.11 MIL/uL   Hemoglobin 11.4 (L) 12.0 - 15.0 g/dL   HCT 33.9 (L) 36.0 - 46.0 %   MCV 97.4 78.0 - 100.0 fL   MCH 32.8 26.0 - 34.0 pg   MCHC 33.6 30.0 -  36.0 g/dL   RDW 12.7 11.5  - 15.5 %   Platelets 99 (L) 150 - 400 K/uL    Comment: CONSISTENT WITH PREVIOUS RESULT  Magnesium     Status: None   Collection Time: 08/01/16  6:58 AM  Result Value Ref Range   Magnesium 1.8 1.7 - 2.4 mg/dL    Imaging: No results found.  Assessment:  Principal Problem:   Abdominal pain Active Problems:   PARKINSON'S   Pacemaker-Medtronic   Elevated lactic acid level   Elevated troponin   Hypokalemia   Enteritis   Hyperglycemia   Elevated TSH   Lactic acidosis   NSTEMI (non-ST elevated myocardial infarction) (HCC)   Acute systolic heart failure (HCC)   Plan:  Newly reduced LVEF to 35-40% with regional wall motion abnormalities and NSTEMI on cath. Await CT enterography today. She is "considering" whether she wants a cath or not. Will d/w her further tomorrow.  Time Spent Directly with Patient:  15 minutes  Length of Stay:  LOS: 3 days   Pixie Casino, MD, [facility] Attending Cardiologist [place] 08/01/2016, 2:05 PM

## 2016-08-01 NOTE — Progress Notes (Signed)
      Progress Note   Subjective  Patient without much abdominal pain but having some nausea. She reports a few more episodes of diarrhea with blood in it. Had another bowel movement this AM which she showed me and it appeared brown. She denies chest pains / shortness of breath   Objective   Vital signs in last 24 hours: Temp:  [97.5 F (36.4 C)-98 F (36.7 C)] 97.5 F (36.4 C) (03/11 0518) Pulse Rate:  [70-96] 96 (03/11 0518) Resp:  [15-18] 16 (03/11 0518) BP: (109-113)/(56-70) 113/63 (03/11 0518) SpO2:  [92 %-100 %] 92 % (03/11 0518) Weight:  [146 lb 8 oz (66.5 kg)-146 lb 9.7 oz (66.5 kg)] 146 lb 9.7 oz (66.5 kg) (03/11 0400) Last BM Date: 07/31/16 General:    white female in NAD Heart:  Regular rate and rhythm; no murmurs Lungs: Respirations even and unlabored, lungs CTA bilaterally Abdomen:  Soft, nontender and nondistended. . Extremities:  Without edema. Neurologic:  Alert and oriented,  grossly normal neurologically. Psych:  Cooperative. Normal mood and affect.  Intake/Output from previous day: 03/10 0701 - 03/11 0700 In: 1060 [P.O.:960; IV Piggyback:100] Out: 0  Intake/Output this shift: No intake/output data recorded.  Lab Results:  Recent Labs  07/30/16 0334 07/31/16 1614 08/01/16 0658  WBC 17.7* 16.1* 12.8*  HGB 12.8 11.7* 11.4*  HCT 39.4 35.3* 33.9*  PLT 121* 102* 99*   BMET  Recent Labs  07/30/16 0334 07/31/16 1614 08/01/16 0658  NA 138 129* 131*  K 4.4 3.7 3.5  CL 114* 101 100*  CO2 18* 22 25  GLUCOSE 122* 113* 95  BUN 10 7 7   CREATININE 0.74 0.78 0.74  CALCIUM 7.6* 8.5* 8.7*   LFT  Recent Labs  07/29/16 0731  PROT 6.6  ALBUMIN 3.8  AST 22  ALT 12*  ALKPHOS 41  BILITOT 0.6   PT/INR  Recent Labs  07/29/16 1400  LABPROT 13.8  INR 1.05    Studies/Results: No results found.     Assessment / Plan:   76 y/o female with history of Parkinson's, presenting with acute lower abdominal pain which was then followed by the  development of a few episodes of bloody diarrhea. Initial CT scan with long segment of significant ileal thickening. Her course has been complicated by development of troponin elevation, with echocardiogram showing regional wall motion abnormalities with EF of 35%, pending cardiac cath by cardiology.    Differential for this presentation includes ischemic bowel or infectious enteritis, with Crohns, vasculitis, etc and other more rare pathology being much less likely. GI pathogen panel negative. CT scan reviewed yesterday with radiology. The arterial vasculature for the bowel appears normal without occlusive changes. However they raised the possibility for the presence of an internal hernia in the right lower quadrant, and if this was present it's possible she had a transient ischemic bowel from an internal hernia.  WBC has downtrended, Hgb mildly decreased. I suspect her bleeding / diarrhea is coming from small bowel pathology noted on CT. Colonoscopy 2015 was normal.   Recommend: - CT enterography today, per discussion with radiology yesterday - continue standing dose antiemetics - pending CT results and her course, may need to consider colonoscopy as well  Please call with questions in the interim.   Sorento Cellar, MD [facility] Gastroenterology Pager [phone]

## 2016-08-01 NOTE — Progress Notes (Signed)
PROGRESS NOTE    Lindsey Bryant  WUJ:[id number] DOB: 1941/05/24 DOA: 07/29/2016 PCP: [name], MD   Brief Narrative:  76 year old female with history of Parkinson's disease, IBS, complete heart block s/p pacemaker who came to the ED with complaints of abrupt onset lower abdominal pain, nausea, and vomiting. CT abdomen/pelvis showed small bowel wall thickening and decreased enhancement without SBO or colonic abnormality. IV fluids and zosyn were given, surgery was consulted and TRH admitted to the SDU. General surgery suspected gastroenteritis, doubtful of mesenteric ischemia, and recommended bowel rest and IV hydration. With advancing diet, she had nausea alone until 3/10 when she had 2 episodes of nonbloody emesis and several episodes of blood in loose stools. So far today she has been able to eat clear and full liquids. Due to bleeding, GI has been consulted. Troponin elevation was noted on initial work up prompting cardiology evaluation and echocardiogram. The patient denies chest pain, dyspnea, and has an active lifestyle. Surprisingly, the echo showed EF 35-40% (previously 50-55% without RWMA in 2014) with mid-apical anterior/inferior akinesis. Clinically she has improved with resolution of abdominal pain. GI pathogen panel is pending.   Assessment & Plan:   Principal Problem:   Abdominal pain Active Problems:   PARKINSON'S   Pacemaker-[employer]   Elevated lactic acid level   Elevated troponin   Hypokalemia   Enteritis   Hyperglycemia   Elevated TSH   Lactic acidosis   NSTEMI (non-ST elevated myocardial infarction) (HCC)   Acute systolic heart failure (HCC)  Abdominal pain, N/V: Enteritis vs. mesenteric ischemia vs. internal hernia thought to be most likely possibilities. Lactate trended downward and exam is reassuring that any ischmia is not ongoing, surgery has signed off. WBC trending down as of 3/11. Zosyn discontinued once GI pathogen panel from 3/8 returned negative. - GI  consulted due to bloody diarrhea: CT enterography 3/11 to evaluate small bowel.  - Continue liquid diet, SLIV - Antiemetics as needed  GI bleeding: +FOBT, surgery does not believe this is surgical, recommended GI consultation. Has had colonoscopy by Dr. Henrene Pastor in 2015 with a single diminutive rectal polyp which was cold snared. - Consult [facility], Dr. Havery Moros: Plan for CT enterography. Possible need for further evaluation based on findings.  - Monitoring CBC  New onset systolic dysfunction with NSTEMI: Echo checked for elevated troponin, showed EF 35-40% (previously 50-55% without RWMA in 2014) with mid-apical anterior/inferior akinesis. Also showed severe LA dilation. Had reported VTach per EMS, since resolved, thought to have been artifact.  - Appears euvolemic - Cardiology following: Planning LHC/RHC next week pending work up, holding heparin. - Lipitor 80mg  started. - Aspirin 81mg  started, cautious with GI bleeding. - May need pacemaker interrogated. Will defer to cardiology.  Hypokalemia: Resolved. - Monitor with GI losses  Parkinson's disease: Chronic, stable - Continue home medications: sinemet  Hyperglycemia: Resolved, CBGs stable. HbA1c 5.3%.  - DC CBG's and SSI.  DVT prophylaxis:  SCDs Code Status:  Full code Family Communication:  Daughter and husband present at bedside. Disposition Plan: Ongoing work up.   Consultants:   General surgery, Dr. Georgette Dover  Cardiology, Dr. Acie Fredrickson  Gastroenterology, Dr. Havery Moros  Procedures:   None  Antimicrobials:   Zosyn 3/8 > 3/10  Subjective: Abdominal pain remains resolved, still with nausea without emesis. Tired of liquid diet. Had 2 episodes of watery stools with blood, decreasing in frequency.  Continues to deny any chest pain, dyspnea, palpitations, orthopnea, leg swelling.   Objective: Vitals:   07/31/16 2048 08/01/16 0400  08/01/16 0518 08/01/16 1148  BP: 109/70  113/63 110/70  Pulse: 96  96 71  Resp: 18  16 16     Temp: 97.9 F (36.6 C)  97.5 F (36.4 C) 97.9 F (36.6 C)  TempSrc: Oral  Oral Oral  SpO2:   92% 97%  Weight: 66.5 kg (146 lb 8 oz) 66.5 kg (146 lb 9.7 oz)    Height:        Intake/Output Summary (Last 24 hours) at 08/01/16 1418 Last data filed at 08/01/16 1000  Gross per 24 hour  Intake              293 ml  Output                0 ml  Net              293 ml   Filed Weights   07/30/16 2048 07/31/16 2048 08/01/16 0400  Weight: 66.4 kg (146 lb 4.8 oz) 66.5 kg (146 lb 8 oz) 66.5 kg (146 lb 9.7 oz)    Examination: General exam: 76yo M in no distress sitting in chair Respiratory system: Clear to auscultation. Respiratory effort normal. Cardiovascular system: S1 & S2 heard, RRR. No JVD, murmurs, rubs, gallops or clicks. No pedal edema. Gastrointestinal system: Abdomen is nondistended, soft and nontender. No organomegaly or masses felt. Normal bowel sounds heard. Central nervous system: Alert and oriented. No focal neurological deficits. Intention tremor and bradykinesia noted. Extremities: Warm, no deformities Skin: No rashes, lesions or ulcers Psychiatry: Judgement and insight appear normal. Mood euthymic with full affect   Data Reviewed:   CBC:  Recent Labs Lab 07/29/16 0731 07/30/16 0334 07/31/16 1614 08/01/16 0658  WBC 20.9* 17.7* 16.1* 12.8*  NEUTROABS 16.7*  --   --   --   HGB 14.0 12.8 11.7* 11.4*  HCT 42.1 39.4 35.3* 33.9*  MCV 99.5 99.0 98.6 97.4  PLT 162 121* 102* 99*   Basic Metabolic Panel:  Recent Labs Lab 07/29/16 0731 07/30/16 0334 07/31/16 1614 08/01/16 0658  NA 138 138 129* 131*  K 3.2* 4.4 3.7 3.5  CL 105 114* 101 100*  CO2 22 18* 22 25  GLUCOSE 231* 122* 113* 95  BUN 12 10 7 7   CREATININE 0.86 0.74 0.78 0.74  CALCIUM 9.0 7.6* 8.5* 8.7*  MG  --   --  1.8 1.8   GFR: Estimated Creatinine Clearance: 55.2 mL/min (by C-G formula based on SCr of 0.74 mg/dL). Liver Function Tests:  Recent Labs Lab 07/29/16 0731  AST 22  ALT 12*   ALKPHOS 41  BILITOT 0.6  PROT 6.6  ALBUMIN 3.8    Recent Labs Lab 07/29/16 0731  LIPASE 27   No results for input(s): AMMONIA in the last 168 hours. Coagulation Profile:  Recent Labs Lab 07/29/16 1400  INR 1.05   Cardiac Enzymes:  Recent Labs Lab 07/29/16 1400 07/29/16 1920  TROPONINI 1.95* 1.58*   BNP (last 3 results) No results for input(s): PROBNP in the last 8760 hours. HbA1C: No results for input(s): HGBA1C in the last 72 hours. CBG:  Recent Labs Lab 07/30/16 2003 07/31/16 0001 07/31/16 0358 07/31/16 0738 07/31/16 1222  GLUCAP 125* 143* 102* 87 105*   Lipid Profile: No results for input(s): CHOL, HDL, LDLCALC, TRIG, CHOLHDL, LDLDIRECT in the last 72 hours. Thyroid Function Tests:  Recent Labs  07/30/16 1448  T3FREE 2.0   Anemia Panel: No results for input(s): VITAMINB12, FOLATE, FERRITIN, TIBC, IRON, RETICCTPCT in the last  72 hours. Sepsis Labs:  Recent Labs Lab 07/29/16 1212 07/29/16 1920 07/29/16 2215 07/30/16 0334  LATICACIDVEN 4.2* 4.2* 3.9* 2.0*    Recent Results (from the past 240 hour(s))  Blood culture (routine x 2)     Status: None (Preliminary result)   Collection Time: 07/29/16  7:32 AM  Result Value Ref Range Status   Specimen Description BLOOD LEFT ANTECUBITAL  Final   Special Requests BOTTLES DRAWN AEROBIC AND ANAEROBIC  5CC  Final   Culture NO GROWTH 2 DAYS  Final   Report Status PENDING  Incomplete  Blood culture (routine x 2)     Status: None (Preliminary result)   Collection Time: 07/29/16  8:34 AM  Result Value Ref Range Status   Specimen Description BLOOD RIGHT HAND  Final   Special Requests BOTTLES DRAWN AEROBIC ONLY  3CC  Final   Culture NO GROWTH 2 DAYS  Final   Report Status PENDING  Incomplete  MRSA PCR Screening     Status: None   Collection Time: 07/29/16  2:28 PM  Result Value Ref Range Status   MRSA by PCR NEGATIVE NEGATIVE Final    Comment:        The GeneXpert MRSA Assay (FDA approved for NASAL  specimens only), is one component of a comprehensive MRSA colonization surveillance program. It is not intended to diagnose MRSA infection nor to guide or monitor treatment for MRSA infections.   Gastrointestinal Panel by PCR , Stool     Status: None   Collection Time: 07/29/16  4:52 PM  Result Value Ref Range Status   Campylobacter species NOT DETECTED NOT DETECTED Final   Plesimonas shigelloides NOT DETECTED NOT DETECTED Final   Salmonella species NOT DETECTED NOT DETECTED Final   Yersinia enterocolitica NOT DETECTED NOT DETECTED Final   Vibrio species NOT DETECTED NOT DETECTED Final   Vibrio cholerae NOT DETECTED NOT DETECTED Final   Enteroaggregative E coli (EAEC) NOT DETECTED NOT DETECTED Final   Enteropathogenic E coli (EPEC) NOT DETECTED NOT DETECTED Final   Enterotoxigenic E coli (ETEC) NOT DETECTED NOT DETECTED Final   Shiga like toxin producing E coli (STEC) NOT DETECTED NOT DETECTED Final   Shigella/Enteroinvasive E coli (EIEC) NOT DETECTED NOT DETECTED Final   Cryptosporidium NOT DETECTED NOT DETECTED Final   Cyclospora cayetanensis NOT DETECTED NOT DETECTED Final   Entamoeba histolytica NOT DETECTED NOT DETECTED Final   Giardia lamblia NOT DETECTED NOT DETECTED Final   Adenovirus F40/41 NOT DETECTED NOT DETECTED Final   Astrovirus NOT DETECTED NOT DETECTED Final   Norovirus GI/GII NOT DETECTED NOT DETECTED Final   Rotavirus A NOT DETECTED NOT DETECTED Final   Sapovirus (I, II, IV, and V) NOT DETECTED NOT DETECTED Final    Radiology Studies: No results found.  Scheduled Meds: . aspirin  81 mg Oral Daily  . atorvastatin  80 mg Oral q1800  . calcium carbonate  1 tablet Oral BID WC  . carbidopa-levodopa  2 tablet Oral QID  . potassium chloride  40 mEq Oral Once  . sodium chloride flush  3 mL Intravenous Q12H    LOS: 3 days   Time spent: 25 mins   Vance Gather, MD [facility] Pager [phone]   If 7PM-7AM, please contact  night-coverage [email or web address] Password [email or web address] 08/01/2016, 2:18 PM

## 2016-08-02 ENCOUNTER — Encounter (HOSPITAL_COMMUNITY): Payer: Self-pay | Admitting: Internal Medicine

## 2016-08-02 DIAGNOSIS — K921 Melena: Secondary | ICD-10-CM

## 2016-08-02 DIAGNOSIS — R1013 Epigastric pain: Secondary | ICD-10-CM

## 2016-08-02 DIAGNOSIS — K450 Other specified abdominal hernia with obstruction, without gangrene: Secondary | ICD-10-CM

## 2016-08-02 LAB — CBC
HEMATOCRIT: 32.6 % — AB (ref 36.0–46.0)
Hemoglobin: 11 g/dL — ABNORMAL LOW (ref 12.0–15.0)
MCH: 33.2 pg (ref 26.0–34.0)
MCHC: 33.7 g/dL (ref 30.0–36.0)
MCV: 98.5 fL (ref 78.0–100.0)
Platelets: 101 10*3/uL — ABNORMAL LOW (ref 150–400)
RBC: 3.31 MIL/uL — ABNORMAL LOW (ref 3.87–5.11)
RDW: 13 % (ref 11.5–15.5)
WBC: 9.7 10*3/uL (ref 4.0–10.5)

## 2016-08-02 LAB — MAGNESIUM: Magnesium: 1.9 mg/dL (ref 1.7–2.4)

## 2016-08-02 LAB — BASIC METABOLIC PANEL
ANION GAP: 9 (ref 5–15)
BUN: 7 mg/dL (ref 6–20)
CO2: 23 mmol/L (ref 22–32)
Calcium: 8.4 mg/dL — ABNORMAL LOW (ref 8.9–10.3)
Chloride: 103 mmol/L (ref 101–111)
Creatinine, Ser: 0.78 mg/dL (ref 0.44–1.00)
Glucose, Bld: 77 mg/dL (ref 65–99)
POTASSIUM: 3.5 mmol/L (ref 3.5–5.1)
SODIUM: 135 mmol/L (ref 135–145)

## 2016-08-02 MED ORDER — LOPERAMIDE HCL 2 MG PO CAPS
2.0000 mg | ORAL_CAPSULE | Freq: Once | ORAL | Status: AC
  Administered 2016-08-03: 2 mg via ORAL
  Filled 2016-08-02: qty 1

## 2016-08-02 MED ORDER — LISINOPRIL 5 MG PO TABS
5.0000 mg | ORAL_TABLET | Freq: Every day | ORAL | Status: DC
  Administered 2016-08-02 – 2016-08-04 (×2): 5 mg via ORAL
  Filled 2016-08-02 (×2): qty 1

## 2016-08-02 NOTE — Care Management Important Message (Signed)
Important Message  Patient Details  Name: Lindsey Bryant MRN: [id number] Date of Birth: 04-02-1941   Medicare Important Message Given:  Yes    Orbie Pyo 08/02/2016, 12:29 PM

## 2016-08-02 NOTE — Progress Notes (Signed)
Patient and family have consented to cath Have arranged for tomorrow am 2nd case 9:00 am  With Dr Ellyn Hack orders written

## 2016-08-02 NOTE — Progress Notes (Signed)
Subjective: No abd pain, tol diet, for cath tomorrow, cte with question of internal hernia and we were asked to see again.  Objective: Vital signs in last 24 hours: Temp:  [97.7 F (36.5 C)-98.6 F (37 C)] 98.6 F (37 C) (03/12 1000) Pulse Rate:  [72-87] 74 (03/12 1000) Resp:  [17-18] 18 (03/12 1000) BP: (109-126)/(67-74) 126/74 (03/12 1000) SpO2:  [97 %-100 %] 100 % (03/12 1000) Weight:  [64.3 kg (141 lb 12.8 oz)] 64.3 kg (141 lb 12.8 oz) (03/12 0559) Last BM Date: 08/01/16  Intake/Output from previous day: 03/11 0701 - 03/12 0700 In: 6 [I.V.:6] Out: 4 [Urine:4] Intake/Output this shift: Total I/O In: 480 [P.O.:480] Out: 1 [Urine:1]  GI: well healed rlq scar, soft nontender nondistended  Lab Results:   Recent Labs  08/01/16 0658 08/02/16 0546  WBC 12.8* 9.7  HGB 11.4* 11.0*  HCT 33.9* 32.6*  PLT 99* 101*   BMET  Recent Labs  08/01/16 0658 08/02/16 0546  NA 131* 135  K 3.5 3.5  CL 100* 103  CO2 25 23  GLUCOSE 95 77  BUN 7 7  CREATININE 0.74 0.78  CALCIUM 8.7* 8.4*   PT/INR No results for input(s): LABPROT, INR in the last 72 hours. ABG No results for input(s): PHART, HCO3 in the last 72 hours.  Invalid input(s): PCO2, PO2  Studies/Results: Ct Entero Abd/pelvis W Contast  Result Date: 08/01/2016 CLINICAL DATA:  Abnormal ileum on previous CT. EXAM: CT ABDOMEN AND PELVIS WITH CONTRAST (ENTEROGRAPHY) TECHNIQUE: Multidetector CT of the abdomen and pelvis during bolus administration of intravenous contrast. Negative oral contrast was given. CONTRAST:  120mL ISOVUE-300 IOPAMIDOL (ISOVUE-300) INJECTION 61% COMPARISON:  07/29/2016 FINDINGS: Lower chest: Small bilateral pleural effusions, right greater than left. Compressive atelectasis in the lower lobes, right greater than left. Hepatobiliary: No focal abnormality within the liver parenchyma. There is no evidence for gallstones, gallbladder wall thickening, or pericholecystic fluid. No intrahepatic or  extrahepatic biliary dilation. Pancreas: No focal mass lesion. No dilatation of the main duct. No intraparenchymal cyst. No peripancreatic edema. Spleen: No splenomegaly. No focal mass lesion. Adrenals/Urinary Tract: No adrenal nodule or mass. Kidneys unremarkable. No evidence for hydroureter. The urinary bladder appears normal for the degree of distention. Stomach/Bowel: Small hiatal hernia noted. Stomach otherwise unremarkable without wall thickening or abnormal mural enhancement. Duodenum is distended but otherwise unremarkable. Jejunal loops are more distended than typically seen in the setting of CT enterography. There is associated interloop mesenteric fluid while the bowel wall in the jejunum appears thickened, assessment of the nondependent gas in the segments of bowel demonstrate normal wall thickness. The ileal loops in the right lower quadrant show evidence of mural enhancement without hyperenhancement. On today's exam, 2 discrete small bowel transitions zones are identified in the right lower quadrant. One of these is evident on axial image 40 of series 3. The second is seen on axial image 51 of series 3. These areas are also fairly well demonstrated on sagittal images (see 1 transition zone images 38-47 of series 7 in the of the transition zone is more anterior on images 32-38 of series 7). Colon is decompressed. Vascular/Lymphatic: There is abdominal aortic atherosclerosis without aneurysm. Portal vein is patent in superior mesenteric vein is patent. Celiac axis and SMA opacified normally. Reproductive: The uterus has normal CT imaging appearance. There is no adnexal mass. Other: Small to moderate volume ascites evident in the abdomen and pelvis. Musculoskeletal: Bone windows reveal no worrisome lytic or sclerotic osseous lesions. IMPRESSION: 1. Proximal  small bowel has become more distended in the interval measuring 3.8 cm in diameter. Interloop mesenteric fluid persists but does appear to have  decreased in the interval. There is more intraperitoneal free fluid at this time. The distal small bowel wall thickening seen on the previous study also appears improved. 2. On today's exam, 2 discrete small bowel transitions zones are identified in the right peritoneal cavity. Given the presence of 2 transition zones, closed loop obstruction and internal hernia remain distinct considerations. 3. Interval development of small bilateral pleural effusions and bibasilar atelectasis. Electronically Signed   By: Misty Stanley M.D.   On: 08/01/2016 21:43    Anti-infectives: Anti-infectives    Start     Dose/Rate Route Frequency Ordered Stop   07/29/16 1700  piperacillin-tazobactam (ZOSYN) IVPB 3.375 g  Status:  Discontinued     3.375 g 12.5 mL/hr over 240 Minutes Intravenous Every 8 hours 07/29/16 1133 07/31/16 1759   07/29/16 0830  piperacillin-tazobactam (ZOSYN) IVPB 3.375 g     3.375 g 100 mL/hr over 30 Minutes Intravenous  Once 07/29/16 0830 07/29/16 1009      Assessment/Plan: abd pain ?enteritis, ?internal hernia  Not sure if she has internal hernia. Only has appy before. Regardless symptoms she was here for have abated and she has no abd issues right now. Will follow but nothing else indicated at this time. Cath tomorrow  [facility] 08/02/2016

## 2016-08-02 NOTE — Consult Note (Signed)
[facility] CM Primary Care Navigator  08/02/2016  Lindsey Bryant [birth year] [id number]   Met with patient, husband Lindsey Bryant) and daughters Lindsey Bryant Lindsey Bryant and Lindsey Bryant) at the bedside to identify possible discharge needs. Patient reports having severe abdominal pain, nausea and vomiting that had led to this admission.  Patient endorses Lindsey Bryant with [facility]  as the primary care provider.    Patient shared using [facility] in [place] to obtain medications without any problem.   Patient reports managing her own medications at home using "pill box" system weekly.   She states being able to drive prior to admission. Either husband or patient drives to her doctors' appointments as reported.  Husband will be her primary caregiver at home.   Discharge plan is undetermined pending procedure per daughter. Patient's anticipated discharge is home with home health services per Inpatient case manager's note.   Patient and family voiced understanding to call primary care provider's office when she returns home, for a post discharge follow-up appointment within a week or sooner if needs arise. Patient letter (with PCP's contact number) was provided as a reminder.  Patient and family denies current needs or concerns at this time.  For additional questions please contact:  Lindsey Bryant, BSN, RN-BC [facility] PRIMARY CARE Navigator Cell: [phone]

## 2016-08-02 NOTE — Progress Notes (Signed)
Daily Rounding Note  08/02/2016, 9:51 AM  LOS: 4 days   SUBJECTIVE:   Chief complaint:  Nausea persists.  Many loose stools after drinking oral contrast yesterday, none bloody.  No abdominal pain.  Tolerating full liquids.     Feels much better.  No chest pain, no dyspnea or cough  OBJECTIVE:         Vital signs in last 24 hours:    Temp:  [97.7 F (36.5 C)-97.9 F (36.6 C)] 97.7 F (36.5 C) (03/12 0606) Pulse Rate:  [71-87] 87 (03/12 0606) Resp:  [16-17] 17 (03/12 0606) BP: (109-120)/(67-70) 120/67 (03/12 0606) SpO2:  [97 %-100 %] 100 % (03/12 0606) Weight:  [64.3 kg (141 lb 12.8 oz)] 64.3 kg (141 lb 12.8 oz) (03/12 0559) Last BM Date: 08/01/16 Filed Weights   08/01/16 0400 08/01/16 2220 08/02/16 0559  Weight: 66.5 kg (146 lb 9.7 oz) 64.3 kg (141 lb 12.8 oz) 64.3 kg (141 lb 12.8 oz)   General: looks well, better than expected   Heart: RRR Chest: clear bil.  No dyspnea Abdomen: soft, NT, active BS.  ND.  Extremities: no CCE Neuro/Psych:  Oriented x 3.  Fully alert.  No gross wekness or deficits.   Intake/Output from previous day: 03/11 0701 - 03/12 0700 In: 6 [I.V.:6] Out: 4 [Urine:4]   Lab Results:  Recent Labs  07/31/16 1614 08/01/16 0658 08/02/16 0546  WBC 16.1* 12.8* 9.7  HGB 11.7* 11.4* 11.0*  HCT 35.3* 33.9* 32.6*  PLT 102* 99* 101*   BMET  Recent Labs  07/31/16 1614 08/01/16 0658 08/02/16 0546  NA 129* 131* 135  K 3.7 3.5 3.5  CL 101 100* 103  CO2 22 25 23   GLUCOSE 113* 95 77  BUN 7 7 7   CREATININE 0.78 0.74 0.78  CALCIUM 8.5* 8.7* 8.4*    Studies/Results: reviewed images w/ patient Ct Entero Abd/pelvis W Contast  Result Date: 08/01/2016 CLINICAL DATA:  Abnormal ileum on previous CT. EXAM: CT ABDOMEN AND PELVIS WITH CONTRAST (ENTEROGRAPHY) TECHNIQUE: Multidetector CT of the abdomen and pelvis during bolus administration of intravenous contrast. Negative oral contrast was given.  CONTRAST:  184mL ISOVUE-300 IOPAMIDOL (ISOVUE-300) INJECTION 61% COMPARISON:  07/29/2016 FINDINGS: Lower chest: Small bilateral pleural effusions, right greater than left. Compressive atelectasis in the lower lobes, right greater than left. Hepatobiliary: No focal abnormality within the liver parenchyma. There is no evidence for gallstones, gallbladder wall thickening, or pericholecystic fluid. No intrahepatic or extrahepatic biliary dilation. Pancreas: No focal mass lesion. No dilatation of the main duct. No intraparenchymal cyst. No peripancreatic edema. Spleen: No splenomegaly. No focal mass lesion. Adrenals/Urinary Tract: No adrenal nodule or mass. Kidneys unremarkable. No evidence for hydroureter. The urinary bladder appears normal for the degree of distention. Stomach/Bowel: Small hiatal hernia noted. Stomach otherwise unremarkable without wall thickening or abnormal mural enhancement. Duodenum is distended but otherwise unremarkable. Jejunal loops are more distended than typically seen in the setting of CT enterography. There is associated interloop mesenteric fluid while the bowel wall in the jejunum appears thickened, assessment of the nondependent gas in the segments of bowel demonstrate normal wall thickness. The ileal loops in the right lower quadrant show evidence of mural enhancement without hyperenhancement. On today's exam, 2 discrete small bowel transitions zones are identified in the right lower quadrant. One of these is evident on axial image 40 of series 3. The second is seen on axial image 51 of series 3. These areas are  also fairly well demonstrated on sagittal images (see 1 transition zone images 38-47 of series 7 in the of the transition zone is more anterior on images 32-38 of series 7). Colon is decompressed. Vascular/Lymphatic: There is abdominal aortic atherosclerosis without aneurysm. Portal vein is patent in superior mesenteric vein is patent. Celiac axis and SMA opacified normally.  Reproductive: The uterus has normal CT imaging appearance. There is no adnexal mass. Other: Small to moderate volume ascites evident in the abdomen and pelvis. Musculoskeletal: Bone windows reveal no worrisome lytic or sclerotic osseous lesions. IMPRESSION: 1. Proximal small bowel has become more distended in the interval measuring 3.8 cm in diameter. Interloop mesenteric fluid persists but does appear to have decreased in the interval. There is more intraperitoneal free fluid at this time. The distal small bowel wall thickening seen on the previous study also appears improved. 2. On today's exam, 2 discrete small bowel transitions zones are identified in the right peritoneal cavity. Given the presence of 2 transition zones, closed loop obstruction and internal hernia remain distinct considerations. 3. Interval development of small bilateral pleural effusions and bibasilar atelectasis. Electronically Signed   By: Misty Stanley M.D.   On: 08/01/2016 21:43    ASSESMENT:   *  Bloody diarrhea (limited, resolved)and lower abd pain.  Ileitis per CT.  D Dx includes ischemic bowel (from internal hernia?), infectious enteritis, vasculitis, Given acute onset of sxs, IBD/Crohn's less likely GI pathogen panel negative.  CTA 3/11, c/w CT 3/8, shows increased distention and intraperitoneal fluid, decreased mesenteric fluid and  improving distal ileitis.  With 2 transition zones, closed loop obstruction and internal hernia remain distinct considerations. Clinically the pain and bloody diarrhea have resolved for ~> 24 hours.   Inpt onset nausea, which pt thinks may be due to taking her meds on mostly empty stomach. Her only abd/pelvic surgery was remote appendectomy Surgery signed off 3 days ago.      *  Elevated Troponins: 0.1, 1.9, 1.5.  EF 35% - 40% (LVEF 50 - 55%  in 2014).  Cardiology recommended LHC with newly reduced LVEF and focal WMA's.  Previous pacemaker for heart block  *  Thrombocytopenia.  transient  occurrences of same in 2009, 2010, 2013.    *  Parkinson's.     PLAN   *  Do we need surgery to re-evaluate in light of the ? Internal hernia?Marland Kitchen  Note Dr Bonner Puna spoke with Gen surg this morning so they will reevalulate.    *  Leave on full liquids for now.    *  ? Timing of cardiac cath, if pt agrees to this?    Azucena Freed  08/02/2016, 9:51 AM Pager: [phone]     [facility] Attending   I have taken an interval history, reviewed the chart and examined the patient. I agree with the Advanced Practitioner's note, impression and recommendations.   Internal hernia of small bowel seems to be a likely culprit based upon what we know so far. I have called [facility] and asked them to see her again and explained this to patient and husband.  Gatha Mayer, MD, [facility] [phone] (pager) [phone] after 5 PM, weekends and holidays  08/02/2016 2:52 PM

## 2016-08-02 NOTE — Progress Notes (Signed)
PROGRESS NOTE    Lindsey Bryant  BZJ:[id number] DOB: [birth year] DOA: 07/29/2016 PCP: [name], MD   Brief Narrative:  76 year old female with history of Parkinson's disease, IBS, complete heart block s/p pacemaker who came to the ED with complaints of abrupt onset lower abdominal pain, nausea, and vomiting. CT abdomen/pelvis showed small bowel wall thickening and decreased enhancement without SBO or colonic abnormality. IV fluids and zosyn were given, surgery was consulted and TRH admitted to the SDU. GI pathogen panel was negative. General surgery suspected gastroenteritis, doubtful of mesenteric ischemia, and recommended bowel rest and IV hydration. With advancing diet, she had nausea alone until 3/10 when she had 2 episodes of nonbloody emesis and several episodes of blood in loose stools. GI was consulted and after review of CT findings with radiology, felt concerned for internal hernia, possibly resolve mesenteric ischemia. CT enterography performed 3/11 showed increased dilation of proximal small bowel, decreased interloop mesenteric free fluid, increased intraperitoneal free fluid, and improved distal small bowel thickening. 2 small bowel transition zones were identified in the right peritoneal cavity, raising consideration of closed loop obstruction and internal hernia.  Troponin elevation was noted on initial work up prompting cardiology evaluation and echocardiogram. The patient denies chest pain, dyspnea, and has an active lifestyle. Surprisingly, the echo showed EF 35-40% (previously 50-55% without RWMA in 2014) with mid-apical anterior/inferior akinesis. Cardiology is planning LHC prior to discharge.  Assessment & Plan:   Principal Problem:   Abdominal pain Active Problems:   Parkinson's disease (HCC)   Pacemaker-[employer]   Elevated lactic acid level   Elevated troponin   Hypokalemia   Enteritis   Hyperglycemia   Elevated TSH   Lactic acidosis   NSTEMI (non-ST elevated  myocardial infarction) (HCC)   Acute systolic heart failure (HCC)   Hematochezia  Abdominal pain, N/V: Enteritis vs. mesenteric ischemia vs. internal hernia thought to be most likely possibilities. Lactate trended downward and exam is reassuring that any ischmia is not ongoing, surgery has signed off. Leukocytosis resolved on 3/12. Zosyn was discontinued once GI pathogen panel from 3/8 returned negative. - GI consulted due to bloody diarrhea: CT enterography 3/11 with evidence of small bowel increased dilation of proximal small bowel, decreased interloop mesenteric free fluid, increased intraperitoneal free fluid, and improved distal small bowel thickening. 2 small bowel transition zones were identified in the right peritoneal cavity, raising consideration of closed loop obstruction and internal hernia. - Will discuss and review yesterday's scan with general surgery.  - Continue liquid diet, SLIV - Antiemetics as needed  GI bleeding: +FOBT. General surgery signed off. Has had colonoscopy by Dr. Henrene Pastor in 2015 with a single diminutive rectal polyp which was cold snared. - Consulted [facility], Dr. Havery Moros: Plan for CT enterography. Possible need for further evaluation based on findings.  - Monitoring CBC, hgb trending slowly down, at 11 on 3/12.   New onset systolic dysfunction with NSTEMI: Echo checked for elevated troponin, showed EF 35-40% (previously 50-55% without RWMA in 2014) with mid-apical anterior/inferior akinesis. Also showed severe LA dilation. Had reported VTach per EMS, since resolved, thought to have been artifact. Appears euvolemic. - Cardiology following: Planning LHC prior to discharge, holding heparin. - Lipitor 80mg , aspirin 81mg , low dose lisinopril started, cautious with GI bleeding. - May need pacemaker interrogated. Will defer to cardiology.  Hypokalemia: Resolved. - Monitor with GI losses  Parkinson's disease: Chronic, stable - Continue home medications:  sinemet  Hyperglycemia: Resolved, CBGs stable. HbA1c 5.3%.  - DC CBG's and  SSI.  DVT prophylaxis:  SCDs Code Status:  Full code Family Communication:  Daughter and husband present at bedside. Disposition Plan: Ongoing work up.   Consultants:   General surgery, Dr. Georgette Dover  Cardiology, Dr. Acie Fredrickson  Gastroenterology, Dr. Havery Moros  Procedures:   None  Antimicrobials:   Zosyn 3/8 > 3/10  Subjective: Abdominal pain remains resolved, still with nausea without emesis. Having watery diarrhea with less blood. Continues to deny any chest pain, dyspnea, palpitations, orthopnea, leg swelling.   Objective: Vitals:   08/01/16 1148 08/01/16 2220 08/02/16 0559 08/02/16 0606  BP: 110/70 109/68  120/67  Pulse: 71 72  87  Resp: 16 17  17   Temp: 97.9 F (36.6 C) 97.7 F (36.5 C)  97.7 F (36.5 C)  TempSrc: Oral Oral  Oral  SpO2: 97% 97%  100%  Weight:  64.3 kg (141 lb 12.8 oz) 64.3 kg (141 lb 12.8 oz)   Height:        Intake/Output Summary (Last 24 hours) at 08/02/16 0816 Last data filed at 08/02/16 0603  Gross per 24 hour  Intake                6 ml  Output                4 ml  Net                2 ml   Filed Weights   08/01/16 0400 08/01/16 2220 08/02/16 0559  Weight: 66.5 kg (146 lb 9.7 oz) 64.3 kg (141 lb 12.8 oz) 64.3 kg (141 lb 12.8 oz)    Examination: General exam: 76yo M in no distress sitting in chair Respiratory system: Clear to auscultation. Respiratory effort normal. Cardiovascular system: S1 & S2 heard, RRR. No JVD, murmurs, rubs, gallops or clicks. No pedal edema. Gastrointestinal system: Abdomen is nondistended, soft and nontender. No organomegaly or masses felt. Normal bowel sounds heard. Central nervous system: Alert and oriented. No focal neurological deficits. Intention tremor and bradykinesia noted. Extremities: Warm, no deformities Skin: No rashes, lesions or ulcers Psychiatry: Judgement and insight appear normal. Mood euthymic with full affect    Data Reviewed:   CBC:  Recent Labs Lab 07/29/16 0731 07/30/16 0334 07/31/16 1614 08/01/16 0658 08/02/16 0546  WBC 20.9* 17.7* 16.1* 12.8* 9.7  NEUTROABS 16.7*  --   --   --   --   HGB 14.0 12.8 11.7* 11.4* 11.0*  HCT 42.1 39.4 35.3* 33.9* 32.6*  MCV 99.5 99.0 98.6 97.4 98.5  PLT 162 121* 102* 99* 510*   Basic Metabolic Panel:  Recent Labs Lab 07/29/16 0731 07/30/16 0334 07/31/16 1614 08/01/16 0658 08/02/16 0546  NA 138 138 129* 131* 135  K 3.2* 4.4 3.7 3.5 3.5  CL 105 114* 101 100* 103  CO2 22 18* 22 25 23   GLUCOSE 231* 122* 113* 95 77  BUN 12 10 7 7 7   CREATININE 0.86 0.74 0.78 0.74 0.78  CALCIUM 9.0 7.6* 8.5* 8.7* 8.4*  MG  --   --  1.8 1.8 1.9   GFR: Estimated Creatinine Clearance: 54.3 mL/min (by C-G formula based on SCr of 0.78 mg/dL). Liver Function Tests:  Recent Labs Lab 07/29/16 0731  AST 22  ALT 12*  ALKPHOS 41  BILITOT 0.6  PROT 6.6  ALBUMIN 3.8    Recent Labs Lab 07/29/16 0731  LIPASE 27   No results for input(s): AMMONIA in the last 168 hours. Coagulation Profile:  Recent Labs Lab 07/29/16 1400  INR 1.05   Cardiac Enzymes:  Recent Labs Lab 07/29/16 1400 07/29/16 1920  TROPONINI 1.95* 1.58*   BNP (last 3 results) No results for input(s): PROBNP in the last 8760 hours. HbA1C: No results for input(s): HGBA1C in the last 72 hours. CBG:  Recent Labs Lab 07/30/16 2003 07/31/16 0001 07/31/16 0358 07/31/16 0738 07/31/16 1222  GLUCAP 125* 143* 102* 87 105*   Lipid Profile: No results for input(s): CHOL, HDL, LDLCALC, TRIG, CHOLHDL, LDLDIRECT in the last 72 hours. Thyroid Function Tests:  Recent Labs  07/30/16 1448  T3FREE 2.0   Anemia Panel: No results for input(s): VITAMINB12, FOLATE, FERRITIN, TIBC, IRON, RETICCTPCT in the last 72 hours. Sepsis Labs:  Recent Labs Lab 07/29/16 1212 07/29/16 1920 07/29/16 2215 07/30/16 0334  LATICACIDVEN 4.2* 4.2* 3.9* 2.0*    Recent Results (from the past 240  hour(s))  Blood culture (routine x 2)     Status: None (Preliminary result)   Collection Time: 07/29/16  7:32 AM  Result Value Ref Range Status   Specimen Description BLOOD LEFT ANTECUBITAL  Final   Special Requests BOTTLES DRAWN AEROBIC AND ANAEROBIC  5CC  Final   Culture NO GROWTH 3 DAYS  Final   Report Status PENDING  Incomplete  Blood culture (routine x 2)     Status: None (Preliminary result)   Collection Time: 07/29/16  8:34 AM  Result Value Ref Range Status   Specimen Description BLOOD RIGHT HAND  Final   Special Requests BOTTLES DRAWN AEROBIC ONLY  3CC  Final   Culture NO GROWTH 3 DAYS  Final   Report Status PENDING  Incomplete  MRSA PCR Screening     Status: None   Collection Time: 07/29/16  2:28 PM  Result Value Ref Range Status   MRSA by PCR NEGATIVE NEGATIVE Final    Comment:        The GeneXpert MRSA Assay (FDA approved for NASAL specimens only), is one component of a comprehensive MRSA colonization surveillance program. It is not intended to diagnose MRSA infection nor to guide or monitor treatment for MRSA infections.   Gastrointestinal Panel by PCR , Stool     Status: None   Collection Time: 07/29/16  4:52 PM  Result Value Ref Range Status   Campylobacter species NOT DETECTED NOT DETECTED Final   Plesimonas shigelloides NOT DETECTED NOT DETECTED Final   Salmonella species NOT DETECTED NOT DETECTED Final   Yersinia enterocolitica NOT DETECTED NOT DETECTED Final   Vibrio species NOT DETECTED NOT DETECTED Final   Vibrio cholerae NOT DETECTED NOT DETECTED Final   Enteroaggregative E coli (EAEC) NOT DETECTED NOT DETECTED Final   Enteropathogenic E coli (EPEC) NOT DETECTED NOT DETECTED Final   Enterotoxigenic E coli (ETEC) NOT DETECTED NOT DETECTED Final   Shiga like toxin producing E coli (STEC) NOT DETECTED NOT DETECTED Final   Shigella/Enteroinvasive E coli (EIEC) NOT DETECTED NOT DETECTED Final   Cryptosporidium NOT DETECTED NOT DETECTED Final   Cyclospora  cayetanensis NOT DETECTED NOT DETECTED Final   Entamoeba histolytica NOT DETECTED NOT DETECTED Final   Giardia lamblia NOT DETECTED NOT DETECTED Final   Adenovirus F40/41 NOT DETECTED NOT DETECTED Final   Astrovirus NOT DETECTED NOT DETECTED Final   Norovirus GI/GII NOT DETECTED NOT DETECTED Final   Rotavirus A NOT DETECTED NOT DETECTED Final   Sapovirus (I, II, IV, and V) NOT DETECTED NOT DETECTED Final    Radiology Studies: Ct Entero Abd/pelvis W Contast  Result Date: 08/01/2016 CLINICAL DATA:  Abnormal ileum on previous CT. EXAM: CT ABDOMEN AND PELVIS WITH CONTRAST (ENTEROGRAPHY) TECHNIQUE: Multidetector CT of the abdomen and pelvis during bolus administration of intravenous contrast. Negative oral contrast was given. CONTRAST:  131mL ISOVUE-300 IOPAMIDOL (ISOVUE-300) INJECTION 61% COMPARISON:  07/29/2016 FINDINGS: Lower chest: Small bilateral pleural effusions, right greater than left. Compressive atelectasis in the lower lobes, right greater than left. Hepatobiliary: No focal abnormality within the liver parenchyma. There is no evidence for gallstones, gallbladder wall thickening, or pericholecystic fluid. No intrahepatic or extrahepatic biliary dilation. Pancreas: No focal mass lesion. No dilatation of the main duct. No intraparenchymal cyst. No peripancreatic edema. Spleen: No splenomegaly. No focal mass lesion. Adrenals/Urinary Tract: No adrenal nodule or mass. Kidneys unremarkable. No evidence for hydroureter. The urinary bladder appears normal for the degree of distention. Stomach/Bowel: Small hiatal hernia noted. Stomach otherwise unremarkable without wall thickening or abnormal mural enhancement. Duodenum is distended but otherwise unremarkable. Jejunal loops are more distended than typically seen in the setting of CT enterography. There is associated interloop mesenteric fluid while the bowel wall in the jejunum appears thickened, assessment of the nondependent gas in the segments of bowel  demonstrate normal wall thickness. The ileal loops in the right lower quadrant show evidence of mural enhancement without hyperenhancement. On today's exam, 2 discrete small bowel transitions zones are identified in the right lower quadrant. One of these is evident on axial image 40 of series 3. The second is seen on axial image 51 of series 3. These areas are also fairly well demonstrated on sagittal images (see 1 transition zone images 38-47 of series 7 in the of the transition zone is more anterior on images 32-38 of series 7). Colon is decompressed. Vascular/Lymphatic: There is abdominal aortic atherosclerosis without aneurysm. Portal vein is patent in superior mesenteric vein is patent. Celiac axis and SMA opacified normally. Reproductive: The uterus has normal CT imaging appearance. There is no adnexal mass. Other: Small to moderate volume ascites evident in the abdomen and pelvis. Musculoskeletal: Bone windows reveal no worrisome lytic or sclerotic osseous lesions. IMPRESSION: 1. Proximal small bowel has become more distended in the interval measuring 3.8 cm in diameter. Interloop mesenteric fluid persists but does appear to have decreased in the interval. There is more intraperitoneal free fluid at this time. The distal small bowel wall thickening seen on the previous study also appears improved. 2. On today's exam, 2 discrete small bowel transitions zones are identified in the right peritoneal cavity. Given the presence of 2 transition zones, closed loop obstruction and internal hernia remain distinct considerations. 3. Interval development of small bilateral pleural effusions and bibasilar atelectasis. Electronically Signed   By: Misty Stanley M.D.   On: 08/01/2016 21:43    Scheduled Meds: . aspirin  81 mg Oral Daily  . atorvastatin  80 mg Oral q1800  . calcium carbonate  1 tablet Oral BID WC  . carbidopa-levodopa  2 tablet Oral QID  . potassium chloride  40 mEq Oral Once  . sodium chloride flush   3 mL Intravenous Q12H    LOS: 4 days   Time spent: 25 mins   Vance Gather, MD [facility] Pager [phone]   If 7PM-7AM, please contact night-coverage [email or web address] Password [email or web address] 08/02/2016, 8:16 AM

## 2016-08-02 NOTE — Progress Notes (Signed)
Progress Note  Patient Name: Lindsey Bryant Date of Encounter: 08/02/2016  Primary Cardiologist: Klein/Nahser  Subjective   Still with watery diarrhea no blood no real abdominal pain. No chest pain   Inpatient Medications    Scheduled Meds: . aspirin  81 mg Oral Daily  . atorvastatin  80 mg Oral q1800  . calcium carbonate  1 tablet Oral BID WC  . carbidopa-levodopa  2 tablet Oral QID  . potassium chloride  40 mEq Oral Once  . sodium chloride flush  3 mL Intravenous Q12H   Continuous Infusions:  PRN Meds: acetaminophen **OR** acetaminophen, morphine injection, ondansetron **OR** ondansetron (ZOFRAN) IV, promethazine   Vital Signs    Vitals:   08/01/16 1148 08/01/16 2220 08/02/16 0559 08/02/16 0606  BP: 110/70 109/68  120/67  Pulse: 71 72  87  Resp: 16 17  17   Temp: 97.9 F (36.6 C) 97.7 F (36.5 C)  97.7 F (36.5 C)  TempSrc: Oral Oral  Oral  SpO2: 97% 97%  100%  Weight:  141 lb 12.8 oz (64.3 kg) 141 lb 12.8 oz (64.3 kg)   Height:        Intake/Output Summary (Last 24 hours) at 08/02/16 0819 Last data filed at 08/02/16 6269  Gross per 24 hour  Intake                6 ml  Output                4 ml  Net                2 ml   Filed Weights   08/01/16 0400 08/01/16 2220 08/02/16 0559  Weight: 146 lb 9.7 oz (66.5 kg) 141 lb 12.8 oz (64.3 kg) 141 lb 12.8 oz (64.3 kg)    Telemetry    NSR 08/02/2016  - Personally Reviewed  ECG    P synch pacing LBBB 07/29/16 - Personally Reviewed  Physical Exam  Affect appropriate Healthy:  appears stated age HEENT: normal Neck supple with no adenopathy JVP normal no bruits no thyromegaly Lungs clear with no wheezing and good diaphragmatic motion Heart:  S1/S2 no murmur, no rub, gallop or click Pacer under left clavicle  PMI normal Abdomen: benighn, BS positve, no tenderness, no AAA no bruit.  No HSM or HJR Distal pulses intact with no bruits No edema Neuro non-focal Skin warm and dry No muscular  weakness   Labs    Chemistry Recent Labs Lab 07/29/16 0731  07/31/16 1614 08/01/16 0658 08/02/16 0546  NA 138  < > 129* 131* 135  K 3.2*  < > 3.7 3.5 3.5  CL 105  < > 101 100* 103  CO2 22  < > 22 25 23   GLUCOSE 231*  < > 113* 95 77  BUN 12  < > 7 7 7   CREATININE 0.86  < > 0.78 0.74 0.78  CALCIUM 9.0  < > 8.5* 8.7* 8.4*  PROT 6.6  --   --   --   --   ALBUMIN 3.8  --   --   --   --   AST 22  --   --   --   --   ALT 12*  --   --   --   --   ALKPHOS 41  --   --   --   --   BILITOT 0.6  --   --   --   --  GFRNONAA >60  < > >60 >60 >60  GFRAA >60  < > >60 >60 >60  ANIONGAP 11  < > 6 6 9   < > = values in this interval not displayed.   Hematology  Recent Labs Lab 07/31/16 1614 08/01/16 0658 08/02/16 0546  WBC 16.1* 12.8* 9.7  RBC 3.58* 3.48* 3.31*  HGB 11.7* 11.4* 11.0*  HCT 35.3* 33.9* 32.6*  MCV 98.6 97.4 98.5  MCH 32.7 32.8 33.2  MCHC 33.1 33.6 33.7  RDW 12.9 12.7 13.0  PLT 102* 99* 101*    Cardiac Enzymes  Recent Labs Lab 07/29/16 1400 07/29/16 1920  TROPONINI 1.95* 1.58*     Recent Labs Lab 07/29/16 0755  TROPIPOC 0.10*      Radiology    Ct Entero Abd/pelvis W Contast  Result Date: 08/01/2016 CLINICAL DATA:  Abnormal ileum on previous CT. EXAM: CT ABDOMEN AND PELVIS WITH CONTRAST (ENTEROGRAPHY) TECHNIQUE: Multidetector CT of the abdomen and pelvis during bolus administration of intravenous contrast. Negative oral contrast was given. CONTRAST:  119mL ISOVUE-300 IOPAMIDOL (ISOVUE-300) INJECTION 61% COMPARISON:  07/29/2016 FINDINGS: Lower chest: Small bilateral pleural effusions, right greater than left. Compressive atelectasis in the lower lobes, right greater than left. Hepatobiliary: No focal abnormality within the liver parenchyma. There is no evidence for gallstones, gallbladder wall thickening, or pericholecystic fluid. No intrahepatic or extrahepatic biliary dilation. Pancreas: No focal mass lesion. No dilatation of the main duct. No  intraparenchymal cyst. No peripancreatic edema. Spleen: No splenomegaly. No focal mass lesion. Adrenals/Urinary Tract: No adrenal nodule or mass. Kidneys unremarkable. No evidence for hydroureter. The urinary bladder appears normal for the degree of distention. Stomach/Bowel: Small hiatal hernia noted. Stomach otherwise unremarkable without wall thickening or abnormal mural enhancement. Duodenum is distended but otherwise unremarkable. Jejunal loops are more distended than typically seen in the setting of CT enterography. There is associated interloop mesenteric fluid while the bowel wall in the jejunum appears thickened, assessment of the nondependent gas in the segments of bowel demonstrate normal wall thickness. The ileal loops in the right lower quadrant show evidence of mural enhancement without hyperenhancement. On today's exam, 2 discrete small bowel transitions zones are identified in the right lower quadrant. One of these is evident on axial image 40 of series 3. The second is seen on axial image 51 of series 3. These areas are also fairly well demonstrated on sagittal images (see 1 transition zone images 38-47 of series 7 in the of the transition zone is more anterior on images 32-38 of series 7). Colon is decompressed. Vascular/Lymphatic: There is abdominal aortic atherosclerosis without aneurysm. Portal vein is patent in superior mesenteric vein is patent. Celiac axis and SMA opacified normally. Reproductive: The uterus has normal CT imaging appearance. There is no adnexal mass. Other: Small to moderate volume ascites evident in the abdomen and pelvis. Musculoskeletal: Bone windows reveal no worrisome lytic or sclerotic osseous lesions. IMPRESSION: 1. Proximal small bowel has become more distended in the interval measuring 3.8 cm in diameter. Interloop mesenteric fluid persists but does appear to have decreased in the interval. There is more intraperitoneal free fluid at this time. The distal small bowel  wall thickening seen on the previous study also appears improved. 2. On today's exam, 2 discrete small bowel transitions zones are identified in the right peritoneal cavity. Given the presence of 2 transition zones, closed loop obstruction and internal hernia remain distinct considerations. 3. Interval development of small bilateral pleural effusions and bibasilar atelectasis. Electronically Signed   By:  Misty Stanley M.D.   On: 08/01/2016 21:43    Cardiac Studies   Echo 07/30/16 personally reviewed.  EF 35-40% with RWMA;s Akinesis of the mid-apical anterior and   inferior myocardium. Hypokinesis of the mid to apical   anteroseptal, inferoseptal,anterolateral , inferolateral   myocardium. Doppler   Patient Profile     76 y.o. female admitted with possible small bowel ischemia. Noted to have elevated Troponin and echo with new RWMA;s and decreased EF  Assessment & Plan    Troponin:  ECG not revealing due to pacing Troponin peak 1.95 07/29/16 No chest pain Diagnostic heart cath recommended. Timing depends on course of GI illness and need For possible abdominal surgery.   Low EF:  Clinically euvolemic start low dose ACE.   Pacer:  Normal function f/u Dr Caryl Comes  GI:  Clinically improved CT with possible internal small bowel hernia will await IM/GI decision Will need heart cath before any general anesthesia or electively before d/c if Rx medically  Parkinson's. :  Continue sinemet stable  Over 35 minutes spent face to face counseling patient and family on CHF, elevated enzymes and need For heart cath before d/c   Signed, Jenkins Rouge, MD  08/02/2016, 8:19 AM

## 2016-08-03 ENCOUNTER — Encounter: Payer: Medicare Other | Admitting: *Deleted

## 2016-08-03 ENCOUNTER — Encounter (HOSPITAL_COMMUNITY)
Admission: EM | Disposition: A | Discharge status: Home / Self Care | Payer: Self-pay | Source: Home / Self Care | Attending: Family Medicine

## 2016-08-03 ENCOUNTER — Telehealth: Payer: Self-pay | Admitting: Cardiology

## 2016-08-03 ENCOUNTER — Encounter (HOSPITAL_COMMUNITY): Payer: Self-pay | Admitting: Internal Medicine

## 2016-08-03 DIAGNOSIS — R933 Abnormal findings on diagnostic imaging of other parts of digestive tract: Secondary | ICD-10-CM

## 2016-08-03 DIAGNOSIS — I2510 Atherosclerotic heart disease of native coronary artery without angina pectoris: Secondary | ICD-10-CM

## 2016-08-03 DIAGNOSIS — R197 Diarrhea, unspecified: Secondary | ICD-10-CM

## 2016-08-03 DIAGNOSIS — G20 Parkinson's disease: Secondary | ICD-10-CM

## 2016-08-03 HISTORY — PX: LEFT HEART CATH AND CORONARY ANGIOGRAPHY: CATH118249

## 2016-08-03 LAB — SURGICAL PCR SCREEN
MRSA, PCR: NEGATIVE
Staphylococcus aureus: NEGATIVE

## 2016-08-03 LAB — CULTURE, BLOOD (ROUTINE X 2)
CULTURE: NO GROWTH
Culture: NO GROWTH

## 2016-08-03 SURGERY — LEFT HEART CATH AND CORONARY ANGIOGRAPHY

## 2016-08-03 MED ORDER — LIDOCAINE HCL (PF) 1 % IJ SOLN
INTRAMUSCULAR | Status: AC
  Filled 2016-08-03: qty 30

## 2016-08-03 MED ORDER — IOPAMIDOL (ISOVUE-370) INJECTION 76%
INTRAVENOUS | Status: DC | PRN
  Administered 2016-08-03: 70 mL via INTRA_ARTERIAL

## 2016-08-03 MED ORDER — SODIUM CHLORIDE 0.9 % WEIGHT BASED INFUSION: 1.0000 mL/kg/h | INTRAVENOUS | Status: DC

## 2016-08-03 MED ORDER — FENTANYL CITRATE (PF) 100 MCG/2ML IJ SOLN
INTRAMUSCULAR | Status: AC
  Filled 2016-08-03: qty 2

## 2016-08-03 MED ORDER — HEPARIN SODIUM (PORCINE) 5000 UNIT/ML IJ SOLN
5000.0000 [IU] | Freq: Three times a day (TID) | INTRAMUSCULAR | Status: DC
  Administered 2016-08-03 – 2016-08-04 (×2): 5000 [IU] via SUBCUTANEOUS
  Filled 2016-08-03 (×2): qty 1

## 2016-08-03 MED ORDER — SODIUM CHLORIDE 0.9% FLUSH: 3.0000 mL | INTRAVENOUS | Status: DC | PRN

## 2016-08-03 MED ORDER — MIDAZOLAM HCL 2 MG/2ML IJ SOLN
INTRAMUSCULAR | Status: DC | PRN
  Administered 2016-08-03: 1 mg via INTRAVENOUS

## 2016-08-03 MED ORDER — IOPAMIDOL (ISOVUE-370) INJECTION 76%
INTRAVENOUS | Status: AC
  Filled 2016-08-03: qty 100

## 2016-08-03 MED ORDER — SODIUM CHLORIDE 0.9 % IV SOLN: 250.0000 mL | INTRAVENOUS | Status: DC | PRN

## 2016-08-03 MED ORDER — PROMETHAZINE HCL 25 MG/ML IJ SOLN: 6.2500 mg | Freq: Three times a day (TID) | INTRAMUSCULAR | Status: DC | PRN

## 2016-08-03 MED ORDER — FENTANYL CITRATE (PF) 100 MCG/2ML IJ SOLN
INTRAMUSCULAR | Status: DC | PRN
  Administered 2016-08-03: 25 ug via INTRAVENOUS

## 2016-08-03 MED ORDER — LOPERAMIDE HCL 2 MG PO CAPS: 4.0000 mg | ORAL_CAPSULE | ORAL | Status: DC | PRN

## 2016-08-03 MED ORDER — HEPARIN SODIUM (PORCINE) 1000 UNIT/ML IJ SOLN
INTRAMUSCULAR | Status: AC
  Filled 2016-08-03: qty 1

## 2016-08-03 MED ORDER — SODIUM CHLORIDE 0.9% FLUSH
3.0000 mL | Freq: Two times a day (BID) | INTRAVENOUS | Status: DC
  Administered 2016-08-03: 3 mL via INTRAVENOUS

## 2016-08-03 MED ORDER — LIDOCAINE HCL (PF) 1 % IJ SOLN
INTRAMUSCULAR | Status: DC | PRN
  Administered 2016-08-03: 3 mL via INTRADERMAL

## 2016-08-03 MED ORDER — MIDAZOLAM HCL 2 MG/2ML IJ SOLN
INTRAMUSCULAR | Status: AC
  Filled 2016-08-03: qty 2

## 2016-08-03 MED ORDER — VERAPAMIL HCL 2.5 MG/ML IV SOLN
INTRAVENOUS | Status: AC
  Filled 2016-08-03: qty 2

## 2016-08-03 MED ORDER — SODIUM CHLORIDE 0.9% FLUSH
3.0000 mL | Freq: Two times a day (BID) | INTRAVENOUS | Status: DC
  Administered 2016-08-03 – 2016-08-04 (×2): 3 mL via INTRAVENOUS

## 2016-08-03 MED ORDER — HEPARIN (PORCINE) IN NACL 2-0.9 UNIT/ML-% IJ SOLN
INTRAMUSCULAR | Status: AC
  Filled 2016-08-03: qty 1000

## 2016-08-03 MED ORDER — HEPARIN (PORCINE) IN NACL 2-0.9 UNIT/ML-% IJ SOLN
INTRAMUSCULAR | Status: DC | PRN
  Administered 2016-08-03: 10 mL via INTRA_ARTERIAL

## 2016-08-03 MED ORDER — HEPARIN (PORCINE) IN NACL 2-0.9 UNIT/ML-% IJ SOLN
INTRAMUSCULAR | Status: DC | PRN
  Administered 2016-08-03: 1000 mL via INTRA_ARTERIAL

## 2016-08-03 MED ORDER — SODIUM CHLORIDE 0.9 % WEIGHT BASED INFUSION: 3.0000 mL/kg/h | INTRAVENOUS | Status: DC

## 2016-08-03 MED ORDER — ASPIRIN 81 MG PO CHEW
81.0000 mg | CHEWABLE_TABLET | ORAL | Status: AC
  Administered 2016-08-03: 81 mg via ORAL
  Filled 2016-08-03: qty 1

## 2016-08-03 MED ORDER — LOPERAMIDE HCL 2 MG PO CAPS
2.0000 mg | ORAL_CAPSULE | Freq: Once | ORAL | Status: AC
  Administered 2016-08-03: 2 mg via ORAL
  Filled 2016-08-03: qty 1

## 2016-08-03 MED ORDER — HEPARIN SODIUM (PORCINE) 1000 UNIT/ML IJ SOLN
INTRAMUSCULAR | Status: DC | PRN
  Administered 2016-08-03: 4000 [IU] via INTRAVENOUS

## 2016-08-03 SURGICAL SUPPLY — 10 items
CATH INFINITI JR4 5F (CATHETERS) ×3 IMPLANT
CATH OPTITORQUE TIG 4.0 5F (CATHETERS) ×3 IMPLANT
DEVICE RAD COMP TR BAND LRG (VASCULAR PRODUCTS) ×3 IMPLANT
GLIDESHEATH SLEND A-KIT 6F 22G (SHEATH) ×3 IMPLANT
GUIDEWIRE INQWIRE 1.5J.035X260 (WIRE) ×1 IMPLANT
INQWIRE 1.5J .035X260CM (WIRE) ×3
KIT HEART LEFT (KITS) ×3 IMPLANT
PACK CARDIAC CATHETERIZATION (CUSTOM PROCEDURE TRAY) ×3 IMPLANT
TRANSDUCER W/STOPCOCK (MISCELLANEOUS) ×3 IMPLANT
TUBING CIL FLEX 10 FLL-RA (TUBING) ×3 IMPLANT

## 2016-08-03 NOTE — Telephone Encounter (Signed)
LMOVM reminding pt to send remote transmission.   

## 2016-08-03 NOTE — Progress Notes (Signed)
TR Band removed at 1430---30 min post band complete deflation. Site level zero. Coban applied, pt teaching done.

## 2016-08-03 NOTE — Brief Op Note (Signed)
   BRIEF CATH NOTE  07/29/2016 - 08/03/2016  9:54 AM  PATIENT:  Lindsey Bryant  76 y.o. female who was admitted for abdominal pain and thought to be ileitis versus internal hernia. She had positive troponin levels of roughly 2 with a regional wall motion noted on echocardiogram. 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 No significant culprit lesion to explain regional wall motion abnormality.  Mildly elevated LVEDP  PROCEDURE:  Procedure(s): Left Heart Cath and Coronary Angiography (N/A)   Right radial access, 6 French sheath using Seldinger technique.  5 [place] TIG 4.0 catheter advanced over wire into the ascending aorta. Use for measuring left ventricular hemodynamics as well as selective angiography of the left right coronary arteries. Catheter then removed out of the body over wire without complication.  Left ventricle not performed patient - just had an echocardiogram.  SURGEON:  Surgeon(s) and Role:    * Leonie Man, MD - Primary  ANESTHESIA:   local and IV sedation; 3 mL subcutaneous lidocaine, 1 mg Versed, 25 g fentanyl - sedation time 31 minutes  EBL:  No intake/output data recorded.< 50 mL  MEDICATIONS USED:    Contrast 70 Ml  Radial cocktail: 3 mL verapamil in 10 mL NS.  IV Heparin 4000 Units  COUNTS:  YES all sharps accounted for  TR Band: 0950 hrs., 12 mL air    DICTATION: .Note written in [place]: Return to nursing unit for ongoing care. Medical management of mild to moderate CAD  PATIENT DISPOSITION:  PACU - hemodynamically stable.     [name], M.D., M.S. Interventional Cardiologist   Pager # [phone] Phone # [phone] [address] [place], [place] [address]

## 2016-08-03 NOTE — Interval H&P Note (Signed)
History and Physical Interval Note:  08/03/2016 9:08 AM  Lindsey Bryant  has presented today for surgery, with the diagnosis of cp - positive trop / NSTEMI  The various methods of treatment have been discussed with the patient and family. After consideration of risks, benefits and other options for treatment, the patient has consented to  Procedure(s): Left Heart Cath and Coronary Angiography (N/A) and possible Percutaneous Coronary Intervention as a surgical intervention .  The patient's history has been reviewed, patient examined, no change in status, stable for surgery.  I have reviewed the patient's chart and labs.  Questions were answered to the patient's satisfaction.    Cath Lab Visit (complete for each Cath Lab visit)  Clinical Evaluation Leading to the Procedure:   ACS: Yes.    Non-ACS:    Anginal Classification: No Symptoms  Anti-ischemic medical therapy: Minimal Therapy (1 class of medications)  Non-Invasive Test Results: Equivocal test results - Echo with reduced LVEF & regional WMA & + Troponin.  Prior CABG: No previous CABG    [name]

## 2016-08-03 NOTE — Progress Notes (Signed)
Progress Note  Patient Name: Lindsey Bryant Date of Encounter: 08/03/2016  Primary Cardiologist: Klein/Nahser  Subjective   For cath today Still concerned about amount of diarrhea she is having Dr Donne Hazel Seen and no plans for surgery   Inpatient Medications    Scheduled Meds: . [MAR Hold] aspirin  81 mg Oral Daily  . [MAR Hold] atorvastatin  80 mg Oral q1800  . [MAR Hold] calcium carbonate  1 tablet Oral BID WC  . [MAR Hold] carbidopa-levodopa  2 tablet Oral QID  . [MAR Hold] lisinopril  5 mg Oral Daily  . [MAR Hold] potassium chloride  40 mEq Oral Once  . [MAR Hold] sodium chloride flush  3 mL Intravenous Q12H  . sodium chloride flush  3 mL Intravenous Q12H   Continuous Infusions: . [START ON 08/04/2016] sodium chloride     Followed by  . [START ON 08/04/2016] sodium chloride     PRN Meds: sodium chloride, [MAR Hold] acetaminophen **OR** [MAR Hold] acetaminophen, [MAR Hold]  morphine injection, [MAR Hold] ondansetron **OR** [MAR Hold] ondansetron (ZOFRAN) IV, [MAR Hold] promethazine, sodium chloride flush   Vital Signs    Vitals:   08/02/16 1000 08/02/16 1714 08/02/16 2350 08/03/16 0414  BP: 126/74 (!) 110/58 117/77 119/65  Pulse: 74 87 88 80  Resp: 18 18 18 17   Temp: 98.6 F (37 C) 98.2 F (36.8 C) 97.7 F (36.5 C) 98.2 F (36.8 C)  TempSrc: Oral Oral Oral Oral  SpO2: 100% 98% 97% 95%  Weight:   143 lb 4.8 oz (65 kg)   Height:   5\' 2"  (1.575 m)     Intake/Output Summary (Last 24 hours) at 08/03/16 0900 Last data filed at 08/02/16 2104  Gross per 24 hour  Intake              483 ml  Output                2 ml  Net              481 ml   Filed Weights   08/01/16 2220 08/02/16 0559 08/02/16 2350  Weight: 141 lb 12.8 oz (64.3 kg) 141 lb 12.8 oz (64.3 kg) 143 lb 4.8 oz (65 kg)    Telemetry    NSR 08/03/2016  - Personally Reviewed  ECG    P synch pacing LBBB 07/29/16 - Personally Reviewed  Physical Exam  Affect appropriate Healthy:  appears stated  age HEENT: normal Neck supple with no adenopathy JVP normal no bruits no thyromegaly Lungs clear with no wheezing and good diaphragmatic motion Heart:  S1/S2 no murmur, no rub, gallop or click Pacer under left clavicle  PMI normal Abdomen: benighn, BS positve, no tenderness, no AAA no bruit.  No HSM or HJR Distal pulses intact with no bruits No edema Neuro non-focal Skin warm and dry No muscular weakness   Labs    Chemistry Recent Labs Lab 07/29/16 0731  07/31/16 1614 08/01/16 0658 08/02/16 0546  NA 138  < > 129* 131* 135  K 3.2*  < > 3.7 3.5 3.5  CL 105  < > 101 100* 103  CO2 22  < > 22 25 23   GLUCOSE 231*  < > 113* 95 77  BUN 12  < > 7 7 7   CREATININE 0.86  < > 0.78 0.74 0.78  CALCIUM 9.0  < > 8.5* 8.7* 8.4*  PROT 6.6  --   --   --   --  ALBUMIN 3.8  --   --   --   --   AST 22  --   --   --   --   ALT 12*  --   --   --   --   ALKPHOS 41  --   --   --   --   BILITOT 0.6  --   --   --   --   GFRNONAA >60  < > >60 >60 >60  GFRAA >60  < > >60 >60 >60  ANIONGAP 11  < > 6 6 9   < > = values in this interval not displayed.   Hematology  Recent Labs Lab 07/31/16 1614 08/01/16 0658 08/02/16 0546  WBC 16.1* 12.8* 9.7  RBC 3.58* 3.48* 3.31*  HGB 11.7* 11.4* 11.0*  HCT 35.3* 33.9* 32.6*  MCV 98.6 97.4 98.5  MCH 32.7 32.8 33.2  MCHC 33.1 33.6 33.7  RDW 12.9 12.7 13.0  PLT 102* 99* 101*    Cardiac Enzymes  Recent Labs Lab 07/29/16 1400 07/29/16 1920  TROPONINI 1.95* 1.58*     Recent Labs Lab 07/29/16 0755  TROPIPOC 0.10*      Radiology    Ct Entero Abd/pelvis W Contast  Result Date: 08/01/2016 CLINICAL DATA:  Abnormal ileum on previous CT. EXAM: CT ABDOMEN AND PELVIS WITH CONTRAST (ENTEROGRAPHY) TECHNIQUE: Multidetector CT of the abdomen and pelvis during bolus administration of intravenous contrast. Negative oral contrast was given. CONTRAST:  134mL ISOVUE-300 IOPAMIDOL (ISOVUE-300) INJECTION 61% COMPARISON:  07/29/2016 FINDINGS: Lower chest:  Small bilateral pleural effusions, right greater than left. Compressive atelectasis in the lower lobes, right greater than left. Hepatobiliary: No focal abnormality within the liver parenchyma. There is no evidence for gallstones, gallbladder wall thickening, or pericholecystic fluid. No intrahepatic or extrahepatic biliary dilation. Pancreas: No focal mass lesion. No dilatation of the main duct. No intraparenchymal cyst. No peripancreatic edema. Spleen: No splenomegaly. No focal mass lesion. Adrenals/Urinary Tract: No adrenal nodule or mass. Kidneys unremarkable. No evidence for hydroureter. The urinary bladder appears normal for the degree of distention. Stomach/Bowel: Small hiatal hernia noted. Stomach otherwise unremarkable without wall thickening or abnormal mural enhancement. Duodenum is distended but otherwise unremarkable. Jejunal loops are more distended than typically seen in the setting of CT enterography. There is associated interloop mesenteric fluid while the bowel wall in the jejunum appears thickened, assessment of the nondependent gas in the segments of bowel demonstrate normal wall thickness. The ileal loops in the right lower quadrant show evidence of mural enhancement without hyperenhancement. On today's exam, 2 discrete small bowel transitions zones are identified in the right lower quadrant. One of these is evident on axial image 40 of series 3. The second is seen on axial image 51 of series 3. These areas are also fairly well demonstrated on sagittal images (see 1 transition zone images 38-47 of series 7 in the of the transition zone is more anterior on images 32-38 of series 7). Colon is decompressed. Vascular/Lymphatic: There is abdominal aortic atherosclerosis without aneurysm. Portal vein is patent in superior mesenteric vein is patent. Celiac axis and SMA opacified normally. Reproductive: The uterus has normal CT imaging appearance. There is no adnexal mass. Other: Small to moderate  volume ascites evident in the abdomen and pelvis. Musculoskeletal: Bone windows reveal no worrisome lytic or sclerotic osseous lesions. IMPRESSION: 1. Proximal small bowel has become more distended in the interval measuring 3.8 cm in diameter. Interloop mesenteric fluid persists but does appear to have decreased  in the interval. There is more intraperitoneal free fluid at this time. The distal small bowel wall thickening seen on the previous study also appears improved. 2. On today's exam, 2 discrete small bowel transitions zones are identified in the right peritoneal cavity. Given the presence of 2 transition zones, closed loop obstruction and internal hernia remain distinct considerations. 3. Interval development of small bilateral pleural effusions and bibasilar atelectasis. Electronically Signed   By: Misty Stanley M.D.   On: 08/01/2016 21:43    Cardiac Studies   Echo 07/30/16 personally reviewed.  EF 35-40% with RWMA;s Akinesis of the mid-apical anterior and   inferior myocardium. Hypokinesis of the mid to apical   anteroseptal, inferoseptal,anterolateral , inferolateral   myocardium. Doppler   Patient Profile     76 y.o. female admitted with possible small bowel ischemia. Noted to have elevated Troponin and echo with new RWMA;s and decreased EF  Assessment & Plan    Troponin:  ECG not revealing due to pacing Troponin peak 1.95 07/29/16 No chest pain Cath today to define anatomy   Low EF:  Clinically euvolemic started on  low dose ACE.   Pacer:  Normal function f/u Dr Caryl Comes  GI:  Clinically improved CT with possible internal small bowel hernia no plans for surgery So if stent needed would proceed todeay   Parkinson's. :  Continue sinemet stable  Over 35 minutes spent face to face counseling patient and family on CHF, elevated enzymes and need For heart cath to be done today Since no plans for abdominal surgery would proceed with intervention Today if needed consider using BMS    Signed, Jenkins Rouge, MD  08/03/2016, 9:00 AM

## 2016-08-03 NOTE — Progress Notes (Signed)
Day of Surgery  Subjective: No real abd pain, diarrhea at night not during the day, tol diet  Objective: Vital signs in last 24 hours: Temp:  [97.7 F (36.5 C)-98.2 F (36.8 C)] 98.2 F (36.8 C) (03/13 0414) Pulse Rate:  [0-93] 81 (03/13 1400) Resp:  [0-23] 16 (03/13 1415) BP: (87-141)/(34-109) 131/67 (03/13 1410) SpO2:  [0 %-100 %] 98 % (03/13 1400) Weight:  [65 kg (143 lb 4.8 oz)] 65 kg (143 lb 4.8 oz) (03/12 2350) Last BM Date: 08/01/16  Intake/Output from previous day: 03/12 0701 - 03/13 0700 In: 723 [P.O.:720; I.V.:3] Out: 3 [Urine:2; Stool:1] Intake/Output this shift: No intake/output data recorded.  GI: soft nontender  Lab Results:   Recent Labs  08/01/16 0658 08/02/16 0546  WBC 12.8* 9.7  HGB 11.4* 11.0*  HCT 33.9* 32.6*  PLT 99* 101*   BMET  Recent Labs  08/01/16 0658 08/02/16 0546  NA 131* 135  K 3.5 3.5  CL 100* 103  CO2 25 23  GLUCOSE 95 77  BUN 7 7  CREATININE 0.74 0.78  CALCIUM 8.7* 8.4*   PT/INR No results for input(s): LABPROT, INR in the last 72 hours. ABG No results for input(s): PHART, HCO3 in the last 72 hours.  Invalid input(s): PCO2, PO2  Studies/Results: Ct Entero Abd/pelvis W Contast  Result Date: 08/01/2016 CLINICAL DATA:  Abnormal ileum on previous CT. EXAM: CT ABDOMEN AND PELVIS WITH CONTRAST (ENTEROGRAPHY) TECHNIQUE: Multidetector CT of the abdomen and pelvis during bolus administration of intravenous contrast. Negative oral contrast was given. CONTRAST:  124mL ISOVUE-300 IOPAMIDOL (ISOVUE-300) INJECTION 61% COMPARISON:  07/29/2016 FINDINGS: Lower chest: Small bilateral pleural effusions, right greater than left. Compressive atelectasis in the lower lobes, right greater than left. Hepatobiliary: No focal abnormality within the liver parenchyma. There is no evidence for gallstones, gallbladder wall thickening, or pericholecystic fluid. No intrahepatic or extrahepatic biliary dilation. Pancreas: No focal mass lesion. No  dilatation of the main duct. No intraparenchymal cyst. No peripancreatic edema. Spleen: No splenomegaly. No focal mass lesion. Adrenals/Urinary Tract: No adrenal nodule or mass. Kidneys unremarkable. No evidence for hydroureter. The urinary bladder appears normal for the degree of distention. Stomach/Bowel: Small hiatal hernia noted. Stomach otherwise unremarkable without wall thickening or abnormal mural enhancement. Duodenum is distended but otherwise unremarkable. Jejunal loops are more distended than typically seen in the setting of CT enterography. There is associated interloop mesenteric fluid while the bowel wall in the jejunum appears thickened, assessment of the nondependent gas in the segments of bowel demonstrate normal wall thickness. The ileal loops in the right lower quadrant show evidence of mural enhancement without hyperenhancement. On today's exam, 2 discrete small bowel transitions zones are identified in the right lower quadrant. One of these is evident on axial image 40 of series 3. The second is seen on axial image 51 of series 3. These areas are also fairly well demonstrated on sagittal images (see 1 transition zone images 38-47 of series 7 in the of the transition zone is more anterior on images 32-38 of series 7). Colon is decompressed. Vascular/Lymphatic: There is abdominal aortic atherosclerosis without aneurysm. Portal vein is patent in superior mesenteric vein is patent. Celiac axis and SMA opacified normally. Reproductive: The uterus has normal CT imaging appearance. There is no adnexal mass. Other: Small to moderate volume ascites evident in the abdomen and pelvis. Musculoskeletal: Bone windows reveal no worrisome lytic or sclerotic osseous lesions. IMPRESSION: 1. Proximal small bowel has become more distended in the interval measuring 3.8  cm in diameter. Interloop mesenteric fluid persists but does appear to have decreased in the interval. There is more intraperitoneal free fluid at  this time. The distal small bowel wall thickening seen on the previous study also appears improved. 2. On today's exam, 2 discrete small bowel transitions zones are identified in the right peritoneal cavity. Given the presence of 2 transition zones, closed loop obstruction and internal hernia remain distinct considerations. 3. Interval development of small bilateral pleural effusions and bibasilar atelectasis. Electronically Signed   By: Misty Stanley M.D.   On: 08/01/2016 21:43    Anti-infectives: Anti-infectives    Start     Dose/Rate Route Frequency Ordered Stop   07/29/16 1700  piperacillin-tazobactam (ZOSYN) IVPB 3.375 g  Status:  Discontinued     3.375 g 12.5 mL/hr over 240 Minutes Intravenous Every 8 hours 07/29/16 1133 07/31/16 1759   07/29/16 0830  piperacillin-tazobactam (ZOSYN) IVPB 3.375 g     3.375 g 100 mL/hr over 30 Minutes Intravenous  Once 07/29/16 0830 07/29/16 1009      Assessment/Plan: abd pain ?enteritis, ?internal hernia  She is really asymptomatic except for diarrhea.  I dont think any real role for surgery at this point. Can advance diet and agree with Dr Marla Roe plan.    [facility] 08/03/2016

## 2016-08-03 NOTE — Progress Notes (Signed)
PROGRESS NOTE    Lindsey Bryant  YYT:[id number] DOB: September 03, 1940 DOA: 07/29/2016 PCP: [name], MD   Brief Narrative:  76 year old female with history of Parkinson's disease, IBS, complete heart block s/p pacemaker who came to the ED with complaints of abrupt onset lower abdominal pain, nausea, and vomiting. CT abdomen/pelvis showed small bowel wall thickening and decreased enhancement without SBO or colonic abnormality. IV fluids and zosyn were given, surgery was consulted and TRH admitted to the SDU. GI pathogen panel was negative. General surgery suspected gastroenteritis, doubtful of mesenteric ischemia, and recommended bowel rest and IV hydration. With advancing diet, she had nausea alone until 3/10 when she had 2 episodes of nonbloody emesis and several episodes of blood in loose stools. GI was consulted and after review of CT findings with radiology, felt concerned for internal hernia, possibly resolve mesenteric ischemia. CT enterography performed 3/11 showed increased dilation of proximal small bowel, decreased interloop mesenteric free fluid, increased intraperitoneal free fluid, and improved distal small bowel thickening. 2 small bowel transition zones were identified in the right peritoneal cavity, raising consideration of closed loop obstruction and internal hernia.  Troponin elevation was noted on initial work up prompting cardiology evaluation and echocardiogram. The patient denies chest pain, dyspnea, and has an active lifestyle. Surprisingly, the echo showed EF 35-40% (previously 50-55% without RWMA in 2014) with mid-apical anterior/inferior akinesis. LHC 3/13 showed moderate disease involving the proximal RCA and distal LAD. No significant culprit lesion to explain regional wall motion abnormality. She had post-cath bleeding which has resolved for the time being.  Assessment & Plan:   Principal Problem:   Abdominal pain Active Problems:   Parkinson's disease (HCC)  Pacemaker-[employer]   Elevated lactic acid level   Elevated troponin   Hypokalemia   Enteritis   Hyperglycemia   Elevated TSH   Lactic acidosis   NSTEMI (non-ST elevated myocardial infarction) (HCC)   Acute systolic heart failure (HCC)   Hematochezia   Obstructed internal hernia   Abnormal CT scan, small bowel   Diarrhea  Abdominal pain, N/V: Enteritis vs. mesenteric ischemia vs. internal hernia thought to be most likely possibilities. Lactate trended downward and exam is reassuring that any ischmia is not ongoing. Leukocytosis resolved on 3/12. Zosyn was discontinued once GI pathogen panel from 3/8 returned negative. - GI consulted due to bloody diarrhea: CT enterography 3/11 with evidence of small bowel increased dilation of proximal small bowel, decreased interloop mesenteric free fluid, increased intraperitoneal free fluid, and improved distal small bowel thickening. 2 small bowel transition zones were identified in the right peritoneal cavity, raising consideration of closed loop obstruction and internal hernia. - General surgery saw and initially signed off, asked to reevaluate after CT enterography. Still not sure of final diagnosis, but plan is for watchful waiting - Advancing diet to heart healthy. Can DC in AM if able to tolerate diet and maintain hydration status. - Antiemetics as needed: Zofran and phenergan.  GI bleeding: +FOBT. General surgery signed off. Has had colonoscopy by Dr. Henrene Pastor in 2015 with a single diminutive rectal polyp which was cold snared. - Consulted [facility]: Plan is probiotic at discharge, loperamide prn, and outpatient follow up. - Hgb 11 on 3/12 without further bleeding.   New onset systolic dysfunction with NSTEMI: Echo checked for elevated troponin, showed EF 35-40% (previously 50-55% without RWMA in 2014) with mid-apical anterior/inferior akinesis. Also showed severe LA dilation. Had reported VTach per EMS, since resolved, thought to have been  artifact. Appears euvolemic. - Cardiology  following: LHC 3/13 with no critical stenosis; moderate stenosis in prox RCA and distal LAD without interventions. Cardiology is setting up outpatient follow up. - Lipitor 80mg , aspirin 81mg , low dose lisinopril started, cautious with GI bleeding.  Hypokalemia: Resolved. - Monitor with GI losses  Parkinson's disease: Chronic, stable - Continue home medications: sinemet  Hyperglycemia: Resolved, CBGs stable. HbA1c 5.3%.  - DC CBG's and SSI.  DVT prophylaxis:  SCDs Code Status:  Full code Family Communication:  Daughter and husband present at bedside. Disposition Plan: Anticipate DC to home in next 24 hours.  Consultants:   General surgery, Dr. Georgette Dover & Dr. Donne Hazel.  Cardiology, Dr. Acie Fredrickson  Gastroenterology, Dr. [name]  Procedures:  Left Heart Cath and Coronary Angiography (Dr. Ellyn Hack 08/03/2016)   Right radial access, 6 French sheath using Seldinger technique.  5 [place] TIG 4.0 catheter advanced over wire into the ascending aorta. Use for measuring left ventricular hemodynamics as well as selective angiography of the left right coronary arteries. Catheter then removed out of the body over wire without complication.  Left ventricle not performed patient - just had an echocardiogram.  Moderate disease involving the proximal RCA and distal LAD. No significant culprit lesion to explain regional wall motion abnormality.  Mildly elevated LVEDP  Antimicrobials:   Zosyn 3/8 > 3/10  Subjective: Abdominal pain remains resolved, still with nausea without emesis, nocturnal watery diarrhea but has yet to have solid food. No bleeding. Continues to deny any chest pain, dyspnea, palpitations, orthopnea, leg swelling.   Objective: Vitals:   08/03/16 1400 08/03/16 1405 08/03/16 1410 08/03/16 1415  BP:   131/67   Pulse: 81     Resp: 16 17 16 16   Temp:      TempSrc:      SpO2: 98%     Weight:      Height:        Intake/Output Summary  (Last 24 hours) at 08/03/16 1442 Last data filed at 08/03/16 1300  Gross per 24 hour  Intake              243 ml  Output                1 ml  Net              242 ml   Filed Weights   08/01/16 2220 08/02/16 0559 08/02/16 2350  Weight: 64.3 kg (141 lb 12.8 oz) 64.3 kg (141 lb 12.8 oz) 65 kg (143 lb 4.8 oz)    Examination: General exam: 76yo M in no distress sitting in chair Respiratory system: Clear to auscultation. Respiratory effort normal. Cardiovascular system: S1 & S2 heard, RRR. No JVD, murmurs, rubs, gallops or clicks. No pedal edema. Gastrointestinal system: Abdomen is nondistended, soft and nontender. No organomegaly or masses felt. Normal bowel sounds heard. Central nervous system: Alert and oriented. No focal neurological deficits. Intention tremor and bradykinesia noted. Extremities: Warm, no deformities Skin: No rashes, lesions or ulcers Psychiatry: Judgement and insight appear normal. Mood euthymic with full affect   Data Reviewed:   CBC:  Recent Labs Lab 07/29/16 0731 07/30/16 0334 07/31/16 1614 08/01/16 0658 08/02/16 0546  WBC 20.9* 17.7* 16.1* 12.8* 9.7  NEUTROABS 16.7*  --   --   --   --   HGB 14.0 12.8 11.7* 11.4* 11.0*  HCT 42.1 39.4 35.3* 33.9* 32.6*  MCV 99.5 99.0 98.6 97.4 98.5  PLT 162 121* 102* 99* 193*   Basic Metabolic Panel:  Recent Labs Lab 07/29/16  1696 07/30/16 0334 07/31/16 1614 08/01/16 0658 08/02/16 0546  NA 138 138 129* 131* 135  K 3.2* 4.4 3.7 3.5 3.5  CL 105 114* 101 100* 103  CO2 22 18* 22 25 23   GLUCOSE 231* 122* 113* 95 77  BUN 12 10 7 7 7   CREATININE 0.86 0.74 0.78 0.74 0.78  CALCIUM 9.0 7.6* 8.5* 8.7* 8.4*  MG  --   --  1.8 1.8 1.9   GFR: Estimated Creatinine Clearance: 54.6 mL/min (by C-G formula based on SCr of 0.78 mg/dL). Liver Function Tests:  Recent Labs Lab 07/29/16 0731  AST 22  ALT 12*  ALKPHOS 41  BILITOT 0.6  PROT 6.6  ALBUMIN 3.8    Recent Labs Lab 07/29/16 0731  LIPASE 27   No results  for input(s): AMMONIA in the last 168 hours. Coagulation Profile:  Recent Labs Lab 07/29/16 1400  INR 1.05   Cardiac Enzymes:  Recent Labs Lab 07/29/16 1400 07/29/16 1920  TROPONINI 1.95* 1.58*   BNP (last 3 results) No results for input(s): PROBNP in the last 8760 hours. HbA1C: No results for input(s): HGBA1C in the last 72 hours. CBG:  Recent Labs Lab 07/30/16 2003 07/31/16 0001 07/31/16 0358 07/31/16 0738 07/31/16 1222  GLUCAP 125* 143* 102* 87 105*   Lipid Profile: No results for input(s): CHOL, HDL, LDLCALC, TRIG, CHOLHDL, LDLDIRECT in the last 72 hours. Thyroid Function Tests: No results for input(s): TSH, T4TOTAL, FREET4, T3FREE, THYROIDAB in the last 72 hours. Anemia Panel: No results for input(s): VITAMINB12, FOLATE, FERRITIN, TIBC, IRON, RETICCTPCT in the last 72 hours. Sepsis Labs:  Recent Labs Lab 07/29/16 1212 07/29/16 1920 07/29/16 2215 07/30/16 0334  LATICACIDVEN 4.2* 4.2* 3.9* 2.0*    Recent Results (from the past 240 hour(s))  Blood culture (routine x 2)     Status: None (Preliminary result)   Collection Time: 07/29/16  7:32 AM  Result Value Ref Range Status   Specimen Description BLOOD LEFT ANTECUBITAL  Final   Special Requests BOTTLES DRAWN AEROBIC AND ANAEROBIC  5CC  Final   Culture NO GROWTH 4 DAYS  Final   Report Status PENDING  Incomplete  Blood culture (routine x 2)     Status: None (Preliminary result)   Collection Time: 07/29/16  8:34 AM  Result Value Ref Range Status   Specimen Description BLOOD RIGHT HAND  Final   Special Requests BOTTLES DRAWN AEROBIC ONLY  3CC  Final   Culture NO GROWTH 4 DAYS  Final   Report Status PENDING  Incomplete  MRSA PCR Screening     Status: None   Collection Time: 07/29/16  2:28 PM  Result Value Ref Range Status   MRSA by PCR NEGATIVE NEGATIVE Final    Comment:        The GeneXpert MRSA Assay (FDA approved for NASAL specimens only), is one component of a comprehensive MRSA  colonization surveillance program. It is not intended to diagnose MRSA infection nor to guide or monitor treatment for MRSA infections.   Gastrointestinal Panel by PCR , Stool     Status: None   Collection Time: 07/29/16  4:52 PM  Result Value Ref Range Status   Campylobacter species NOT DETECTED NOT DETECTED Final   Plesimonas shigelloides NOT DETECTED NOT DETECTED Final   Salmonella species NOT DETECTED NOT DETECTED Final   Yersinia enterocolitica NOT DETECTED NOT DETECTED Final   Vibrio species NOT DETECTED NOT DETECTED Final   Vibrio cholerae NOT DETECTED NOT DETECTED Final   Enteroaggregative  E coli (EAEC) NOT DETECTED NOT DETECTED Final   Enteropathogenic E coli (EPEC) NOT DETECTED NOT DETECTED Final   Enterotoxigenic E coli (ETEC) NOT DETECTED NOT DETECTED Final   Shiga like toxin producing E coli (STEC) NOT DETECTED NOT DETECTED Final   Shigella/Enteroinvasive E coli (EIEC) NOT DETECTED NOT DETECTED Final   Cryptosporidium NOT DETECTED NOT DETECTED Final   Cyclospora cayetanensis NOT DETECTED NOT DETECTED Final   Entamoeba histolytica NOT DETECTED NOT DETECTED Final   Giardia lamblia NOT DETECTED NOT DETECTED Final   Adenovirus F40/41 NOT DETECTED NOT DETECTED Final   Astrovirus NOT DETECTED NOT DETECTED Final   Norovirus GI/GII NOT DETECTED NOT DETECTED Final   Rotavirus A NOT DETECTED NOT DETECTED Final   Sapovirus (I, II, IV, and V) NOT DETECTED NOT DETECTED Final  Surgical PCR screen     Status: None   Collection Time: 08/03/16  1:06 AM  Result Value Ref Range Status   MRSA, PCR NEGATIVE NEGATIVE Final   Staphylococcus aureus NEGATIVE NEGATIVE Final    Comment:        The Xpert SA Assay (FDA approved for NASAL specimens in patients over 44 years of age), is one component of a comprehensive surveillance program.  Test performance has been validated by [facility] for patients greater than or equal to 81 year old. It is not intended to diagnose infection nor  to guide or monitor treatment.     Radiology Studies: Ct Entero Abd/pelvis W Contast  Result Date: 08/01/2016 CLINICAL DATA:  Abnormal ileum on previous CT. EXAM: CT ABDOMEN AND PELVIS WITH CONTRAST (ENTEROGRAPHY) TECHNIQUE: Multidetector CT of the abdomen and pelvis during bolus administration of intravenous contrast. Negative oral contrast was given. CONTRAST:  119mL ISOVUE-300 IOPAMIDOL (ISOVUE-300) INJECTION 61% COMPARISON:  07/29/2016 FINDINGS: Lower chest: Small bilateral pleural effusions, right greater than left. Compressive atelectasis in the lower lobes, right greater than left. Hepatobiliary: No focal abnormality within the liver parenchyma. There is no evidence for gallstones, gallbladder wall thickening, or pericholecystic fluid. No intrahepatic or extrahepatic biliary dilation. Pancreas: No focal mass lesion. No dilatation of the main duct. No intraparenchymal cyst. No peripancreatic edema. Spleen: No splenomegaly. No focal mass lesion. Adrenals/Urinary Tract: No adrenal nodule or mass. Kidneys unremarkable. No evidence for hydroureter. The urinary bladder appears normal for the degree of distention. Stomach/Bowel: Small hiatal hernia noted. Stomach otherwise unremarkable without wall thickening or abnormal mural enhancement. Duodenum is distended but otherwise unremarkable. Jejunal loops are more distended than typically seen in the setting of CT enterography. There is associated interloop mesenteric fluid while the bowel wall in the jejunum appears thickened, assessment of the nondependent gas in the segments of bowel demonstrate normal wall thickness. The ileal loops in the right lower quadrant show evidence of mural enhancement without hyperenhancement. On today's exam, 2 discrete small bowel transitions zones are identified in the right lower quadrant. One of these is evident on axial image 40 of series 3. The second is seen on axial image 51 of series 3. These areas are also fairly well  demonstrated on sagittal images (see 1 transition zone images 38-47 of series 7 in the of the transition zone is more anterior on images 32-38 of series 7). Colon is decompressed. Vascular/Lymphatic: There is abdominal aortic atherosclerosis without aneurysm. Portal vein is patent in superior mesenteric vein is patent. Celiac axis and SMA opacified normally. Reproductive: The uterus has normal CT imaging appearance. There is no adnexal mass. Other: Small to moderate volume ascites evident in  the abdomen and pelvis. Musculoskeletal: Bone windows reveal no worrisome lytic or sclerotic osseous lesions. IMPRESSION: 1. Proximal small bowel has become more distended in the interval measuring 3.8 cm in diameter. Interloop mesenteric fluid persists but does appear to have decreased in the interval. There is more intraperitoneal free fluid at this time. The distal small bowel wall thickening seen on the previous study also appears improved. 2. On today's exam, 2 discrete small bowel transitions zones are identified in the right peritoneal cavity. Given the presence of 2 transition zones, closed loop obstruction and internal hernia remain distinct considerations. 3. Interval development of small bilateral pleural effusions and bibasilar atelectasis. Electronically Signed   By: Misty Stanley M.D.   On: 08/01/2016 21:43    Scheduled Meds: . aspirin  81 mg Oral Daily  . atorvastatin  80 mg Oral q1800  . calcium carbonate  1 tablet Oral BID WC  . carbidopa-levodopa  2 tablet Oral QID  . heparin  5,000 Units Subcutaneous Q8H  . lisinopril  5 mg Oral Daily  . potassium chloride  40 mEq Oral Once  . sodium chloride flush  3 mL Intravenous Q12H  . sodium chloride flush  3 mL Intravenous Q12H    LOS: 5 days   Time spent: 25 mins   Vance Gather, MD [facility] Pager [phone]   If 7PM-7AM, please contact night-coverage [email or web address] Password [email or web address] 08/03/2016, 2:42 PM

## 2016-08-03 NOTE — Progress Notes (Addendum)
T R Band removed by Seth Bake post being empty x 30 min. Rebleed noted post removal. Band replaced with 13cc. At 1150. Site remains level zero.

## 2016-08-03 NOTE — Progress Notes (Addendum)
   Patient Name: Lindsey Bryant Date of Encounter: 08/03/2016, 2:32 PM    Subjective  Diarrhea at night but not during day No culprit lesion on cath Dr. Donne Hazel not sure about internal hernia - would not operate at this time She is asking about probiotic ?? Should she take Asked if she could see me in f/u   Objective  BP 131/67   Pulse 81   Temp 98.2 F (36.8 C) (Oral)   Resp 16   Ht 5\' 2"  (1.575 m)   Wt 143 lb 4.8 oz (65 kg)   SpO2 98%   BMI 26.21 kg/m  NAD    Assessment and Plan  Abnormal ileum on CT scan - ? Of internal hernia Hematochezia Diarrhea IBS   Overall better and can understand Dr. Cristal Generous assessment and plan - I explained that since she is improving and she is not a typical patient to get internal hernia watchful waiting sensible  Will see what happens with the diarrhea Start align at dc Loperamide prn Outpatient f/u GI - sounds like she has requested change in providers - I am her husband's GI MD am ok with this if Dr. Henrene Pastor is - will see One thing to consider would be a capsule endoscopy later if obstructive changes gone or felt like needed to go ahead and prove no problems - would not do now since she is improved   Gatha Mayer, MD, [facility] [phone] (pager) [phone] after 5 PM, weekends and holidays  08/03/2016 2:32 PM

## 2016-08-03 NOTE — H&P (View-Only) (Signed)
Patient and family have consented to cath Have arranged for tomorrow am 2nd case 9:00 am  With Dr Ellyn Hack orders written

## 2016-08-04 DIAGNOSIS — R1030 Lower abdominal pain, unspecified: Secondary | ICD-10-CM

## 2016-08-04 LAB — CBC
HEMATOCRIT: 31.9 % — AB (ref 36.0–46.0)
HEMOGLOBIN: 10.7 g/dL — AB (ref 12.0–15.0)
MCH: 33.2 pg (ref 26.0–34.0)
MCHC: 33.5 g/dL (ref 30.0–36.0)
MCV: 99.1 fL (ref 78.0–100.0)
Platelets: 121 10*3/uL — ABNORMAL LOW (ref 150–400)
RBC: 3.22 MIL/uL — ABNORMAL LOW (ref 3.87–5.11)
RDW: 13.1 % (ref 11.5–15.5)
WBC: 10.5 10*3/uL (ref 4.0–10.5)

## 2016-08-04 LAB — BASIC METABOLIC PANEL
Anion gap: 8 (ref 5–15)
CALCIUM: 8.1 mg/dL — AB (ref 8.9–10.3)
CHLORIDE: 103 mmol/L (ref 101–111)
CO2: 24 mmol/L (ref 22–32)
Creatinine, Ser: 0.74 mg/dL (ref 0.44–1.00)
GFR calc non Af Amer: >60 mL/min (ref >60)
Glucose, Bld: 109 mg/dL — ABNORMAL HIGH (ref 65–99)
Potassium: 3.4 mmol/L — ABNORMAL LOW (ref 3.5–5.1)
Sodium: 135 mmol/L (ref 135–145)

## 2016-08-04 MED ORDER — ONDANSETRON HCL 8 MG PO TABS: 8.0000 mg | ORAL_TABLET | Freq: Four times a day (QID) | ORAL | 0 refills | Status: DC | PRN

## 2016-08-04 MED ORDER — POTASSIUM CHLORIDE CRYS ER 20 MEQ PO TBCR
40.0000 meq | EXTENDED_RELEASE_TABLET | Freq: Once | ORAL | Status: AC
  Administered 2016-08-04: 40 meq via ORAL
  Filled 2016-08-04: qty 2

## 2016-08-04 MED ORDER — SIMETHICONE 80 MG PO CHEW
160.0000 mg | CHEWABLE_TABLET | Freq: Once | ORAL | Status: AC
  Administered 2016-08-04: 160 mg via ORAL
  Filled 2016-08-04: qty 2

## 2016-08-04 MED ORDER — ATORVASTATIN CALCIUM 80 MG PO TABS: 80.0000 mg | ORAL_TABLET | Freq: Every day | ORAL | 1 refills | Status: DC

## 2016-08-04 MED ORDER — LISINOPRIL 5 MG PO TABS: 5.0000 mg | ORAL_TABLET | Freq: Every day | ORAL | 1 refills | Status: DC

## 2016-08-04 NOTE — Discharge Summary (Addendum)
Physician Discharge Summary  Lindsey Bryant MRN: [id number] DOB/AGE: 1941-03-26 76 y.o.  PCP: [name], MD   Admit date: 07/29/2016 Discharge date: 08/04/2016  Discharge Diagnoses:    Principal Problem:   Abdominal pain Active Problems:   Parkinson's disease ([place])   Pacemaker-[employer]   Elevated lactic acid level   Elevated troponin   Hypokalemia   Enteritis   Hyperglycemia   Elevated TSH   Lactic acidosis   NSTEMI (non-ST elevated myocardial infarction) (HCC)   Acute systolic heart failure (HCC)   Hematochezia   Obstructed internal hernia   Abnormal CT scan, small bowel   Diarrhea    Follow-up recommendations Follow-up with PCP in 3-5 days , including all  additional recommended appointments as below Follow-up CBC, CMP in 3-5 days Needs  f/u echo outpatient .Cardiology will arrange outpatient f/u and echo in 3 months Outpatient f/u GI with Dr [name] , need to consider capsule endoscopy outpatient      Current Discharge Medication List    START taking these medications   Details  atorvastatin (LIPITOR) 80 MG tablet Take 1 tablet (80 mg total) by mouth daily at 6 PM. Qty: 30 tablet, Refills: 1    lisinopril (PRINIVIL,ZESTRIL) 5 MG tablet Take 1 tablet (5 mg total) by mouth daily. Qty: 30 tablet, Refills: 1    ondansetron (ZOFRAN) 8 MG tablet Take 1 tablet (8 mg total) by mouth every 6 (six) hours as needed for nausea. Qty: 60 tablet, Refills: 0      CONTINUE these medications which have NOT CHANGED   Details  aspirin EC 81 MG tablet Take 81 mg by mouth at bedtime.    carbidopa-levodopa (SINEMET IR) 25-100 MG per tablet Take 2 tablets by mouth 4 (four) times daily.     Cholecalciferol (VITAMIN D3) 2000 units capsule Take 2,000 Units by mouth daily.    clidinium-chlordiazePOXIDE (LIBRAX) 5-2.5 MG per capsule Take 1 capsule by mouth 3 (three) times daily as needed. Qty: 60 capsule, Refills: 0    docusate sodium (COLACE) 100 MG capsule Take 100 mg  by mouth daily.    estrogens, conjugated, (PREMARIN) 0.3 MG tablet Take 1 tablet (0.3 mg total) by mouth at bedtime. Qty: 90 tablet, Refills: 3    medroxyPROGESTERone (PROVERA) 2.5 MG tablet TAKE 1 TABLET (2.5 MG TOTAL) BY MOUTH DAILY. Qty: 90 tablet, Refills: 3    mirabegron ER (MYRBETRIQ) 25 MG TB24 tablet Take 1 tablet (25 mg total) by mouth daily. Qty: 90 tablet, Refills: 1    Omega-3 1000 MG CAPS Take 1 capsule by mouth daily.    polyethylene glycol (MIRALAX / GLYCOLAX) packet Take 17 g by mouth daily as needed.    tobramycin (TOBREX) 0.3 % ophthalmic ointment Place 1 application into the right eye 3 (three) times daily.         Discharge Condition: * stable *  Discharge Instructions Get Medicines reviewed and adjusted: Please take all your medications with you for your next visit with your Primary MD  Please request your Primary MD to go over all hospital tests and procedure/radiological results at the follow up, please ask your Primary MD to get all Hospital records sent to his/her office.  If you experience worsening of your admission symptoms, develop shortness of breath, life threatening emergency, suicidal or homicidal thoughts you must seek medical attention immediately by calling 911 or calling your MD immediately if symptoms less severe.  You must read complete instructions/literature along with all the possible adverse reactions/side effects  for all the Medicines you take and that have been prescribed to you. Take any new Medicines after you have completely understood and accpet all the possible adverse reactions/side effects.   Do not drive when taking Pain medications.   Do not take more than prescribed Pain, Sleep and Anxiety Medications  Special Instructions: If you have smoked or chewed Tobacco in the last 2 yrs please stop smoking, stop any regular Alcohol and or any Recreational drug use.  Wear Seat belts while driving.  Please note  You were cared  for by a hospitalist during your hospital stay. Once you are discharged, your primary care physician will handle any further medical issues. Please note that NO REFILLS for any discharge medications will be authorized once you are discharged, as it is imperative that you return to your primary care physician (or establish a relationship with a primary care physician if you do not have one) for your aftercare needs so that they can reassess your need for medications and monitor your lab values.     No Known Allergies    Disposition: 01-Home or Self Care   Consults:  GI SURGERY CARDS    Significant Diagnostic Studies:  Ct Abdomen Pelvis W Contrast  Result Date: 07/29/2016 CLINICAL DATA:  Abdominal pain, nausea, vomiting and diarrhea. EXAM: CT ABDOMEN AND PELVIS WITH CONTRAST TECHNIQUE: Multidetector CT imaging of the abdomen and pelvis was performed using the standard protocol following bolus administration of intravenous contrast. CONTRAST:  100 ml ISOVUE-300 IOPAMIDOL (ISOVUE-300) INJECTION 61% COMPARISON:  None. FINDINGS: Lower chest: No pleural or pericardial effusion. Mild dependent atelectasis is noted. Heart size is normal. Hepatobiliary: The liver is diffusely low attenuating consistent with fatty infiltration. No focal lesion. The gallbladder and biliary tree appear normal. Pancreas: Unremarkable. No pancreatic ductal dilatation or surrounding inflammatory changes. Spleen: Normal in size without focal abnormality. Adrenals/Urinary Tract: Adrenal glands are unremarkable. Kidneys are normal, without renal calculi, focal lesion, or hydronephrosis. Bladder is unremarkable. Stomach/Bowel: Multiple loops of small bowel in the right abdomen and pelvis demonstrate wall thickening and decreased enhancement. There is interloop fluid and a small volume of free pelvic fluid. No evidence of small bowel obstruction. The colon appears normal. Small hiatal hernia is seen. No pneumatosis, portal venous  gas or free intraperitoneal air is identified. Vascular/Lymphatic: No significant vascular findings are present. No enlarged abdominal or pelvic lymph nodes. Reproductive: Uterus and bilateral adnexa are unremarkable. Other: None. Musculoskeletal: No fracture or worrisome lesion. IMPRESSION: Abnormal appearance of multiple loops of small bowel is described above could be due to severe enteritis but bowel ischemia is within the differential. Fatty infiltration of the liver. These results were called by telephone at the time of interpretation on 07/29/2016 at 9:47 am to Dr. Shirlyn Goltz , who verbally acknowledged these results. Electronically Signed   By: Inge Rise M.D.   On: 07/29/2016 09:50   Ct Entero Abd/pelvis W Contast  Result Date: 08/01/2016 CLINICAL DATA:  Abnormal ileum on previous CT. EXAM: CT ABDOMEN AND PELVIS WITH CONTRAST (ENTEROGRAPHY) TECHNIQUE: Multidetector CT of the abdomen and pelvis during bolus administration of intravenous contrast. Negative oral contrast was given. CONTRAST:  124m ISOVUE-300 IOPAMIDOL (ISOVUE-300) INJECTION 61% COMPARISON:  07/29/2016 FINDINGS: Lower chest: Small bilateral pleural effusions, right greater than left. Compressive atelectasis in the lower lobes, right greater than left. Hepatobiliary: No focal abnormality within the liver parenchyma. There is no evidence for gallstones, gallbladder wall thickening, or pericholecystic fluid. No intrahepatic or extrahepatic biliary dilation. Pancreas: No  focal mass lesion. No dilatation of the main duct. No intraparenchymal cyst. No peripancreatic edema. Spleen: No splenomegaly. No focal mass lesion. Adrenals/Urinary Tract: No adrenal nodule or mass. Kidneys unremarkable. No evidence for hydroureter. The urinary bladder appears normal for the degree of distention. Stomach/Bowel: Small hiatal hernia noted. Stomach otherwise unremarkable without wall thickening or abnormal mural enhancement. Duodenum is distended but  otherwise unremarkable. Jejunal loops are more distended than typically seen in the setting of CT enterography. There is associated interloop mesenteric fluid while the bowel wall in the jejunum appears thickened, assessment of the nondependent gas in the segments of bowel demonstrate normal wall thickness. The ileal loops in the right lower quadrant show evidence of mural enhancement without hyperenhancement. On today's exam, 2 discrete small bowel transitions zones are identified in the right lower quadrant. One of these is evident on axial image 40 of series 3. The second is seen on axial image 51 of series 3. These areas are also fairly well demonstrated on sagittal images (see 1 transition zone images 38-47 of series 7 in the of the transition zone is more anterior on images 32-38 of series 7). Colon is decompressed. Vascular/Lymphatic: There is abdominal aortic atherosclerosis without aneurysm. Portal vein is patent in superior mesenteric vein is patent. Celiac axis and SMA opacified normally. Reproductive: The uterus has normal CT imaging appearance. There is no adnexal mass. Other: Small to moderate volume ascites evident in the abdomen and pelvis. Musculoskeletal: Bone windows reveal no worrisome lytic or sclerotic osseous lesions. IMPRESSION: 1. Proximal small bowel has become more distended in the interval measuring 3.8 cm in diameter. Interloop mesenteric fluid persists but does appear to have decreased in the interval. There is more intraperitoneal free fluid at this time. The distal small bowel wall thickening seen on the previous study also appears improved. 2. On today's exam, 2 discrete small bowel transitions zones are identified in the right peritoneal cavity. Given the presence of 2 transition zones, closed loop obstruction and internal hernia remain distinct considerations. 3. Interval development of small bilateral pleural effusions and bibasilar atelectasis. Electronically Signed   By: Misty Stanley M.D.   On: 08/01/2016 21:43   Dg Abd Acute W/chest  Result Date: 07/29/2016 CLINICAL DATA:  Low to mid abdomen pain, nausea and vomit EXAM: DG ABDOMEN ACUTE W/ 1V CHEST COMPARISON:  Chest x-ray of 03/15/2012 FINDINGS: No active infiltrate or effusion is seen. Mediastinal and hilar contours are unremarkable. The heart is borderline enlarged any tool lead permanent pacemaker remains. Supine and left the decubitus films of the abdomen were obtained. No bowel obstruction is seen, with a relative paucity of bowel gas. No free air is seen on the decubitus image. No opaque calculi are seen. A rounded calcification is present in the left bony pelvis possibly a phlebolith, with bladder calculus unlikely. IMPRESSION: 1. No active lung disease. Stable borderline cardiomegaly with permanent pacemaker. 2. No bowel obstruction or free air. 3. Calcification in the left bony pelvis could be a phlebolith, with bladder calculus less likely. Electronically Signed   By: Ivar Drape M.D.   On: 07/29/2016 08:35       Filed Weights   08/02/16 0559 08/02/16 2350 08/03/16 2104  Weight: 64.3 kg (141 lb 12.8 oz) 65 kg (143 lb 4.8 oz) 64.3 kg (141 lb 12.8 oz)     Microbiology: Recent Results (from the past 240 hour(s))  Blood culture (routine x 2)     Status: None   Collection Time: 07/29/16  7:32 AM  Result Value Ref Range Status   Specimen Description BLOOD LEFT ANTECUBITAL  Final   Special Requests BOTTLES DRAWN AEROBIC AND ANAEROBIC  5CC  Final   Culture NO GROWTH 5 DAYS  Final   Report Status 08/03/2016 FINAL  Final  Blood culture (routine x 2)     Status: None   Collection Time: 07/29/16  8:34 AM  Result Value Ref Range Status   Specimen Description BLOOD RIGHT HAND  Final   Special Requests BOTTLES DRAWN AEROBIC ONLY  3CC  Final   Culture NO GROWTH 5 DAYS  Final   Report Status 08/03/2016 FINAL  Final  MRSA PCR Screening     Status: None   Collection Time: 07/29/16  2:28 PM  Result Value Ref Range  Status   MRSA by PCR NEGATIVE NEGATIVE Final    Comment:        The GeneXpert MRSA Assay (FDA approved for NASAL specimens only), is one component of a comprehensive MRSA colonization surveillance program. It is not intended to diagnose MRSA infection nor to guide or monitor treatment for MRSA infections.   Gastrointestinal Panel by PCR , Stool     Status: None   Collection Time: 07/29/16  4:52 PM  Result Value Ref Range Status   Campylobacter species NOT DETECTED NOT DETECTED Final   Plesimonas shigelloides NOT DETECTED NOT DETECTED Final   Salmonella species NOT DETECTED NOT DETECTED Final   Yersinia enterocolitica NOT DETECTED NOT DETECTED Final   Vibrio species NOT DETECTED NOT DETECTED Final   Vibrio cholerae NOT DETECTED NOT DETECTED Final   Enteroaggregative E coli (EAEC) NOT DETECTED NOT DETECTED Final   Enteropathogenic E coli (EPEC) NOT DETECTED NOT DETECTED Final   Enterotoxigenic E coli (ETEC) NOT DETECTED NOT DETECTED Final   Shiga like toxin producing E coli (STEC) NOT DETECTED NOT DETECTED Final   Shigella/Enteroinvasive E coli (EIEC) NOT DETECTED NOT DETECTED Final   Cryptosporidium NOT DETECTED NOT DETECTED Final   Cyclospora cayetanensis NOT DETECTED NOT DETECTED Final   Entamoeba histolytica NOT DETECTED NOT DETECTED Final   Giardia lamblia NOT DETECTED NOT DETECTED Final   Adenovirus F40/41 NOT DETECTED NOT DETECTED Final   Astrovirus NOT DETECTED NOT DETECTED Final   Norovirus GI/GII NOT DETECTED NOT DETECTED Final   Rotavirus A NOT DETECTED NOT DETECTED Final   Sapovirus (I, II, IV, and V) NOT DETECTED NOT DETECTED Final  Surgical PCR screen     Status: None   Collection Time: 08/03/16  1:06 AM  Result Value Ref Range Status   MRSA, PCR NEGATIVE NEGATIVE Final   Staphylococcus aureus NEGATIVE NEGATIVE Final    Comment:        The Xpert SA Assay (FDA approved for NASAL specimens in patients over 76 years of age), is one component of a comprehensive  surveillance program.  Test performance has been validated by [facility] for patients greater than or equal to 77 year old. It is not intended to diagnose infection nor to guide or monitor treatment.        Blood Culture    Component Value Date/Time   SDES BLOOD RIGHT HAND 07/29/2016 0834   SPECREQUEST BOTTLES DRAWN AEROBIC ONLY  3CC 07/29/2016 0834   CULT NO GROWTH 5 DAYS 07/29/2016 0834   REPTSTATUS 08/03/2016 FINAL 07/29/2016 0834      Labs: Results for orders placed or performed during the hospital encounter of 07/29/16 (from the past 48 hour(s))  Surgical PCR screen  Status: None   Collection Time: 08/03/16  1:06 AM  Result Value Ref Range   MRSA, PCR NEGATIVE NEGATIVE   Staphylococcus aureus NEGATIVE NEGATIVE    Comment:        The Xpert SA Assay (FDA approved for NASAL specimens in patients over 55 years of age), is one component of a comprehensive surveillance program.  Test performance has been validated by [facility] for patients greater than or equal to 16 year old. It is not intended to diagnose infection nor to guide or monitor treatment.   CBC     Status: Abnormal   Collection Time: 08/04/16  7:42 AM  Result Value Ref Range   WBC 10.5 4.0 - 10.5 K/uL   RBC 3.22 (L) 3.87 - 5.11 MIL/uL   Hemoglobin 10.7 (L) 12.0 - 15.0 g/dL   HCT 31.9 (L) 36.0 - 46.0 %   MCV 99.1 78.0 - 100.0 fL   MCH 33.2 26.0 - 34.0 pg   MCHC 33.5 30.0 - 36.0 g/dL   RDW 13.1 11.5 - 15.5 %   Platelets 121 (L) 150 - 400 K/uL  Basic metabolic panel     Status: Abnormal   Collection Time: 08/04/16  7:42 AM  Result Value Ref Range   Sodium 135 135 - 145 mmol/L   Potassium 3.4 (L) 3.5 - 5.1 mmol/L   Chloride 103 101 - 111 mmol/L   CO2 24 22 - 32 mmol/L   Glucose, Bld 109 (H) 65 - 99 mg/dL   BUN <5 (L) 6 - 20 mg/dL   Creatinine, Ser 0.74 0.44 - 1.00 mg/dL   Calcium 8.1 (L) 8.9 - 10.3 mg/dL   GFR calc non Af Amer >60 >60 mL/min   GFR calc Af Amer >60 >60 mL/min     Comment: (NOTE) The eGFR has been calculated using the CKD EPI equation. This calculation has not been validated in all clinical situations. eGFR's persistently <60 mL/min signify possible Chronic Kidney Disease.    Anion gap 8 5 - 15     Lipid Panel     Component Value Date/Time   CHOL 199 07/17/2014 1103   TRIG 77.0 07/17/2014 1103   HDL 75.70 07/17/2014 1103   CHOLHDL 3 07/17/2014 1103   VLDL 15.4 07/17/2014 1103   LDLCALC 108 (H) 07/17/2014 1103   LDLDIRECT 111.7 06/05/2012 0900     Lab Results  Component Value Date   HGBA1C 5.3 07/29/2016     Lab Results  Component Value Date   LDLCALC 108 (H) 07/17/2014   CREATININE 0.74 08/04/2016     HPI   76 year old female with history of Parkinson's disease, IBS, complete heart block s/p pacemaker who came to the ED with complaints of abrupt onset lower abdominal pain, nausea, and vomiting. CT abdomen/pelvis showed small bowel wall thickening and decreased enhancement without SBO or colonic abnormality. IV fluids and zosyn were given, surgery was consulted and TRH admitted to the SDU. GI pathogen panel was negative. General surgery suspected gastroenteritis, doubtful of mesenteric ischemia, and recommended bowel rest and IV hydration. With advancing diet, she had nausea alone until 3/10 when she had 2 episodes of nonbloody emesis and several episodes of blood in loose stools. GI was consulted and after review of CT findings with radiology, felt concerned for internal hernia, possibly resolve mesenteric ischemia. CT enterography performed 3/11 showed increased dilation of proximal small bowel, decreased interloop mesenteric free fluid, increased intraperitoneal free fluid, and improved distal small bowel thickening. 2 small bowel  transition zones were identified in the right peritoneal cavity, raising consideration of closed loop obstruction and internal hernia.  Troponin elevation was noted on initial work up prompting cardiology  evaluation and echocardiogram. The patient denies chest pain, dyspnea, and has an active lifestyle. Surprisingly, the echo showed EF 35-40% (previously 50-55% without RWMA in 2014) with mid-apical anterior/inferior akinesis. LHC 3/13 showed moderate disease involving the proximal RCA and distal LAD. No significant culprit lesion to explain regional wall motion abnormality. She had post-cath bleeding which has resolved for the time being.  HOSPITAL COURSE:    Abdominal pain, N/V: Enteritis vs. mesenteric ischemia vs. internal hernia thought to be most likely possibilities. Lactate trended downward and exam is reassuring that any ischmia is not ongoing. Leukocytosis resolved on 3/12. Zosyn was discontinued once GI pathogen panel from 3/8 returned negative. - GI consulted due to bloody diarrhea: CT enterography 3/11 with evidence of small bowel increased dilation of proximal small bowel, decreased interloop mesenteric free fluid, increased intraperitoneal free fluid, and improved distal small bowel thickening. 2 small bowel transition zones were identified in the right peritoneal cavity, raising consideration of closed loop obstruction and internal hernia. - General surgery saw and initially ,initial symptoms   have abated and she has no abd issues right now. ,   Still not sure of final diagnosis( abd pain ?enteritis, ?internal hernia) , but plan is for watchful waiting.No role for surgery at this point - Advancing diet to heart healthy.  She is able to tolerate diet and maintain hydration status. - Antiemetics as needed: Zofran and phenergan. Outpatient f/u GI with Dr [name] , need to consider capsule endoscopy outpatient  Consider starting a probiotic    GI bleeding: +FOBT. General surgery signed off. Has had colonoscopy by Dr. Henrene Pastor in 2015 with a single diminutive rectal polyp which was cold snared. - Consulted [facility]: Plan is probiotic at discharge, loperamide prn, and outpatient follow up. -  Hgb 11 on 3/12 without further bleeding.     New onset systolic dysfunction with NSTEMI: ECG not revealing due to pacing Troponin peak 1.95 07/29/16 No chest pain Cath yesterday with no significant CAD moderate stenosis in prox RCA and distal LAD without interventions.  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 Also showed severe LA dilation. H ad reported VTach per EMS, since resolved, thought to have been artifact. Appears euvolemic.  Cardiology is setting up outpatient follow up. - Lipitor 73m, aspirin 87m low dose lisinopril started, cautious with GI bleeding. Pacer:  Normal function f/u Dr KlCaryl Comesow EF:  Clinically euvolemic ,started on  low dose ACE  Will arrange outpatient f/u and echo in 3 months  Hypokalemia: Resolved. - Monitor with GI losses  Parkinson's disease: Chronic, stable - Continue home medications: sinemet  Hyperglycemia: Resolved, CBGs stable. HbA1c 5.3%.  - DC CBG's and SSI.   Discharge Exam:   Blood pressure 122/74, pulse 73, temperature 97.6 F (36.4 C), temperature source Oral, resp. rate 18, height 5' 2"  (1.575 m), weight 64.3 kg (141 lb 12.8 oz), SpO2 99 %. Cardiovascular system: S1 & S2 heard, RRR. No JVD, murmurs, rubs, gallops or clicks. No pedal edema. Gastrointestinal system: Abdomen is nondistended, soft and nontender. No organomegaly or masses felt. Normal bowel sounds heard. Central nervous system: Alert and oriented. No focal neurological deficits. Intention tremor and bradykinesia noted. Extremities: Warm, no deformities Skin: No rashes, lesions or ulcers Psychiatry: Judgement and insight appear  normal. Mood euthymic with full affect      Follow-up Information    [name], MD. Schedule an appointment as soon as possible for a visit in 1 week(s).   Specialty:  Family Medicine Contact information: [place]  [id number] [phone]        Jenkins Rouge, MD. Call.   Specialty:  Cardiology Why:  to confirm appointment for echo and cardiology follow up Contact information: [address] [place] [address] [phone]           Signed: Reyne Dumas 08/04/2016, 8:58 AM        Time spent >45 mins

## 2016-08-04 NOTE — Progress Notes (Signed)
Lindsey Bryant to be D/C'd Home per MD order.  Discussed prescriptions and follow up appointments with the patient. Prescriptions given to patient, medication list explained in detail. Pt verbalized understanding.  Allergies as of 08/04/2016   No Known Allergies     Medication List    TAKE these medications   aspirin EC 81 MG tablet Take 81 mg by mouth at bedtime.   atorvastatin 80 MG tablet Commonly known as:  LIPITOR Take 1 tablet (80 mg total) by mouth daily at 6 PM.   carbidopa-levodopa 25-100 MG tablet Commonly known as:  SINEMET IR Take 2 tablets by mouth 4 (four) times daily.   clidinium-chlordiazePOXIDE 5-2.5 MG capsule Commonly known as:  LIBRAX Take 1 capsule by mouth 3 (three) times daily as needed.   docusate sodium 100 MG capsule Commonly known as:  COLACE Take 100 mg by mouth daily.   estrogens (conjugated) 0.3 MG tablet Commonly known as:  PREMARIN Take 1 tablet (0.3 mg total) by mouth at bedtime.   lisinopril 5 MG tablet Commonly known as:  PRINIVIL,ZESTRIL Take 1 tablet (5 mg total) by mouth daily.   medroxyPROGESTERone 2.5 MG tablet Commonly known as:  PROVERA TAKE 1 TABLET (2.5 MG TOTAL) BY MOUTH DAILY.   mirabegron ER 25 MG Tb24 tablet Commonly known as:  MYRBETRIQ Take 1 tablet (25 mg total) by mouth daily.   Omega-3 1000 MG Caps Take 1 capsule by mouth daily.   ondansetron 8 MG tablet Commonly known as:  ZOFRAN Take 1 tablet (8 mg total) by mouth every 6 (six) hours as needed for nausea.   polyethylene glycol packet Commonly known as:  MIRALAX / GLYCOLAX Take 17 g by mouth daily as needed.   tobramycin 0.3 % ophthalmic ointment Commonly known as:  TOBREX Place 1 application into the right eye 3 (three) times daily.   Vitamin D3 2000 units capsule Take 2,000 Units by mouth daily.            Durable Medical Equipment        Start     Ordered   08/04/16 1107  For home use only DME 3 n 1  Once     08/04/16 1110   08/04/16 1107   For home use only DME Walker rolling  Once    Comments:  Pt ready for d/c to home please bring to room. Thanks  Question:  Patient needs a walker to treat with the following condition  Answer:  Weakness   08/04/16 1110      Vitals:   08/04/16 0428 08/04/16 0927  BP: 122/74 120/65  Pulse: 73 78  Resp: 18 16  Temp: 97.6 F (36.4 C) 97.8 F (36.6 C)    Skin clean, dry and intact without evidence of skin break down, no evidence of skin tears noted. IV catheter discontinued intact. Site without signs and symptoms of complications. Dressing and pressure applied. Pt denies pain at this time. No complaints noted.  An After Visit Summary was printed and given to the patient. Patient escorted via [place], and D/C home via private auto.  Haywood Lasso BSN, RN [facility] Phone [phone]

## 2016-08-04 NOTE — Progress Notes (Signed)
Progress Note  Patient Name: TAINA LANDRY Date of Encounter: 08/04/2016  Primary Cardiologist: Klein/Nahser  Subjective   Feels well relieved cath is over   Inpatient Medications    Scheduled Meds: . aspirin  81 mg Oral Daily  . atorvastatin  80 mg Oral q1800  . calcium carbonate  1 tablet Oral BID WC  . carbidopa-levodopa  2 tablet Oral QID  . heparin  5,000 Units Subcutaneous Q8H  . lisinopril  5 mg Oral Daily  . potassium chloride  40 mEq Oral Once  . sodium chloride flush  3 mL Intravenous Q12H  . sodium chloride flush  3 mL Intravenous Q12H   Continuous Infusions:  PRN Meds: acetaminophen **OR** acetaminophen, loperamide, morphine injection, ondansetron **OR** [DISCONTINUED] ondansetron (ZOFRAN) IV, promethazine, sodium chloride flush   Vital Signs    Vitals:   08/03/16 1415 08/03/16 1703 08/03/16 2104 08/04/16 0428  BP:  119/68 121/75 122/74  Pulse:  78 77 73  Resp: 16 18 18 18   Temp:  97.6 F (36.4 C) 97.7 F (36.5 C) 97.6 F (36.4 C)  TempSrc:  Oral Oral Oral  SpO2:  98% 100% 99%  Weight:   141 lb 12.8 oz (64.3 kg)   Height:        Intake/Output Summary (Last 24 hours) at 08/04/16 0824 Last data filed at 08/03/16 2212  Gross per 24 hour  Intake              480 ml  Output                0 ml  Net              480 ml   Filed Weights   08/02/16 0559 08/02/16 2350 08/03/16 2104  Weight: 141 lb 12.8 oz (64.3 kg) 143 lb 4.8 oz (65 kg) 141 lb 12.8 oz (64.3 kg)    Telemetry    NSR 08/04/2016  - Personally Reviewed  ECG    P synch pacing LBBB 07/29/16 - Personally Reviewed  Physical Exam  Affect appropriate Healthy:  appears stated age HEENT: normal Neck supple with no adenopathy JVP normal no bruits no thyromegaly Lungs clear with no wheezing and good diaphragmatic motion Heart:  S1/S2 no murmur, no rub, gallop or click Pacer under left clavicle  PMI normal Abdomen: benighn, BS positve, no tenderness, no AAA no bruit.  No HSM or  HJR Distal pulses intact with no bruits No edema Neuro non-focal Skin warm and dry No muscular weakness   Labs    Chemistry Recent Labs Lab 07/29/16 0731  07/31/16 1614 08/01/16 0658 08/02/16 0546  NA 138  < > 129* 131* 135  K 3.2*  < > 3.7 3.5 3.5  CL 105  < > 101 100* 103  CO2 22  < > 22 25 23   GLUCOSE 231*  < > 113* 95 77  BUN 12  < > 7 7 7   CREATININE 0.86  < > 0.78 0.74 0.78  CALCIUM 9.0  < > 8.5* 8.7* 8.4*  PROT 6.6  --   --   --   --   ALBUMIN 3.8  --   --   --   --   AST 22  --   --   --   --   ALT 12*  --   --   --   --   ALKPHOS 41  --   --   --   --  BILITOT 0.6  --   --   --   --   GFRNONAA >60  < > >60 >60 >60  GFRAA >60  < > >60 >60 >60  ANIONGAP 11  < > 6 6 9   < > = values in this interval not displayed.   Hematology  Recent Labs Lab 08/01/16 0658 08/02/16 0546 08/04/16 0742  WBC 12.8* 9.7 10.5  RBC 3.48* 3.31* 3.22*  HGB 11.4* 11.0* 10.7*  HCT 33.9* 32.6* 31.9*  MCV 97.4 98.5 99.1  MCH 32.8 33.2 33.2  MCHC 33.6 33.7 33.5  RDW 12.7 13.0 13.1  PLT 99* 101* 121*    Cardiac Enzymes  Recent Labs Lab 07/29/16 1400 07/29/16 1920  TROPONINI 1.95* 1.58*     Recent Labs Lab 07/29/16 0755  TROPIPOC 0.10*      Radiology    No results found.  Cardiac Studies   Echo 07/30/16 personally reviewed.  EF 35-40% with RWMA;s Akinesis of the mid-apical anterior and   inferior myocardium. Hypokinesis of the mid to apical   anteroseptal, inferoseptal,anterolateral , inferolateral   myocardium. Doppler   Cath:  08/03/16  Personally reviewed films no significant CAD   Patient Profile     76 y.o. female admitted with possible small bowel ischemia. 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   Pacer:  Normal function f/u Dr Caryl Comes  GI:  Clinically improved CT with possible internal small bowel hernia no plans for surgery Clinically improved   Parkinson's. :  Continue sinemet stable  Over 25 minutes spent face to face counseling patient and family on CHF, elevated enzymes and need For f/u echo outpatient f/u ? Stress induced DCM  Will arrange outpatient f/u and echo in 3 months  Sign off   Signed, Jenkins Rouge, MD  08/04/2016, 8:24 AM

## 2016-08-04 NOTE — Evaluation (Signed)
Physical Therapy Evaluation and D/C Patient Details Name: Lindsey Bryant MRN: [id number] DOB: [birth year] Today's Date: 08/04/2016   History of Present Illness  76 year old female with history of Parkinson's disease, IBS, complete heart block s/p pacemaker who came to the ED with complaints of abrupt onset lower abdominal pain, nausea, and vomiting. CT abdomen/pelvis showed small bowel wall thickening and decreased enhancement without SBO or colonic abnormality.  Clinical Impression  Pt admitted with above diagnosis. Pt currently without significant functional limitations. Pt was able to ambulate with RW well and pt and family feel comfortable going home with RW and 3N1 and HEP given by this PT.  They did not want f/u and this PT feels that pt should be able to progress herself with family's assist.   Will not follow as pt is being discharged today.       Follow Up Recommendations No PT follow up;Supervision - Intermittent (Pt/family declined HHPT although recommended)    Equipment Recommendations  Rolling walker with 5" wheels;3in1 (PT)    Recommendations for Other Services       Precautions / Restrictions Precautions Precautions: Fall Restrictions Weight Bearing Restrictions: No      Mobility  Bed Mobility Overal bed mobility: Independent                Transfers Overall transfer level: Needs assistance Equipment used: None Transfers: Sit to/from Stand Sit to Stand: Supervision         General transfer comment: Did not need assist for sit to stand  Ambulation/Gait Ambulation/Gait assistance: Min guard;Min assist Ambulation Distance (Feet): 250 Feet Assistive device: Rolling walker (2 wheeled) Gait Pattern/deviations: Step-through pattern;Decreased stride length;Shuffle;Staggering right   Gait velocity interpretation: Below normal speed for age/gender General Gait Details: Pt with slight balance instability at times without device.  Does well with device although  pt used nothing PTA at baseline and would like to progress back to no device.  Pt overall with good safety with RW.  Occasional noted right side step however pt self corrected balance when using RW.  Pt handled challenges to balance with RW as well. Definitely needed RW for balance.    Stairs Stairs: Yes Stairs assistance: Min assist Stair Management: No rails;Step to pattern;Forwards Number of Stairs: 3 General stair comments: Pt ascended and descended 3 steps with min assist and cues for sequencing steps.  Provided HHA and daughter present to observe technique, assist and cues given.    Wheelchair Mobility    Modified Rankin (Stroke Patients Only)       Balance Overall balance assessment: Needs assistance;History of Falls Sitting-balance support: No upper extremity supported;Feet supported Sitting balance-Leahy Scale: Good     Standing balance support: No upper extremity supported;During functional activity Standing balance-Leahy Scale: Fair Standing balance comment: can stand statically without device.  Needed device and assist with challenges.              High level balance activites: Turns;Direction changes;Sudden stops;Head turns High Level Balance Comments: min guard assist with challenges to balance              Pertinent Vitals/Pain Pain Assessment: No/denies pain   VSS Home Living Family/patient expects to be discharged to:: Private residence Living Arrangements: Spouse/significant other Available Help at Discharge: Family;Available 24 hours/day Type of Home: House Home Access: Stairs to enter Entrance Stairs-Rails: None Entrance Stairs-Number of Steps: 3 Home Layout: One level Home Equipment: None      Prior Function Level of Independence: Independent  Comments: very active PTA, plays golf and does water aerobics     Hand Dominance        Extremity/Trunk Assessment   Upper Extremity Assessment Upper Extremity Assessment: Defer  to OT evaluation    Lower Extremity Assessment Lower Extremity Assessment: Overall WFL for tasks assessed    Cervical / Trunk Assessment Cervical / Trunk Assessment: Normal  Communication   Communication: No difficulties  Cognition Arousal/Alertness: Awake/alert Behavior During Therapy: WFL for tasks assessed/performed Overall Cognitive Status: Within Functional Limits for tasks assessed                      General Comments      Exercises General Exercises - Lower Extremity Ankle Circles/Pumps: AROM;Both;10 reps;Supine Short Arc Quad: AROM;Both;10 reps;Supine Hip ABduction/ADduction: AROM;Both;10 reps;Standing Hip Flexion/Marching: AROM;Both;10 reps;Standing Mini-Sqauts: AROM;Both;10 reps;Standing Other Exercises Other Exercises: STanding hip extenstion x 10 reps Other Exercises: Gave HEP handout to pt    Assessment/Plan    PT Assessment Patent does not need any further PT services  PT Problem List Decreased mobility;Decreased knowledge of use of DME;Decreased activity tolerance       PT Treatment Interventions Gait training;Functional mobility training;Therapeutic exercise;Patient/family education    PT Goals (Current goals can be found in the Care Plan section)  Acute Rehab PT Goals Patient Stated Goal: to go home PT Goal Formulation: All assessment and education complete, DC therapy    Frequency     Barriers to discharge        Co-evaluation               End of Session Equipment Utilized During Treatment: Gait belt Activity Tolerance: Patient limited by fatigue Patient left: with call bell/phone within reach;in bed;with family/visitor present Nurse Communication: Mobility status PT Visit Diagnosis: Unsteadiness on feet (R26.81)         Time: 1046-1110 PT Time Calculation (min) (ACUTE ONLY): 24 min   Charges:   PT Evaluation $PT Eval Moderate Complexity: 1 Procedure PT Treatments $Gait Training: 8-22 mins   PT G Codes:          Godfrey Pick Ainsley Deakins August 22, 2016, 1:01 PM [place] Ala[place] Hayden,PT Acute Rehabilitation [phone] (pager)

## 2016-08-04 NOTE — Progress Notes (Signed)
          Daily Rounding Note  08/04/2016, 11:43 AM  LOS: 6 days   SUBJECTIVE:   Chief complaint: abdominal bloating.  Abdominal cramps overnight, finally had small, loose and still non-bloody BM this AM.  Limited nausea.  Feels better and readying herself for planned discharge today.  Heart healthy diet tolerated last night and this AM  OBJECTIVE:         Vital signs in last 24 hours:    Temp:  [97.6 F (36.4 C)-97.8 F (36.6 C)] 97.8 F (36.6 C) (03/14 0927) Pulse Rate:  [73-90] 78 (03/14 0927) Resp:  [14-23] 16 (03/14 0927) BP: (116-141)/(52-109) 120/65 (03/14 0927) SpO2:  [97 %-100 %] 97 % (03/14 0927) Weight:  [64.3 kg (141 lb 12.8 oz)] 64.3 kg (141 lb 12.8 oz) (03/13 2104) Last BM Date: 08/03/16 Filed Weights   08/02/16 0559 08/02/16 2350 08/03/16 2104  Weight: 64.3 kg (141 lb 12.8 oz) 65 kg (143 lb 4.8 oz) 64.3 kg (141 lb 12.8 oz)   General: pleasant, NAD.  Looks well   Heart: RRR Chest: clear bil Abdomen: soft, hypoactive but no tinkling or tympanitic BS.  Minor tenderness  Extremities: no CCE Neuro/Psych:  Pleasant, fully alert and oriented.   Lab Results:  Recent Labs  08/02/16 0546 08/04/16 0742  WBC 9.7 10.5  HGB 11.0* 10.7*  HCT 32.6* 31.9*  PLT 101* 121*   BMET  Recent Labs  08/02/16 0546 08/04/16 0742  NA 135 135  K 3.5 3.4*  CL 103 103  CO2 23 24  GLUCOSE 77 109*  BUN 7 <5*  CREATININE 0.78 0.74  CALCIUM 8.4* 8.1*    ASSESMENT:   *  Bloody diarrhea, abdominal pain.  CT with distal ileus and ? Internal hernia.  Stool pathogen  panel negative 08/2013 screening Colonoscopy:  Dr Henrene Pastor.  Diminutive rectal polyp, tissue not retrieved.  *  Non-critical thrombocytopenia.   *  No-critical anemia, normal MCV.    *  Elevated Troponins.  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 No significant culprit lesion to explain regional wall motion abnormality.   Mildly elevated LVEDP   PLAN   *  Follow up with Dr [name] vs Dr Henrene Pastor.  Office will arrange.    Azucena Freed  08/04/2016, 11:43 AM Pager: [phone]  Agree with Ms. Sandi Carne assessment and plan. Gatha Mayer, MD, [facility]

## 2016-08-06 ENCOUNTER — Telehealth: Payer: Self-pay

## 2016-08-06 ENCOUNTER — Encounter: Payer: Self-pay | Admitting: Cardiology

## 2016-08-06 NOTE — Telephone Encounter (Signed)
D/C 08/04/16 To: home  Spoke with pt and she states that she is doing very well. She has stopped taking Lipitor as it was "tearing up her stomach". She is still very tired, but does feel she is improving daily.  Appt scheduled with Dr. Elease Hashimoto 08/10/16, pt aware.    Transition Care Management Follow-up Telephone Call  How have you been since you were released from the hospital? Good but tired   Do you understand why you were in the hospital? yes   Do you understand the discharge instrcutions? yes  Items Reviewed:  Medications reviewed: yes  Allergies reviewed: yes  Dietary changes reviewed: yes  Referrals reviewed: yes   Functional Questionnaire:   Activities of Daily Living (ADLs):   She states they are independent in the following: ambulation, bathing and hygiene, feeding, continence, grooming, toileting and dressing States they require assistance with the following: none   Any transportation issues/concerns?: no   Any patient concerns? no   Confirmed importance and date/time of follow-up visits scheduled: yes   Confirmed with patient if condition begins to worsen call PCP or go to the ER.  Patient was given the Call-a-Nurse line [phone]: yes

## 2016-08-10 ENCOUNTER — Encounter: Payer: Self-pay | Admitting: Family Medicine

## 2016-08-10 ENCOUNTER — Ambulatory Visit (INDEPENDENT_AMBULATORY_CARE_PROVIDER_SITE_OTHER): Payer: Medicare Other | Admitting: Family Medicine

## 2016-08-10 VITALS — BP 110/78 | HR 80 | Temp 97.7°F | Wt 131.2 lb

## 2016-08-10 DIAGNOSIS — D500 Iron deficiency anemia secondary to blood loss (chronic): Secondary | ICD-10-CM

## 2016-08-10 DIAGNOSIS — E876 Hypokalemia: Secondary | ICD-10-CM | POA: Diagnosis not present

## 2016-08-10 DIAGNOSIS — R300 Dysuria: Secondary | ICD-10-CM | POA: Diagnosis not present

## 2016-08-10 DIAGNOSIS — I5021 Acute systolic (congestive) heart failure: Secondary | ICD-10-CM | POA: Diagnosis not present

## 2016-08-10 DIAGNOSIS — I214 Non-ST elevation (NSTEMI) myocardial infarction: Secondary | ICD-10-CM | POA: Diagnosis not present

## 2016-08-10 DIAGNOSIS — K529 Noninfective gastroenteritis and colitis, unspecified: Secondary | ICD-10-CM

## 2016-08-10 LAB — POCT URINALYSIS DIPSTICK
BILIRUBIN UA: NEGATIVE
Clarity, UA: NORMAL
GLUCOSE UA: NEGATIVE
Ketones, UA: NEGATIVE
Nitrite, UA: NEGATIVE
Protein, UA: NEGATIVE
RBC UA: NEGATIVE
Spec Grav, UA: 1.005 (ref 1.030–1.035)
Urobilinogen, UA: 0.2 (ref ?–2.0)
pH, UA: 7 (ref 5.0–8.0)

## 2016-08-10 MED ORDER — CILIDINIUM-CHLORDIAZEPOXIDE 2.5-5 MG PO CAPS: 1.0000 | ORAL_CAPSULE | Freq: Three times a day (TID) | ORAL | 0 refills | Status: DC | PRN

## 2016-08-10 NOTE — Patient Instructions (Addendum)
We will call you with lab results Start back the Lisinopril We really need to consider getting back on a statin medication. Let me know if you have any recurrent vomiting or diarrhea. Set up Cardiology and GI follow up soon.

## 2016-08-10 NOTE — Progress Notes (Signed)
Pre visit review using our clinic review tool, if applicable. No additional management support is needed unless otherwise documented below in the visit note. 

## 2016-08-10 NOTE — Progress Notes (Signed)
Subjective:     Patient ID: Lindsey Bryant, female   DOB: [birth year], 76 y.o.   MRN: [id number]  HPI  Patient seen for hospital follow-up. She was admitted on March 8 and discharged on March 14. She has chronic problems including Parkinson's disease, long-standing history of IBS, history of complete heart block and she came to the emergency department with sudden onset of lower abdominal pain and nausea. CT abdomen and pelvis revealed small bowel wall thickening but no evidence for small bowel obstruction. She was treated with IV fluids and broad-spectrum antibiotics and surgical consult obtained.  GI pathogen panel was negative. Initial suspicion was for gastroenteritis and doubtful for mesenteric ischemia. She then developed some nonbloody emesis and multiple episodes of bloody diarrhea. GI consult obtained and there was consideration for possible internal hernia and possible resolving mesenteric ischemia. CT enterography on March 11 showed some improvement in distal small bowel thickening but increased dilation proximal small bowel. There was some concern for possible closed loop obstruction with internal hernia.  Her symptoms eventually improved with conservative management. Differential was enteritis versus mesenteric ischemia versus internal hernia. She continued to improve with broad-spectrum antibiotics. It was recommended that she have outpatient GI follow-up and consideration for capsule endoscopy.    Hemoglobin was stable at discharge. Patient had previous colonoscopy 2015 with small diminutive rectal polyp  Patient did have small elevation of troponins but no chest pain. She underwent catheterization which showed 50% proximal RCA occlusion along with occlusions in the distal LAD. She had echocardiogram which showed ejection fraction 35-40% which compared to previous of 50-55% 2014. She had some mid apical anterior/inferior akinesis. She was started on Lipitor 80 mg daily, aspirin 81 mg daily and  low-dose lisinopril 5 mg daily. However, patient never started her Lipitor or lisinopril as an outpatient. She states she took Lipitor 3 days in a row in the hospital had nausea each time and attributed this to the Lipitor. She is somewhat reluctant to take any statin at this time  Past Medical History:  Diagnosis Date  . Bradycardia    a. 02/2012  . Hyperglycemia   . IBS (irritable bowel syndrome)   . Melanoma ([place])    right arm  . 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 Parkinson's disease    Past Surgical History:  Procedure Laterality Date  . APPENDECTOMY  1952  . COLONOSCOPY    . LEFT HEART CATH AND CORONARY ANGIOGRAPHY N/A 08/03/2016   Procedure: Left Heart Cath and Coronary Angiography;  Surgeon: Leonie Man, MD;  Location: [place] [facility];  Service: Cardiovascular;  Laterality: N/A;  . MELANOMA EXCISION Right 2000   right arm  . PACEMAKER PLACEMENT  05/2012  . PACEMAKER REVISION N/A 03/14/2012   Procedure: PACEMAKER REVISION;  Surgeon: Thompson Grayer, MD;  Location: [facility] CATH LAB;  Service: Cardiovascular;  Laterality: N/A;  . PERMANENT PACEMAKER INSERTION N/A 03/06/2012   Procedure: PERMANENT PACEMAKER INSERTION;  Surgeon: Deboraha Sprang, MD;  Location: [facility] CATH LAB;  Service: Cardiovascular;  Laterality: N/A;  . 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 She has never used smokeless tobacco. She reports that she does not drink alcohol or use drugs. family history includes Alcohol abuse in her father; Diabetes in her brother; Heart attack (age of onset: 7) in her mother; Hypothyroidism in her daughter and daughter. No Known Allergies   Review of Systems  Constitutional: Negative for fatigue and  unexpected weight change.  Eyes: Negative for visual disturbance.  Respiratory: Negative for cough, chest tightness, shortness of breath and wheezing.   Cardiovascular: Negative for chest pain, palpitations and leg swelling.   Gastrointestinal: Negative for abdominal pain, blood in stool, diarrhea, nausea and vomiting.  Endocrine: Negative for polydipsia and polyuria.  Genitourinary: Negative for dysuria.  Neurological: Negative for dizziness, seizures, syncope, weakness, light-headedness and headaches.       Objective:   Physical Exam  Constitutional: She appears well-developed and well-nourished.  HENT:  Mouth/Throat: Oropharynx is clear and moist.  Neck: Neck supple.  Cardiovascular: Normal rate and regular rhythm.   Pulmonary/Chest: Effort normal and breath sounds normal. No respiratory distress. She has no wheezes. She has no rales.  Abdominal: Soft. Bowel sounds are normal. She exhibits no distension and no mass. There is no tenderness. There is no rebound and no guarding.  Musculoskeletal: She exhibits no edema.       Assessment:     #1 recent abdominal pain and bloody stools with differential including enteritis, mesenteric ischemia, internal hernia clinically improved. Recent GI pathogen panels above negative. She's not had any recurrent symptoms since discharge.  No recurrent diarrhea or abdominal pain.  #2 elevated troponins-question demand ischemia. Coronary artery disease by recent catheterization but no critical obstructions  #3 acute systolic heart failure stable this time    Plan:     -She is encouraged to schedule close follow-up with GI and cardiology -We have encouraged her to go ahead start lisinopril 5 mg daily . She appears to be euvolemic and we explained the rationale for ACE inhibitor -We had a long discussion with her and her husband regarding statin use. She still remains reluctant. She is convinced that she had nausea related to Lipitor. She will discuss further with cardiology. We again explained rationale for high-dose statin use -Recheck CBC and comprehensive metabolic panel -Follow-up immediately for any recurrent abdominal pain or bloody stools  [name]  MD [facility]

## 2016-08-11 ENCOUNTER — Telehealth: Payer: Self-pay | Admitting: Family Medicine

## 2016-08-11 NOTE — Progress Notes (Signed)
Cardiology Office Note   Date:  08/13/2016   ID:  Lindsey Bryant, DOB 08-Apr-1941, MRN [id number]  PCP:  [name], MD  Cardiologist:   Jenkins Rouge, MD   Chief Complaint  Patient presents with  . complete heart block  . pt has a pacemaker      History of Present Illness: Lindsey Bryant is a 76 y.o. female who presents for seen post hospital f/u. Seen by Dr Caryl Comes and Nahser in past. Has PPM for CHB.  Admitted earlier this month with diarrhea and abdominal pain. ? IBS and internal hernia. Rx conservatively with no surgery. During hospitalization troponin elevated and echo done Reviewed 07/30/16  EF 35-40% akinesis mid and apical anteroseptal , inferoseptal , anterolateral  Walls  Cath films reviewed:  08/08/16  No significant stenosis less than 50% prox RCA and mid circumflex.    Started on ACE and d/c form hospital        Past Medical History:  Diagnosis Date  . Bradycardia    a. 02/2012  . Hyperglycemia   . IBS (irritable bowel syndrome)   . Melanoma ([place])    right arm  . 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 Parkinson's disease     Past Surgical History:  Procedure Laterality Date  . APPENDECTOMY  1952  . COLONOSCOPY    . LEFT HEART CATH AND CORONARY ANGIOGRAPHY N/A 08/03/2016   Procedure: Left Heart Cath and Coronary Angiography;  Surgeon: Leonie Man, MD;  Location: [facility];  Service: Cardiovascular;  Laterality: N/A;  . MELANOMA EXCISION Right 2000   right arm  . PACEMAKER PLACEMENT  05/2012  . PACEMAKER REVISION N/A 03/14/2012   Procedure: PACEMAKER REVISION;  Surgeon: Thompson Grayer, MD;  Location: [facility] CATH LAB;  Service: Cardiovascular;  Laterality: N/A;  . PERMANENT PACEMAKER INSERTION N/A 03/06/2012   Procedure: PERMANENT PACEMAKER INSERTION;  Surgeon: Deboraha Sprang, MD;  Location: [facility] CATH LAB;  Service: Cardiovascular;  Laterality: N/A;  . 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     Current Outpatient Prescriptions   Medication Sig Dispense Refill  . aspirin EC 81 MG tablet Take 81 mg by mouth at bedtime.    . carbidopa-levodopa (SINEMET IR) 25-100 MG per tablet Take 2 tablets by mouth 4 (four) times daily.     . Cholecalciferol (VITAMIN D3) 2000 units capsule Take 2,000 Units by mouth daily.    . clidinium-chlordiazePOXIDE (LIBRAX) 5-2.5 MG capsule Take 1 capsule by mouth 3 (three) times daily as needed. 60 capsule 0  . docusate sodium (COLACE) 100 MG capsule Take 100 mg by mouth daily as needed for mild constipation.    Marland Kitchen estrogens, conjugated, (PREMARIN) 0.3 MG tablet Take 1 tablet (0.3 mg total) by mouth at bedtime. 90 tablet 3  . medroxyPROGESTERone (PROVERA) 2.5 MG tablet TAKE 1 TABLET (2.5 MG TOTAL) BY MOUTH DAILY. 90 tablet 3  . mirabegron ER (MYRBETRIQ) 25 MG TB24 tablet Take 1 tablet (25 mg total) by mouth daily. 90 tablet 1  . Omega-3 1000 MG CAPS Take 1 capsule by mouth daily.    . polyethylene glycol (MIRALAX / GLYCOLAX) packet Take 17 g by mouth daily as needed.    . tobramycin (TOBREX) 0.3 % ophthalmic ointment Place 1 application into both eyes 3 (three) times daily.      No current facility-administered medications for this visit.     Allergies:   Patient has no known allergies.  Social History:  The patient  reports that she has quit smoking. She has never used smokeless tobacco. She reports that she does not drink alcohol or use drugs.   Family History:  The patient's family history includes Alcohol abuse in her father; Diabetes in her brother; Heart attack (age of onset: 98) in her mother; Hypothyroidism in her daughter and daughter.    ROS:  Please see the history of present illness.   Otherwise, review of systems are positive for none.   All other systems are reviewed and negative.    PHYSICAL EXAM: VS:  BP (!) 100/50   Pulse 75   Ht 5\' 2"  (1.575 m)   Wt 128 lb 12.8 oz (58.4 kg)   SpO2 98%   BMI 23.56 kg/m  , BMI Body mass index is 23.56 kg/m. Affect  appropriate Healthy:  appears stated age 67: normal Neck supple with no adenopathy JVP normal no bruits no thyromegaly Lungs clear with no wheezing and good diaphragmatic motion Heart:  S1/S2 no murmur, no rub, gallop or click PMI normal Abdomen: benighn, BS positve, no tenderness, no AAA no bruit.  No HSM or HJR Distal pulses intact with no bruits No edema Neuro non-focal Skin warm and dry No muscular weakness    EKG:  A sense V pacing    Recent Labs: 07/29/2016: ALT 12; TSH 6.519 08/02/2016: Magnesium 1.9 08/04/2016: BUN <5; Creatinine, Ser 0.74; Hemoglobin 10.7; Platelets 121; Potassium 3.4; Sodium 135    Lipid Panel    Component Value Date/Time   CHOL 199 07/17/2014 1103   TRIG 77.0 07/17/2014 1103   HDL 75.70 07/17/2014 1103   CHOLHDL 3 07/17/2014 1103   VLDL 15.4 07/17/2014 1103   LDLCALC 108 (H) 07/17/2014 1103   LDLDIRECT 111.7 06/05/2012 0900      Wt Readings from Last 3 Encounters:  08/13/16 128 lb 12.8 oz (58.4 kg)  08/10/16 131 lb 3.2 oz (59.5 kg)  08/03/16 141 lb 12.8 oz (64.3 kg)      Other studies Reviewed: Additional studies/ records that were reviewed today include: Notes hospitalization 07/2016 ECG;s echo and cath films .    ASSESSMENT AND PLAN:  1. CAD: No hemodynamically significant blockages cath 08/08/16 continue ASA/Statin 2. PPM:  Normal function f/u with Dr Caryl Comes 3. 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 Parkinson's tired in am before sinemet kicks in f/u neurology  5. IBS/GI:  F/U [name] less diarrhea since d/c from hospital  6. Chol:  Not that high statin causing nausea will D/C   Current medicines are reviewed at length with the patient today.  The patient does not have concerns regarding medicines.  The following changes have been made:  no change  Labs/ tests ordered today include: Echo in 6 months   Orders Placed This Encounter  Procedures  . ECHOCARDIOGRAM COMPLETE      Disposition:   FU with me in 6 months      Signed, Jenkins Rouge, MD  08/13/2016 10:01 AM    [facility] [address], [place], [place]  [address] Phone: [phone]; Fax: [phone]

## 2016-08-11 NOTE — Telephone Encounter (Signed)
Urine cx will probably not be back until at least tomorrow. If she has any burning with urination, go ahead and start Keflex 500 mg po tid for 5 days pending cx.

## 2016-08-11 NOTE — Telephone Encounter (Signed)
Pt states she is more uncomfortable today and hopes the results of her UA are back. Pt states she thinks it could be a vaginal infection the way it feels today.

## 2016-08-11 NOTE — Telephone Encounter (Signed)
FYI - spoke with patient and she is not having any more urinary symptoms,  So Keflex is not needed at this time.  She thinks she may have a yeast infection, so she will try Monistat until she gets her culture results.

## 2016-08-12 LAB — URINE CULTURE

## 2016-08-12 NOTE — Telephone Encounter (Signed)
Pt is calling back requesting ua results. Pt is aware md out of office

## 2016-08-13 ENCOUNTER — Encounter: Payer: Self-pay | Admitting: Cardiovascular Disease

## 2016-08-13 ENCOUNTER — Ambulatory Visit (INDEPENDENT_AMBULATORY_CARE_PROVIDER_SITE_OTHER): Payer: Medicare Other | Admitting: Cardiovascular Disease

## 2016-08-13 ENCOUNTER — Other Ambulatory Visit: Payer: Self-pay | Admitting: Family Medicine

## 2016-08-13 VITALS — BP 100/50 | HR 75 | Ht 62.0 in | Wt 128.8 lb

## 2016-08-13 DIAGNOSIS — I5021 Acute systolic (congestive) heart failure: Secondary | ICD-10-CM | POA: Diagnosis not present

## 2016-08-13 DIAGNOSIS — Z950 Presence of cardiac pacemaker: Secondary | ICD-10-CM | POA: Diagnosis not present

## 2016-08-13 DIAGNOSIS — I442 Atrioventricular block, complete: Secondary | ICD-10-CM | POA: Diagnosis not present

## 2016-08-13 MED ORDER — CIPROFLOXACIN HCL 500 MG PO TABS
500.0000 mg | ORAL_TABLET | Freq: Two times a day (BID) | ORAL | 0 refills | Status: DC
Start: 2016-08-13 — End: 2016-11-09

## 2016-08-13 NOTE — Telephone Encounter (Signed)
Patient is aware of lab result and Rx sent.

## 2016-08-13 NOTE — Patient Instructions (Addendum)

## 2016-09-11 ENCOUNTER — Other Ambulatory Visit: Payer: Self-pay | Admitting: Family Medicine

## 2016-09-27 ENCOUNTER — Encounter: Payer: Self-pay | Admitting: Family Medicine

## 2016-09-27 ENCOUNTER — Ambulatory Visit (INDEPENDENT_AMBULATORY_CARE_PROVIDER_SITE_OTHER): Payer: Medicare Other | Admitting: Family Medicine

## 2016-09-27 VITALS — BP 120/80 | HR 56 | Temp 97.6°F | Wt 124.6 lb

## 2016-09-27 DIAGNOSIS — B9789 Other viral agents as the cause of diseases classified elsewhere: Secondary | ICD-10-CM | POA: Diagnosis not present

## 2016-09-27 DIAGNOSIS — J069 Acute upper respiratory infection, unspecified: Secondary | ICD-10-CM | POA: Diagnosis not present

## 2016-09-27 NOTE — Progress Notes (Signed)
Pre visit review using our clinic review tool, if applicable. No additional management support is needed unless otherwise documented below in the visit note. 

## 2016-09-27 NOTE — Progress Notes (Signed)
Subjective:     Patient ID: Lindsey Bryant, female   DOB: February 23, 1941, 76 y.o.   MRN: [id number]  HPI Patient seen for acute visit. Onset 2 weeks ago of nasal congestion, fatigue, cough. [place] the beach around [place] and someone there had reported viral URI. She's not had any fever. No dyspnea. Overall feels much better. Cough mostly dry. Relatively mild cough. No facial pain.  Past Medical History:  Diagnosis Date  . Bradycardia    a. 02/2012  . Hyperglycemia   . IBS (irritable bowel syndrome)   . Melanoma ([place])    right arm  . 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 Parkinson's disease    Past Surgical History:  Procedure Laterality Date  . APPENDECTOMY  1952  . COLONOSCOPY    . LEFT HEART CATH AND CORONARY ANGIOGRAPHY N/A 08/03/2016   Procedure: Left Heart Cath and Coronary Angiography;  Surgeon: Leonie Man, MD;  Location: [facility];  Service: Cardiovascular;  Laterality: N/A;  . MELANOMA EXCISION Right 2000   right arm  . PACEMAKER PLACEMENT  05/2012  . PACEMAKER REVISION N/A 03/14/2012   Procedure: PACEMAKER REVISION;  Surgeon: Thompson Grayer, MD;  Location: [facility] CATH LAB;  Service: Cardiovascular;  Laterality: N/A;  . PERMANENT PACEMAKER INSERTION N/A 03/06/2012   Procedure: PERMANENT PACEMAKER INSERTION;  Surgeon: Deboraha Sprang, MD;  Location: [facility] CATH LAB;  Service: Cardiovascular;  Laterality: N/A;  . 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 She has never used smokeless tobacco. She reports that she does not drink alcohol or use drugs. family history includes Alcohol abuse in her father; Diabetes in her brother; Heart attack (age of onset: 68) in her mother; Hypothyroidism in her daughter and daughter. No Known Allergies   Review of Systems  Constitutional: Positive for fatigue. Negative for chills and fever.  HENT: Positive for congestion.   Respiratory: Positive for cough.   Neurological: Negative  for headaches.       Objective:   Physical Exam  Constitutional: She appears well-developed and well-nourished. No distress.  HENT:  Right Ear: External ear normal.  Left Ear: External ear normal.  Mouth/Throat: Oropharynx is clear and moist.  Neck: Neck supple.  Cardiovascular: Normal rate and regular rhythm.   Pulmonary/Chest: Effort normal and breath sounds normal. No respiratory distress. She has no wheezes. She has no rales.  Lymphadenopathy:    She has no cervical adenopathy.       Assessment:     Probable viral URI with cough. Appears to be resolving    Plan:     -Reassurance. -Follow-up promptly for any fever or worsening symptoms  [name] MD [facility] at [facility]

## 2016-10-13 ENCOUNTER — Telehealth: Payer: Self-pay | Admitting: Internal Medicine

## 2016-10-13 NOTE — Telephone Encounter (Signed)
She is a Veterinary surgeon patient.  She should schedule a follow up in the office if she is still having GI concerns.  Capsule endo was discussed, but not needed while hospitalized.

## 2016-10-14 ENCOUNTER — Telehealth: Payer: Self-pay | Admitting: Internal Medicine

## 2016-10-14 NOTE — Telephone Encounter (Signed)
OK  We can squeeze her in sooner than next available please

## 2016-10-14 NOTE — Telephone Encounter (Signed)
Dr. [name] will you accept this pt?

## 2016-10-14 NOTE — Telephone Encounter (Signed)
Pt scheduled to see Dr. Cecilie Kicks 11/09/16@9 :15am. Please let pt know about appointment.

## 2016-10-14 NOTE — Telephone Encounter (Signed)
No problem. Thanks 

## 2016-10-14 NOTE — Telephone Encounter (Signed)
Left message for patient notifying her of this. °

## 2016-10-14 NOTE — Telephone Encounter (Signed)
Please advise 

## 2016-10-14 NOTE — Telephone Encounter (Signed)
Patient is requesting to transfer care to Dr.Gessner a message has been sent to Dr.Perry to see if he will accept the switch.

## 2016-10-14 NOTE — Telephone Encounter (Signed)
Dr.Perry will you accept this switch?

## 2016-10-25 ENCOUNTER — Telehealth: Payer: Self-pay | Admitting: Cardiovascular Disease

## 2016-10-25 MED ORDER — LISINOPRIL 5 MG PO TABS: 5.0000 mg | ORAL_TABLET | Freq: Every day | ORAL | 3 refills | Status: DC

## 2016-10-25 NOTE — Telephone Encounter (Signed)
Called patient about Dr. Kyla Balzarine recommendations. Patient verbalized understanding and lisinopril 5 mg by mouth daily will be sent to patient's pharmacy of choice.

## 2016-10-25 NOTE — Telephone Encounter (Signed)
New message    Pt is calling to ask about prescription that was given from the hospital doctor. She said it's for lisinopril 5 mg, take one tab daily. She said the prescription has run out and she isn't sure if she needs more or if she can stop taking it. She said she would like to stop taking it.

## 2016-10-25 NOTE — Telephone Encounter (Signed)
Call in refilll she has a low EF and should be due to have a f/u echo for her EF

## 2016-10-25 NOTE — Telephone Encounter (Signed)
Patient was started on lisinopril 5 mg after hospital visit in March. Lisinopril was taken off patient's list, per patient preference at office visit in March. Patient stated she never stopped taking lisinopril and she has been taking it nonstop since her discharge. Patient wants to know if she can stop taking Lisinopril, but if Dr. Johnsie Cancel thinks she needs to continue, then she would like a refill. Per Dr. Kyla Balzarine last office visit note, he wanted patient to continue ACE. Will forward to Dr. Johnsie Cancel for advisement.

## 2016-11-09 ENCOUNTER — Ambulatory Visit (INDEPENDENT_AMBULATORY_CARE_PROVIDER_SITE_OTHER): Payer: Medicare Other | Admitting: Internal Medicine

## 2016-11-09 ENCOUNTER — Encounter: Payer: Self-pay | Admitting: Internal Medicine

## 2016-11-09 VITALS — BP 110/60 | HR 64 | Ht 62.0 in | Wt 125.8 lb

## 2016-11-09 DIAGNOSIS — K450 Other specified abdominal hernia with obstruction, without gangrene: Secondary | ICD-10-CM

## 2016-11-09 DIAGNOSIS — K582 Mixed irritable bowel syndrome: Secondary | ICD-10-CM

## 2016-11-09 NOTE — Progress Notes (Signed)
Lindsey Bryant 76 y.o. 1941/01/23 [id number]  Assessment & Plan:   Encounter Diagnoses  Name Primary?  . Irritable bowel syndrome with both constipation and diarrhea Yes  . Obstructed internal hernia - resolved    Improved and resolved re: hernia She still has episodic alternating bowel habits but quality of life is ok and this is c/w known IBS.  I will see her prn  She cancelled a trip in advance due to concerns over health issues - I will fill out formsTo see if that age and her claim. I think this was a legitimate thing to do.  I appreciate the opportunity to care for this patient. CC: [name], MD   Subjective:   Chief Complaint:Follow-up after hospitalization for bowel obstruction from infected internal hernia  HPI The patient is here after she had been hospitalized in March. I saw her at that time in consultation, she had what we thought was an obstructed internal hernia that spontaneously improved. She had very severe abdominal pains and diarrhea with some blood. She is completely back to normal now with her episodic alternating bowel habit constipation. She feels her quality of life is stable and fine. She canceled her trip upcoming next G her because she was concerned with her illness and the severity because she did not want to get sick while traveling in [place] on a [place]. No Known Allergies Current Meds  Medication Sig  . aspirin EC 81 MG tablet Take 81 mg by mouth at bedtime.  . carbidopa-levodopa (SINEMET IR) 25-100 MG per tablet Take 2 tablets by mouth 4 (four) times daily.   . Cholecalciferol (VITAMIN D3) 2000 units capsule Take 2,000 Units by mouth daily.  . clidinium-chlordiazePOXIDE (LIBRAX) 5-2.5 MG capsule TAKE ONE CAPSULE BY MOUTH 3 TIMES A DAY AS NEEDED  . docusate sodium (COLACE) 100 MG capsule Take 100 mg by mouth daily as needed for mild constipation.  Marland Kitchen estrogens, conjugated, (PREMARIN) 0.3 MG tablet Take 1 tablet (0.3 mg total) by  mouth at bedtime.  Marland Kitchen lisinopril (PRINIVIL,ZESTRIL) 5 MG tablet Take 1 tablet (5 mg total) by mouth daily.  . medroxyPROGESTERone (PROVERA) 2.5 MG tablet TAKE 1 TABLET (2.5 MG TOTAL) BY MOUTH DAILY.  Marland Kitchen Omega-3 1000 MG CAPS Take 1 capsule by mouth daily.  . polyethylene glycol (MIRALAX / GLYCOLAX) packet Take 17 g by mouth daily as needed.  . Probiotic Product (PROBIOTIC PO) Take 1 capsule by mouth daily.  Marland Kitchen tobramycin (TOBREX) 0.3 % ophthalmic ointment Place 1 application into both eyes 3 (three) times daily.    Past Medical History:  Diagnosis Date  . Bradycardia    a. 02/2012  . Hyperglycemia   . IBS (irritable bowel syndrome)   . Melanoma ([place])    right arm  . 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 Parkinson's disease    Past Surgical History:  Procedure Laterality Date  . APPENDECTOMY  1952  . COLONOSCOPY    . LEFT HEART CATH AND CORONARY ANGIOGRAPHY N/A 08/03/2016   Procedure: Left Heart Cath and Coronary Angiography;  Surgeon: Leonie Man, MD;  Location: [facility];  Service: Cardiovascular;  Laterality: N/A;  . MELANOMA EXCISION Right 2000   right arm  . PACEMAKER PLACEMENT  05/2012  . PACEMAKER REVISION N/A 03/14/2012   Procedure: PACEMAKER REVISION;  Surgeon: Thompson Grayer, MD;  Location: [facility] C[facility] LAB;  Service: Cardiovascular;  Laterality: N/A;  . PERMANENT PACEMAKER INSERTION N/A 03/06/2012   Procedure: PERMANENT PACEMAKER INSERTION;  Surgeon:  Deboraha Sprang, MD;  Location: [facility] CATH LAB;  Service: Cardiovascular;  Laterality: N/A;  . 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  Review of Systems As above. She has regained weight eating well. She was afraid to eat for a while but feels back to normal.  Objective:   Physical Exam BP 110/60   Pulse 64   Ht 5\' 2"  (1.575 m)   Wt 125 lb 12.8 oz (57.1 kg)   BMI 23.01 kg/m  Eyes anicteric abd soft and NT   15 minutes time spent with patient > half in counseling coordination of care

## 2016-11-09 NOTE — Patient Instructions (Signed)
   Glad things are better and hope they stay that way.  I will fill out the forms when I can and we will let you know when you can get them.  I appreciate the opportunity to care for you. Gatha Mayer, MD, [facility]

## 2016-11-18 ENCOUNTER — Other Ambulatory Visit: Payer: Self-pay | Admitting: Family Medicine

## 2016-11-18 MED ORDER — ESTROGENS CONJUGATED 0.3 MG PO TABS: 0.3000 mg | ORAL_TABLET | Freq: Every day | ORAL | 0 refills | Status: DC

## 2016-11-18 NOTE — Telephone Encounter (Signed)
Rx done. 

## 2016-11-18 NOTE — Telephone Encounter (Signed)
Pt needs refill on premarin 0.3 mg #90 w/[facility] [address] Pt is aware md out of office today

## 2016-11-19 ENCOUNTER — Other Ambulatory Visit: Payer: Self-pay | Admitting: Family Medicine

## 2016-11-23 ENCOUNTER — Telehealth: Payer: Self-pay

## 2016-11-23 NOTE — Telephone Encounter (Signed)
Pa denied, patient has to try Femring first.

## 2016-11-23 NOTE — Telephone Encounter (Signed)
Received PA request for Premarin. PA submitted & is pending. Key: [id number]

## 2016-11-25 NOTE — Telephone Encounter (Signed)
An appointment made

## 2016-11-25 NOTE — Telephone Encounter (Signed)
Bring pt in to discuss.

## 2016-11-29 ENCOUNTER — Encounter: Payer: Self-pay | Admitting: Family Medicine

## 2016-11-29 ENCOUNTER — Ambulatory Visit (INDEPENDENT_AMBULATORY_CARE_PROVIDER_SITE_OTHER): Payer: Medicare Other | Admitting: Family Medicine

## 2016-11-29 VITALS — BP 102/60 | HR 75 | Temp 98.2°F | Wt 122.7 lb

## 2016-11-29 DIAGNOSIS — N951 Menopausal and female climacteric states: Secondary | ICD-10-CM | POA: Diagnosis not present

## 2016-11-29 MED ORDER — ESTROGENS CONJUGATED 0.3 MG PO TABS: 0.3000 mg | ORAL_TABLET | Freq: Every day | ORAL | 0 refills | Status: DC

## 2016-11-29 NOTE — Patient Instructions (Signed)
Go ahead and taper off the Premarin and Provera- as discussed.

## 2016-11-29 NOTE — Progress Notes (Signed)
Subjective:     Patient ID: Lindsey Bryant, female   DOB: [birth year], 76 y.o.   MRN: [id number]  HPI   Patient here to discuss postmenopausal hormonal replacement. She has apparently been on low-dose Premarin and Provera since entering menopause. She tried several times to stop but has had some emotional issues with depression and increased fatigue. Recently had refusal from her insurance coverage for Premarin. They stated that she had to fail Femring first. She is very reluctant to stop Premarin. She does stay fairly active and takes calcium and vitamin D regularly.  She has past history of CAD  Past Medical History:  Diagnosis Date  . Acute systolic heart failure ([place])   . Bradycardia    a. 02/2012  . Hyperglycemia   . IBS (irritable bowel syndrome)   . Melanoma ([place])    right arm  . NSTEMI (non-ST elevated myocardial infarction) ([place])   . 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 Parkinson's disease    Past Surgical History:  Procedure Laterality Date  . APPENDECTOMY  1952  . COLONOSCOPY    . LEFT HEART CATH AND CORONARY ANGIOGRAPHY N/A 08/03/2016   Procedure: Left Heart Cath and Coronary Angiography;  Surgeon: Leonie Man, MD;  Location: [facility];  Service: Cardiovascular;  Laterality: N/A;  . MELANOMA EXCISION Right 2000   right arm  . PACEMAKER PLACEMENT  05/2012  . PACEMAKER REVISION N/A 03/14/2012   Procedure: PACEMAKER REVISION;  Surgeon: Thompson Grayer, MD;  Location: [facility] CATH LAB;  Service: Cardiovascular;  Laterality: N/A;  . PERMANENT PACEMAKER INSERTION N/A 03/06/2012   Procedure: PERMANENT PACEMAKER INSERTION;  Surgeon: Deboraha Sprang, MD;  Location: [facility] CATH LAB;  Service: Cardiovascular;  Laterality: N/A;  . 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 She has never used smokeless tobacco. She reports that she does not drink alcohol or use drugs. family history includes Alcohol abuse in her father; Diabetes in her  brother; Heart attack (age of onset: 36) in her mother; Hypothyroidism in her daughter and daughter. No Known Allergies   Review of Systems  Constitutional: Negative for fatigue.  Eyes: Negative for visual disturbance.  Respiratory: Negative for cough, chest tightness, shortness of breath and wheezing.   Cardiovascular: Negative for chest pain, palpitations and leg swelling.  Neurological: Negative for dizziness, seizures, syncope, weakness, light-headedness and headaches.       Objective:   Physical Exam  Constitutional: She appears well-developed and well-nourished.  Eyes: Pupils are equal, round, and reactive to light.  Neck: Neck supple. No JVD present. No thyromegaly present.  Cardiovascular: Normal rate and regular rhythm.  Exam reveals no gallop.   Pulmonary/Chest: Effort normal and breath sounds normal. No respiratory distress. She has no wheezes. She has no rales.  Musculoskeletal: She exhibits no edema.  Neurological: She is alert.       Assessment:     Postmenopausal.  She has been on long-term hormone replacement as above    Plan:     -We recommend she try to discontinue Premarin and Provera at this time. We discussed previous findings from large-scale studies. Even though she is getting some benefit for bone density and possibly symptom improvement we explained increased risk of breast cancer, pro-coagulation effects, and increased risk of stroke in patients on Premarin and Provera.  Her cardiac history is another reason for her to come off hormonal replacement at this time.  Discontinue and  be in touch if she has any concerning symptoms  [name] MD [facility] at [facility]'

## 2016-12-03 ENCOUNTER — Telehealth: Payer: Self-pay

## 2016-12-03 NOTE — Telephone Encounter (Signed)
Dr [name] has filled out a AIG Claim form and original has been put up front for pick up.  Left patient voice mail that it is ready on # [phone].  Her other #'s not working or full voice mail when I tried to leave a message.  Copy sent to be scanned into epic.

## 2016-12-03 NOTE — Telephone Encounter (Signed)
Lindsey Bryant came and picked up her form.

## 2017-01-03 ENCOUNTER — Encounter: Payer: Self-pay | Admitting: Internal Medicine

## 2017-01-03 ENCOUNTER — Ambulatory Visit (INDEPENDENT_AMBULATORY_CARE_PROVIDER_SITE_OTHER): Payer: Medicare Other | Admitting: Internal Medicine

## 2017-01-03 VITALS — BP 114/64 | HR 71 | Ht 62.0 in | Wt 123.0 lb

## 2017-01-03 DIAGNOSIS — Z950 Presence of cardiac pacemaker: Secondary | ICD-10-CM | POA: Diagnosis not present

## 2017-01-03 DIAGNOSIS — I442 Atrioventricular block, complete: Secondary | ICD-10-CM

## 2017-01-03 DIAGNOSIS — I5021 Acute systolic (congestive) heart failure: Secondary | ICD-10-CM | POA: Diagnosis not present

## 2017-01-03 DIAGNOSIS — Z79899 Other long term (current) drug therapy: Secondary | ICD-10-CM

## 2017-01-03 LAB — CUP PACEART INCLINIC DEVICE CHECK
Battery Impedance: 275 Ohm
Brady Statistic AP VP Percent: 29 %
Brady Statistic AP VS Percent: 0 %
Brady Statistic AS VP Percent: 71 %
Brady Statistic AS VS Percent: 0 %
Implantable Lead Implant Date: 20131014
Implantable Lead Implant Date: 20131014
Implantable Lead Location: 753860
Implantable Lead Model: 5076
Implantable Lead Model: 5076
Lead Channel Impedance Value: 404 Ohm
Lead Channel Impedance Value: 613 Ohm
Lead Channel Pacing Threshold Amplitude: 0.5 V
Lead Channel Pacing Threshold Pulse Width: 0.4 ms
Lead Channel Sensing Intrinsic Amplitude: 2.8 mV
Lead Channel Setting Sensing Sensitivity: 4 mV
MDC IDC LEAD LOCATION: 753859
MDC IDC MSMT BATTERY REMAINING LONGEVITY: 102 mo
MDC IDC MSMT BATTERY VOLTAGE: 2.79 V
MDC IDC MSMT LEADCHNL RA PACING THRESHOLD AMPLITUDE: 0.5 V
MDC IDC MSMT LEADCHNL RV PACING THRESHOLD PULSEWIDTH: 0.4 ms
MDC IDC PG IMPLANT DT: 20131014
MDC IDC SESS DTM: 20180813180949
MDC IDC SET LEADCHNL RA PACING AMPLITUDE: 2 V
MDC IDC SET LEADCHNL RV PACING AMPLITUDE: 2.5 V
MDC IDC SET LEADCHNL RV PACING PULSEWIDTH: 0.4 ms

## 2017-01-03 NOTE — Patient Instructions (Addendum)
Medication Instructions:  Your physician recommends that you continue on your current medications as directed. Please refer to the Current Medication list given to you today.  --- If you need a refill on your cardiac medications before your next appointment, please call your pharmacy. ---  Labwork: Today: BMET & CBC w/ diff  Testing/Procedures: None ordered  Follow-Up: Remote monitoring is used to monitor your Pacemaker of ICD from home. This monitoring reduces the number of office visits required to check your device to one time per year. It allows [place] to keep an eye on the functioning of your device to ensure it is working properly. You are scheduled for a device check from home on 04/04/2017. You may send your transmission at any time that day. If you have a wireless device, the transmission will be sent automatically. After your physician reviews your transmission, you will receive a postcard with your next transmission date.  Your physician wants you to follow-up in: 1 year with Dr. Caryl Comes.  You will receive a reminder letter in the mail two months in advance. If you don't receive a letter, please call our office to schedule the follow-up appointment.   Thank you for choosing [facility]!!

## 2017-01-03 NOTE — Progress Notes (Signed)
Patient Care Team: [name], MD as PCP - General (Family Medicine) Sherlyn Lees, MD as Referring Physician (Neurology)   HPI  Lindsey Bryant is a 76 y.o. 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    She was hospitalized 3/18. She was found to have left ventricular dysfunction and underwent catheterization  She's feeling better. She has modest fatigue and mild shortness of breath but no edema or chest pain. No palpitations or syncope   Her GI symptoms have resolved    DATE TEST    5/14    Echo   EF 55 %   3/18 Cath  Non Obstructive CAs  3/18 Echo EF 30-35         Past Medical History:  Diagnosis Date  . Acute systolic heart failure ([place])   . Bradycardia    a. 02/2012  . Hyperglycemia   . IBS (irritable bowel syndrome)   . Melanoma ([place])    right arm  . NSTEMI (non-ST elevated myocardial infarction) ([place])   . 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 Parkinson's disease     Past Surgical History:  Procedure Laterality Date  . APPENDECTOMY  1952  . COLONOSCOPY    . LEFT HEART CATH AND CORONARY ANGIOGRAPHY N/A 08/03/2016   Procedure: Left Heart Cath and Coronary Angiography;  Surgeon: Leonie Man, MD;  Location: [place] CV [facility];  Service: Cardiovascular;  Laterality: N/A;  . MELANOMA EXCISION Right 2000   right arm  . PACEMAKER PLACEMENT  05/2012  . PACEMAKER REVISION N/A 03/14/2012   Procedure: PACEMAKER REVISION;  Surgeon: Thompson Grayer, MD;  Location: [facility] CATH LAB;  Service: Cardiovascular;  Laterality: N/A;  . PERMANENT PACEMAKER INSERTION N/A 03/06/2012   Procedure: PERMANENT PACEMAKER INSERTION;  Surgeon: Deboraha Sprang, MD;  Location: [facility] CATH LAB;  Service: Cardiovascular;  Laterality: N/A;  . 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 aspirin EC 81 MG tablet Take 81 mg by mouth  at bedtime.    . carbidopa-levodopa (SINEMET IR) 25-100 MG per tablet Take 2 tablets by mouth 4 (four) times daily.     . Cholecalciferol (VITAMIN D3) 2000 units capsule Take 2,000 Units by mouth daily.    . clidinium-chlordiazePOXIDE (LIBRAX) 5-2.5 MG capsule TAKE ONE CAPSULE BY MOUTH 3 TIMES A DAY AS NEEDED 180 capsule 0  . docusate sodium (COLACE) 100 MG capsule Take 100 mg by mouth daily as needed for mild constipation.    Marland Kitchen estrogens, conjugated, (PREMARIN) 0.3 MG tablet Take 1 tablet (0.3 mg total) by mouth at bedtime. Take daily for 21 days then do not take for 7 days. 30 tablet 0  . lisinopril (PRINIVIL,ZESTRIL) 5 MG tablet Take 1 tablet (5 mg total) by mouth daily. 90 tablet 3  . Omega-3 1000 MG CAPS Take 1 capsule by mouth daily.    . polyethylene glycol (MIRALAX / GLYCOLAX) packet Take 17 g by mouth daily as needed.    . Probiotic Product (PROBIOTIC PO) Take 1 capsule by mouth daily.    Marland Kitchen tobramycin (TOBREX) 0.3 % ophthalmic ointment Place 1 application into both eyes 3 (three) times daily.      No current facility-administered medications for this visit.     No Known Allergies  Review of Systems negative except from HPI and PMH  Physical Exam  BP 114/64   Pulse 71   Ht 5\' 2"  (1.575 m)   Wt 123 lb (55.8 kg)   SpO2 99%   BMI 22.50 kg/m  Well developed and nourished in no acute distress HENT normal Neck supple  Clear Device pocket well healed; without hematoma or erythema.  There is no tethering  Regular rate and rhythm, no murmurs or gallops Abd-soft with active BS No Clubbing cyanosis edema Skin-warm and dry A & Oriented  Grossly normal sensory and motor function tremor  ECG demonstrates sinus rhythm Psynchronous pacing Intervals 26/15/44   Assessment and  Plan  Complete heart block  Narrow escape  Dyspnea on Exertion   Fatigue  Hypokalemia new  Anemia persistent   Pacemaker Medtronic The patient's device was interrogated.  The information was reviewed.  Her AV delay for more physiologic interaction   I wonder whether her left ventricular dysfunction with pacemaker mediated. In the event that it is not improved when she sees Dr. 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  If persists may benefir from iron supplementation

## 2017-01-04 LAB — BASIC METABOLIC PANEL
BUN/Creatinine Ratio: 17 (ref 12–28)
BUN: 13 mg/dL (ref 8–27)
CALCIUM: 9.2 mg/dL (ref 8.7–10.3)
CO2: 22 mmol/L (ref 20–29)
CREATININE: 0.75 mg/dL (ref 0.57–1.00)
Chloride: 102 mmol/L (ref 96–106)
GFR calc Af Amer: 90 mL/min/{1.73_m2} (ref >59)
GFR, EST NON AFRICAN AMERICAN: 78 mL/min/{1.73_m2} (ref >59)
Glucose: 84 mg/dL (ref 65–99)
Potassium: 4.6 mmol/L (ref 3.5–5.2)
Sodium: 141 mmol/L (ref 134–144)

## 2017-01-04 LAB — CBC WITH DIFFERENTIAL/PLATELET
BASOS: 0 %
Basophils Absolute: 0 10*3/uL (ref 0.0–0.2)
EOS (ABSOLUTE): 0.1 10*3/uL (ref 0.0–0.4)
EOS: 2 %
Hematocrit: 38.5 % (ref 34.0–46.6)
Hemoglobin: 13 g/dL (ref 11.1–15.9)
IMMATURE GRANS (ABS): 0 10*3/uL (ref 0.0–0.1)
IMMATURE GRANULOCYTES: 0 %
LYMPHS: 29 %
Lymphocytes Absolute: 2.1 10*3/uL (ref 0.7–3.1)
MCH: 33.3 pg — ABNORMAL HIGH (ref 26.6–33.0)
MCHC: 33.8 g/dL (ref 31.5–35.7)
MCV: 99 fL — ABNORMAL HIGH (ref 79–97)
MONOCYTES: 7 %
Monocytes Absolute: 0.5 10*3/uL (ref 0.1–0.9)
NEUTROS PCT: 62 %
Neutrophils Absolute: 4.6 10*3/uL (ref 1.4–7.0)
PLATELETS: 181 10*3/uL (ref 150–379)
RBC: 3.9 x10E6/uL (ref 3.77–5.28)
RDW: 13.7 % (ref 12.3–15.4)
WBC: 7.4 10*3/uL (ref 3.4–10.8)

## 2017-01-10 ENCOUNTER — Telehealth: Payer: Self-pay | Admitting: *Deleted

## 2017-01-10 NOTE — Telephone Encounter (Signed)
Patient informed. 

## 2017-01-10 NOTE — Telephone Encounter (Signed)
-----   Message from Deboraha Sprang, MD sent at 01/08/2017 11:39 AM EDT ----- Please Inform Patient that labs are normal x   Thanks

## 2017-01-28 ENCOUNTER — Telehealth: Payer: Self-pay | Admitting: Family Medicine

## 2017-01-28 MED ORDER — ESTROGENS CONJUGATED 0.3 MG PO TABS: 0.3000 mg | ORAL_TABLET | Freq: Every day | ORAL | 1 refills | Status: DC

## 2017-01-28 NOTE — Telephone Encounter (Signed)
° ° ° °  Pt call to ask for the below med to be faxed to the below pharmacy    estrogens, conjugated, (PREMARIN) 0.3 MG tablet    [facility]   [facility] [phone]

## 2017-01-28 NOTE — Telephone Encounter (Signed)
Rx ready for patient to pick up.  Patient is aware that she will have to mail/fax.

## 2017-02-01 ENCOUNTER — Ambulatory Visit (HOSPITAL_COMMUNITY): Payer: Medicare Other | Attending: Cardiology

## 2017-02-01 ENCOUNTER — Other Ambulatory Visit: Payer: Self-pay

## 2017-02-01 DIAGNOSIS — Z87891 Personal history of nicotine dependence: Secondary | ICD-10-CM | POA: Diagnosis not present

## 2017-02-01 DIAGNOSIS — I252 Old myocardial infarction: Secondary | ICD-10-CM | POA: Diagnosis not present

## 2017-02-01 DIAGNOSIS — I5021 Acute systolic (congestive) heart failure: Secondary | ICD-10-CM | POA: Diagnosis not present

## 2017-02-01 DIAGNOSIS — R001 Bradycardia, unspecified: Secondary | ICD-10-CM | POA: Diagnosis not present

## 2017-02-01 DIAGNOSIS — Z8249 Family history of ischemic heart disease and other diseases of the circulatory system: Secondary | ICD-10-CM | POA: Diagnosis not present

## 2017-02-01 DIAGNOSIS — I214 Non-ST elevation (NSTEMI) myocardial infarction: Secondary | ICD-10-CM | POA: Insufficient documentation

## 2017-02-01 DIAGNOSIS — I442 Atrioventricular block, complete: Secondary | ICD-10-CM

## 2017-02-01 DIAGNOSIS — I517 Cardiomegaly: Secondary | ICD-10-CM | POA: Diagnosis not present

## 2017-02-03 ENCOUNTER — Encounter: Payer: Self-pay | Admitting: Internal Medicine

## 2017-02-03 ENCOUNTER — Ambulatory Visit (INDEPENDENT_AMBULATORY_CARE_PROVIDER_SITE_OTHER): Payer: Medicare Other | Admitting: Internal Medicine

## 2017-02-03 VITALS — BP 90/50 | HR 80 | Ht 62.0 in | Wt 124.0 lb

## 2017-02-03 DIAGNOSIS — Z950 Presence of cardiac pacemaker: Secondary | ICD-10-CM

## 2017-02-03 DIAGNOSIS — I442 Atrioventricular block, complete: Secondary | ICD-10-CM | POA: Diagnosis not present

## 2017-02-03 NOTE — Patient Instructions (Addendum)
Medication Instructions:  Your physician recommends that you continue on your current medications as directed. Please refer to the Current Medication list given to you today.   Labwork: None ordered  Testing/Procedures: None ordered  Follow-Up: Remote monitoring is used to monitor your Pacemaker from home. This monitoring reduces the number of office visits required to check your device to one time per year. It allows [place] to keep an eye on the functioning of your device to ensure it is working properly. You are scheduled for a device check from home on 04/04/17. You may send your transmission at any time that day. If you have a wireless device, the transmission will be sent automatically. After your physician reviews your transmission, you will receive a postcard with your next transmission date.    Your physician wants you to follow-up in: August 2019 with Dr. Caryl Comes. You will receive a reminder letter in the mail two months in advance. If you don't receive a letter, please call our office to schedule the follow-up appointment.   Any Other Special Instructions Will Be Listed Below (If Applicable).     If you need a refill on your cardiac medications before your next appointment, please call your pharmacy.

## 2017-02-03 NOTE — Progress Notes (Signed)
Patient Care Team: [name], MD as PCP - General (Family Medicine) Sherlyn Lees, MD as Referring Physician (Neurology)   HPI  Lindsey Bryant is a 76 y.o. 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    She was hospitalized 3/18. She was found to have left ventricular dysfunction and underwent catheterization  Her sob better; no edema   Post prandial fatigue and sleepiness, not sure if sinimet orr post prandial hypotension     DATE TEST    5/14    Echo   EF 55 %   3/18 Cath  Non Obstructive CAs  3/18 Echo EF 30-35   9/18 Echo EF 45-50%        Date Cr K  8/18 0.75 4.6            Past Medical History:  Diagnosis Date  . Acute systolic heart failure ([place])   . Bradycardia    a. 02/2012  . Hyperglycemia   . IBS (irritable bowel syndrome)   . Melanoma ([place])    right arm  . NSTEMI (non-ST elevated myocardial infarction) ([place])   . 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 Parkinson's disease     Past Surgical History:  Procedure Laterality Date  . APPENDECTOMY  1952  . COLONOSCOPY    . LEFT HEART CATH AND CORONARY ANGIOGRAPHY N/A 08/03/2016   Procedure: Left Heart Cath and Coronary Angiography;  Surgeon: Leonie Man, MD;  Location: [place] CV LAB;  Service: Cardiovascular;  Laterality: N/A;  . MELANOMA EXCISION Right 2000   right arm  . PACEMAKER PLACEMENT  05/2012  . PACEMAKER REVISION N/A 03/14/2012   Procedure: PACEMAKER REVISION;  Surgeon: Thompson Grayer, MD;  Location: [facility] CATH LAB;  Service: Cardiovascular;  Laterality: N/A;  . PERMANENT PACEMAKER INSERTION N/A 03/06/2012   Procedure: PERMANENT PACEMAKER INSERTION;  Surgeon: Deboraha Sprang, MD;  Location: [facility] CATH LAB;  Service: Cardiovascular;  Laterality: N/A;  . 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 aspirin EC 81 MG tablet  Take 81 mg by mouth at bedtime.    . carbidopa-levodopa (SINEMET IR) 25-100 MG per tablet Take 2 tablets by mouth 4 (four) times daily.     . Cholecalciferol (VITAMIN D3) 2000 units capsule Take 2,000 Units by mouth daily.    . clidinium-chlordiazePOXIDE (LIBRAX) 5-2.5 MG capsule TAKE ONE CAPSULE BY MOUTH 3 TIMES A DAY AS NEEDED 180 capsule 0  . docusate sodium (COLACE) 100 MG capsule Take 100 mg by mouth daily as needed for mild constipation.    Marland Kitchen estrogens, conjugated, (PREMARIN) 0.3 MG tablet Take 1 tablet (0.3 mg total) by mouth at bedtime. Take daily for 21 days then do not take for 7 days. 90 tablet 1  . lisinopril (PRINIVIL,ZESTRIL) 5 MG tablet Take 1 tablet (5 mg total) by mouth daily. 90 tablet 3  . Omega-3 1000 MG CAPS Take 1 capsule by mouth daily.    . polyethylene glycol (MIRALAX / GLYCOLAX) packet Take 17 g by mouth daily as needed.    . Probiotic Product (PROBIOTIC PO) Take 1 capsule by mouth daily.    Marland Kitchen tobramycin (TOBREX) 0.3 % ophthalmic ointment Place 1 application into both eyes 3 (three) times daily.      No current facility-administered medications for this visit.     No  Known Allergies  Review of Systems negative except from HPI and PMH  Physical Exam BP (!) 90/50   Pulse 80   Ht 5\' 2"  (1.575 m)   Wt 124 lb (56.2 kg)   SpO2 97%   BMI 22.68 kg/m  Well developed and nourished in no acute distress HENT normal Neck supple with JVP-flat Carotids brisk and full without bruits Clear Regular rate and rhythm, no murmurs or gallops Abd-soft with active BS without hepatomegaly No Clubbing cyanosis edema Skin-warm and dry A & Oriented  Grossly normal sensory and motor function  tremor  ECG demonstrates sinus P-synchronous/ AV  pacing 22/16/42  Assessment and  Plan  Complete heart block  Narrow escape  NICM  Post prandial Fatigue   Pacemaker Medtronic The patient's device was interrogated.  The information was reviewed. Her AV delay for more physiologic  interaction    Cardiomyopathy is much improved-- continue medical   Not sure BP will tolerate more meds  In the context of her parkinsons, autonomic/orthostatic issues are concerns, and this may be contributing to post prandial symptoms  Will see next yr for device followup

## 2017-02-10 ENCOUNTER — Encounter: Payer: Self-pay | Admitting: Family Medicine

## 2017-04-04 ENCOUNTER — Ambulatory Visit (INDEPENDENT_AMBULATORY_CARE_PROVIDER_SITE_OTHER): Payer: Medicare Other | Admitting: *Deleted

## 2017-04-04 DIAGNOSIS — I442 Atrioventricular block, complete: Secondary | ICD-10-CM

## 2017-04-04 NOTE — Progress Notes (Signed)
Remote pacemaker transmission.   

## 2017-04-05 LAB — CUP PACEART REMOTE DEVICE CHECK
Battery Remaining Longevity: 99 mo
Battery Voltage: 2.79 V
Implantable Lead Implant Date: 20131014
Implantable Lead Location: 753860
Implantable Lead Model: 5076
Implantable Pulse Generator Implant Date: 20131014
Lead Channel Pacing Threshold Amplitude: 0.375 V
Lead Channel Pacing Threshold Pulse Width: 0.4 ms
Lead Channel Setting Pacing Amplitude: 2 V
Lead Channel Setting Pacing Pulse Width: 0.4 ms
Lead Channel Setting Sensing Sensitivity: 4 mV
MDC IDC LEAD IMPLANT DT: 20131014
MDC IDC LEAD LOCATION: 753859
MDC IDC MSMT BATTERY IMPEDANCE: 299 Ohm
MDC IDC MSMT LEADCHNL RA IMPEDANCE VALUE: 394 Ohm
MDC IDC MSMT LEADCHNL RA PACING THRESHOLD PULSEWIDTH: 0.4 ms
MDC IDC MSMT LEADCHNL RV IMPEDANCE VALUE: 616 Ohm
MDC IDC MSMT LEADCHNL RV PACING THRESHOLD AMPLITUDE: 0.5 V
MDC IDC SESS DTM: 20181112140912
MDC IDC SET LEADCHNL RV PACING AMPLITUDE: 2.5 V
MDC IDC STAT BRADY AP VP PERCENT: 32 %
MDC IDC STAT BRADY AP VS PERCENT: 0 %
MDC IDC STAT BRADY AS VP PERCENT: 68 %
MDC IDC STAT BRADY AS VS PERCENT: 0 %

## 2017-04-08 ENCOUNTER — Encounter: Payer: Self-pay | Admitting: Cardiology

## 2017-05-05 ENCOUNTER — Other Ambulatory Visit: Payer: Self-pay | Admitting: Family Medicine

## 2017-05-05 ENCOUNTER — Telehealth: Payer: Self-pay | Admitting: Family Medicine

## 2017-05-05 NOTE — Telephone Encounter (Signed)
We took her off this at 11-29-16 visit.

## 2017-05-05 NOTE — Telephone Encounter (Signed)
Copied from [place]. Topic: Quick Communication - Rx Refill/Question >> May 05, 2017  9:59 AM Lennox Solders wrote: Has the patient contacted their pharmacy? yes  (Agent: If no, request that the patient contact the pharmacy for the refill.) Pt would like to know the reason medroxyprogesterone was denial. Pt states she still takes this med. Pt also said they discuss her stopping med in  July and per pt they decide she will continue to take med. Pt is getting premarin [place]   [facility]erred [facility] phone [facility] [address]  Agent: Please be advised that RX refills may take up to 3 business days. We ask that you follow-up with your pharmacy.

## 2017-05-05 NOTE — Telephone Encounter (Signed)
Rx denied

## 2017-05-05 NOTE — Telephone Encounter (Signed)
For reasons outlined in note 11-29-16- hx of CAD, higher stroke risk, higher risk of clotting issues, slightly higher risk breast cancer- for all these reasons I think she should consider d/c the estrogen and provera.

## 2017-05-06 NOTE — Telephone Encounter (Signed)
ok 

## 2017-05-06 NOTE — Telephone Encounter (Signed)
Patient states she has 3 months left of the premarin and would like 3 months of Provera and then she will stop.  She states she has been taking it for 30 year.  Okay to fill?

## 2017-05-09 MED ORDER — ESTROGENS CONJUGATED 0.3 MG PO TABS: 0.3000 mg | ORAL_TABLET | Freq: Every day | ORAL | 1 refills | Status: DC

## 2017-05-09 MED ORDER — MEDROXYPROGESTERONE ACETATE 2.5 MG PO TABS: 2.5000 mg | ORAL_TABLET | Freq: Every day | ORAL | 0 refills | Status: DC

## 2017-05-09 NOTE — Addendum Note (Signed)
Addended by: Westley Hummer B on: 05/09/2017 11:21 AM   Modules accepted: Orders

## 2017-05-09 NOTE — Telephone Encounter (Signed)
Rx sent 

## 2017-05-09 NOTE — Telephone Encounter (Signed)
Pt following up on this med request. Hopes it will be called in today.  [facility] [id number], [place] [phone] (Phone) [phone] (Fax)

## 2017-07-04 ENCOUNTER — Telehealth: Payer: Self-pay | Admitting: Cardiology

## 2017-07-04 ENCOUNTER — Encounter: Payer: Medicare Other | Admitting: *Deleted

## 2017-07-04 NOTE — Telephone Encounter (Signed)
LMOVM reminding pt to send remote transmission.   

## 2017-07-06 ENCOUNTER — Encounter: Payer: Self-pay | Admitting: Cardiology

## 2017-07-21 ENCOUNTER — Ambulatory Visit (INDEPENDENT_AMBULATORY_CARE_PROVIDER_SITE_OTHER): Payer: Medicare Other | Admitting: *Deleted

## 2017-07-21 DIAGNOSIS — I442 Atrioventricular block, complete: Secondary | ICD-10-CM | POA: Diagnosis not present

## 2017-07-21 NOTE — Progress Notes (Signed)
Remote pacemaker transmission.   

## 2017-07-22 ENCOUNTER — Encounter: Payer: Self-pay | Admitting: Cardiology

## 2017-07-22 HISTORY — PX: EYE SURGERY: SHX253

## 2017-08-06 ENCOUNTER — Other Ambulatory Visit: Payer: Self-pay | Admitting: Family Medicine

## 2017-08-08 NOTE — Telephone Encounter (Signed)
Refill for 3 months.  Make sure she has follow up within 3 months for follow up.

## 2017-08-09 LAB — CUP PACEART REMOTE DEVICE CHECK
Battery Voltage: 2.79 V
Brady Statistic AS VP Percent: 67 %
Brady Statistic AS VS Percent: 0 %
Implantable Lead Implant Date: 20131014
Implantable Lead Implant Date: 20131014
Implantable Lead Location: 753860
Implantable Lead Model: 5076
Implantable Pulse Generator Implant Date: 20131014
Lead Channel Pacing Threshold Amplitude: 0.375 V
Lead Channel Pacing Threshold Pulse Width: 0.4 ms
Lead Channel Pacing Threshold Pulse Width: 0.4 ms
Lead Channel Setting Pacing Amplitude: 2 V
Lead Channel Setting Pacing Pulse Width: 0.4 ms
MDC IDC LEAD LOCATION: 753859
MDC IDC MSMT BATTERY IMPEDANCE: 323 Ohm
MDC IDC MSMT BATTERY REMAINING LONGEVITY: 97 mo
MDC IDC MSMT LEADCHNL RA IMPEDANCE VALUE: 394 Ohm
MDC IDC MSMT LEADCHNL RV IMPEDANCE VALUE: 628 Ohm
MDC IDC MSMT LEADCHNL RV PACING THRESHOLD AMPLITUDE: 0.5 V
MDC IDC SESS DTM: 20190228141624
MDC IDC SET LEADCHNL RV PACING AMPLITUDE: 2.5 V
MDC IDC SET LEADCHNL RV SENSING SENSITIVITY: 4 mV
MDC IDC STAT BRADY AP VP PERCENT: 33 %
MDC IDC STAT BRADY AP VS PERCENT: 0 %

## 2017-09-28 ENCOUNTER — Telehealth: Payer: Self-pay | Admitting: Internal Medicine

## 2017-09-28 NOTE — Telephone Encounter (Signed)
° °  [facility] Pre-operative Risk Assessment    Request for surgical clearance:  1. What type of surgery is being performed? Cataract Extraction w/Intraocular Lens implantation of the RIGHT EYE, followed by LEFT EYE  2. When is this surgery scheduled? 10/10/2017 and 10/24/2017  3. What type of clearance is required (medical clearance vs. Pharmacy clearance to hold med vs. Both)? Medical  4. Are there any medications that need to be held prior to surgery and how long? Pt does not need to stop any medications.  5. Practice name and name of physician performing surgery? [facility], Dr. Christia Reading Bevis   6. What is your office phone number [phone]   7.   What is your office fax number [phone]  8.   Anesthesia type (None, local, MAC, general) ? Topical Anesthesia w/ IV medication   Derl Barrow 09/28/2017, 10:02 AM  _________________________________________________________________   (provider comments below)

## 2017-09-29 NOTE — Telephone Encounter (Signed)
   Primary Cardiologist: Virl Axe, MD  Chart reviewed as part of pre-operative protocol coverage. Patient was contacted 09/29/2017 in reference to pre-operative risk assessment for pending surgery as outlined below.  NIAYA HICKOK was last seen on 02/03/17 by Dr. Caryl Comes.  Since that day, MAKAYLIN CARLO has done well with no chest pain, shortness of breath, palpitations, dizziness of syncope. Normal pacemaker functioning.   Therefore, based on ACC/AHA guidelines, the patient would be at acceptable risk for the planned low risk procedure without further cardiovascular testing.   I will route this recommendation to the requesting party via Epic fax function and remove from pre-op pool.  Please call with questions.  Daune Perch, NP 09/29/2017, 4:13 PM

## 2017-10-09 ENCOUNTER — Other Ambulatory Visit: Payer: Self-pay | Admitting: Cardiovascular Disease

## 2017-10-14 ENCOUNTER — Telehealth: Payer: Self-pay | Admitting: Cardiovascular Disease

## 2017-10-14 MED ORDER — LISINOPRIL 5 MG PO TABS: 5.0000 mg | ORAL_TABLET | Freq: Every day | ORAL | 3 refills | Status: DC

## 2017-10-14 NOTE — Telephone Encounter (Signed)
Pt calling   Pt stated she doesn't have any more refills on her Lisinopril 5mg  and pt want to know if she need to keep taking it or stop. Please advise. Marland Kitchen

## 2017-10-14 NOTE — Telephone Encounter (Signed)
Returned call to patient who is asking if she should continue taking lisinopril 5 mg QD that was prescribed by Dr. Johnsie Cancel. Patient states that she has been taking it but has no refills. Patient states that her BP has been fine while taking the medicine. Patient is overdue for f/u with PN and was instructed to schedule an appointment for further refills. Patient also sees Dr. Caryl Comes and was seen last by him on 02/03/17. Patient has an appointment with Dr. Caryl Comes on 8/22 at 2:15 PM. Scheduled an appointment for patient to see Dr. Johnsie Cancel on the same day at 2:00 PM. Refill sent in to patient's preferred pharmacy.

## 2017-10-20 ENCOUNTER — Telehealth: Payer: Self-pay

## 2017-10-20 ENCOUNTER — Encounter: Payer: Medicare Other | Admitting: *Deleted

## 2017-10-20 NOTE — Telephone Encounter (Signed)
I spoke with the patient about this. She is in [place] her daughter in the hospital there. I reschedule her remote appointment for 10/27/2017

## 2017-10-21 ENCOUNTER — Encounter: Payer: Self-pay | Admitting: Cardiology

## 2017-10-27 ENCOUNTER — Ambulatory Visit (INDEPENDENT_AMBULATORY_CARE_PROVIDER_SITE_OTHER): Payer: Medicare Other | Admitting: *Deleted

## 2017-10-27 DIAGNOSIS — I442 Atrioventricular block, complete: Secondary | ICD-10-CM | POA: Diagnosis not present

## 2017-10-28 ENCOUNTER — Encounter: Payer: Self-pay | Admitting: Cardiology

## 2017-10-28 NOTE — Progress Notes (Signed)
Remote pacemaker transmission.   

## 2017-12-14 LAB — CUP PACEART REMOTE DEVICE CHECK
Battery Impedance: 372 Ohm
Battery Remaining Longevity: 94 mo
Brady Statistic AP VP Percent: 34 %
Brady Statistic AP VS Percent: 0 %
Brady Statistic AS VP Percent: 66 %
Brady Statistic AS VS Percent: 0 %
Date Time Interrogation Session: 20190606163612
Implantable Lead Implant Date: 20131014
Implantable Lead Location: 753859
Implantable Lead Model: 5076
Implantable Pulse Generator Implant Date: 20131014
Lead Channel Impedance Value: 419 Ohm
Lead Channel Impedance Value: 688 Ohm
Lead Channel Pacing Threshold Amplitude: 0.375 V
Lead Channel Pacing Threshold Amplitude: 0.5 V
Lead Channel Pacing Threshold Pulse Width: 0.4 ms
Lead Channel Setting Pacing Amplitude: 2.5 V
MDC IDC LEAD IMPLANT DT: 20131014
MDC IDC LEAD LOCATION: 753860
MDC IDC MSMT BATTERY VOLTAGE: 2.79 V
MDC IDC MSMT LEADCHNL RV PACING THRESHOLD PULSEWIDTH: 0.4 ms
MDC IDC SET LEADCHNL RA PACING AMPLITUDE: 2 V
MDC IDC SET LEADCHNL RV PACING PULSEWIDTH: 0.4 ms
MDC IDC SET LEADCHNL RV SENSING SENSITIVITY: 4 mV

## 2018-01-10 NOTE — Progress Notes (Signed)
Cardiology Office Note   Date:  01/12/2018   ID:  HILDUR BAYER, DOB [birth year], MRN [id number]  PCP:  [name], MD  Cardiologist:   Jenkins Rouge, MD   No chief complaint on file.     History of Present Illness: Lindsey Bryant is a 77 y.o. female who presents for f/u PPM and low EF Seen by Dr Caryl Comes and Nahser in past. Has PPM for CHB.  Admitted with diarrhea and abdominal pain.March 2018  ? IBS and internal hernia. Rx conservatively with no surgery. During hospitalization troponin elevated and echo done Reviewed 07/30/16  EF 35-40% akinesis mid and apical anteroseptal , inferoseptal , anterolateral  Walls  Cath films reviewed:  08/08/16  No significant stenosis less than 50% prox RCA and mid circumflex.    Started on ACE and d/c form hospital F/U TTE done 02/01/17 with EF improved 45-50%   She still has some fatigue Thinks it's from Parkinson's Some better when she takes her sinemet.  No PND/Orthopnea/ or edema    Past Medical History:  Diagnosis Date  . Acute systolic heart failure ([place])   . Bradycardia    a. 02/2012  . Hyperglycemia   . IBS (irritable bowel syndrome)   . Melanoma ([place])    right arm  . NSTEMI (non-ST elevated myocardial infarction) ([place])   . 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 Parkinson's disease     Past Surgical History:  Procedure Laterality Date  . APPENDECTOMY  1952  . COLONOSCOPY    . LEFT HEART CATH AND CORONARY ANGIOGRAPHY N/A 08/03/2016   Procedure: Left Heart Cath and Coronary Angiography;  Surgeon: Leonie Man, MD;  Location: [place] CV LAB;  Service: Cardiovascular;  Laterality: N/A;  . MELANOMA EXCISION Right 2000   right arm  . PACEMAKER PLACEMENT  05/2012  . PACEMAKER REVISION N/A 03/14/2012   Procedure: PACEMAKER REVISION;  Surgeon: Thompson Grayer, MD;  Location[facility] CATH LAB;  Service: Cardiovascular;  Laterality: N/A;  . PERMANENT PACEMAKER INSERTION N/A 03/06/2012   Procedure: PERMANENT PACEMAKER INSERTION;  Surgeon:  Deboraha Sprang, MD;  Location: [facility] CATH LAB;  Service: Cardiovascular;  Laterality: N/A;  . 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     Current Outpatient Medications  Medication Sig Dispense Refill  . aspirin EC 81 MG tablet Take 81 mg by mouth at bedtime.    . carbidopa-levodopa (SINEMET IR) 25-100 MG per tablet Take 2 tablets by mouth 4 (four) times daily.     . Cholecalciferol (VITAMIN D3) 2000 units capsule Take 2,000 Units by mouth daily.    . clidinium-chlordiazePOXIDE (LIBRAX) 5-2.5 MG capsule TAKE ONE CAPSULE BY MOUTH 3 TIMES A DAY AS NEEDED 180 capsule 0  . docusate sodium (COLACE) 100 MG capsule Take 100 mg by mouth daily as needed for mild constipation.    Marland Kitchen lisinopril (PRINIVIL,ZESTRIL) 5 MG tablet Take 1 tablet (5 mg total) by mouth daily. 90 tablet 3  . Omega-3 1000 MG CAPS Take 1 capsule by mouth daily.    . Probiotic Product (PROBIOTIC PO) Take 1 capsule by mouth daily.     No current facility-administered medications for this visit.     Allergies:   Patient has no known allergies.    Social History:  The patient  reports that she has quit smoking. She has never used smokeless tobacco. She reports that she does not drink alcohol or use drugs.   Family History:  The  patient's family history includes Alcohol abuse in her father; Breast cancer in her unknown relative; Diabetes in her brother; Heart attack (age of onset: 38) in her mother; Hypothyroidism in her daughter and daughter.    ROS:  Please see the history of present illness.   Otherwise, review of systems are positive for none.   All other systems are reviewed and negative.    PHYSICAL EXAM: VS:  BP 100/62 (BP Location: Right Arm, Patient Position: Sitting, Cuff Size: Normal)   Pulse (!) 52   Ht 5\' 2"  (1.575 m)   Wt 123 lb (55.8 kg)   SpO2 98%   BMI 22.50 kg/m  , BMI Body mass index is 22.5 kg/m. Affect appropriate Healthy:  appears stated age 8: normal Neck supple with no  adenopathy JVP normal no bruits no thyromegaly Lungs clear with no wheezing and good diaphragmatic motion Heart:  S1/S2 no murmur, no rub, gallop or click PMI normal Abdomen: benighn, BS positve, no tenderness, no AAA no bruit.  No HSM or HJR Distal pulses intact with no bruits No edema Neuro non-focal Skin warm and dry No muscular weakness    EKG:  A sense V pacing    Recent Labs: No results found for requested labs within last 8760 hours.    Lipid Panel    Component Value Date/Time   CHOL 199 07/17/2014 1103   TRIG 77.0 07/17/2014 1103   HDL 75.70 07/17/2014 1103   CHOLHDL 3 07/17/2014 1103   VLDL 15.4 07/17/2014 1103   LDLCALC 108 (H) 07/17/2014 1103   LDLDIRECT 111.7 06/05/2012 0900      Wt Readings from Last 3 Encounters:  01/12/18 123 lb (55.8 kg)  01/12/18 123 lb (55.8 kg)  02/03/17 124 lb (56.2 kg)      Other studies Reviewed: Additional studies/ records that were reviewed today include: Notes hospitalization 07/2016 ECG;s echo and cath films .    ASSESSMENT AND PLAN:  1. CAD: No hemodynamically significant blockages cath 08/08/16 continue ASA/Statin 2. PPM:  Normal function f/u with Dr Caryl Comes 3. DCM:  ? Stress induced Takatsubo like condition EF improved by TTE 02/01/17 45-50% will update given ongoing fatigue  4. Parkinson's tired in am before sinemet kicks in f/u neurology  5. IBS/GI:  F/U [name] less diarrhea since d/c from hospital  6. Chol:  Not that high statin causing nausea will D/C   Current medicines are reviewed at length with the patient today.  The patient does not have concerns regarding medicines.  The following changes have been made:  no change  Labs/ tests ordered today include: Echo    Orders Placed This Encounter  Procedures  . ECHOCARDIOGRAM COMPLETE     Disposition:   FU with me in 6 months      Signed, Jenkins Rouge, MD  01/12/2018 2:45 PM    [facility] [place], [place], [place]   [id number] Phone: [phone]; Fax: [phone]

## 2018-01-12 ENCOUNTER — Encounter: Payer: Self-pay | Admitting: Cardiovascular Disease

## 2018-01-12 ENCOUNTER — Encounter: Payer: Self-pay | Admitting: Internal Medicine

## 2018-01-12 ENCOUNTER — Ambulatory Visit: Payer: Medicare Other | Admitting: Internal Medicine

## 2018-01-12 ENCOUNTER — Ambulatory Visit (INDEPENDENT_AMBULATORY_CARE_PROVIDER_SITE_OTHER): Payer: Medicare Other | Admitting: Cardiovascular Disease

## 2018-01-12 VITALS — BP 100/62 | HR 52 | Ht 62.0 in | Wt 123.0 lb

## 2018-01-12 VITALS — BP 100/62 | HR 67 | Ht 62.0 in | Wt 123.0 lb

## 2018-01-12 DIAGNOSIS — Z950 Presence of cardiac pacemaker: Secondary | ICD-10-CM | POA: Diagnosis not present

## 2018-01-12 DIAGNOSIS — I214 Non-ST elevation (NSTEMI) myocardial infarction: Secondary | ICD-10-CM

## 2018-01-12 DIAGNOSIS — I5021 Acute systolic (congestive) heart failure: Secondary | ICD-10-CM

## 2018-01-12 DIAGNOSIS — I429 Cardiomyopathy, unspecified: Secondary | ICD-10-CM

## 2018-01-12 DIAGNOSIS — I442 Atrioventricular block, complete: Secondary | ICD-10-CM | POA: Diagnosis not present

## 2018-01-12 LAB — CUP PACEART INCLINIC DEVICE CHECK
Battery Remaining Longevity: 91 mo
Date Time Interrogation Session: 20190822175632
Implantable Lead Implant Date: 20131014
Implantable Lead Location: 753859
Implantable Lead Location: 753860
Implantable Pulse Generator Implant Date: 20131014
Lead Channel Impedance Value: 626 Ohm
Lead Channel Pacing Threshold Amplitude: 0.5 V
Lead Channel Pacing Threshold Pulse Width: 0.4 ms
Lead Channel Setting Pacing Amplitude: 2 V
Lead Channel Setting Pacing Amplitude: 2.5 V
Lead Channel Setting Pacing Pulse Width: 0.4 ms
MDC IDC LEAD IMPLANT DT: 20131014
MDC IDC MSMT BATTERY IMPEDANCE: 395 Ohm
MDC IDC MSMT BATTERY VOLTAGE: 2.79 V
MDC IDC MSMT LEADCHNL RA IMPEDANCE VALUE: 415 Ohm
MDC IDC MSMT LEADCHNL RA PACING THRESHOLD AMPLITUDE: 0.5 V
MDC IDC MSMT LEADCHNL RA PACING THRESHOLD PULSEWIDTH: 0.4 ms
MDC IDC MSMT LEADCHNL RA SENSING INTR AMPL: 2.8 mV
MDC IDC SET LEADCHNL RV SENSING SENSITIVITY: 4 mV
MDC IDC STAT BRADY AP VP PERCENT: 34 %
MDC IDC STAT BRADY AP VS PERCENT: 0 %
MDC IDC STAT BRADY AS VP PERCENT: 66 %
MDC IDC STAT BRADY AS VS PERCENT: 0 %

## 2018-01-12 MED ORDER — LISINOPRIL 5 MG PO TABS: 5.0000 mg | ORAL_TABLET | Freq: Every day | ORAL | 3 refills | Status: DC

## 2018-01-12 NOTE — Patient Instructions (Signed)

## 2018-01-12 NOTE — Progress Notes (Signed)
Patient Care Team: [name], MD as PCP - General (Family Medicine) Deboraha Sprang, MD as PCP - Cardiology (Cardiology) Sherlyn Lees, MD as Referring Physician (Neurology)   HPI  Lindsey Bryant is a 77 y.o. 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    She was hospitalized 3/18. She was found to have left ventricular dysfunction and underwent catheterization   She is playing golf.  She is doing pretty much what she wants.  Mild shortness of breath.  No edema.  No chest pain.   DATE TEST    5/14 Echo   EF 55 %   3/18 Cath  Non Obstructive CAs  3/18 Echo EF 30-35   9/18 Echo EF 45-50%        Date Cr K  8/18 0.75 4.6         She asks about long-term facilities for her and her husband   Past Medical History:  Diagnosis Date  . Acute systolic heart failure ([place])   . Bradycardia    a. 02/2012  . Hyperglycemia   . IBS (irritable bowel syndrome)   . Melanoma ([place])    right arm  . NSTEMI (non-ST elevated myocardial infarction) ([place])   . 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 Parkinson's disease     Past Surgical History:  Procedure Laterality Date  . APPENDECTOMY  1952  . COLONOSCOPY    . LEFT HEART CATH AND CORONARY ANGIOGRAPHY N/A 08/03/2016   Procedure: Left Heart Cath and Coronary Angiography;  Surgeon: Leonie Man, MD;  Location: [place] [facility];  Service: Cardiovascular;  Laterality: N/A;  . MELANOMA EXCISION Right 2000   right arm  . PACEMAKER PLACEMENT  05/2012  . PACEMAKER REVISION N/A 03/14/2012   Procedure: PACEMAKER REVISION;  Surgeon: Thompson Grayer, MD;  Location: [facility] CATH LAB;  Service: Cardiovascular;  Laterality: N/A;  . PERMANENT PACEMAKER INSERTION N/A 03/06/2012   Procedure: PERMANENT PACEMAKER INSERTION;  Surgeon: Deboraha Sprang, MD;  Location: [facility] CATH LAB;  Service: Cardiovascular;  Laterality: N/A;  . SKIN CANCER DESTRUCTION  2005   Squamous cell on  the nose    Current Outpatient Medications  Medication Sig Dispense Refill  . aspirin EC 81 MG tablet Take 81 mg by mouth at bedtime.    . carbidopa-levodopa (SINEMET IR) 25-100 MG per tablet Take 2 tablets by mouth 4 (four) times daily.     . Cholecalciferol (VITAMIN D3) 2000 units capsule Take 2,000 Units by mouth daily.    . clidinium-chlordiazePOXIDE (LIBRAX) 5-2.5 MG capsule TAKE ONE CAPSULE BY MOUTH 3 TIMES A DAY AS NEEDED 180 capsule 0  . docusate sodium (COLACE) 100 MG capsule Take 100 mg by mouth daily as needed for mild constipation.    Marland Kitchen lisinopril (PRINIVIL,ZESTRIL) 5 MG tablet Take 1 tablet (5 mg total) by mouth daily. 90 tablet 3  . Omega-3 1000 MG CAPS Take 1 capsule by mouth daily.    . Probiotic Product (PROBIOTIC PO) Take 1 capsule by mouth daily.     No current facility-administered medications for this visit.     No Known Allergies  Review of Systems negative except from HPI and PMH  Physical Exam BP 100/62   Pulse 67   Ht 5\' 2"  (1.575 m)   Wt 123 lb (55.8 kg)   SpO2 98%   BMI 22.50 kg/m  Well developed and nourished in no acute distress  HENT normal Neck supple with JVP-flat Clear Regular rate and rhythm, no murmurs or gallops Abd-soft with active BS No Clubbing cyanosis edema Skin-warm and dry A & Oriented  Grossly normal sensory and motor function   ECG sinus with P synchronous pacing with intermittent junctional capture beats  Assessment and  Plan  Complete heart block  Narrow escape  NICM  Post prandial Fatigue/Parkinson's   Pacemaker Medtronic The patient's device was interrogated.  The information was reviewed. No changes were made in the programming.     Euvolemic continue current meds  Had a lengthy discussion regarding coming to terms with long-term health issues and whether it is appropriate to move into a long-term care facility  Reviewed various aspects of this.  Suggested consider having her daughter's read Being Mortal  We  spent more than 50% of our >25 min visit in face to face counseling regarding the above

## 2018-01-12 NOTE — Patient Instructions (Signed)
Medication Instructions:  Your physician recommends that you continue on your current medications as directed. Please refer to the Current Medication list given to you today.  Labwork: None ordered.  Testing/Procedures: None ordered.  Follow-Up: Your physician wants you to follow-up in: One Year with Dr Caryl Comes. You will receive a reminder letter in the mail two months in advance. If you don't receive a letter, please call our office to schedule the follow-up appointment.  Remote monitoring is used to monitor your Pacemaker of ICD from home. This monitoring reduces the number of office visits required to check your device to one time per year. It allows [place] to keep an eye on the functioning of your device to ensure it is working properly. You are scheduled for a device check from home on 01/26/2018. You may send your transmission at any time that day. If you have a wireless device, the transmission will be sent automatically. After your physician reviews your transmission, you will receive a postcard with your next transmission date.     Any Other Special Instructions Will Be Listed Below (If Applicable).     If you need a refill on your cardiac medications before your next appointment, please call your pharmacy.

## 2018-01-17 ENCOUNTER — Ambulatory Visit (HOSPITAL_COMMUNITY): Payer: Medicare Other | Attending: Cardiovascular Disease

## 2018-01-17 ENCOUNTER — Other Ambulatory Visit: Payer: Self-pay

## 2018-01-17 DIAGNOSIS — I5021 Acute systolic (congestive) heart failure: Secondary | ICD-10-CM | POA: Insufficient documentation

## 2018-01-17 DIAGNOSIS — I371 Nonrheumatic pulmonary valve insufficiency: Secondary | ICD-10-CM | POA: Insufficient documentation

## 2018-01-17 DIAGNOSIS — I429 Cardiomyopathy, unspecified: Secondary | ICD-10-CM | POA: Diagnosis not present

## 2018-01-17 DIAGNOSIS — I071 Rheumatic tricuspid insufficiency: Secondary | ICD-10-CM | POA: Diagnosis not present

## 2018-01-26 ENCOUNTER — Ambulatory Visit (INDEPENDENT_AMBULATORY_CARE_PROVIDER_SITE_OTHER): Payer: Medicare Other | Admitting: *Deleted

## 2018-01-26 ENCOUNTER — Telehealth: Payer: Self-pay | Admitting: Cardiology

## 2018-01-26 DIAGNOSIS — I442 Atrioventricular block, complete: Secondary | ICD-10-CM | POA: Diagnosis not present

## 2018-01-26 NOTE — Telephone Encounter (Signed)
LMOVM reminding pt to send remote transmission.   

## 2018-01-27 NOTE — Progress Notes (Signed)
Remote pacemaker transmission.   

## 2018-02-21 ENCOUNTER — Other Ambulatory Visit: Payer: Self-pay | Admitting: Family Medicine

## 2018-02-21 LAB — CUP PACEART REMOTE DEVICE CHECK
Battery Remaining Longevity: 91 mo
Brady Statistic AP VP Percent: 31 %
Brady Statistic AS VP Percent: 68 %
Brady Statistic AS VS Percent: 1 %
Implantable Lead Implant Date: 20131014
Implantable Lead Implant Date: 20131014
Implantable Lead Location: 753860
Implantable Lead Model: 5076
Lead Channel Impedance Value: 656 Ohm
Lead Channel Pacing Threshold Amplitude: 0.375 V
Lead Channel Pacing Threshold Amplitude: 0.5 V
Lead Channel Pacing Threshold Pulse Width: 0.4 ms
Lead Channel Setting Pacing Amplitude: 2 V
Lead Channel Setting Pacing Pulse Width: 0.4 ms
Lead Channel Setting Sensing Sensitivity: 4 mV
MDC IDC LEAD LOCATION: 753859
MDC IDC MSMT BATTERY IMPEDANCE: 420 Ohm
MDC IDC MSMT BATTERY VOLTAGE: 2.78 V
MDC IDC MSMT LEADCHNL RA IMPEDANCE VALUE: 438 Ohm
MDC IDC MSMT LEADCHNL RA PACING THRESHOLD PULSEWIDTH: 0.4 ms
MDC IDC PG IMPLANT DT: 20131014
MDC IDC SESS DTM: 20190905163502
MDC IDC SET LEADCHNL RV PACING AMPLITUDE: 2.5 V
MDC IDC STAT BRADY AP VS PERCENT: 0 %

## 2018-02-22 ENCOUNTER — Other Ambulatory Visit: Payer: Self-pay | Admitting: Family Medicine

## 2018-03-06 ENCOUNTER — Telehealth: Payer: Self-pay | Admitting: Family Medicine

## 2018-03-06 ENCOUNTER — Other Ambulatory Visit: Payer: Self-pay

## 2018-03-06 MED ORDER — CILIDINIUM-CHLORDIAZEPOXIDE 2.5-5 MG PO CAPS: ORAL_CAPSULE | ORAL | 0 refills | Status: DC

## 2018-03-06 NOTE — Telephone Encounter (Signed)
Copied from [place] [id number]. Topic: Quick Communication - See Telephone Encounter >> Mar 06, 2018 10:49 AM Ahmed Prima L wrote: CRM for notification. See Telephone encounter for: 03/06/18.  clidinium-chlordiazePOXIDE (LIBRAX) 5-2.5 MG capsule  [id number] - [place], [place] - [address] [place] [address]

## 2018-03-06 NOTE — Telephone Encounter (Signed)
Only a 30 capsule amount was sent until the patient can be seen on 03/20/18.

## 2018-03-20 ENCOUNTER — Other Ambulatory Visit: Payer: Self-pay

## 2018-03-20 ENCOUNTER — Encounter: Payer: Self-pay | Admitting: Family Medicine

## 2018-03-20 ENCOUNTER — Ambulatory Visit (INDEPENDENT_AMBULATORY_CARE_PROVIDER_SITE_OTHER): Payer: Medicare Other | Admitting: Family Medicine

## 2018-03-20 VITALS — BP 96/60 | HR 95 | Temp 97.8°F | Ht 61.0 in | Wt 120.6 lb

## 2018-03-20 DIAGNOSIS — Z780 Asymptomatic menopausal state: Secondary | ICD-10-CM | POA: Diagnosis not present

## 2018-03-20 DIAGNOSIS — Z0000 Encounter for general adult medical examination without abnormal findings: Secondary | ICD-10-CM

## 2018-03-20 NOTE — Patient Instructions (Signed)
Call to set up mammogram and DEXA  We will call you with lab results.

## 2018-03-20 NOTE — Progress Notes (Signed)
Subjective:     Patient ID: Lindsey Bryant, female   DOB: [birth year], 77 y.o.   MRN: [id number]  HPI Pt is here today for CPE.  She has hx of Parkinson's Disease, CAD, h/o complete heart block (now with pacemaker), IBS, and hx of melanoma.  She stays quite active and has recently been doing some dancing.  No falls.   No depression symptoms.  Followed by Cardiology and Neurology.   Hx of CAD with hx of NSTEMI.  Could not tolerate Lipitor secondary to nausea.LV end diastolic pressure is mildly elevated.  Cath 08/03/16:   There is no aortic valve stenosis.  Prox RCA lesion, 50 %stenosed.  Dist LAD lesion, 40 %stenosed.  Prox Cx to Mid Cx lesion, 50 %stenosed.  Ost LAD lesion, 35 %stenosed.    Moderate disease involving the proximal RCA and distal LAD. No significant culprit lesion to explain regional wall motion abnormality.  Suspect nonischemic etiology Mildly elevated LVEDP    Heatlh maintenance reviewed:  -flu vaccine already given -she has received Prevnar 13 and pneumovax after 65. -last DEXA 2016-  Normal -tetanus 2013 -last mammogram ?2015. -colonoscopy 2015   Past Medical History:  Diagnosis Date  . Acute systolic heart failure ([place])   . Bradycardia    a. 02/2012  . Hyperglycemia   . IBS (irritable bowel syndrome)   . Melanoma ([place])    right arm  . NSTEMI (non-ST elevated myocardial infarction) ([place])   . 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 Parkinson's disease    Past Surgical History:  Procedure Laterality Date  . APPENDECTOMY  1952  . COLONOSCOPY    . EYE SURGERY  07/2017   Cataract Sx  . LEFT HEART CATH AND CORONARY ANGIOGRAPHY N/A 08/03/2016   Procedure: Left Heart Cath and Coronary Angiography;  Surgeon: Leonie Man, MD;  Location: [place] CV LAB;  Service: Cardiovascular;  Laterality: N/A;  . MELANOMA EXCISION Right 2000   right arm  . PACEMAKER PLACEMENT  05/2012  . PACEMAKER REVISION N/A 03/14/2012   Procedure: PACEMAKER REVISION;   Surgeon: Thompson Grayer, MD;  Location: [facility] CATH LAB;  Service: Cardiovascular;  Laterality: N/A;  . PERMANENT PACEMAKER INSERTION N/A 03/06/2012   Procedure: PERMANENT PACEMAKER INSERTION;  Surgeon: Deboraha Sprang, MD;  Location: [facility] CATH LAB;  Service: Cardiovascular;  Laterality: N/A;  . 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 She has never used smokeless tobacco. She reports that she does not drink alcohol or use drugs. family history includes Alcohol abuse in her father; Breast cancer in her unknown relative; Diabetes in her brother; Heart attack (age of onset: 60) in her mother; Hypothyroidism in her daughter and daughter. No Known Allergies     Review of Systems  Constitutional: Negative for activity change, appetite change, fatigue, fever and unexpected weight change.  HENT: Negative for ear pain, hearing loss, sore throat and trouble swallowing.   Eyes: Negative for visual disturbance.  Respiratory: Negative for cough and shortness of breath.   Cardiovascular: Negative for chest pain and palpitations.  Gastrointestinal: Negative for abdominal pain, blood in stool, constipation and diarrhea.  Genitourinary: Negative for dysuria and hematuria.  Musculoskeletal: Negative for arthralgias, back pain and myalgias.  Skin: Negative for rash.  Neurological: Negative for dizziness, syncope and headaches.  Hematological: Negative for adenopathy.  Psychiatric/Behavioral: Negative for confusion and dysphoric mood.       Objective:   Physical Exam  Constitutional: She is oriented to person, place, and time. She appears well-developed and well-nourished.  HENT:  Head: Normocephalic and atraumatic.  Eyes: Pupils are equal, round, and reactive to light. EOM are normal.  Neck: Normal range of motion. Neck supple. No thyromegaly present.  Cardiovascular: Normal rate, regular rhythm and normal heart sounds.  No murmur  heard. Pulmonary/Chest: Breath sounds normal. No respiratory distress. She has no wheezes. She has no rales.  Abdominal: Soft. Bowel sounds are normal. She exhibits no distension and no mass. There is no tenderness. There is no rebound and no guarding.  Musculoskeletal: Normal range of motion. She exhibits no edema.  Lymphadenopathy:    She has no cervical adenopathy.  Neurological: She is alert and oriented to person, place, and time. She displays normal reflexes. No cranial nerve deficit.  Skin: No rash noted.  Psychiatric: She has a normal mood and affect. Her behavior is normal. Judgment and thought content normal.       Assessment:     Physical exam.  The following health maintenance items were discussed.      Plan:     -repeat labs -consider alternative statin (prior intolerance with Lipitor secondary to nausea)- especially in view of her non-obstructive CAD. -set up repeat DEXA. -she will set up repeat mammogram. -discussed Shingrix and she will consider. -continue with regular weight bearing exercise.  [name] MD [facility]

## 2018-03-21 LAB — HEPATIC FUNCTION PANEL
ALBUMIN: 4.2 g/dL (ref 3.5–5.2)
ALT: 7 U/L (ref 0–35)
AST: 15 U/L (ref 0–37)
Alkaline Phosphatase: 47 U/L (ref 39–117)
Bilirubin, Direct: 0.1 mg/dL (ref 0.0–0.3)
TOTAL PROTEIN: 6.3 g/dL (ref 6.0–8.3)
Total Bilirubin: 0.4 mg/dL (ref 0.2–1.2)

## 2018-03-21 LAB — BASIC METABOLIC PANEL
BUN: 14 mg/dL (ref 6–23)
CHLORIDE: 103 meq/L (ref 96–112)
CO2: 26 mEq/L (ref 19–32)
CREATININE: 0.78 mg/dL (ref 0.40–1.20)
Calcium: 9.3 mg/dL (ref 8.4–10.5)
GFR: 76.19 mL/min (ref >60.00)
Glucose, Bld: 95 mg/dL (ref 70–99)
POTASSIUM: 4.2 meq/L (ref 3.5–5.1)
Sodium: 136 mEq/L (ref 135–145)

## 2018-03-21 LAB — LIPID PANEL
CHOLESTEROL: 184 mg/dL (ref 0–200)
HDL: 71.2 mg/dL (ref >39.00)
LDL CALC: 92 mg/dL (ref 0–99)
NonHDL: 112.84
Total CHOL/HDL Ratio: 3
Triglycerides: 104 mg/dL (ref 0.0–149.0)
VLDL: 20.8 mg/dL (ref 0.0–40.0)

## 2018-03-21 LAB — CBC WITH DIFFERENTIAL/PLATELET
BASOS PCT: 0.3 % (ref 0.0–3.0)
Basophils Absolute: 0 10*3/uL (ref 0.0–0.1)
EOS PCT: 2.2 % (ref 0.0–5.0)
Eosinophils Absolute: 0.2 10*3/uL (ref 0.0–0.7)
HEMATOCRIT: 36.5 % (ref 36.0–46.0)
HEMOGLOBIN: 12.6 g/dL (ref 12.0–15.0)
Lymphocytes Relative: 26.5 % (ref 12.0–46.0)
Lymphs Abs: 1.9 10*3/uL (ref 0.7–4.0)
MCHC: 34.6 g/dL (ref 30.0–36.0)
MCV: 98.2 fl (ref 78.0–100.0)
MONO ABS: 0.5 10*3/uL (ref 0.1–1.0)
MONOS PCT: 6.9 % (ref 3.0–12.0)
Neutro Abs: 4.6 10*3/uL (ref 1.4–7.7)
Neutrophils Relative %: 64.1 % (ref 43.0–77.0)
Platelets: 166 10*3/uL (ref 150.0–400.0)
RBC: 3.72 Mil/uL — ABNORMAL LOW (ref 3.87–5.11)
RDW: 12.5 % (ref 11.5–15.5)
WBC: 7.1 10*3/uL (ref 4.0–10.5)

## 2018-03-21 LAB — TSH: TSH: 1.37 u[IU]/mL (ref 0.35–4.50)

## 2018-03-27 ENCOUNTER — Other Ambulatory Visit: Payer: Self-pay | Admitting: Family Medicine

## 2018-03-27 DIAGNOSIS — Z1231 Encounter for screening mammogram for malignant neoplasm of breast: Secondary | ICD-10-CM

## 2018-04-27 ENCOUNTER — Ambulatory Visit (INDEPENDENT_AMBULATORY_CARE_PROVIDER_SITE_OTHER): Payer: Medicare Other

## 2018-04-27 ENCOUNTER — Telehealth: Payer: Self-pay

## 2018-04-27 DIAGNOSIS — I429 Cardiomyopathy, unspecified: Secondary | ICD-10-CM

## 2018-04-27 DIAGNOSIS — I442 Atrioventricular block, complete: Secondary | ICD-10-CM | POA: Diagnosis not present

## 2018-04-27 NOTE — Telephone Encounter (Signed)
LMOVM reminding pt to send remote transmission.   

## 2018-04-28 NOTE — Progress Notes (Signed)
Remote pacemaker transmission.   

## 2018-05-03 ENCOUNTER — Encounter: Payer: Self-pay | Admitting: Cardiology

## 2018-06-05 DIAGNOSIS — R69 Illness, unspecified: Secondary | ICD-10-CM | POA: Diagnosis not present

## 2018-06-12 ENCOUNTER — Ambulatory Visit
Admission: RE | Admit: 2018-06-12 | Discharge: 2018-06-12 | Disposition: A | Discharge status: Home / Self Care | Payer: Medicare HMO | Source: Ambulatory Visit | Attending: Family Medicine | Admitting: Family Medicine

## 2018-06-12 DIAGNOSIS — Z780 Asymptomatic menopausal state: Secondary | ICD-10-CM | POA: Diagnosis not present

## 2018-06-12 DIAGNOSIS — M85851 Other specified disorders of bone density and structure, right thigh: Secondary | ICD-10-CM | POA: Diagnosis not present

## 2018-06-12 DIAGNOSIS — Z1231 Encounter for screening mammogram for malignant neoplasm of breast: Secondary | ICD-10-CM

## 2018-06-12 LAB — CUP PACEART REMOTE DEVICE CHECK
Battery Remaining Longevity: 91 mo
Brady Statistic AS VP Percent: 70 %
Implantable Lead Implant Date: 20131014
Implantable Lead Location: 753860
Implantable Pulse Generator Implant Date: 20131014
Lead Channel Pacing Threshold Amplitude: 0.375 V
Lead Channel Pacing Threshold Amplitude: 0.5 V
Lead Channel Pacing Threshold Pulse Width: 0.4 ms
Lead Channel Setting Pacing Amplitude: 2 V
Lead Channel Setting Sensing Sensitivity: 4 mV
MDC IDC LEAD IMPLANT DT: 20131014
MDC IDC LEAD LOCATION: 753859
MDC IDC MSMT BATTERY IMPEDANCE: 420 Ohm
MDC IDC MSMT BATTERY VOLTAGE: 2.79 V
MDC IDC MSMT LEADCHNL RA IMPEDANCE VALUE: 442 Ohm
MDC IDC MSMT LEADCHNL RA PACING THRESHOLD PULSEWIDTH: 0.4 ms
MDC IDC MSMT LEADCHNL RV IMPEDANCE VALUE: 654 Ohm
MDC IDC SESS DTM: 20191205223453
MDC IDC SET LEADCHNL RV PACING AMPLITUDE: 2.5 V
MDC IDC SET LEADCHNL RV PACING PULSEWIDTH: 0.4 ms
MDC IDC STAT BRADY AP VP PERCENT: 29 %
MDC IDC STAT BRADY AP VS PERCENT: 0 %
MDC IDC STAT BRADY AS VS PERCENT: 1 %

## 2018-06-20 DIAGNOSIS — R69 Illness, unspecified: Secondary | ICD-10-CM | POA: Diagnosis not present

## 2018-07-12 ENCOUNTER — Other Ambulatory Visit: Payer: Self-pay | Admitting: Family Medicine

## 2018-07-26 ENCOUNTER — Encounter: Payer: Self-pay | Admitting: Family Medicine

## 2018-07-26 ENCOUNTER — Other Ambulatory Visit: Payer: Self-pay

## 2018-07-26 ENCOUNTER — Ambulatory Visit (INDEPENDENT_AMBULATORY_CARE_PROVIDER_SITE_OTHER): Payer: Medicare HMO | Admitting: Family Medicine

## 2018-07-26 VITALS — BP 112/68 | HR 67 | Temp 97.9°F | Ht 61.0 in | Wt 121.1 lb

## 2018-07-26 DIAGNOSIS — S0502XA Injury of conjunctiva and corneal abrasion without foreign body, left eye, initial encounter: Secondary | ICD-10-CM

## 2018-07-26 MED ORDER — TOBRAMYCIN 0.3 % OP SOLN: 2.0000 [drp] | OPHTHALMIC | 0 refills | Status: DC

## 2018-07-26 NOTE — Patient Instructions (Signed)
Follow up in 2 days for recheck  Follow up sooner for any blurred vision or increased pain.

## 2018-07-26 NOTE — Progress Notes (Signed)
Subjective:     Patient ID: Lindsey Bryant, female   DOB: 06-12-1940, 78 y.o.   MRN: [id number]  HPI Patient is seen with 1 day history of left eye irritation.  She has noticed some slight redness but mostly some pain especially with opening and closing the lid.  She actually woke up this morning with irritation.  She denies any foreign body or injury.  She had used some heavier mascara recently.  No contact use.  She had cataract surgery about a year ago.  Denies any blurred vision.  No diplopia.  No headaches.  No nausea or vomiting.  No dizziness. Denies any eye drainage  Past Medical History:  Diagnosis Date  . Acute systolic heart failure ([place])   . Bradycardia    a. 02/2012  . Hyperglycemia   . IBS (irritable bowel syndrome)   . Melanoma ([place])    right arm  . NSTEMI (non-ST elevated myocardial infarction) ([place])   . 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 Parkinson's disease    Past Surgical History:  Procedure Laterality Date  . APPENDECTOMY  1952  . COLONOSCOPY    . EYE SURGERY  07/2017   Cataract Sx  . LEFT HEART CATH AND CORONARY ANGIOGRAPHY N/A 08/03/2016   Procedure: Left Heart Cath and Coronary Angiography;  Surgeon: Leonie Man, MD;  Location: [place] CV LAB;  Service: Cardiovascular;  Laterality: N/A;  . MELANOMA EXCISION Right 2000   right arm  . PACEMAKER PLACEMENT  05/2012  . PACEMAKER REVISION N/A 03/14/2012   Procedure: PACEMAKER REVISION;  Surgeon: Thompson Grayer, MD;  Location: [facility] CATH LAB;  Service: Cardiovascular;  Laterality: N/A;  . PERMANENT PACEMAKER INSERTION N/A 03/06/2012   Procedure: PERMANENT PACEMAKER INSERTION;  Surgeon: Deboraha Sprang, MD;  Location: [facility] CATH LAB;  Service: Cardiovascular;  Laterality: N/A;  . 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 She has never used smokeless tobacco. She reports that she does not drink alcohol or use drugs. family history includes Alcohol abuse in her  father; Breast cancer in an other family member; Diabetes in her brother; Heart attack (age of onset: 27) in her mother; Hypothyroidism in her daughter and daughter. No Known Allergies   Review of Systems  Constitutional: Negative for chills and fever.  HENT: Negative for congestion.   Eyes: Positive for pain. Negative for photophobia, discharge, itching and visual disturbance.  Neurological: Negative for headaches.       Objective:   Physical Exam Constitutional:      Appearance: Normal appearance.  Eyes:     Extraocular Movements: Extraocular movements intact.     Conjunctiva/sclera: Conjunctivae normal.     Pupils: Pupils are equal, round, and reactive to light.     Comments: No conjunctival erythema.  No purulent secretions.  Extraocular movements normal.  In looking initially with ophthalmoscope we did not see any obvious foreign bodies or defects.  We then use some fluorescein and did see what appears to be a superficial abrasion involving the left eye just above the pupil region.  This is approximately 2 mm diameter.  We cannot appreciate a definite foreign body  Cardiovascular:     Rate and Rhythm: Normal rate and regular rhythm.  Neurological:     Mental Status: She is alert.        Assessment:     Left eye pain of 1 day duration.  No obvious foreign body.  She appears to have a superficial abrasion    Plan:     -Start Tobrex antibiotic eyedrops 2 drops left eye every 4 hours while awake -Follow-up immediately for any blurred vision, increased pain, or other concerns -Return Friday morning to reassess.  If defect not fully cleared at that point will recommend ophthalmology referral  [name] MD [facility]

## 2018-07-27 ENCOUNTER — Ambulatory Visit (INDEPENDENT_AMBULATORY_CARE_PROVIDER_SITE_OTHER): Payer: Medicare HMO | Admitting: *Deleted

## 2018-07-27 DIAGNOSIS — I442 Atrioventricular block, complete: Secondary | ICD-10-CM

## 2018-07-28 ENCOUNTER — Other Ambulatory Visit: Payer: Self-pay

## 2018-07-28 ENCOUNTER — Encounter: Payer: Self-pay | Admitting: Family Medicine

## 2018-07-28 ENCOUNTER — Ambulatory Visit (INDEPENDENT_AMBULATORY_CARE_PROVIDER_SITE_OTHER): Payer: Medicare HMO | Admitting: Family Medicine

## 2018-07-28 VITALS — BP 128/78 | HR 67 | Temp 98.1°F | Ht 61.0 in | Wt 121.2 lb

## 2018-07-28 DIAGNOSIS — S0502XD Injury of conjunctiva and corneal abrasion without foreign body, left eye, subsequent encounter: Secondary | ICD-10-CM

## 2018-07-28 LAB — CUP PACEART REMOTE DEVICE CHECK
Battery Impedance: 493 Ohm
Battery Remaining Longevity: 86 mo
Battery Voltage: 2.78 V
Brady Statistic AP VP Percent: 28 %
Brady Statistic AP VS Percent: 0 %
Brady Statistic AS VP Percent: 70 %
Date Time Interrogation Session: 20200305163630
Implantable Lead Implant Date: 20131014
Implantable Lead Implant Date: 20131014
Implantable Lead Location: 753859
Implantable Lead Location: 753860
Implantable Lead Model: 5076
Implantable Lead Model: 5076
Implantable Pulse Generator Implant Date: 20131014
Lead Channel Impedance Value: 482 Ohm
Lead Channel Impedance Value: 664 Ohm
Lead Channel Pacing Threshold Amplitude: 0.375 V
Lead Channel Pacing Threshold Pulse Width: 0.4 ms
Lead Channel Pacing Threshold Pulse Width: 0.4 ms
Lead Channel Setting Pacing Amplitude: 2 V
Lead Channel Setting Pacing Amplitude: 2.5 V
Lead Channel Setting Pacing Pulse Width: 0.4 ms
MDC IDC MSMT LEADCHNL RV PACING THRESHOLD AMPLITUDE: 0.5 V
MDC IDC SET LEADCHNL RV SENSING SENSITIVITY: 4 mV
MDC IDC STAT BRADY AS VS PERCENT: 1 %

## 2018-07-28 NOTE — Progress Notes (Signed)
  Subjective:     Patient ID: Lindsey Bryant, female   DOB: May 18, 1941, 78 y.o.   MRN: [id number]  HPI Patient is seen for follow-up of recent left eye irritation.  We saw what looked like a superficial abrasion.  She was started on antibiotic drops and symptoms have basically resolved at this time.  No tearing.  No blurred vision.  No pain.  She initially thought this was a foreign body but we could not appreciate any definite foreign body.  Past Medical History:  Diagnosis Date  . Acute systolic heart failure ([place])   . Bradycardia    a. 02/2012  . Hyperglycemia   . IBS (irritable bowel syndrome)   . Melanoma ([place])    right arm  . NSTEMI (non-ST elevated myocardial infarction) ([place])   . 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 Parkinson's disease    Past Surgical History:  Procedure Laterality Date  . APPENDECTOMY  1952  . COLONOSCOPY    . EYE SURGERY  07/2017   Cataract Sx  . LEFT HEART CATH AND CORONARY ANGIOGRAPHY N/A 08/03/2016   Procedure: Left Heart Cath and Coronary Angiography;  Surgeon: Leonie Man, MD;  Location: [place] CV LAB;  Service: Cardiovascular;  Laterality: N/A;  . MELANOMA EXCISION Right 2000   right arm  . PACEMAKER PLACEMENT  05/2012  . PACEMAKER REVISION N/A 03/14/2012   Procedure: PACEMAKER REVISION;  Surgeon: Thompson Grayer, MD;  Location: [facility] CATH LAB;  Service: Cardiovascular;  Laterality: N/A;  . PERMANENT PACEMAKER INSERTION N/A 03/06/2012   Procedure: PERMANENT PACEMAKER INSERTION;  Surgeon: Deboraha Sprang, MD;  Location: [facility] CATH LAB;  Service: Cardiovascular;  Laterality: N/A;  . 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 She has never used smokeless tobacco. She reports that she does not drink alcohol or use drugs. family history includes Alcohol abuse in her father; Breast cancer in an other family member; Diabetes in her brother; Heart attack (age of onset: 65) in her mother; Hypothyroidism in  her daughter and daughter. No Known Allergies   Review of Systems  Eyes: Negative for photophobia, pain, discharge, redness, itching and visual disturbance.       Objective:   Physical Exam Vitals signs reviewed.  Constitutional:      Appearance: Normal appearance.  Eyes:     Conjunctiva/sclera: Conjunctivae normal.     Pupils: Pupils are equal, round, and reactive to light.     Comments: Pupils equal round reactive to light.  Fundi benign.  Fluorescein applied to left eye.  Previous area of uptake in her cornea which was around the 12 o'clock position near the border with her iris has fully cleared.  There is no uptake of fluorescein at this time  Neurological:     Mental Status: She is alert.        Assessment:     Left corneal abrasion fully healed at this time    Plan:     -Reassurance.  Follow-up for any recurrent pain or other concerns  [name] MD [facility]

## 2018-07-28 NOTE — Patient Instructions (Signed)
Corneal Abrasion    A corneal abrasion is a scratch or injury to the clear covering over the front of your eye (cornea). Your cornea forms a clear dome that protects your eye and helps to focus your vision. Your cornea is made up of many layers. The surface layer is a single layer of cells (corneal epithelium). It is one of the most sensitive tissues in your body. A corneal abrasion can be very painful.  If a corneal abrasion is not treated, it can become infected and cause an ulcer. This can lead to scarring. A scarred cornea can affect your vision. Sometimes abrasions come back in the same area, even after the original injury has healed (recurrent erosion syndrome).  What are the causes?  This condition may be caused by:  · A poke in the eye.  · A gritty or irritating substance (foreign body) in the eye.  · Excessive eye rubbing.  · Very dry eyes.  · Certain eye infections.  · Contact lenses that fit poorly or are worn for a long period of time. You can also injure your cornea when putting contacts lenses in your eye or taking them out.  · Eye surgery.  Sometimes, the cause is unknown.  What are the signs or symptoms?  Symptoms of this condition include:  · Eye pain. The pain may get worse when your eye is open or when you move your eye.  · A feeling of something stuck in your eye.  · Having trouble keeping your eye open, or not being able to keep it open.  · Tearing and redness.  · Sensitivity to light.  · Blurred vision.  · Headache.  How is this diagnosed?  This condition may be diagnosed based on:  · Your medical history.  · Your symptoms.  · An eye exam. You may work with a health care provider who specializes in diseases and conditions of the eye (ophthalmologist). Before the eye exam, numbing drops may be put into your eye. You may also have dye put in your eye with a dropper or a small paper strip. The dye makes the abrasion easy to see when your ophthalmologist examines your eye with a light. Your  ophthalmologist may look at your eye through an eye scope (slit lamp).  How is this treated?  Treatment may vary depending on the cause of your condition, and it may include:  · Washing out your eye.  · Removing any foreign body.  · Antibiotic drops or ointment to treat an infection.  · Steroid drops or ointment to treat redness, irritation, or inflammation.  · Pain medicine.  · An eye patch to keep your eye closed.  Follow these instructions at home:  Medicines  · Use eye drops or ointments as told by your eye care provider.  · If you were prescribed antibiotic drops or ointment, use them as told by your eye care provider. Do not stop using the antibiotic even if you start to feel better.  · Take over-the-counter and prescription medicines only as told by your eye care provider.  · Do not drive or use heavy machinery while taking prescription pain medicine.  General instructions  · If you have an eye patch, wear it as told by your eye care provider.  ? Do not drive or use machinery while wearing an eye patch. Your ability to judge distances will be impaired.  ? Follow instructions from your eye care provider about when to remove the patch.  ·   Ask your eye care provider whether you can use a cold, wet cloth (compress) on your eye to relieve pain.  · Do not rub or touch your eye. Do not wash out your eye.  · Do not wear contact lenses until your eye care provider says that this is okay.  · Avoid bright light and eye strain.  · Keep all follow-up visits as told by your eye care provider. This is important for preventing infection and vision loss.  Contact a health care provider if:  · You continue to have eye pain and other symptoms for more than 2 days.  · You develop new symptoms, such as redness, tearing, or discharge.  · You have discharge that makes your eyelids stick together in the morning.  · Your eye patch becomes so loose that you can blink your eye.  · Symptoms return after the original abrasion has  healed.  Get help right away if:  · You have severe eye pain that does not get better with medicine.  · You have vision loss.  Summary  · A corneal abrasion is a scratch on the outer layer of the clear covering over the front of your eye (cornea).  · Corneal abrasion can cause eye pain, redness, tearing, and blurred vision.  · This condition is usually treated with medicine to prevent infection and scarring. You also may have to wear an eye patch to cover your eye.  · Let your eye care provider know if your symptoms continue for more than 2 days.  This information is not intended to replace advice given to you by your health care provider. Make sure you discuss any questions you have with your health care provider.  Document Released: 05/07/2000 Document Revised: 04/20/2016 Document Reviewed: 04/20/2016  Elsevier Interactive Patient Education © 2019 [employer]

## 2018-08-04 NOTE — Progress Notes (Signed)
Remote pacemaker transmission.   

## 2018-08-07 ENCOUNTER — Encounter: Payer: Self-pay | Admitting: Cardiology

## 2018-10-26 ENCOUNTER — Encounter: Payer: BC Managed Care – PPO | Admitting: *Deleted

## 2018-10-27 ENCOUNTER — Telehealth: Payer: Self-pay

## 2018-10-27 NOTE — Telephone Encounter (Signed)
Left message for patient to remind of missed remote transmission.  

## 2018-11-01 ENCOUNTER — Ambulatory Visit (INDEPENDENT_AMBULATORY_CARE_PROVIDER_SITE_OTHER): Payer: Medicare HMO | Admitting: *Deleted

## 2018-11-01 DIAGNOSIS — I442 Atrioventricular block, complete: Secondary | ICD-10-CM | POA: Diagnosis not present

## 2018-11-01 LAB — CUP PACEART REMOTE DEVICE CHECK
Battery Impedance: 542 Ohm
Battery Remaining Longevity: 81 mo
Battery Voltage: 2.78 V
Brady Statistic AP VP Percent: 28 %
Brady Statistic AP VS Percent: 0 %
Brady Statistic AS VP Percent: 71 %
Brady Statistic AS VS Percent: 1 %
Date Time Interrogation Session: 20200609205827
Implantable Lead Implant Date: 20131014
Implantable Lead Implant Date: 20131014
Implantable Lead Location: 753859
Implantable Lead Location: 753860
Implantable Lead Model: 5076
Implantable Lead Model: 5076
Implantable Pulse Generator Implant Date: 20131014
Lead Channel Impedance Value: 443 Ohm
Lead Channel Impedance Value: 631 Ohm
Lead Channel Pacing Threshold Amplitude: 0.375 V
Lead Channel Pacing Threshold Amplitude: 0.5 V
Lead Channel Pacing Threshold Pulse Width: 0.4 ms
Lead Channel Pacing Threshold Pulse Width: 0.4 ms
Lead Channel Setting Pacing Amplitude: 2 V
Lead Channel Setting Pacing Amplitude: 2.5 V
Lead Channel Setting Pacing Pulse Width: 0.4 ms
Lead Channel Setting Sensing Sensitivity: 4 mV

## 2018-11-09 ENCOUNTER — Encounter: Payer: Self-pay | Admitting: Cardiology

## 2018-11-09 NOTE — Progress Notes (Signed)
Remote pacemaker transmission.   

## 2019-01-01 DIAGNOSIS — R490 Dysphonia: Secondary | ICD-10-CM | POA: Diagnosis not present

## 2019-01-01 DIAGNOSIS — Z79899 Other long term (current) drug therapy: Secondary | ICD-10-CM | POA: Diagnosis not present

## 2019-01-01 DIAGNOSIS — G20 Parkinson's disease: Secondary | ICD-10-CM | POA: Diagnosis not present

## 2019-01-19 ENCOUNTER — Encounter: Payer: Self-pay | Admitting: Family Medicine

## 2019-01-19 ENCOUNTER — Other Ambulatory Visit: Payer: Self-pay

## 2019-01-19 ENCOUNTER — Ambulatory Visit (INDEPENDENT_AMBULATORY_CARE_PROVIDER_SITE_OTHER): Payer: Medicare HMO | Admitting: Family Medicine

## 2019-01-19 VITALS — Temp 98.7°F | Ht 61.2 in | Wt 120.0 lb

## 2019-01-19 DIAGNOSIS — G20 Parkinson's disease: Secondary | ICD-10-CM

## 2019-01-19 DIAGNOSIS — K5900 Constipation, unspecified: Secondary | ICD-10-CM | POA: Diagnosis not present

## 2019-01-19 NOTE — Progress Notes (Signed)
Patient ID: Lindsey Bryant, female   DOB: 07/23/1940, 78 y.o.   MRN: [id number]  This visit type was conducted due to national recommendations for restrictions regarding the COVID-19 pandemic in an effort to limit this patient's exposure and mitigate transmission in our community.   Virtual Visit via Telephone Note  I connected with Jayleah Lovaglio on 01/19/19 at 11:30 AM EDT by telephone and verified that I am speaking with the correct person using two identifiers.   I discussed the limitations, risks, security and privacy concerns of performing an evaluation and management service by telephone and the availability of in person appointments. I also discussed with the patient that there may be a patient responsible charge related to this service. The patient expressed understanding and agreed to proceed.  Location patient: home Location provider: work or home office Participants present for the call: patient, provider Patient did not have a visit in the prior 7 days to address this/these issue(s).   History of Present Illness: Lindsey Bryant has chronic problems including history of CAD, irritable bowel syndrome, Parkinson's disease, and remote history of melanoma.  She is followed for her Parkinson's by neurologist over at [facility].  Her neurologist recently moved to [facility] and they were trying to get a replacement.  She recently saw PA over there.  She had recent increased dose of Sinemet.  She had considered possibly switching to neurology here more locally but is still undecided at this time  She has had some recent issues with constipation.  She states she tends to go about 2 days with constipation followed by some loose stools and occasional diarrhea.  No bloody stools.  Takes MiraLAX frequently for constipation.  She is exercising less because of pandemic restrictions and thinks that may be affecting her bowel movements.  No other dietary changes.  She states she has occasional abdominal discomfort after  bowel movements.  No appetite or weight changes   Observations/Objective: Patient sounds cheerful and well on the phone. I do not appreciate any SOB. Speech and thought processing are grossly intact. Patient reported vitals:  Assessment and Plan:  #1 intermittent constipation.  History of known IBS.  ?  Exacerbated by Sinemet -We discussed trial of fiber supplementation and goal of 25 g fiber per day -Try to pick up her exercise with walking -Continue MiraLAX as needed  #2 Parkinson's disease.  We discussed local neurology groups but she would like to think this over before making transition from [facility]  Follow Up Instructions:  -As above   [id number] 5-10 [id number] 11-20 [id number] 21-30 I did not refer this patient for an OV in the next 24 hours for this/these issue(s).  I discussed the assessment and treatment plan with the patient. The patient was provided an opportunity to ask questions and all were answered. The patient agreed with the plan and demonstrated an understanding of the instructions.   The patient was advised to call back or seek an in-person evaluation if the symptoms worsen or if the condition fails to improve as anticipated.  I provided 23 minutes of non-face-to-face time during this encounter.   [name], MD

## 2019-01-24 ENCOUNTER — Other Ambulatory Visit: Payer: Self-pay | Admitting: Cardiovascular Disease

## 2019-01-31 ENCOUNTER — Other Ambulatory Visit: Payer: Self-pay | Admitting: Family Medicine

## 2019-01-31 ENCOUNTER — Ambulatory Visit (INDEPENDENT_AMBULATORY_CARE_PROVIDER_SITE_OTHER): Payer: Medicare HMO | Admitting: *Deleted

## 2019-01-31 DIAGNOSIS — I214 Non-ST elevation (NSTEMI) myocardial infarction: Secondary | ICD-10-CM | POA: Diagnosis not present

## 2019-01-31 DIAGNOSIS — R498 Other voice and resonance disorders: Secondary | ICD-10-CM | POA: Diagnosis not present

## 2019-01-31 DIAGNOSIS — R490 Dysphonia: Secondary | ICD-10-CM | POA: Diagnosis not present

## 2019-01-31 DIAGNOSIS — G20 Parkinson's disease: Secondary | ICD-10-CM | POA: Diagnosis not present

## 2019-02-01 LAB — CUP PACEART REMOTE DEVICE CHECK
Battery Impedance: 617 Ohm
Battery Remaining Longevity: 76 mo
Battery Voltage: 2.78 V
Brady Statistic AP VP Percent: 27 %
Brady Statistic AP VS Percent: 0 %
Brady Statistic AS VP Percent: 72 %
Brady Statistic AS VS Percent: 1 %
Date Time Interrogation Session: 20200910105014
Implantable Lead Implant Date: 20131014
Implantable Lead Implant Date: 20131014
Implantable Lead Location: 753859
Implantable Lead Location: 753860
Implantable Lead Model: 5076
Implantable Lead Model: 5076
Implantable Pulse Generator Implant Date: 20131014
Lead Channel Impedance Value: 431 Ohm
Lead Channel Impedance Value: 631 Ohm
Lead Channel Pacing Threshold Amplitude: 0.375 V
Lead Channel Pacing Threshold Amplitude: 0.625 V
Lead Channel Pacing Threshold Pulse Width: 0.4 ms
Lead Channel Pacing Threshold Pulse Width: 0.4 ms
Lead Channel Sensing Intrinsic Amplitude: 1.4 mV
Lead Channel Setting Pacing Amplitude: 2 V
Lead Channel Setting Pacing Amplitude: 2.5 V
Lead Channel Setting Pacing Pulse Width: 0.4 ms
Lead Channel Setting Sensing Sensitivity: 4 mV

## 2019-02-01 NOTE — Telephone Encounter (Signed)
Last refill 07/12/2018 Last office visit 01/19/2019

## 2019-02-06 ENCOUNTER — Ambulatory Visit: Payer: Medicare HMO | Admitting: Internal Medicine

## 2019-02-06 ENCOUNTER — Telehealth: Payer: Self-pay | Admitting: Internal Medicine

## 2019-02-06 ENCOUNTER — Encounter

## 2019-02-06 NOTE — Telephone Encounter (Signed)
Appointment made

## 2019-02-06 NOTE — Telephone Encounter (Signed)
She missed appointment today  She thought it was for 9/22   We will arrange for a PM appt 9/22

## 2019-02-13 ENCOUNTER — Ambulatory Visit (INDEPENDENT_AMBULATORY_CARE_PROVIDER_SITE_OTHER): Payer: Medicare HMO | Admitting: Internal Medicine

## 2019-02-13 ENCOUNTER — Encounter: Payer: Self-pay | Admitting: Internal Medicine

## 2019-02-13 VITALS — BP 122/62 | HR 78 | Temp 97.8°F | Ht 62.0 in | Wt 117.0 lb

## 2019-02-13 DIAGNOSIS — G20 Parkinson's disease: Secondary | ICD-10-CM

## 2019-02-13 DIAGNOSIS — G20A1 Parkinson's disease without dyskinesia, without mention of fluctuations: Secondary | ICD-10-CM

## 2019-02-13 DIAGNOSIS — K582 Mixed irritable bowel syndrome: Secondary | ICD-10-CM | POA: Diagnosis not present

## 2019-02-13 DIAGNOSIS — R198 Other specified symptoms and signs involving the digestive system and abdomen: Secondary | ICD-10-CM | POA: Diagnosis not present

## 2019-02-13 DIAGNOSIS — M6281 Muscle weakness (generalized): Secondary | ICD-10-CM | POA: Diagnosis not present

## 2019-02-13 DIAGNOSIS — K5902 Outlet dysfunction constipation: Secondary | ICD-10-CM

## 2019-02-13 NOTE — Progress Notes (Signed)
Lindsey Bryant 78 y.o. 1941-02-18 [id number]  Assessment & Plan:   Encounter Diagnoses  Name Primary?  . Irritable bowel syndrome with both constipation and diarrhea Yes  . Malfunction of anal sphincter   . Abnormal defecation   . Parkinson's disease ([place])   . Muscle weakness    I think she has a combination of IBS and disordered defecation.  Medications and Parkinson disease may be having some effect certainly decreased activity due to the pandemic can be contributing as well.  Based upon the history and exam I think she is a good candidate for physical therapy of the pelvic floor and will refer.  Anticipate seeing her back in a couple of months.  High-fiber diet is recommended start Benefiber 1 tablespoon daily continue MiraLAX but may be able to taper off that from every other day on down.  Need to promote more regular defecation.  I think she is not going for 2 or 3 days and then having episodes of where she is unloading that makes things quite uncomfortable and draining.  I appreciate the opportunity to care for this patient. CC: Lindsey Post, MD      Subjective:   Chief Complaint: Constipation and diarrhea difficulty regulating bowel movements  HPI The patient is here with her husband today because of difficulty with defecation.  Lindsey Bryant describes going for a couple or few days out of the week without defecation and then she has what she describes as "defecation day".  She will have multiple bowel movements is uncomfortable until she has several bowel movements in the morning she just does not feel well and she may be sore afterwards.  Sometimes the afternoons bother her but by the evening she feels okay.  She relates some of this to an increase in Parkinson's medication.  She really has had an IBS alternating bowel movement pattern for many years however it seems to be worse.  With the pandemic she is not exercising as much.  Associated bloating at times.  Sleep is not  really disturbed.  She does not really have significant genitourinary symptoms except for occasional hernia.  No rectal bleeding.  Colonoscopy is up-to-date.  Once or twice a month for the past several months she reports that she feels weak and like she is going to faint but has not done so.  She has not talked about this with another physician yet.   She does say 3 physicians have told her to go to [facility] daily for her bowel movements but that was too much and cause loose stools and diarrhea so she is taking it about 4 days out of the week.  She has not tried fiber supplementation.  We talked and she thinks she probably does not have significant fiber in her diet.  In 2018 she was hospitalized and had an internal hernia that spontaneously resolved.  Followed at [facility] from neurology perspective neurologist left she is waiting to see about another neurologist  Wt Readings from Last 3 Encounters:  02/13/19 117 lb (53.1 kg)  01/19/19 120 lb (54.4 kg)  07/28/18 121 lb 3.2 oz (55 kg)    No Known Allergies Current Meds  Medication Sig  . aspirin EC 81 MG tablet Take 81 mg by mouth at bedtime.  . carbidopa-levodopa (SINEMET IR) 25-100 MG per tablet Take 2 tablets by mouth 6 (six) times daily.   . Cholecalciferol (VITAMIN D3) 2000 units capsule Take 2,000 Units by mouth daily.  . clidinium-chlordiazePOXIDE (LIBRAX)  5-2.5 MG capsule TAKE 1 CAPSULE BY MOUTH THREE TIMES A DAY AS NEEDED  . COCONUT OIL PO Take 6 oz by mouth daily.  Marland Kitchen docusate sodium (COLACE) 100 MG capsule Take 100 mg by mouth daily as needed for mild constipation.  Marland Kitchen lisinopril (ZESTRIL) 5 MG tablet Take 1 tablet (5 mg total) by mouth daily. Please keep upcoming appt in October with Dr. Johnsie Cancel for future refills. Thank you  . Omega-3 1000 MG CAPS Take 1 capsule by mouth daily.  . Probiotic Product (PROBIOTIC PO) Take 1 capsule by mouth daily.  Marland Kitchen Propylene Glycol (SYSTANE COMPLETE OP) Apply 1 drop to eye daily.  Marland Kitchen tobramycin  (TOBREX) 0.3 % ophthalmic solution Place 2 drops into the left eye every 4 (four) hours.   Past Medical History:  Diagnosis Date  . Acute systolic heart failure ([place])   . Bradycardia    a. 02/2012  . Hyperglycemia   . IBS (irritable bowel syndrome)   . Melanoma ([place])    right arm  . NSTEMI (non-ST elevated myocardial infarction) ([place])   . 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 Parkinson's disease    Past Surgical History:  Procedure Laterality Date  . APPENDECTOMY  1952  . COLONOSCOPY    . EYE SURGERY  07/2017   Cataract Sx  . LEFT HEART CATH AND CORONARY ANGIOGRAPHY N/A 08/03/2016   Procedure: Left Heart Cath and Coronary Angiography;  Surgeon: Leonie Man, MD;  Location: [facility];  Service: Cardiovascular;  Laterality: N/A;  . MELANOMA EXCISION Right 2000   right arm  . PACEMAKER PLACEMENT  05/2012  . PACEMAKER REVISION N/A 03/14/2012   Procedure: PACEMAKER REVISION;  Surgeon: Thompson Grayer, MD;  Location: [facility] CATH LAB;  Service: Cardiovascular;  Laterality: N/A;  . PERMANENT PACEMAKER INSERTION N/A 03/06/2012   Procedure: PERMANENT PACEMAKER INSERTION;  Surgeon: Deboraha Sprang, MD;  Location: [facility] CATH LAB;  Service: Cardiovascular;  Laterality: N/A;  . 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 2 daughters I think.   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   family history includes Alcohol abuse in her father; Breast cancer in an other family member; Diabetes in her brother; Heart attack (age of onset: 11) in her mother; Hypothyroidism in her daughter and daughter.   Review of Systems  As per HPI Objective:   Physical Exam BP 122/62   Pulse 78   Temp 97.8 F (36.6 C)   Ht 5\' 2"  (1.575 m)   Wt 117 lb (53.1 kg)   BMI 21.40 kg/m   Petite elderly white woman in no acute distress Eyes are anicteric The abdomen is soft and nontender  Rectal exam with Lindsey Bryant, CMA present demonstrates small anal tags positive anal wink decreased resting and voluntary squeeze tone of the anal canal and rectum, no mass no significant rectocele.  Simulated defecation shows decreased abdominal contraction with decreased descent and decreased relaxation of the anal canal  Parkinson tremors present she is alert and oriented x3 has appropriate mood and affect.  She is a bit bradykinetic.

## 2019-02-13 NOTE — Assessment & Plan Note (Signed)
Pelvic PT Fiber

## 2019-02-13 NOTE — Patient Instructions (Signed)
You will be hearing from the [facility] rehab facility to schedule an appointment.  Begin taking 1 tablespoon of Benefiber at night.  Take Miralax every other day.

## 2019-02-15 NOTE — Progress Notes (Signed)
Remote pacemaker transmission.   

## 2019-02-16 ENCOUNTER — Telehealth: Payer: Self-pay | Admitting: Internal Medicine

## 2019-02-16 DIAGNOSIS — L570 Actinic keratosis: Secondary | ICD-10-CM | POA: Diagnosis not present

## 2019-02-16 DIAGNOSIS — L814 Other melanin hyperpigmentation: Secondary | ICD-10-CM | POA: Diagnosis not present

## 2019-02-16 DIAGNOSIS — D1801 Hemangioma of skin and subcutaneous tissue: Secondary | ICD-10-CM | POA: Diagnosis not present

## 2019-02-16 DIAGNOSIS — L821 Other seborrheic keratosis: Secondary | ICD-10-CM | POA: Diagnosis not present

## 2019-02-19 NOTE — Telephone Encounter (Signed)
Pt called back and said to disregard the last message about switching providers.  She wants to stay with Dr. [name].

## 2019-02-20 ENCOUNTER — Ambulatory Visit: Payer: Medicare HMO | Admitting: Internal Medicine

## 2019-02-27 ENCOUNTER — Other Ambulatory Visit: Payer: Self-pay

## 2019-02-27 ENCOUNTER — Encounter: Payer: Self-pay | Admitting: Internal Medicine

## 2019-02-27 ENCOUNTER — Ambulatory Visit (INDEPENDENT_AMBULATORY_CARE_PROVIDER_SITE_OTHER): Payer: Medicare HMO | Admitting: Internal Medicine

## 2019-02-27 VITALS — BP 102/60 | HR 70 | Ht 62.0 in | Wt 116.4 lb

## 2019-02-27 DIAGNOSIS — I428 Other cardiomyopathies: Secondary | ICD-10-CM | POA: Diagnosis not present

## 2019-02-27 DIAGNOSIS — Z950 Presence of cardiac pacemaker: Secondary | ICD-10-CM

## 2019-02-27 DIAGNOSIS — I442 Atrioventricular block, complete: Secondary | ICD-10-CM | POA: Diagnosis not present

## 2019-02-27 NOTE — Patient Instructions (Signed)
Medication Instructions:  Your physician recommends that you continue on your current medications as directed. Please refer to the Current Medication list given to you today.  *If you need a refill on your cardiac medications before your next appointment, please call your pharmacy*  Labwork: None ordered If you have labs (blood work) drawn today and your tests are completely normal, you will receive your results only by:  [place] (if you have MyChart) OR  A paper copy in the mail If you have any lab test that is abnormal or we need to change your treatment, we will call you to review the results.  Testing/Procedures: None ordered  Follow-Up: Remote monitoring is used to monitor your Pacemaker or ICD from home. This monitoring reduces the number of office visits required to check your device to one time per year. It allows [place] to keep an eye on the functioning of your device to ensure it is working properly. You are scheduled for a device check from home on 05/02/19. You may send your transmission at any time that day. If you have a wireless device, the transmission will be sent automatically. After your physician reviews your transmission, you will receive a postcard with your next transmission date.  Your physician wants you to follow-up in: 1 year with Dr. Caryl Comes.  You will receive a reminder letter in the mail two months in advance. If you don't receive a letter, please call our office to schedule the follow-up appointment.  Thank you for choosing [facility]!!    Any Other Special Instructions Will Be Listed Below (If Applicable).

## 2019-02-27 NOTE — Progress Notes (Signed)
Patient Care Team: [name], MD as PCP - General (Family Medicine) Deboraha Sprang, MD as PCP - Cardiology (Cardiology) Sherlyn Lees, MD as Referring Physician (Neurology)   HPI  Lindsey Bryant is a 78 y.o. 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     Major issues include Parkinson's as an affecting her GI tract.  She is also had problems with orthostatic lightheadedness.  Blood pressures have been recorded as high as 200.  No chest pain no peripheral edema.   DATE TEST EF   5/14 Echo  55 %   3/18 Cath  Non Obstructive CAs  3/18 Echo  30-35   9/18 Echo  45-50%    8/19 Echo  50-55%       Date Cr K  8/18 0.75 4.6   10/19 0.72 4.6       Past Medical History:  Diagnosis Date  . Acute systolic heart failure ([place])   . Bradycardia    a. 02/2012  . Hyperglycemia   . IBS (irritable bowel syndrome)   . Melanoma ([place])    right arm  . NSTEMI (non-ST elevated myocardial infarction) ([place])   . 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 Parkinson's disease     Past Surgical History:  Procedure Laterality Date  . APPENDECTOMY  1952  . COLONOSCOPY    . EYE SURGERY  07/2017   Cataract Sx  . LEFT HEART CATH AND CORONARY ANGIOGRAPHY N/A 08/03/2016   Procedure: Left Heart Cath and Coronary Angiography;  Surgeon: Leonie Man, MD;  Location: [facility];  Service: Cardiovascular;  Laterality: N/A;  . MELANOMA EXCISION Right 2000   right arm  . PACEMAKER PLACEMENT  05/2012  . PACEMAKER REVISION N/A 03/14/2012   Procedure: PACEMAKER REVISION;  Surgeon: Thompson Grayer, MD;  Location: [facility] CATH LAB;  Service: Cardiovascular;  Laterality: N/A;  . PERMANENT PACEMAKER INSERTION N/A 03/06/2012   Procedure: PERMANENT PACEMAKER INSERTION;  Surgeon: Deboraha Sprang, MD;  Location: [facility] CATH LAB;  Service: Cardiovascular;  Laterality: N/A;  . 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     Current Outpatient Medications  Medication Sig Dispense Refill  . aspirin EC 81 MG tablet Take 81 mg by mouth at bedtime.    . carbidopa-levodopa (SINEMET IR) 25-100 MG per tablet Take 2 tablets by mouth 6 (six) times daily.     . Cholecalciferol (VITAMIN D3) 2000 units capsule Take 2,000 Units by mouth daily.    . clidinium-chlordiazePOXIDE (LIBRAX) 5-2.5 MG capsule TAKE 1 CAPSULE BY MOUTH THREE TIMES A DAY AS NEEDED 30 capsule 0  . COCONUT OIL PO Take 6 oz by mouth daily.    Marland Kitchen docusate sodium (COLACE) 100 MG capsule Take 100 mg by mouth daily as needed for mild constipation.    Marland Kitchen lisinopril (ZESTRIL) 5 MG tablet Take 1 tablet (5 mg total) by mouth daily. Please keep upcoming appt in October with Dr. Johnsie Cancel for future refills. Thank you 90 tablet 0  . Omega-3 1000 MG CAPS Take 1 capsule by mouth daily.    . Probiotic Product (PROBIOTIC PO) Take 1 capsule by mouth daily.    Marland Kitchen Propylene Glycol (SYSTANE COMPLETE OP) Apply 1 drop to eye daily.    . Wheat Dextrin (BENEFIBER) POWD Take 1 Scoop by mouth every other day.     No current facility-administered medications for this visit.  No Known Allergies  Review of Systems negative except from HPI and PMH  Physical Exam BP 102/60   Pulse 70   Ht 5\' 2"  (1.575 m)   Wt 116 lb 6.4 oz (52.8 kg)   SpO2 96%   BMI 21.29 kg/m  Well developed and well nourished in no acute distress HENT normal Neck supple with JVP-flat Clear Device pocket well healed; without hematoma or erythema.  There is no tethering  Regular rate and rhythm, no murmur Abd-soft with active BS No Clubbing cyanosis   edema Skin-warm and dry A & Oriented  Grossly normal sensory and motor function  ECG P synchronous pacing at 70 occasional premature beats likely junctional   Assessment and  Plan  Complete heart block  Narrow escape  NICM  Post prandial Fatigue/Parkinson's   Pacemaker Medtronic   Orthostatic Hypotension    Pt has severe orthostatic  hypotension.  We have discussed the physiology.  We have elected to pursue compressive wear prior to pharmacologic therapies.  Specifically, thigh sleeves and abdominal binders prior to ProAmatine and/or Mestinon.  I suspect she has severe systolic hypertension although is only 140 today.  She has had recorded blood pressures in the 200s as noted previously.   We discussed the role of isometric contraction as well as volume repletion; raising the head of the bed 4-6 inches.   We spent more than 50% of our >25 min visit in face to face counseling regarding the above

## 2019-02-28 LAB — CUP PACEART INCLINIC DEVICE CHECK
Battery Impedance: 568 Ohm
Battery Remaining Longevity: 80 mo
Battery Voltage: 2.78 V
Brady Statistic AP VP Percent: 27 %
Brady Statistic AP VS Percent: 0 %
Brady Statistic AS VP Percent: 72 %
Brady Statistic AS VS Percent: 1 %
Date Time Interrogation Session: 20201006191019
Implantable Lead Implant Date: 20131014
Implantable Lead Implant Date: 20131014
Implantable Lead Location: 753859
Implantable Lead Location: 753860
Implantable Lead Model: 5076
Implantable Lead Model: 5076
Implantable Pulse Generator Implant Date: 20131014
Lead Channel Impedance Value: 415 Ohm
Lead Channel Impedance Value: 646 Ohm
Lead Channel Pacing Threshold Amplitude: 0.5 V
Lead Channel Pacing Threshold Amplitude: 0.5 V
Lead Channel Pacing Threshold Amplitude: 0.625 V
Lead Channel Pacing Threshold Amplitude: 0.75 V
Lead Channel Pacing Threshold Pulse Width: 0.4 ms
Lead Channel Pacing Threshold Pulse Width: 0.4 ms
Lead Channel Pacing Threshold Pulse Width: 0.4 ms
Lead Channel Pacing Threshold Pulse Width: 0.4 ms
Lead Channel Sensing Intrinsic Amplitude: 2.8 mV
Lead Channel Setting Pacing Amplitude: 2 V
Lead Channel Setting Pacing Amplitude: 2.5 V
Lead Channel Setting Pacing Pulse Width: 0.4 ms
Lead Channel Setting Sensing Sensitivity: 4 mV

## 2019-03-05 ENCOUNTER — Other Ambulatory Visit: Payer: Self-pay | Admitting: Family Medicine

## 2019-03-06 ENCOUNTER — Other Ambulatory Visit: Payer: Self-pay | Admitting: Family Medicine

## 2019-03-07 ENCOUNTER — Ambulatory Visit: Payer: Medicare HMO | Admitting: Cardiovascular Disease

## 2019-03-08 ENCOUNTER — Ambulatory Visit: Payer: Medicare HMO | Admitting: Physical Therapy

## 2019-03-20 ENCOUNTER — Ambulatory Visit: Payer: Medicare HMO | Admitting: Physical Therapy

## 2019-03-26 ENCOUNTER — Ambulatory Visit: Payer: Medicare HMO | Attending: Internal Medicine | Admitting: Physical Therapy

## 2019-03-26 ENCOUNTER — Encounter: Payer: Self-pay | Admitting: Physical Therapy

## 2019-03-26 ENCOUNTER — Other Ambulatory Visit: Payer: Self-pay

## 2019-03-26 DIAGNOSIS — R279 Unspecified lack of coordination: Secondary | ICD-10-CM

## 2019-03-26 DIAGNOSIS — R198 Other specified symptoms and signs involving the digestive system and abdomen: Secondary | ICD-10-CM | POA: Diagnosis not present

## 2019-03-26 DIAGNOSIS — M6281 Muscle weakness (generalized): Secondary | ICD-10-CM

## 2019-03-26 NOTE — Patient Instructions (Signed)
About Abdominal Massage  Abdominal massage, also called external colon massage, is a self-treatment circular massage technique that can reduce and eliminate gas and ease constipation. The colon naturally contracts in waves in a clockwise direction starting from inside the right hip, moving up toward the ribs, across the belly, and down inside the left hip.  When you perform circular abdominal massage, you help stimulate your colon's normal wave pattern of movement called peristalsis.  It is most beneficial when done after eating.  Positioning You can practice abdominal massage with oil while lying down, or in the shower with soap.  Some people find that it is just as effective to do the massage through clothing while sitting or standing.  How to Massage Start by placing your finger tips or knuckles on your right side, just inside your hip bone.  . Make small circular movements while you move upward toward your rib cage.   . Once you reach the bottom right side of your rib cage, take your circular movements across to the left side of the bottom of your rib cage.  . Next, move downward until you reach the inside of your left hip bone.  This is the path your feces travel in your colon. . Continue to perform your abdominal massage in this pattern for 10 minutes each day.     You can apply as much pressure as is comfortable in your massage.  Start gently and build pressure as you continue to practice.  Notice any areas of pain as you massage; areas of slight pain may be relieved as you massage, but if you have areas of significant or intense pain, consult with your healthcare provider.  Other Considerations . General physical activity including bending and stretching can have a beneficial massage-like effect on the colon.  Deep breathing can also stimulate the colon because breathing deeply activates the same nervous system that supplies the colon.   . Abdominal massage should always be used in  combination with a bowel-conscious diet that is high in the proper type of fiber for you, fluids (primarily water), and a regular exercise program. Toileting Techniques for Bowel Movements (Defecation) Using your belly (abdomen) and pelvic floor muscles to have a bowel movement is usually instinctive.  Sometimes people can have problems with these muscles and have to relearn proper defecation (emptying) techniques.  If you have weakness in your muscles, organs that are falling out, decreased sensation in your pelvis, or ignore your urge to go, you may find yourself straining to have a bowel movement.  You are straining if you are: . holding your breath or taking in a huge gulp of air and holding it  . keeping your lips and jaw tensed and closed tightly . turning red in the face because of excessive pushing or forcing . developing or worsening your  hemorrhoids . getting faint while pushing . not emptying completely and have to defecate many times a day  If you are straining, you are actually making it harder for yourself to have a bowel movement.  Many people find they are pulling up with the pelvic floor muscles and closing off instead of opening the anus. Due to lack pelvic floor relaxation and coordination the abdominal muscles, one has to work harder to push the feces out.  Many people have never been taught how to defecate efficiently and effectively.  Notice what happens to your body when you are having a bowel movement.  While you are sitting on the   toilet pay attention to the following areas: . Jaw and mouth position . Angle of your hips   . Whether your feet touch the ground or not . Arm placement  . Spine position . Waist . Belly tension . Anus (opening of the anal canal)  An Evacuation/Defecation Plan   Here are the 4 basic points:  1. Lean forward enough for your elbows to rest on your knees 2. Support your feet on the floor or use a low stool if your feet don't touch the floor   3. Push out your belly as if you have swallowed a beach ball-you should feel a widening of your waist 4. Open and relax your pelvic floor muscles, rather than tightening around the anus       The following conditions my require modifications to your toileting posture:  . If you have had surgery in the past that limits your back, hip, pelvic, knee or ankle flexibility . Constipation   Your healthcare practitioner may make the following additional suggestions and adjustments:  1) Sit on the toilet  a) Make sure your feet are supported. b) Notice your hip angle and spine position-most people find it effective to lean forward or raise their knees, which can help the muscles around the anus to relax  c) When you lean forward, place your forearms on your thighs for support  2) Relax suggestions a) Breath deeply in through your nose and out slowly through your mouth as if you are smelling the flowers and blowing out the candles. b) To become aware of how to relax your muscles, contracting and releasing muscles can be helpful.  Pull your pelvic floor muscles in tightly by using the image of holding back gas, or closing around the anus (visualize making a circle smaller) and lifting the anus up and in.  Then release the muscles and your anus should drop down and feel open. Repeat 5 times ending with the feeling of relaxation. c) Keep your pelvic floor muscles relaxed; let your belly bulge out. d) The digestive tract starts at the mouth and ends at the anal opening, so be sure to relax both ends of the tube.  Place your tongue on the roof of your mouth with your teeth separated.  This helps relax your mouth and will help to relax the anus at the same time.  3) Empty (defecation) a) Keep your pelvic floor and sphincter relaxed, then bulge your anal muscles.  Make the anal opening wide.  b) Stick your belly out as if you have swallowed a beach ball. c) Make your belly wall hard using your belly  muscles while continuing to breathe. Doing this makes it easier to open your anus. d) Breath out and give a grunt (or try using other sounds such as ahhhh, shhhhh, ohhhh or grrrrrrr).  4) Finish a) As you finish your bowel movement, pull the pelvic floor muscles up and in.  This will leave your anus in the proper place rather than remaining pushed out and down. If you leave your anus pushed out and down, it will start to feel as though that is normal and give you incorrect signals about needing to have a bowel movement. [facility] [address], [place], [place] [address] Phone # [phone] Fax [phone]   Can get a squatty potty on website , [place] [place] or bed bath and beyond

## 2019-03-26 NOTE — Therapy (Signed)
[facility] [address] [place], [place], [address] Phone: [phone]   Fax:  [phone]  Physical Therapy Evaluation  Patient Details  Name: Lindsey Bryant MRN: [id number] Date of Birth: 08-Mar-1941 Referring Provider (PT): Dr. Silvano Rusk   Encounter Date: 03/26/2019  PT End of Session - 03/26/19 1521    Visit Number  1    Date for PT Re-Evaluation  05/21/19    Authorization Type  [employer]na Medicare    PT Start Time  [id number]    PT Stop Time  1520    PT Time Calculation (min)  35 min    Activity Tolerance  Patient tolerated treatment well;No increased pain    Behavior During Therapy  WFL for tasks assessed/performed       Past Medical History:  Diagnosis Date  . Acute systolic heart failure ([place])   . Bradycardia    a. 02/2012  . Hyperglycemia   . IBS (irritable bowel syndrome)   . Melanoma ([place])    right arm  . NSTEMI (non-ST elevated myocardial infarction) ([place])   . 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 Parkinson's disease     Past Surgical History:  Procedure Laterality Date  . APPENDECTOMY  1952  . COLONOSCOPY    . EYE SURGERY  07/2017   Cataract Sx  . LEFT HEART CATH AND CORONARY ANGIOGRAPHY N/A 08/03/2016   Procedure: Left Heart Cath and Coronary Angiography;  Surgeon: Leonie Man, MD;  Location: [facility];  Service: Cardiovascular;  Laterality: N/A;  . MELANOMA EXCISION Right 2000   right arm  . PACEMAKER PLACEMENT  05/2012  . PACEMAKER REVISION N/A 03/14/2012   Procedure: PACEMAKER REVISION;  Surgeon: Thompson Grayer, MD;  Location: [facility] CATH LAB;  Service: Cardiovascular;  Laterality: N/A;  . PERMANENT PACEMAKER INSERTION N/A 03/06/2012   Procedure: PERMANENT PACEMAKER INSERTION;  Surgeon: Deboraha Sprang, MD;  Location: [facility] CATH LAB;  Service: Cardiovascular;  Laterality: N/A;  . 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   Subjective Assessment - 03/26/19 1450    Subjective  Patient has had IBS since H.S. Parkinsons 14 years ago. Patient started constipation since she had started the Parkinsons medication. Patient reports her bowel movements have been worse in past 5 months. Patient is taking the miralax every other day and Benefiber the other days. In the morning patient will stay by the commode for several hours. If patient goes 3 times she will feel weak. Patient has to strain to have a bowel movement 50% of the time.    Patient Stated Goals  Be done with having a bowel movement by 9 AM to do other things during the day.    Currently in Pain?  Yes    Pain Score  4     Pain Location  Abdomen    Pain Orientation  Lower    Pain Descriptors / Indicators  Aching;Discomfort    Pain Type  Acute pain    Pain Onset  More than a month ago    Pain Frequency  Intermittent    Aggravating Factors   not sure    Pain Relieving Factors  yoga poses help, Exlax    Multiple Pain Sites  No         [facility] PT Assessment - 03/26/19 0001      Assessment   Medical Diagnosis  K58.2 irritable bowel  syndrome with both constipation and diarrhea ;R19.8 Malfunction of anal sphncter; R19.8 Abnormal defecation; M62.81 Muscle weakness    Referring Provider (PT)  Dr. Silvano Rusk    Onset Date/Surgical Date  11/02/18    Prior Therapy  none      Precautions   Precautions  ICD/Pacemaker;Other (comment)    Precaution Comments  melanoma; parkinsons      Restrictions   Weight Bearing Restrictions  No      Balance Screen   Has the patient fallen in the past 6 months  No    Has the patient had a decrease in activity level because of a fear of falling?   No    Is the patient reluctant to leave their home because of a fear of falling?   No      Home Film/video editor residence      Prior Function   Level of Independence  Independent    Leisure  water aerobic      Cognition   Overall Cognitive  Status  Within Functional Limits for tasks assessed      Posture/Postural Control   Posture/Postural Control  No significant limitations      ROM / Strength   AROM / PROM / Strength  AROM;PROM;Strength      PROM   Right Hip External Rotation   45    Right Hip Internal Rotation   10    Left Hip External Rotation   30    Left Hip Internal Rotation   10      Strength   Right Hip Flexion  4/5    Right Hip Extension  3+/5    Left Hip Flexion  4/5    Left Hip Extension  3+/5    Left Hip ABduction  3+/5      Palpation   SI assessment   pelvis in correct alignment    Palpation comment  tightness in the upper abdominal and diaphragm, decreased lower rib opeining with breath                Objective measurements completed on examination: See above findings.    Pelvic Floor Special Questions - 03/26/19 0001    Prior Pregnancies  Yes    Number of Pregnancies  2    Number of Vaginal Deliveries  2    Urinary Leakage  Yes    Pad use  during the night wears a light day pad and small amount    Fecal incontinence  No    Exam Type  Deferred   not feeling good      [facility] Adult PT Treatment/Exercise - 03/26/19 0001      Self-Care   Self-Care  Other Self-Care Comments    Other Self-Care Comments   abdominal massage to stimulate peristalic motion of the intestines      Therapeutic Activites    Therapeutic Activities  Other Therapeutic Activities    Other Therapeutic Activities  instruction on correct toileting to have a bowel movement              PT Education - 03/26/19 1521    Education Details  information on toileting and abdominal massage    Person(s) Educated  Patient    Methods  Explanation;Demonstration;Handout    Comprehension  Verbalized understanding;Returned demonstration       PT Short Term Goals - 03/26/19 1527      PT SHORT TERM GOAL #1   Title  independent  with initial HEP    Time  4    Period  Weeks    Status  New    Target Date  04/23/19       PT SHORT TERM GOAL #2   Title  able to perfrom diaphragmatic breathing to elongate the pelvic floor for a bowel movement    Time  4    Period  Weeks    Status  New    Target Date  04/23/19      PT SHORT TERM GOAL #3   Title  understand toileting technique to relax the pelvic floor, knees above hips and correct breath    Time  4    Period  Weeks    Status  New    Target Date  04/23/19        PT Long Term Goals - 03/26/19 1529      PT LONG TERM GOAL #1   Title  independent with advanced HEP    Time  8    Period  Weeks    Status  New    Target Date  05/21/19      PT LONG TERM GOAL #2   Title  able to complete her bowel movement with straining decreased >/= 60% so she is able to fully empty    Time  8    Period  Weeks    Status  New    Target Date  05/21/19      PT LONG TERM GOAL #3   Title  able to complete her bowel movements prior to 9:00 AM so she is able to attend her painting class    Time  8    Period  Weeks    Status  New    Target Date  05/21/19      PT LONG TERM GOAL #4   Title  able to have a complete bowel movement on the days for swimming prior to 10:00AM so she is able to attend her water aerobics class    Time  8    Period  Weeks    Status  New    Target Date  05/21/19             Plan - 03/26/19 1522    Clinical Impression Statement  Patient is a 78 year old female with abnormal defecation for the past 6 months. Patient has to strain to have a bowel movement. Patient reports aching pain in lower abdomen due to gas at level 4/10. Patient has tightness in her diaphragm and upper abdomen. Patient has weakness in her hips. Patient deferred internal anal sphincter assessment due to soreness in the anus. Patient will have a bowel movement every 2-3 days. When she has a bowel movement she will have to go several times and not be able to leave the house for her water aerobics or her painting class. Patient has a history of Parkinsons, pacemaker, and  melanoma. Patient will benefit from skilled therapy to improve coordination for a bowel movement and ablility to relax her pelvic floor.    Personal Factors and Comorbidities  Age;Comorbidity 1;Comorbidity 3+    Comorbidities  Parkinsons, Pacemaker, Melanoma; IBS    Examination-Activity Limitations  Toileting    Examination-Participation Restrictions  Community Activity;Interpersonal Relationship    Stability/Clinical Decision Making  Evolving/Moderate complexity    Clinical Decision Making  Moderate    Rehab Potential  Good    PT Frequency  1x / week    PT Duration  8 weeks    PT Treatment/Interventions  Biofeedback;Therapeutic exercise;Therapeutic activities;Functional mobility training;Neuromuscular re-education;Patient/family education;Dry needling;Manual techniques    PT Next Visit Plan  diaphragmatic breathing, bowel health, abdominal massage, work on diaphragm, hip stretches    Consulted and Agree with Plan of Care  Patient       Patient will benefit from skilled therapeutic intervention in order to improve the following deficits and impairments:  Decreased coordination, Increased fascial restricitons, Pain, Decreased activity tolerance, Decreased strength  Visit Diagnosis: Unspecified lack of coordination - Plan: PT plan of care cert/re-cert  Muscle weakness (generalized) - Plan: PT plan of care cert/re-cert  Abnormal defecation - Plan: PT plan of care cert/re-cert     Problem List Patient Active Problem List   Diagnosis Date Noted  . Obstructed internal hernia   . Elevated TSH 07/29/2016  . NSTEMI (non-ST elevated myocardial infarction) ([place])   . 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 MENOPAUSAL SYNDROME 09/17/2009  . 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 Parkinson's disease ([place]) 11/21/2006  . Irritable bowel syndrome 11/21/2006    Earlie Counts, PT 03/26/19 3:35 PM   [facility] [address], [place], [place], [address] Phone: [phone]   Fax:  [phone]  Name: KAYLIA COWING MRN: [id number] Date of Birth: 1941-02-18

## 2019-04-02 ENCOUNTER — Ambulatory Visit: Payer: Medicare HMO | Admitting: Physical Therapy

## 2019-04-02 ENCOUNTER — Other Ambulatory Visit: Payer: Self-pay

## 2019-04-02 ENCOUNTER — Encounter: Payer: Self-pay | Admitting: Physical Therapy

## 2019-04-02 DIAGNOSIS — M6281 Muscle weakness (generalized): Secondary | ICD-10-CM

## 2019-04-02 DIAGNOSIS — R198 Other specified symptoms and signs involving the digestive system and abdomen: Secondary | ICD-10-CM | POA: Diagnosis not present

## 2019-04-02 DIAGNOSIS — R279 Unspecified lack of coordination: Secondary | ICD-10-CM

## 2019-04-02 NOTE — Patient Instructions (Addendum)
Introduction to Bowel Health Diet and daily habits can help you predict when your bowels will move on a regular basis.  The consistency and quantity of the stool is usually more important than the frequency.  The goal is to have a regular bowel movement that is soft but formed.   Tips on Emptying Regularly . Eat breakfast.  Usually the best time of day for a bowel movement will be a half hour to an hour after eating.  These times are best because the body uses the gastrocolic reflex, a stimulation of bowel motion that occurs with eating, to help produce a bowel movement.  For some people even a simple hot drink in the morning can help the reflex action begin. . Eat all your meals at a predictable time each day.  The bowel functions best when food is introduced at the same regular intervals. . The amount of food eaten at a given time of day should be about the same size from day to day.  The bowel functions best when food is introduced in similar quantities from day to day. It is fine to have a small breakfast and a large lunch, or vice versa, just be consistent. . Eat two servings of fruit or vegetables and at least one serving of a complex carbohydrates (whole grains such as brown rice, bran, whole wheat bread, or oatmeal) at each meal. . Drink plenty of water-ideally eight glasses a day.  Be sure to increase your water intake if you are increasing fiber into your diet. (60ounces) . Sit up tall, both feet on the floor  Maintain Healthy Habits . Exercise daily.  You may exercise at any time of day, but you may find that bowel function is helped most if the exercise is at a consistent time each day. . Make sure that you are not rushed and have convenient access to a bathroom at your selected time to empty your bowels.  Drink 60 ounces of water  .  Hook-Lying    Lie with hips and knees bent. Allow body's muscles to relax. Place hands on belly. Inhale slowly and deeply for _3__ seconds, so hands  move up. Then take _3__ seconds to exhale. Repeat 10___ times. Do _3__ times a day.   Copyright  VHI. All rights reserved.  [facility] [address] [place], [place] [address] Phone # [phone] Fax [phone]

## 2019-04-02 NOTE — Therapy (Signed)
[facility] [address], [place], [place], [address] Phone: [phone]   Fax:  [phone]  Physical Therapy Treatment  Patient Details  Name: Lindsey Bryant MRN: [id number] Date of Birth: 1940-07-31 Referring Provider (PT): Dr. Silvano Rusk   Encounter Date: 04/02/2019  PT End of Session - 04/02/19 1318    Visit Number  2    Date for PT Re-Evaluation  05/21/19    Authorization Type  Aetna Medicare    PT Start Time  1230    PT Stop Time  1310    PT Time Calculation (min)  40 min    Activity Tolerance  Patient tolerated treatment well;No increased pain    Behavior During Therapy  WFL for tasks assessed/performed       Past Medical History:  Diagnosis Date  . Acute systolic heart failure ([place])   . Bradycardia    a. 02/2012  . Hyperglycemia   . IBS (irritable bowel syndrome)   . Melanoma ([place])    right arm  . NSTEMI (non-ST elevated myocardial infarction) ([place])   . 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 Parkinson's disease     Past Surgical History:  Procedure Laterality Date  . APPENDECTOMY  1952  . COLONOSCOPY    . EYE SURGERY  07/2017   Cataract Sx  . LEFT HEART CATH AND CORONARY ANGIOGRAPHY N/A 08/03/2016   Procedure: Left Heart Cath and Coronary Angiography;  Surgeon: Leonie Man, MD;  Location: [facility];  Service: Cardiovascular;  Laterality: N/A;  . MELANOMA EXCISION Right 2000   right arm  . PACEMAKER PLACEMENT  05/2012  . PACEMAKER REVISION N/A 03/14/2012   Procedure: PACEMAKER REVISION;  Surgeon: Thompson Grayer, MD;  Location: [facility] CATH LAB;  Service: Cardiovascular;  Laterality: N/A;  . PERMANENT PACEMAKER INSERTION N/A 03/06/2012   Procedure: PERMANENT PACEMAKER INSERTION;  Surgeon: Deboraha Sprang, MD;  Location: [facility] CATH LAB;  Service: Cardiovascular;  Laterality: N/A;  . 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  Subjective Assessment - 04/02/19 1237    Subjective  I have not had a bowel movement in a couple of days. I got the squatty potty. Last bowel movement was Saturday. No abdominal pain today but I had last night. I took a gas x and it worked.    Patient Stated Goals  Be done with having a bowel movement by 9 AM to do other things during the day.    Currently in Pain?  No/denies                       [facility] Adult PT Treatment/Exercise - 04/02/19 0001      Self-Care   Self-Care  Other Self-Care Comments    Other Self-Care Comments   education on bowel health and chewing food 20 times or more to improve the gastro relex and improve digestion      Therapeutic Activites    Therapeutic Activities  Other Therapeutic Activities    Other Therapeutic Activities  sitting upright to digest her food better and reduce tightness in the pelvic floor      Manual Therapy   Manual Therapy  Myofascial release;Soft tissue mobilization    Soft tissue mobilization  circular massage to the abdomen to improve peristalic effect to help the stool move through the intestines    Myofascial Release  release of  the sac of douglas to assist stoll going into the rectum             PT Education - 04/02/19 1308    Education Details  posture; bowel health, chewing the food enough, diaphragmatic breathing    Person(s) Educated  Patient    Methods  Explanation;Demonstration;Verbal cues;Handout    Comprehension  Returned demonstration;Verbalized understanding       PT Short Term Goals - 04/02/19 1323      PT SHORT TERM GOAL #1   Title  independent with initial HEP    Time  4    Period  Weeks    Status  On-going    Target Date  04/23/19      PT SHORT TERM GOAL #2   Title  able to perfrom diaphragmatic breathing to elongate the pelvic floor for a bowel movement    Time  4    Period  Weeks    Status  On-going    Target Date  04/23/19      PT SHORT TERM GOAL #3   Title  understand  toileting technique to relax the pelvic floor, knees above hips and correct breath    Time  4    Period  Weeks    Status  On-going        PT Long Term Goals - 03/26/19 1529      PT LONG TERM GOAL #1   Title  independent with advanced HEP    Time  8    Period  Weeks    Status  New    Target Date  05/21/19      PT LONG TERM GOAL #2   Title  able to complete her bowel movement with straining decreased >/= 60% so she is able to fully empty    Time  8    Period  Weeks    Status  New    Target Date  05/21/19      PT LONG TERM GOAL #3   Title  able to complete her bowel movements prior to 9:00 AM so she is able to attend her painting class    Time  8    Period  Weeks    Status  New    Target Date  05/21/19      PT LONG TERM GOAL #4   Title  able to have a complete bowel movement on the days for swimming prior to 10:00AM so she is able to attend her water aerobics class    Time  8    Period  Weeks    Status  New    Target Date  05/21/19            Plan - 04/02/19 1234    Clinical Impression Statement  Patient had good bowel sounds today after manual work. Patient was able to demonstrate diaphragmatic breathing to elongate the pelvic floor. Pateint had some tightness in the diaphragm. Patient has not had a bowel movement since Saturday. Patient has purchased the [employer]. Patient understands to sit upright to improve digestion. Patient will benefit from skilled therapy to improve coordination for a bowel movement and ability to relax her pelvic floor.    Personal Factors and Comorbidities  Age;Comorbidity 1;Comorbidity 3+    Comorbidities  Parkinsons, Pacemaker, Melanoma; IBS    Examination-Activity Limitations  Toileting    Examination-Participation Restrictions  Community Activity;Interpersonal Relationship    Stability/Clinical Decision Making  Evolving/Moderate complexity    Rehab Potential  Good    PT Frequency  1x / week    PT Duration  8 weeks    PT  Treatment/Interventions  Biofeedback;Therapeutic exercise;Therapeutic activities;Functional mobility training;Neuromuscular re-education;Patient/family education;Dry needling;Manual techniques    PT Next Visit Plan  diaphragmatic breathing, abdominal massage, work on diaphragm, hip stretches, low sounds instead of high pitch to relax the pelvic floor    Recommended Other Services  MD signed initial eval    Consulted and Agree with Plan of Care  Patient       Patient will benefit from skilled therapeutic intervention in order to improve the following deficits and impairments:  Decreased coordination, Increased fascial restricitons, Pain, Decreased activity tolerance, Decreased strength  Visit Diagnosis: Unspecified lack of coordination  Muscle weakness (generalized)  Abnormal defecation     Problem List Patient Active Problem List   Diagnosis Date Noted  . Obstructed internal hernia   . Elevated TSH 07/29/2016  . NSTEMI (non-ST elevated myocardial infarction) ([place])   . 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 MENOPAUSAL SYNDROME 09/17/2009  . 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 Parkinson's disease ([place]) 11/21/2006  . Irritable bowel syndrome 11/21/2006    Earlie Counts, PT 04/02/19 1:25 PM    [facility] [address] [place], [place], [address] Phone: [phone]   Fax:  [phone]  Name: Lindsey Bryant MRN: [id number] Date of Birth: 07-Jun-1940

## 2019-04-03 ENCOUNTER — Ambulatory Visit: Payer: Medicare HMO | Admitting: Internal Medicine

## 2019-04-09 ENCOUNTER — Ambulatory Visit: Payer: Medicare HMO | Admitting: Physical Therapy

## 2019-04-09 ENCOUNTER — Encounter: Payer: Self-pay | Admitting: Physical Therapy

## 2019-04-09 ENCOUNTER — Other Ambulatory Visit: Payer: Self-pay

## 2019-04-09 DIAGNOSIS — R198 Other specified symptoms and signs involving the digestive system and abdomen: Secondary | ICD-10-CM | POA: Diagnosis not present

## 2019-04-09 DIAGNOSIS — M6281 Muscle weakness (generalized): Secondary | ICD-10-CM | POA: Diagnosis not present

## 2019-04-09 DIAGNOSIS — R279 Unspecified lack of coordination: Secondary | ICD-10-CM

## 2019-04-09 NOTE — Therapy (Addendum)
[facility] [address], [place], [place], [address] Phone: [phone]   Fax:  [phone]  Physical Therapy Treatment  Patient Details  Name: Lindsey Bryant MRN: [id number] Date of Birth: 1940-07-01 Referring Provider (PT): Dr. Silvano Rusk   Encounter Date: 04/09/2019  PT End of Session - 04/09/19 1448    Visit Number  3    Date for PT Re-Evaluation  05/21/19    Authorization Type  [employer] Medicare    PT Start Time  2409    PT Stop Time  1523    PT Time Calculation (min)  38 min    Activity Tolerance  Patient tolerated treatment well;No increased pain    Behavior During Therapy  WFL for tasks assessed/performed       Past Medical History:  Diagnosis Date  . Acute systolic heart failure ([place])   . Bradycardia    a. 02/2012  . Hyperglycemia   . IBS (irritable bowel syndrome)   . Melanoma ([place])    right arm  . NSTEMI (non-ST elevated myocardial infarction) ([place])   . 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 Parkinson's disease     Past Surgical History:  Procedure Laterality Date  . APPENDECTOMY  1952  . COLONOSCOPY    . EYE SURGERY  07/2017   Cataract Sx  . LEFT HEART CATH AND CORONARY ANGIOGRAPHY N/A 08/03/2016   Procedure: Left Heart Cath and Coronary Angiography;  Surgeon: Leonie Man, MD;  Location: [place] [facility];  Service: Cardiovascular;  Laterality: N/A;  . MELANOMA EXCISION Right 2000   right arm  . PACEMAKER PLACEMENT  05/2012  . PACEMAKER REVISION N/A 03/14/2012   Procedure: PACEMAKER REVISION;  Surgeon: Thompson Grayer, MD;  Location: [facility] CATH LAB;  Service: Cardiovascular;  Laterality: N/A;  . PERMANENT PACEMAKER INSERTION N/A 03/06/2012   Procedure: PERMANENT PACEMAKER INSERTION;  Surgeon: Deboraha Sprang, MD;  Location: [facility] CATH LAB;  Service: Cardiovascular;  Laterality: N/A;  . 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  Subjective Assessment - 04/09/19 1449    Subjective  After last visit I had a bowel movement the next day. I ache like I have to go to the bathroom. I have been doing the abdominal massage and practicing the breathing. I was straining the pelvic floor too much. Exercises help.    Patient Stated Goals  Be done with having a bowel movement by 9 AM to do other things during the day.    Currently in Pain?  Yes    Pain Score  2     Pain Location  Abdomen    Pain Orientation  Lower    Pain Descriptors / Indicators  Aching;Discomfort    Pain Type  Acute pain    Pain Onset  More than a month ago    Pain Frequency  Intermittent    Aggravating Factors   not sure    Pain Relieving Factors  yoga poses help, Exlax    Multiple Pain Sites  No                       [facility] Adult PT Treatment/Exercise - 04/09/19 0001      Self-Care   Self-Care  Other Self-Care Comments    Other Self-Care Comments   using a ball to massage the feet to relax the pelvic floor, using a low sound  to have a bowel movement to relax the pelvic floor      Therapeutic Activites    Therapeutic Activities  Other Therapeutic Activities    Other Therapeutic Activities  instruction in correct sitting posture to relax the pelvic floor with not slouching, sitting on ischial tuberosity, and not crossing her legs      Neuro Re-ed    Neuro Re-ed Details   diaphragmatic breathing  with tactile cues to expand the abdomen      Manual Therapy   Manual Therapy  Soft tissue mobilization;Myofascial release    Soft tissue mobilization  bilateral diaphragm; circular massage to the abdomen to improve peristalic effect to help the stool move through the intestines    Myofascial Release  release of the mesenteric root, release of the right upper quadrant, release of the lower right abdominal               PT Short Term Goals - 04/09/19 1533      PT SHORT TERM GOAL #1   Title  independent with initial HEP     Time  4    Period  Weeks    Status  On-going      PT SHORT TERM GOAL #2   Title  able to perfrom diaphragmatic breathing to elongate the pelvic floor for a bowel movement    Baseline  still needs tactile cues    Time  4    Period  Weeks    Status  On-going      PT SHORT TERM GOAL #3   Title  understand toileting technique to relax the pelvic floor, knees above hips and correct breath    Time  4    Period  Weeks    Status  On-going        PT Long Term Goals - 03/26/19 1529      PT LONG TERM GOAL #1   Title  independent with advanced HEP    Time  8    Period  Weeks    Status  New    Target Date  05/21/19      PT LONG TERM GOAL #2   Title  able to complete her bowel movement with straining decreased >/= 60% so she is able to fully empty    Time  8    Period  Weeks    Status  New    Target Date  05/21/19      PT LONG TERM GOAL #3   Title  able to complete her bowel movements prior to 9:00 AM so she is able to attend her painting class    Time  8    Period  Weeks    Status  New    Target Date  05/21/19      PT LONG TERM GOAL #4   Title  able to have a complete bowel movement on the days for swimming prior to 10:00AM so she is able to attend her water aerobics class    Time  8    Period  Weeks    Status  New    Target Date  05/21/19            Plan - 04/09/19 1447    Clinical Impression Statement  Patient understand ways to relax the pelvic floor by massaging her feet, talking in a low sound, and diaphragmatic breathing. Patient understands to not strain with bowel movement to decrease strain on the pelvic floor. Patient continues to  have several days without a bowel movement Patient will benefit from skilled therapy to improve coordination for a bowel movement and abdility to relax her pelvic floor.    Personal Factors and Comorbidities  Age;Comorbidity 1;Comorbidity 3+    Comorbidities  Parkinsons, Pacemaker, Melanoma; IBS    Examination-Activity Limitations   Toileting    Examination-Participation Restrictions  Community Activity;Interpersonal Relationship    Stability/Clinical Decision Making  Evolving/Moderate complexity    Rehab Potential  Good    PT Frequency  1x / week    PT Duration  8 weeks    PT Treatment/Interventions  Biofeedback;Therapeutic exercise;Therapeutic activities;Functional mobility training;Neuromuscular re-education;Patient/family education;Dry needling;Manual techniques    PT Next Visit Plan  diaphragmatic breathing, abdominal massage, work on diaphragm    Consulted and Agree with Plan of Care  Patient       Patient will benefit from skilled therapeutic intervention in order to improve the following deficits and impairments:  Decreased coordination, Increased fascial restricitons, Pain, Decreased activity tolerance, Decreased strength  Visit Diagnosis: Unspecified lack of coordination  Muscle weakness (generalized)  Abnormal defecation     Problem List Patient Active Problem List   Diagnosis Date Noted  . Obstructed internal hernia   . Elevated TSH 07/29/2016  . NSTEMI (non-ST elevated myocardial infarction) ([place])   . 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 MENOPAUSAL SYNDROME 09/17/2009  . 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 Parkinson's disease ([place]) 11/21/2006  . Irritable bowel syndrome 11/21/2006    Earlie Counts, PT 04/09/19 3:34 PM   [facility] [address], [place], [place], [address] Phone: [phone]   Fax:  [phone]  Name: Lindsey Bryant MRN: [id number] Date of Birth: Feb 14, 1941  PHYSICAL THERAPY DISCHARGE SUMMARY  Visits from Start of Care: 3  Current functional level related to goals / functional outcomes: Spoke to patient today. She wants to cancel all of her appointment and be discharged at this time. She is concerned about the rising cases of COVID. Patient will like to return  in the future when the Covid cases are under control.    Remaining deficits: See above.    Education / Equipment: HEP Plan: Patient agrees to discharge.  Patient goals were not met. Patient is being discharged due to the patient's request. Thank you for the referral. Earlie Counts, PT 04/30/19 9:46 AM   ?????

## 2019-04-16 ENCOUNTER — Encounter: Payer: Medicare HMO | Admitting: Physical Therapy

## 2019-04-17 ENCOUNTER — Telehealth (INDEPENDENT_AMBULATORY_CARE_PROVIDER_SITE_OTHER): Payer: Medicare HMO | Admitting: Internal Medicine

## 2019-04-17 ENCOUNTER — Other Ambulatory Visit: Payer: Self-pay

## 2019-04-17 ENCOUNTER — Encounter: Payer: Self-pay | Admitting: Internal Medicine

## 2019-04-17 VITALS — Ht 62.0 in | Wt 110.0 lb

## 2019-04-17 DIAGNOSIS — I428 Other cardiomyopathies: Secondary | ICD-10-CM | POA: Diagnosis not present

## 2019-04-17 DIAGNOSIS — Z950 Presence of cardiac pacemaker: Secondary | ICD-10-CM | POA: Diagnosis not present

## 2019-04-17 DIAGNOSIS — I951 Orthostatic hypotension: Secondary | ICD-10-CM | POA: Diagnosis not present

## 2019-04-17 DIAGNOSIS — I442 Atrioventricular block, complete: Secondary | ICD-10-CM

## 2019-04-17 MED ORDER — LISINOPRIL 5 MG PO TABS: 5.0000 mg | ORAL_TABLET | Freq: Every day | ORAL | 3 refills | Status: DC

## 2019-04-17 NOTE — Patient Instructions (Addendum)
Medication Instructions:  Your physician recommends that you continue on your current medications as directed. Please refer to the Current Medication list given to you today.  *If you need a refill on your cardiac medications before your next appointment, please call your pharmacy*  Lab Work: None ordered  If you have labs (blood work) drawn today and your tests are completely normal, you will receive your results only by: Marland Kitchen MyChart Message (if you have MyChart) OR . A paper copy in the mail If you have any lab test that is abnormal or we need to change your treatment, we will call you to review the results.  Testing/Procedures: None ordered  Follow-Up: Remote monitoring is used to monitor your Pacemaker from home. This monitoring reduces the number of office visits required to check your device to one time per year. It allows [place] to keep an eye on the functioning of your device to ensure it is working properly. You are scheduled for a device check from home on 05/02/19. You may send your transmission at any time that day. If you have a wireless device, the transmission will be sent automatically. After your physician reviews your transmission, you will receive a postcard with your next transmission date.  At [facility], you and your health needs are our priority.  As part of our continuing mission to provide you with exceptional heart care, we have created designated Provider Care Teams.  These Care Teams include your primary Cardiologist (physician) and Advanced Practice Providers (APPs -  Physician Assistants and Nurse Practitioners) who all work together to provide you with the care you need, when you need it.  Your next appointment:   12 month(s)  The format for your next appointment:   In Person  Provider:   You may see Jolyn Nap, MD or one of the following Advanced Practice Providers on your designated Care Team:    Chanetta Marshall, NP  Tommye Standard, PA-C  Legrand Como "Oda Kilts, [place]   Other Instructions

## 2019-04-17 NOTE — Progress Notes (Signed)
Electrophysiology TeleHealth Note   Due to national recommendations of social distancing due to COVID 19, an audio/video telehealth visit is felt to be most appropriate for this patient at this time.  See MyChart message from today for the patient's consent to telehealth for [facility].   Date:  04/17/2019   ID:  Lindsey Bryant, DOB 1940/07/08, MRN [id number]  Location: patient's home  Provider location: [address] [place]  Evaluation Performed: Follow-up visit  PCP:  [name], MD  Cardiologist:     Electrophysiologist:  SK   Chief Complaint:  [place]  History of Present Illness:    Lindsey Bryant is a 78 y.o. female who presents via audio/video conferencing for a telehealth visit today. The patient did not have access to video technology/had technical difficulties with video requiring transitioning to audio format only (telephone).  All issues noted in this document were discussed and addressed.  No physical exam could be performed with this format.      Since last being seen in our clinic for complete heart block previously implanted pacemaker and severe orthostatic hypotension in the context of Parkinson's disease, the patient reports much less lightheadedness.  She however found that the abdominal binder made more problematic her GI symptoms.  Constipation remains an issue.  She is working on this with GI    DATE TEST EF   5/14 Echo  55 %   3/18 Cath  Non Obstructive CAs  3/18 Echo 30-35%   9/18 Echo 45-50%    8/19 Echo  50-55%       Date Cr K  8/18 0.75 4.6   10/19 0.72 4.6     The patient denies symptoms of fevers, chills, cough, or new SOB worrisome for COVID 19.     Past Medical History:  Diagnosis Date  . Acute systolic heart failure ([place])   . Bradycardia    a. 02/2012  . Hyperglycemia   . IBS (irritable bowel syndrome)   . Melanoma ([place])    right arm  . NSTEMI (non-ST elevated myocardial infarction) ([place])   .  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 Parkinson's disease     Past Surgical History:  Procedure Laterality Date  . APPENDECTOMY  1952  . COLONOSCOPY    . EYE SURGERY  07/2017   Cataract Sx  . LEFT HEART CATH AND CORONARY ANGIOGRAPHY N/A 08/03/2016   Procedure: Left Heart Cath and Coronary Angiography;  Surgeon: Leonie Man, MD;  Location: [place] [facility];  Service: Cardiovascular;  Laterality: N/A;  . MELANOMA EXCISION Right 2000   right arm  . PACEMAKER PLACEMENT  05/2012  . PACEMAKER REVISION N/A 03/14/2012   Procedure: PACEMAKER REVISION;  Surgeon: Thompson Grayer, MD;  [facility] CATH LAB;  Service: Cardiovascular;  Laterality: N/A;  . PERMANENT PACEMAKER INSERTION N/A 03/06/2012   Procedure: PERMANENT PACEMAKER INSERTION;  Surgeon: Deboraha Sprang, MD;  Location: [facility] CATH LAB;  Service: Cardiovascular;  Laterality: N/A;  . 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 aspirin EC 81 MG tablet Take 81 mg by mouth at bedtime.    . carbidopa-levodopa (SINEMET IR) 25-100 MG per tablet Take 2 tablets by mouth 6 (six) times daily.     . Cholecalciferol (VITAMIN D3) 2000 units capsule Take 2,000 Units by mouth daily.    . clidinium-chlordiazePOXIDE (LIBRAX) 5-2.5 MG capsule TAKE  1 CAPSULE BY MOUTH THREE TIMES A DAY AS NEEDED 30 capsule 0  . COCONUT OIL PO Take 6 oz by mouth daily.    Marland Kitchen docusate sodium (COLACE) 100 MG capsule Take 100 mg by mouth daily as needed for mild constipation.    Marland Kitchen lisinopril (ZESTRIL) 5 MG tablet Take 1 tablet (5 mg total) by mouth daily. Please keep upcoming appt in October with Dr. Johnsie Cancel for future refills. Thank you 90 tablet 0  . Omega-3 1000 MG CAPS Take 1 capsule by mouth daily.    . Probiotic Product (PROBIOTIC PO) Take 1 capsule by mouth daily.    Marland Kitchen Propylene Glycol (SYSTANE COMPLETE OP) Apply 1 drop to eye daily.    . Wheat Dextrin (BENEFIBER) POWD Take 1 Scoop by mouth  every other day.     No current facility-administered medications for this visit.     Allergies:   Patient has no known allergies.   Social History:  The patient  reports that she has quit smoking. She has never used smokeless tobacco. She reports that she does not drink alcohol or use drugs.   Family History:  The patient's   family history includes Alcohol abuse in her father; Breast cancer in an other family member; Diabetes in her brother; Heart attack (age of onset: 41) in her mother; Hypothyroidism in her daughter and daughter.   ROS:  Please see the history of present illness.   All other systems are personally reviewed and negative.    Exam:    Vital Signs:  Ht 5\' 2"  (1.575 m)   Wt 110 lb (49.9 kg)   BMI 20.12 kg/m        Labs/Other Tests and Data Reviewed:    Recent Labs: No results found for requested labs within last 8760 hours.   Wt Readings from Last 3 Encounters:  04/17/19 110 lb (49.9 kg)  02/27/19 116 lb 6.4 oz (52.8 kg)  02/13/19 117 lb (53.1 kg)     Other studies personally reviewed: Additional studies/ records that were reviewed today include:   Last device remote is reviewed from [facility] PDF dated 9/20 which reveals normal device function,  arrhythmias - none    ASSESSMENT & PLAN:    Complete heart block  Narrow escape  NICM  Post prandial Fatigue/Parkinson's   Pacemaker Medtronic   Orthostatic Hypotension  He says she has lightheadedness but this is able to be mitigated by sitting up slowly.  The abdominal binder was ineffective because it aggravated her GI symptoms.  Perhaps thigh sleeves would be more helpful if her symptoms require.  She says her biggest issues right now are her mobility so we will not make any other interventions at this point  COVID 19 screen The patient denies symptoms of COVID 19 at this time.  The importance of social distancing was discussed today.  Follow-up:  .62 m Next remote: As Scheduled   Current  medicines are reviewed at length with the patient today.   The patient does not have concerns regarding her medicines.  The following changes were made today:  none  Labs/ tests ordered today include:   No orders of the defined types were placed in this encounter.   Future tests ( post COVID )     Patient Risk:  after full review of this patients clinical status, I feel that they are at moderate risk at this time.  Today, I have spent 4 minutes with the patient with telehealth technology discussing the  above.  Signed, Virl Axe, MD  04/17/2019 3:13 PM     [place] [address] [phone] (office) [phone] (fax)

## 2019-04-23 ENCOUNTER — Encounter: Payer: Medicare HMO | Admitting: Physical Therapy

## 2019-04-30 ENCOUNTER — Encounter: Payer: Medicare HMO | Admitting: Physical Therapy

## 2019-05-02 ENCOUNTER — Ambulatory Visit (INDEPENDENT_AMBULATORY_CARE_PROVIDER_SITE_OTHER): Payer: Medicare HMO | Admitting: *Deleted

## 2019-05-02 DIAGNOSIS — I442 Atrioventricular block, complete: Secondary | ICD-10-CM | POA: Diagnosis not present

## 2019-05-06 LAB — CUP PACEART REMOTE DEVICE CHECK
Battery Impedance: 693 Ohm
Battery Remaining Longevity: 73 mo
Battery Voltage: 2.78 V
Brady Statistic AP VP Percent: 19 %
Brady Statistic AP VS Percent: 0 %
Brady Statistic AS VP Percent: 80 %
Brady Statistic AS VS Percent: 0 %
Date Time Interrogation Session: 20201211170307
Implantable Lead Implant Date: 20131014
Implantable Lead Implant Date: 20131014
Implantable Lead Location: 753859
Implantable Lead Location: 753860
Implantable Lead Model: 5076
Implantable Lead Model: 5076
Implantable Pulse Generator Implant Date: 20131014
Lead Channel Impedance Value: 442 Ohm
Lead Channel Impedance Value: 671 Ohm
Lead Channel Pacing Threshold Amplitude: 0.375 V
Lead Channel Pacing Threshold Amplitude: 0.5 V
Lead Channel Pacing Threshold Pulse Width: 0.4 ms
Lead Channel Pacing Threshold Pulse Width: 0.4 ms
Lead Channel Setting Pacing Amplitude: 2 V
Lead Channel Setting Pacing Amplitude: 2.5 V
Lead Channel Setting Pacing Pulse Width: 0.4 ms
Lead Channel Setting Sensing Sensitivity: 4 mV

## 2019-05-07 ENCOUNTER — Encounter: Payer: Medicare HMO | Admitting: Physical Therapy

## 2019-05-22 ENCOUNTER — Encounter: Payer: Self-pay | Admitting: Internal Medicine

## 2019-05-22 ENCOUNTER — Ambulatory Visit (INDEPENDENT_AMBULATORY_CARE_PROVIDER_SITE_OTHER): Payer: Medicare HMO | Admitting: Internal Medicine

## 2019-05-22 VITALS — BP 124/50 | HR 76 | Temp 97.8°F | Ht 61.5 in | Wt 114.4 lb

## 2019-05-22 DIAGNOSIS — K581 Irritable bowel syndrome with constipation: Secondary | ICD-10-CM

## 2019-05-22 DIAGNOSIS — R198 Other specified symptoms and signs involving the digestive system and abdomen: Secondary | ICD-10-CM

## 2019-05-22 NOTE — Progress Notes (Signed)
Lindsey Bryant 78 y.o. 07-23-1940 [id number]  Assessment & Plan:   Encounter Diagnoses  Name Primary?  . Irritable bowel syndrome with constipation   . Malfunction of anal sphincter Yes  . Abnormal defecation     Irritable bowel syndrome Improved Dulcolax prn qhs  Cc;Burchette, Alinda Sierras, MD   Subjective:   Chief Complaint: f/u constipation  HPI 78 yo w/ IBS-C and Parkinson's  Better overall - has good AM BM most days but some days will skip a day or 2 and then when does go 'its a bad day" - multiple stools.  Did 3 sessions of pelvic PT that were helpful and decided to stop due to potential Covid risks - believes HEP working well overall.  Was able to see her daughters at Xmas  [facility] [place] and looking forward to that "I need the exercise"   No Known Allergies Current Meds  Medication Sig  . aspirin EC 81 MG tablet Take 81 mg by mouth at bedtime.  . carbidopa-levodopa (SINEMET IR) 25-100 MG per tablet Take 2 tablets by mouth 6 (six) times daily.   . Cholecalciferol (VITAMIN D3) 2000 units capsule Take 2,000 Units by mouth daily.  . clidinium-chlordiazePOXIDE (LIBRAX) 5-2.5 MG capsule TAKE 1 CAPSULE BY MOUTH THREE TIMES A DAY AS NEEDED  . COCONUT OIL PO Take 6 oz by mouth daily.  Marland Kitchen docusate sodium (COLACE) 100 MG capsule Take 100 mg by mouth daily as needed for mild constipation.  Marland Kitchen lisinopril (ZESTRIL) 5 MG tablet Take 1 tablet (5 mg total) by mouth daily.  . Omega-3 1000 MG CAPS Take 1 capsule by mouth daily.  . polyethylene glycol powder (GLYCOLAX/MIRALAX) 17 GM/SCOOP powder Take 8.5 g by mouth. 5 times a week  . Probiotic Product (PROBIOTIC PO) Take 1 capsule by mouth daily.  Marland Kitchen Propylene Glycol (SYSTANE COMPLETE OP) Apply 1 drop to eye daily.   Past Medical History:  Diagnosis Date  . Acute systolic heart failure ([place])   . Bradycardia    a. 02/2012  . Hyperglycemia   . IBS (irritable bowel syndrome)   . Melanoma ([place])    right  arm  . NSTEMI (non-ST elevated myocardial infarction) ([place])   . 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 Parkinson's disease    Past Surgical History:  Procedure Laterality Date  . APPENDECTOMY  1952  . COLONOSCOPY    . EYE SURGERY  07/2017   Cataract Sx  . LEFT HEART CATH AND CORONARY ANGIOGRAPHY N/A 08/03/2016   Procedure: Left Heart Cath and Coronary Angiography;  Surgeon: Leonie Man, MD;  Location: [place] [facility];  Service: Cardiovascular;  Laterality: N/A;  . MELANOMA EXCISION Right 2000   right arm  . PACEMAKER PLACEMENT  05/2012  . PACEMAKER REVISION N/A 03/14/2012   Procedure: PACEMAKER REVISION;  Surgeon: Thompson Grayer, MD;  Location: [facility] CATH LAB;  Service: Cardiovascular;  Laterality: N/A;  . PERMANENT PACEMAKER INSERTION N/A 03/06/2012   Procedure: PERMANENT PACEMAKER INSERTION;  Surgeon: Deboraha Sprang, MD;  Location: [facility] CATH LAB;  Service: Cardiovascular;  Laterality: N/A;  . 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 2 daughters I think.   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   family history includes Alcohol abuse in her father; Breast cancer in an other family member; Diabetes in her brother; Heart attack (age of  onset: 15) in her mother; Hypothyroidism in her daughter and daughter.   Review of Systems As above  Objective:   Physical Exam BP (!) 124/50 (BP Location: Left Arm, Patient Position: Sitting, Cuff Size: Normal)   Pulse 76   Temp 97.8 F (36.6 C)   Ht 5' 1.5" (1.562 m) Comment: height measured without shoes  Wt 114 lb 6 oz (51.9 kg)   BMI 21.26 kg/m  Petite elderly ww NAD

## 2019-05-22 NOTE — Patient Instructions (Signed)
On days that you do not have a bowel movement take one Dulcolax.   Follow up with Dr [name] as needed.    I appreciate the opportunity to care for you. Silvano Rusk, MD, [facility]

## 2019-05-22 NOTE — Assessment & Plan Note (Addendum)
Improved Dulcolax prn qhs F/u prn Hopefully water aerobics will help overall

## 2019-06-23 ENCOUNTER — Ambulatory Visit: Payer: Medicare HMO

## 2019-06-28 ENCOUNTER — Ambulatory Visit: Payer: Medicare HMO

## 2019-07-02 ENCOUNTER — Other Ambulatory Visit: Payer: Self-pay | Admitting: Family Medicine

## 2019-07-30 DIAGNOSIS — K5900 Constipation, unspecified: Secondary | ICD-10-CM | POA: Diagnosis not present

## 2019-07-30 DIAGNOSIS — Z833 Family history of diabetes mellitus: Secondary | ICD-10-CM | POA: Diagnosis not present

## 2019-07-30 DIAGNOSIS — Z7982 Long term (current) use of aspirin: Secondary | ICD-10-CM | POA: Diagnosis not present

## 2019-07-30 DIAGNOSIS — Z87891 Personal history of nicotine dependence: Secondary | ICD-10-CM | POA: Diagnosis not present

## 2019-07-30 DIAGNOSIS — Z803 Family history of malignant neoplasm of breast: Secondary | ICD-10-CM | POA: Diagnosis not present

## 2019-07-30 DIAGNOSIS — Z008 Encounter for other general examination: Secondary | ICD-10-CM | POA: Diagnosis not present

## 2019-07-30 DIAGNOSIS — R69 Illness, unspecified: Secondary | ICD-10-CM | POA: Diagnosis not present

## 2019-07-30 DIAGNOSIS — I10 Essential (primary) hypertension: Secondary | ICD-10-CM | POA: Diagnosis not present

## 2019-07-30 DIAGNOSIS — G20 Parkinson's disease: Secondary | ICD-10-CM | POA: Diagnosis not present

## 2019-07-30 DIAGNOSIS — Z85820 Personal history of malignant melanoma of skin: Secondary | ICD-10-CM | POA: Diagnosis not present

## 2019-08-01 ENCOUNTER — Ambulatory Visit (INDEPENDENT_AMBULATORY_CARE_PROVIDER_SITE_OTHER): Payer: Medicare HMO | Admitting: *Deleted

## 2019-08-01 DIAGNOSIS — I442 Atrioventricular block, complete: Secondary | ICD-10-CM

## 2019-08-03 LAB — CUP PACEART REMOTE DEVICE CHECK
Battery Impedance: 717 Ohm
Battery Remaining Longevity: 71 mo
Battery Voltage: 2.78 V
Brady Statistic AP VP Percent: 17 %
Brady Statistic AP VS Percent: 0 %
Brady Statistic AS VP Percent: 83 %
Brady Statistic AS VS Percent: 0 %
Date Time Interrogation Session: 20210311202347
Implantable Lead Implant Date: 20131014
Implantable Lead Implant Date: 20131014
Implantable Lead Location: 753859
Implantable Lead Location: 753860
Implantable Lead Model: 5076
Implantable Lead Model: 5076
Implantable Pulse Generator Implant Date: 20131014
Lead Channel Impedance Value: 438 Ohm
Lead Channel Impedance Value: 627 Ohm
Lead Channel Pacing Threshold Amplitude: 0.375 V
Lead Channel Pacing Threshold Amplitude: 0.5 V
Lead Channel Pacing Threshold Pulse Width: 0.4 ms
Lead Channel Pacing Threshold Pulse Width: 0.4 ms
Lead Channel Setting Pacing Amplitude: 2 V
Lead Channel Setting Pacing Amplitude: 2.5 V
Lead Channel Setting Pacing Pulse Width: 0.4 ms
Lead Channel Setting Sensing Sensitivity: 4 mV

## 2019-08-03 NOTE — Progress Notes (Signed)
PPM Remote  

## 2019-08-15 ENCOUNTER — Other Ambulatory Visit: Payer: Self-pay | Admitting: Family Medicine

## 2019-08-15 ENCOUNTER — Other Ambulatory Visit: Payer: Self-pay

## 2019-08-15 MED ORDER — CILIDINIUM-CHLORDIAZEPOXIDE 2.5-5 MG PO CAPS: ORAL_CAPSULE | ORAL | 2 refills | Status: DC

## 2019-08-15 MED ORDER — CILIDINIUM-CHLORDIAZEPOXIDE 2.5-5 MG PO CAPS: ORAL_CAPSULE | ORAL | 0 refills | Status: DC

## 2019-08-15 NOTE — Telephone Encounter (Signed)
Last ov:01/19/2019 Last filled:07/02/19

## 2019-08-21 ENCOUNTER — Telehealth: Payer: Self-pay | Admitting: Family Medicine

## 2019-08-21 NOTE — Telephone Encounter (Signed)
D/C Librax.  Start Dicyclomine 10 mg po q 6 hours prn abdominal spasms #30

## 2019-08-21 NOTE — Telephone Encounter (Signed)
Please advise 

## 2019-08-21 NOTE — Telephone Encounter (Signed)
Pt called the pharmacy and was told that the Clidinium-Chlordiazepoxide medication is no longer being made according to the manufacture. She is needing an alternative called in. She would like it by Friday since she is going out of town.   Pharmacy: [facility]: [phone]    Pt can be reached at [phone]

## 2019-08-22 MED ORDER — DICYCLOMINE HCL 10 MG PO CAPS: 10.0000 mg | ORAL_CAPSULE | Freq: Four times a day (QID) | ORAL | 0 refills | Status: DC | PRN

## 2019-08-22 NOTE — Telephone Encounter (Signed)
Medication has been sent In

## 2019-09-13 ENCOUNTER — Telehealth: Payer: Self-pay | Admitting: Family Medicine

## 2019-09-13 NOTE — Telephone Encounter (Signed)
Pt states the new prescription dicyclomine 10 mg does not work very well. Pt is wondering if something different can be sent or maybe a different dosage?   Pharmacy: [facility] FAX: [phone]   Pt can be reached at [phone] -ok to leave a detailed message per pt

## 2019-09-14 NOTE — Telephone Encounter (Signed)
I would advise she discuss with GI since they are following her.  She could try increasing the dicyclomine to 20 mg per dose over the weekend.

## 2019-09-14 NOTE — Telephone Encounter (Signed)
Please advise 

## 2019-09-14 NOTE — Telephone Encounter (Signed)
Pt has been given information and nothing else needed

## 2019-10-03 ENCOUNTER — Telehealth: Payer: Self-pay | Admitting: Internal Medicine

## 2019-10-03 NOTE — Telephone Encounter (Signed)
Pt stated that she has been taking generic Librax prescribed by Dr. Elease Hashimoto for over 20 years.  She informed that drug is no longer being manufactured and inquired whether you can recommend an alternative drug.

## 2019-10-05 MED ORDER — DICYCLOMINE HCL 20 MG PO TABS: 20.0000 mg | ORAL_TABLET | Freq: Four times a day (QID) | ORAL | 0 refills | Status: DC | PRN

## 2019-10-05 MED ORDER — DIAZEPAM 5 MG PO TABS: 5.0000 mg | ORAL_TABLET | Freq: Four times a day (QID) | ORAL | 1 refills | Status: DC | PRN

## 2019-10-05 NOTE — Telephone Encounter (Signed)
Spoke to patient  Using dicyclomine but 10 mg not helping and 20 mg probably not  Uses Libarax 3 x a month since her 20's when stressed or anxious which triggers IBS sxs  Will try diazepam + dicyclomine  Rxs sent

## 2019-10-13 ENCOUNTER — Other Ambulatory Visit: Payer: Self-pay | Admitting: Internal Medicine

## 2019-10-19 DIAGNOSIS — G20 Parkinson's disease: Secondary | ICD-10-CM | POA: Diagnosis not present

## 2019-10-19 DIAGNOSIS — Z79899 Other long term (current) drug therapy: Secondary | ICD-10-CM | POA: Diagnosis not present

## 2019-10-24 ENCOUNTER — Other Ambulatory Visit: Payer: Self-pay

## 2019-10-24 MED ORDER — DICYCLOMINE HCL 10 MG PO CAPS
ORAL_CAPSULE | ORAL | 2 refills | Status: DC
Start: 2019-10-24 — End: 2019-11-02

## 2019-10-26 ENCOUNTER — Other Ambulatory Visit: Payer: Self-pay | Admitting: Internal Medicine

## 2019-10-29 ENCOUNTER — Telehealth: Payer: Self-pay | Admitting: Family Medicine

## 2019-10-29 NOTE — Progress Notes (Signed)
  Chronic Care Management   Outreach Note  10/29/2019 Name: Lindsey Bryant MRN: [id number] DOB: March 19, 1941  Referred by: [name], MD Reason for referral : No chief complaint on file.   An unsuccessful telephone outreach was attempted today. The patient was referred to the pharmacist for assistance with care management and care coordination.   Follow Up Plan:   Prathima Ghanta [employer] Scheduler

## 2019-10-29 NOTE — Telephone Encounter (Signed)
Patient messaged back and said the 20mg  were too small to break.

## 2019-10-29 NOTE — Telephone Encounter (Signed)
Sheri sent in the 10mg  today. Are we refilling the 20mg  as well Sir? And if so is 90 days okay?

## 2019-10-29 NOTE — Telephone Encounter (Signed)
10 mg is fine  I had asked her if she wanted to try 1/2 of 20 mg  Not sure I ever heard back

## 2019-10-30 ENCOUNTER — Telehealth: Payer: Self-pay | Admitting: Family Medicine

## 2019-10-30 NOTE — Chronic Care Management (AMB) (Signed)
  Chronic Care Management   Note  10/30/2019 Name: Lindsey Bryant MRN: [id number] DOB: [birth year]  Lindsey Bryant is a 79 y.o. year old female who is a primary care patient of Burchette, Alinda Sierras, MD. I reached out to Eber Hong by phone today in response to a referral sent by Ms. Janey Greaser PCP, [name], MD.   [name] was given information about Chronic Care Management services today including:  1. CCM service includes personalized support from designated clinical staff supervised by her physician, including individualized plan of care and coordination with other care providers 2. 24/7 contact phone numbers for assistance for urgent and routine care needs. 3. Service will only be billed when office clinical staff spend 20 minutes or more in a month to coordinate care. 4. Only one practitioner may furnish and bill the service in a calendar month. 5. The patient may stop CCM services at any time (effective at the end of the month) by phone call to the office staff.   Patient agreed to services and verbal consent obtained.   Follow up plan:   [place]

## 2019-10-31 ENCOUNTER — Ambulatory Visit (INDEPENDENT_AMBULATORY_CARE_PROVIDER_SITE_OTHER): Payer: Medicare HMO | Admitting: *Deleted

## 2019-10-31 DIAGNOSIS — I442 Atrioventricular block, complete: Secondary | ICD-10-CM

## 2019-11-01 ENCOUNTER — Telehealth: Payer: Self-pay

## 2019-11-01 NOTE — Telephone Encounter (Signed)
Spoke with patient to remind of missed remote transmission 

## 2019-11-02 ENCOUNTER — Other Ambulatory Visit: Payer: Self-pay | Admitting: Internal Medicine

## 2019-11-05 LAB — CUP PACEART REMOTE DEVICE CHECK
Battery Impedance: 819 Ohm
Battery Remaining Longevity: 67 mo
Battery Voltage: 2.78 V
Brady Statistic AP VP Percent: 17 %
Brady Statistic AP VS Percent: 0 %
Brady Statistic AS VP Percent: 83 %
Brady Statistic AS VS Percent: 0 %
Date Time Interrogation Session: 20210611131302
Implantable Lead Implant Date: 20131014
Implantable Lead Implant Date: 20131014
Implantable Lead Location: 753859
Implantable Lead Location: 753860
Implantable Lead Model: 5076
Implantable Lead Model: 5076
Implantable Pulse Generator Implant Date: 20131014
Lead Channel Impedance Value: 426 Ohm
Lead Channel Impedance Value: 682 Ohm
Lead Channel Pacing Threshold Amplitude: 0.375 V
Lead Channel Pacing Threshold Amplitude: 0.5 V
Lead Channel Pacing Threshold Pulse Width: 0.4 ms
Lead Channel Pacing Threshold Pulse Width: 0.4 ms
Lead Channel Setting Pacing Amplitude: 2 V
Lead Channel Setting Pacing Amplitude: 2.5 V
Lead Channel Setting Pacing Pulse Width: 0.4 ms
Lead Channel Setting Sensing Sensitivity: 4 mV

## 2019-11-05 NOTE — Progress Notes (Signed)
Remote pacemaker transmission.   

## 2019-11-07 ENCOUNTER — Telehealth: Payer: Self-pay | Admitting: Family Medicine

## 2019-11-07 NOTE — Progress Notes (Signed)
°  Chronic Care Management   Outreach Note  11/07/2019 Name: Lindsey Bryant MRN: [id number] DOB: [birth year]  Referred by: [name], MD Reason for referral : No chief complaint on file.   A second unsuccessful telephone outreach was attempted today. The patient was referred to pharmacist for assistance with care management and care coordination.  Follow Up Plan:   [place]

## 2019-11-13 ENCOUNTER — Other Ambulatory Visit: Payer: Self-pay | Admitting: Internal Medicine

## 2019-12-10 NOTE — Chronic Care Management (AMB) (Signed)
Chronic Care Management Pharmacy  Name: Lindsey Bryant  MRN: [id number] DOB: 1941/05/10  Initial Questions: 1. Have you seen any other providers since your last visit? N/A 2. Any changes in your medicines or health? No   Chief Complaint/ HPI  Lindsey Bryant,  79 y.o. , female presents for their Initial CCM visit with the clinical pharmacist In office.  Patient reports having IBS symptoms for a long time and is managed on dicyclomine 10mg , 1 tablet every six hours as needed (reports needs to take 1 to 3 times per month), MIralax, 8.5 five times a week, and probiotic, 1 capsule once daily (patient notes no difference in gas/ constipation with probiotic). She reports watching types of foods that would would eat. Patient would like to consider seeing neurologist closer to home and states this was discussed with Dr. Elease Hashimoto in the past.  Patient also reports concerns with weight loss.   PCP : [name], MD  Their chronic conditions include: IBS- C, NSTEMI, NICM, hx of acute systolic heart failure, parkinson's disease  Office Visits: 01/19/2019- [name], MD- Patient presented for office visit for follow up. Patient to trial fiber supplementation and to pick up exercise with walking. Patient opted to think over local neurology groups before transitioning from [facility].   Consult Visit: 10/19/2019- Neurology- William Dalton, MD- Patient presented for office visit for parkinson's disease. Patient to continue current dose of carbidopa/ levodopa and regular exercise. Patient to follow up with Elbert Ewings in 6 months.   08/01/2019- Cardiology- Virl Axe- unable to access notes.   05/22/2019- Gastroenterology- Silvano Rusk, MD- Patient presented for office visit for IBS-C. Patient reports improvement. Patient to use Dulcolax PRN qHS and follow up as needed.   05/02/2019- Cardiology- Virl Axe- unable to access notes.  04/17/2019- Cardiology- Virl Axe, MD-  Patient presented for telehealth visit. Abdominal binder ineffective. Consider thigh sleeves. No major interventions done since patient's biggest issues are her mobility. Patient to return in 12 months.   Medications: Outpatient Encounter Medications as of 12/11/2019  Medication Sig  . aspirin EC 81 MG tablet Take 81 mg by mouth at bedtime.  . Calcium-Phosphorus-Vitamin D (CITRACAL CALCIUM GUMMIES PO) Take by mouth as directed.  . carbidopa-levodopa (SINEMET IR) 25-100 MG per tablet Take 2 tablets by mouth 6 (six) times daily.   . Cholecalciferol (VITAMIN D3) 2000 units capsule Take 2,000 Units by mouth daily.  Marland Kitchen dicyclomine (BENTYL) 10 MG capsule TAKE 10 MG EVERY 6 HOURS AS NEEDED  . lisinopril (ZESTRIL) 5 MG tablet Take 1 tablet (5 mg total) by mouth daily.  . melatonin 5 MG TABS Take 5 mg by mouth at bedtime.  . Omega-3 1000 MG CAPS Take 1 capsule by mouth daily.  . polyethylene glycol powder (GLYCOLAX/MIRALAX) 17 GM/SCOOP powder Take 8.5 g by mouth. 5 times a week  . Probiotic Product (PROBIOTIC PO) Take 1 capsule by mouth daily.  Marland Kitchen Propylene Glycol (SYSTANE COMPLETE OP) Apply 1 drop to eye daily.  . Simethicone (GAS-X PO) Take 1 tablet by mouth. After meals and at bedtime  . VITAMIN E PO Take 1 capsule by mouth daily.  . COCONUT OIL PO Take 6 oz by mouth daily. (Patient not taking: Reported on 12/11/2019)  . diazepam (VALIUM) 5 MG tablet Take 1 tablet (5 mg total) by mouth every 6 (six) hours as needed for anxiety or muscle spasms (IBS - take with dicyclomine). (Patient not taking: Reported on 12/18/2019)  . docusate sodium (COLACE)  100 MG capsule Take 100 mg by mouth daily as needed for mild constipation. (Patient not taking: Reported on 12/11/2019)   No facility-administered encounter medications on file as of 12/11/2019.     Current Diagnosis/Assessment:  Goals Addressed            This Visit's Progress   . Pharmacy Care Plan       CARE PLAN ENTRY (see longitudinal plan of care  for additional care plan information)  Current Barriers:  . Chronic Disease Management support, education, and care coordination needs related to  IBS, NSTEMI, Parkinson's disease  IBS- C . Pharmacist Clinical Goal(s) o Over the next 90 days, patient will work with PharmD and providers to maintain normal bowel movements.  . Current regimen:  . Dicyclomine 10mg , 1 tablet every six hours as needed . Dulcolax  . MIralax, 8.5 five times a week . Probiotic (Just Thrive), 1 capsule once daily . Interventions: o Recommend discontinuing current probiotic (Just Thrive) due to no difference seen in symptoms. If want to try another probiotic, consider probiotic with multiple strains of probiotic.  Marland Kitchen Patient self care activities - Over the next 90 days, patient will: o Continue current medications as directed by providers.   History of NSTEMI . Pharmacist Clinical Goal(s) o Over the next 90 days, patient will work with PharmD and providers to prevent cardiac events.  . Current regimen:  o Lisinopril 5mg , 1 tablet once daily o Aspirin 81mg , 1 tablet once daily   . Interventions:  . Recommend monitoring blood pressure.  . Patient self care activities o Patient will continue current medications as directed.   Parkinson's disease . Pharmacist Clinical Goal(s) o Over the next 90 days, patient will work with PharmD and providers to minimize symptoms associated with parkinson's.  . Current regimen:  o Carbidopa- levodopa (Sinemet IR) 25-100mg , 2 tablets six times daily  . Interventions: o Discussed side effects of carbidopa-levodopa.  . Patient self care activities o Patient will continue current medications as directed.   Medication management . Pharmacist Clinical Goal(s): o Over the next 30 days, patient will work with PharmD and providers to maintain optimal medication adherence . Current pharmacy: [facility] . Interventions o Comprehensive medication review performed. o Continue current  medication management strategy . Patient self care activities - Over the next 30 days, patient will: o Take medications as prescribed o Report any questions or concerns to PharmD and/or provider(s)  Initial goal documentation        SDOH Interventions     Most Recent Value  SDOH Interventions  Financial Strain Interventions Intervention Not Indicated  Transportation Interventions Intervention Not Indicated      IBS-C   Patient has failed these meds in past: Diazepam (dizziness)   Patient is currently controlled on the following medications:   . Dicyclomine 10mg , 1 tablet every six hours as needed . Dulcolax  . MIralax, 8.5 five times a week . Probiotic (Just Thrive), 1 capsule once daily   We discussed:  - different strains in probiotics  - unclear evidence of probiotic helping IBS symptoms - Just Thrive- contains Bacillus indicus, Bacillus coagulans, Bacillus clausii, Bacillus subtilis  Plan Continue current medications  Recommend discontinue probiotic since patient reports no difference in symptoms and no clear benefits. Could consider probiotic with greater variety of different strains.   NICM/ hx of acute systolic heart failure   Ejection fraction:  50% - 55% (01/17/2018)  45%- 50% (02/01/2017) 35% -  40% (07/30/2016)  Office blood  pressures are  BP Readings from Last 3 Encounters:  05/22/19 (!) 124/50  02/27/19 102/60  02/13/19 122/62   Patient has failed these meds in the past: none  Patient checks BP at home does not monitor   Patient home BP readings are ranging: N/A  Patient is controlled on:   Lisinopril 5mg , 1 tablet once daily   We discussed monitoring BP at home.  Plan Continue current medications  Managed by Dr. Caryl Comes (cardiology)     Parkinson's    Patient is currently controlled on the following medications:  . Carbidopa- levodopa (Sinemet IR) 25-100mg , 2 tablets six times daily   Plan Managed by neurology [facility]) Continue  current medications  Consulting with Dr. Elease Hashimoto on neurologist recommendations.    Hx of NSTEMI    Patient is currently  on the following medications:  . Aspirin 81mg , 1 tablet once daily   Plan Continue current medications    OTC/ Supplements   Patient is currentlyon the following medications:  . Omega-3 1000mg  . cholecalciferol (vitamin D3) . Vitamin E . Calcium (Citracal chewables)  . Melatonin 5mg , qHS   Plan Patient to verify doses of supplements she is taking and confirm at follow up.   Vaccines   Reviewed and discussed patient's vaccination history.    Immunization History  Administered Date(s) Administered  . Influenza Split 03/03/2012  . Influenza Whole 03/24/2007, 02/26/2008, 03/13/2010  . Influenza, High Dose Seasonal PF 03/09/2013, 02/17/2015, 01/29/2017, 02/08/2018, 02/19/2019  . Influenza, Seasonal, Injecte, Preservative Fre 03/10/2014  . Influenza-Unspecified 02/24/2016  . Moderna SARS-COVID-2 Vaccination 06/23/2019, 07/21/2019  . Pneumococcal Conjugate-13 07/17/2014  . Pneumococcal Polysaccharide-23 05/24/2006  . Tdap 06/04/2011  . Zoster 02/26/2008  . Zoster Recombinat (Shingrix) 04/06/2018, 06/15/2018, 07/21/2018    Plan No recommendations. Patient up-to-date.    Medication Management  Patient organizes medications:  - patient has own strategy. She uses piece of paper and will mark off when she has taken a dose of levodopa/carbidopa [facility] Adherence:  - carbidopa/ levodopa 25-100mg  (last filled 08/20/19 for 90DS)    Follow up Follow up visit with PharmD in 2 weeks.  Patient to schedule physical with Dr. Elease Hashimoto for routine blood work and to discuss concerns with weightloss.    Anson Crofts, PharmD Clinical Pharmacist [facility] [place] [phone]

## 2019-12-11 ENCOUNTER — Other Ambulatory Visit: Payer: Self-pay

## 2019-12-11 ENCOUNTER — Ambulatory Visit: Payer: Medicare HMO

## 2019-12-11 DIAGNOSIS — K581 Irritable bowel syndrome with constipation: Secondary | ICD-10-CM

## 2019-12-11 DIAGNOSIS — G20 Parkinson's disease: Secondary | ICD-10-CM

## 2019-12-11 DIAGNOSIS — I214 Non-ST elevation (NSTEMI) myocardial infarction: Secondary | ICD-10-CM

## 2019-12-11 NOTE — Telephone Encounter (Signed)
Referral placed.

## 2019-12-18 NOTE — Patient Instructions (Addendum)
Visit Information  Goals Addressed            This Visit's Progress   . Pharmacy Care Plan       CARE PLAN ENTRY (see longitudinal plan of care for additional care plan information)  Current Barriers:  . Chronic Disease Management support, education, and care coordination needs related to  IBS, NSTEMI, Parkinson's disease  IBS- C . Pharmacist Clinical Goal(s) o Over the next 90 days, patient will work with PharmD and providers to maintain normal bowel movements.  . Current regimen:  . Dicyclomine 10mg , 1 tablet every six hours as needed . Dulcolax  . MIralax, 8.5 five times a week . Probiotic (Just Thrive), 1 capsule once daily . Interventions: o Recommend discontinuing current probiotic (Just Thrive) due to no difference seen in symptoms. If want to try another probiotic, consider probiotic with multiple strains of probiotic.  Marland Kitchen Patient self care activities - Over the next 90 days, patient will: o Continue current medications as directed by providers.   History of NSTEMI . Pharmacist Clinical Goal(s) o Over the next 90 days, patient will work with PharmD and providers to prevent cardiac events.  . Current regimen:  o Lisinopril 5mg , 1 tablet once daily o Aspirin 81mg , 1 tablet once daily   . Interventions:  . Recommend monitoring blood pressure.  . Patient self care activities o Patient will continue current medications as directed.   Parkinson's disease . Pharmacist Clinical Goal(s) o Over the next 90 days, patient will work with PharmD and providers to minimize symptoms associated with parkinson's.  . Current regimen:  o Carbidopa- levodopa (Sinemet IR) 25-100mg , 2 tablets six times daily  . Interventions: o Discussed side effects of carbidopa-levodopa.  . Patient self care activities o Patient will continue current medications as directed.   Medication management . Pharmacist Clinical Goal(s): o Over the next 30 days, patient will work with PharmD and providers  to maintain optimal medication adherence . [facility] . Interventions o Comprehensive medication review performed. o Continue current medication management strategy . Patient self care activities - Over the next 30 days, patient will: o Take medications as prescribed o Report any questions or concerns to PharmD and/or provider(s)  Initial goal documentation        Ms. Bundrick was given information about Chronic Care Management services today including:  1. CCM service includes personalized support from designated clinical staff supervised by her physician, including individualized plan of care and coordination with other care providers 2. 24/7 contact phone numbers for assistance for urgent and routine care needs. 3. Standard insurance, coinsurance, copays and deductibles apply for chronic care management only during months in which we provide at least 20 minutes of these services. Most insurances cover these services at 100%, however patients may be responsible for any copay, coinsurance and/or deductible if applicable. This service may help you avoid the need for more expensive face-to-face services. 4. Only one practitioner may furnish and bill the service in a calendar month. 5. The patient may stop CCM services at any time (effective at the end of the month) by phone call to the office staff.  Patient agreed to services and verbal consent obtained.   The patient verbalized understanding of instructions provided today and agreed to receive a mailed copy of patient instruction and/or educational materials. Telephone follow up appointment with pharmacy team member scheduled for: 12/25/2019  Anson Crofts, PharmD Clinical Pharmacist [facility] [place] [phone]   Diet  for Irritable Bowel Syndrome When you have irritable bowel syndrome (IBS), it is very important to eat the foods and follow the eating habits that are best for your condition. IBS  may cause various symptoms such as pain in the abdomen, constipation, or diarrhea. Choosing the right foods can help to ease the discomfort from these symptoms. Work with your health care provider and diet and nutrition specialist (dietitian) to find the eating plan that will help to control your symptoms. What are tips for following this plan?      Keep a food diary. This will help you identify foods that cause symptoms. Write down: ? What you eat and when you eat it. ? What symptoms you have. ? When symptoms occur in relation to your meals, such as "pain in abdomen 2 hours after dinner."  Eat your meals slowly and in a relaxed setting.  Aim to eat 5-6 small meals per day. Do not skip meals.  Drink enough fluid to keep your urine pale yellow.  Ask your health care provider if you should take an over-the-counter probiotic to help restore healthy bacteria in your gut (digestive tract). ? Probiotics are foods that contain good bacteria and yeasts.  Your dietitian may have specific dietary recommendations for you based on your symptoms. He or she may recommend that you: ? Avoid foods that cause symptoms. Talk with your dietitian about other ways to get the same nutrients that are in those problem foods. ? Avoid foods with gluten. Gluten is a protein that is found in rye, wheat, and barley. ? Eat more foods that contain soluble fiber. Examples of foods with high soluble fiber include oats, seeds, and certain fruits and vegetables. Take a fiber supplement if directed by your dietitian. ? Reduce or avoid certain foods called FODMAPs. These are foods that contain carbohydrates that are hard to digest. Ask your doctor which foods contain these carbohydrates. What foods are not recommended? The following are some foods and drinks that may make your symptoms worse:  Fatty foods, such as french fries.  Foods that contain gluten, such as pasta and cereal.  Dairy products, such as milk, cheese,  and ice cream.  Chocolate.  Alcohol.  Products with caffeine, such as coffee.  Carbonated drinks, such as soda.  Foods that are high in FODMAPs. These include certain fruits and vegetables.  Products with sweeteners such as honey, high fructose corn syrup, sorbitol, and mannitol. The items listed above may not be a complete list of foods and beverages you should avoid. Contact a dietitian for more information. What foods are good sources of fiber? Your health care provider or dietitian may recommend that you eat more foods that contain fiber. Fiber can help to reduce constipation and other IBS symptoms. Add foods with fiber to your diet a little at a time so your body can get used to them. Too much fiber at one time might cause gas and swelling of your abdomen. The following are some foods that are good sources of fiber:  Berries, such as raspberries, strawberries, and blueberries.  Tomatoes.  Carrots.  Brown rice.  Oats.  Seeds, such as chia and pumpkin seeds. The items listed above may not be a complete list of recommended sources of fiber. Contact your dietitian for more options. Where to find more information  International Foundation for Functional Gastrointestinal Disorders: www.iffgd.[employer] of Diabetes and Digestive and Kidney Diseases: [employer] Summary  When you have irritable bowel syndrome (IBS), it is  very important to eat the foods and follow the eating habits that are best for your condition.  IBS may cause various symptoms such as pain in the abdomen, constipation, or diarrhea.  Choosing the right foods can help to ease the discomfort that comes from symptoms.  Keep a food diary. This will help you identify foods that cause symptoms.  Your health care provider or diet and nutrition specialist (dietitian) may recommend that you eat more foods that contain fiber. This information is not intended to replace advice given to you by your  health care provider. Make sure you discuss any questions you have with your health care provider. Document Revised: 08/30/2018 Document Reviewed: 01/11/2017 Elsevier Patient Education  [place].

## 2019-12-25 ENCOUNTER — Other Ambulatory Visit: Payer: Self-pay

## 2019-12-25 ENCOUNTER — Telehealth: Payer: Medicare HMO

## 2019-12-25 ENCOUNTER — Ambulatory Visit: Payer: Medicare HMO

## 2019-12-25 DIAGNOSIS — G20 Parkinson's disease: Secondary | ICD-10-CM

## 2019-12-25 DIAGNOSIS — K581 Irritable bowel syndrome with constipation: Secondary | ICD-10-CM

## 2019-12-25 DIAGNOSIS — I214 Non-ST elevation (NSTEMI) myocardial infarction: Secondary | ICD-10-CM

## 2019-12-25 NOTE — Chronic Care Management (AMB) (Signed)
Chronic Care Management Pharmacy  Name: Lindsey Bryant  MRN: [id number] DOB: Dec 14, 1940  Initial Questions: 1. Have you seen any other providers since your last visit? No  2. Any changes in your medicines or health? No   Chief Complaint/ HPI  DEEPTI GUNAWAN,  79 y.o. , female presents for their Follow-Up CCM visit with the clinical pharmacist via telephone due to COVID-19 Pandemic.  12/25/2019 Patient reported fall 2 days ago and noted since last call, BP readings: 67/46, 79/49, 83/56  12/11/2019 Patient reports having IBS symptoms for a long time and is managed on dicyclomine 10mg , 1 tablet every six hours as needed (reports needs to take 1 to 3 times per month), MIralax, 8.5 five times a week, and probiotic, 1 capsule once daily (patient notes no difference in gas/ constipation with probiotic). She reports watching types of foods that would would eat. Patient would like to consider seeing neurologist closer to home and states this was discussed with Dr. Elease Hashimoto in the past.  Patient also reports concerns with weight loss.   PCP : [name], MD  Their chronic conditions include: IBS- C, NSTEMI, NICM, hx of acute systolic heart failure, parkinson's disease  Office Visits: 01/19/2019- [name], MD- Patient presented for office visit for follow up. Patient to trial fiber supplementation and to pick up exercise with walking. Patient opted to think over local neurology groups before transitioning from [facility].    Consult Visit: 10/19/2019- Neurology- William Dalton, MD- Patient presented for office visit for parkinson's disease. Patient to continue current dose of carbidopa/ levodopa and regular exercise. Patient to follow up with Elbert Ewings in 6 months.   08/01/2019- Cardiology- Virl Axe- unable to access notes.   05/22/2019- Gastroenterology- Silvano Rusk, MD- Patient presented for office visit for IBS-C. Patient reports improvement. Patient to use  Dulcolax PRN qHS and follow up as needed.   05/02/2019- Cardiology- Virl Axe- unable to access notes.  04/17/2019- Cardiology- Virl Axe, MD- Patient presented for telehealth visit. Abdominal binder ineffective. Consider thigh sleeves. No major interventions done since patient's biggest issues are her mobility. Patient to return in 12 months.    Medications: Outpatient Encounter Medications as of 12/25/2019  Medication Sig  . aspirin EC 81 MG tablet Take 81 mg by mouth at bedtime.  . Calcium-Phosphorus-Vitamin D (CITRACAL CALCIUM GUMMIES PO) Take by mouth as directed.  . carbidopa-levodopa (SINEMET IR) 25-100 MG per tablet Take 2 tablets by mouth 6 (six) times daily.   . Cholecalciferol (VITAMIN D3) 2000 units capsule Take 2,000 Units by mouth daily.  . COCONUT OIL PO Take 6 oz by mouth daily. (Patient not taking: Reported on 12/11/2019)  . diazepam (VALIUM) 5 MG tablet Take 1 tablet (5 mg total) by mouth every 6 (six) hours as needed for anxiety or muscle spasms (IBS - take with dicyclomine). (Patient not taking: Reported on 12/18/2019)  . dicyclomine (BENTYL) 10 MG capsule TAKE 10 MG EVERY 6 HOURS AS NEEDED  . docusate sodium (COLACE) 100 MG capsule Take 100 mg by mouth daily as needed for mild constipation. (Patient not taking: Reported on 12/11/2019)  . lisinopril (ZESTRIL) 5 MG tablet Take 1 tablet (5 mg total) by mouth daily.  . melatonin 5 MG TABS Take 5 mg by mouth at bedtime.  . Omega-3 1000 MG CAPS Take 1 capsule by mouth daily.  . polyethylene glycol powder (GLYCOLAX/MIRALAX) 17 GM/SCOOP powder Take 8.5 g by mouth. 5 times a week  . Probiotic Product (PROBIOTIC  PO) Take 1 capsule by mouth daily.  Marland Kitchen Propylene Glycol (SYSTANE COMPLETE OP) Apply 1 drop to eye daily.  . Simethicone (GAS-X PO) Take 1 tablet by mouth. After meals and at bedtime  . VITAMIN E PO Take 1 capsule by mouth daily.   No facility-administered encounter medications on file as of 12/25/2019.     Current  Diagnosis/Assessment:  Goals Addressed            This Visit's Progress   . Pharmacy Care Plan       CARE PLAN ENTRY (see longitudinal plan of care for additional care plan information)  Current Barriers:  . Chronic Disease Management support, education, and care coordination needs related to  IBS, NSTEMI, Parkinson's disease  IBS- C . Pharmacist Clinical Goal(s) o Over the next 90 days, patient will work with PharmD and providers to maintain normal bowel movements.  . Current regimen:  . Dicyclomine 10mg , 1 tablet every six hours as needed . Dulcolax  . MIralax, 8.5 five times a week . Probiotic (Just Thrive), 1 capsule once daily . Interventions: o Recommend discontinuing current probiotic (Just Thrive) due to no difference seen in symptoms. If want to try another probiotic, consider probiotic with multiple strains of probiotic (for example:  Streptococcus thermophilus, Lactobacillus bulgaricus, Bifidobacterium longum, Lactobacillus acidophilus . Patient self care activities - Over the next 90 days, patient will: o Continue current medications as directed by providers.   History of NSTEMI . Pharmacist Clinical Goal(s) o Over the next 90 days, patient will work with PharmD and providers to prevent cardiac events.  . Current regimen:  o Lisinopril 5mg , 1 tablet once daily o Aspirin 81mg , 1 tablet once daily   . Interventions:  . Recommend monitoring blood pressure.  . Discuss fall risk and per Dr. Elease Hashimoto, hold lisinopril.  . Patient self care activities o Patient will continue current medications as directed.   Parkinson's disease . Pharmacist Clinical Goal(s) o Over the next 90 days, patient will work with PharmD and providers to minimize symptoms associated with parkinson's.  . Current regimen:  o Carbidopa- levodopa (Sinemet IR) 25-100mg , 2 tablets six times daily  . Interventions: o Discussed side effects of carbidopa-levodopa.  . Patient self care  activities o Patient will continue current medications as directed.  o Patient to discuss with Dr. Elease Hashimoto at upcoming visit for neurology referral closer to home.   Medication management . Pharmacist Clinical Goal(s): o Over the next 90 days, patient will work with PharmD and providers to maintain optimal medication adherence . Current [facility] . Interventions o Comprehensive medication review performed. o Continue current medication management strategy . Patient self care activities - Over the next 90 days, patient will: o Take medications as prescribed o Report any questions or concerns to PharmD and/or provider(s)  Please see past updates related to this goal by clicking on the "Past Updates" button in the selected goal         SDOH Interventions     Most Recent Value  SDOH Interventions  Transportation Interventions Intervention Not Indicated      SDOH Interventions     Most Recent Value  SDOH Interventions  Transportation Interventions Intervention Not Indicated     IBS-C   Patient has failed these meds in past: Diazepam (dizziness)   Patient is currently controlled on the following medications:   . Dicyclomine 10mg , 1 tablet every six hours as needed . Dulcolax  . MIralax, 8.5 five times a week . Probiotic (  Just Thrive), 1 capsule once daily . Gas-X, 1 tablet after meals and at bedtime    We discussed:  - different strains in probiotics  - unclear evidence of probiotic helping IBS symptoms - Just Thrive (current probiotic)- contains Bacillus indicus, Bacillus coagulans, Bacillus clausii, Bacillus subtilis  Plan Continue current medications  Recommend discontinue Just Thrive probiotic since patient reports no difference in symptoms and no clear benefits.  Could consider probiotic with greater variety of different strains.   NICM/ hx of NSTEMI   Ejection fraction:  50% - 55% (01/17/2018)  45%- 50% (02/01/2017) 35% -  40% (07/30/2016)  Office  blood pressures are  BP Readings from Last 3 Encounters:  12/26/19 140/76  05/22/19 (!) 124/50  02/27/19 102/60   Patient has failed these meds in the past: none  Patient checks BP at home 1-2x per week  Patient home BP readings are ranging:  67/46, 79/49, 83/56 Highest BP: 95/60 Lowest DBP: 40-50s  Pulse: 76, 71, 84   Patient is on:   Lisinopril 5mg , 1 tablet once daily   We discussed monitoring BP at home.  Plan Continue current medications  Managed by Dr. Caryl Comes (cardiology)   Consider hold due to recent fall and low BP readings.    Parkinson's    Patient is currently controlled on the following medications:  . Carbidopa- levodopa (Sinemet IR) 25-100mg , 2 tablets six times daily   Plan Managed by neurology [facility]) Continue current medications  Patient to consult with Dr. Elease Hashimoto at upcoming visit for referral (to ease care coordination)    Hx of NSTEMI    Patient is currently  on the following medications:  . Aspirin 81mg , 1 tablet once daily   Plan Continue current medications    OTC/ Supplements   Patient is currentlyon the following medications:  . Omega-3 1000mg  . cholecalciferol (vitamin D3) . Vitamin E . Calcium (Citracal chewables)  . Melatonin 5mg , qHS   Plan Patient to verify doses of supplements she is taking and confirm at follow up.   Vaccines   Reviewed and discussed patient's vaccination history.    Immunization History  Administered Date(s) Administered  . Influenza Split 03/03/2012  . Influenza Whole 03/24/2007, 02/26/2008, 03/13/2010  . Influenza, High Dose Seasonal PF 03/09/2013, 02/17/2015, 01/29/2017, 02/08/2018, 02/19/2019  . Influenza, Seasonal, Injecte, Preservative Fre 03/10/2014  . Influenza-Unspecified 02/24/2016  . Moderna SARS-COVID-2 Vaccination 06/23/2019, 07/21/2019  . Pneumococcal Conjugate-13 07/17/2014  . Pneumococcal Polysaccharide-23 05/24/2006  . Tdap 06/04/2011  . Zoster 02/26/2008  . Zoster  Recombinat (Shingrix) 04/06/2018, 06/15/2018, 07/21/2018    Plan No recommendations. Patient up-to-date.    Medication Management  Patient organizes medications:  - patient has own strategy. She uses piece of paper and will mark off when she has taken a dose of levodopa/carbidopa [facility] Adherence: no gaps in refill history (per medication dispense history from 06/28/19 to 12/25/19)    Follow up Patient to schedule visit with Dr. Elease Hashimoto to discuss concerns with weight loss and referral to neurologist. (Update: patient had visit and lisinopril is on hold).    Anson Crofts, PharmD Clinical Pharmacist [facility] [place] [phone]

## 2019-12-26 ENCOUNTER — Other Ambulatory Visit: Payer: Self-pay

## 2019-12-26 ENCOUNTER — Encounter: Payer: Self-pay | Admitting: Family Medicine

## 2019-12-26 ENCOUNTER — Ambulatory Visit (INDEPENDENT_AMBULATORY_CARE_PROVIDER_SITE_OTHER): Payer: Medicare HMO | Admitting: Family Medicine

## 2019-12-26 VITALS — BP 140/76 | HR 72 | Temp 97.9°F | Wt 109.3 lb

## 2019-12-26 DIAGNOSIS — I951 Orthostatic hypotension: Secondary | ICD-10-CM | POA: Diagnosis not present

## 2019-12-26 DIAGNOSIS — R634 Abnormal weight loss: Secondary | ICD-10-CM | POA: Diagnosis not present

## 2019-12-26 DIAGNOSIS — G20 Parkinson's disease: Secondary | ICD-10-CM

## 2019-12-26 NOTE — Progress Notes (Signed)
Established Patient Office Visit  Subjective:  Patient ID: Lindsey Bryant, female    DOB: [birth year]  Age: 79 y.o. MRN: [id number]  CC:  Chief Complaint  Patient presents with  . Medication Problem    Pt states medication is causing dizziness and her bp to be up     HPI Lindsey Bryant presents for weight loss and low blood pressure.  She has chronic problems including Parkinson's disease, irritable bowel syndrome, history of CAD, remote history of melanoma.  She is currently followed over at [facility] neurology but would like to establish with neurologist here in [place].  She is currently maintained on Sinemet 25/100 mg 2 tablets 6 times daily.  She has had some recent issues with dizziness which is positional and on 2 August 2 days ago she was standing in her kitchen and reportedly had syncopal episode.  Her blood pressure when they took this was 67/46.  She had several other low readings including 78/59, 95/60.  She has longstanding history of IBS and has frequent loose stools.  She is currently taking dicyclomine and diazepam combination therapy for her IBS.  No seizure history.  No recent palpitations or chest pain.  She is on low-dose lisinopril 5 mg daily following her NSTEMI Several years ago.  No recent chest pains.  She has been steadily losing some weight.  She states her appetite is slightly down.  She just yesterday started taking some Ensure.  She has chronic abdominal cramping which she relates to the IBS.  No adenopathy.  No headaches.  No dysphagia.   We had gotten information that she had lost considerable weight and had recommended setting up office follow-up to further assess.  Denies any major mood disorder.  Past Medical History:  Diagnosis Date  . Acute systolic heart failure ([place])   . Bradycardia    a. 02/2012  . Hyperglycemia   . IBS (irritable bowel syndrome)   . Melanoma ([place])    right arm  . NSTEMI (non-ST elevated myocardial infarction) ([place])   .  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 Parkinson's disease     Past Surgical History:  Procedure Laterality Date  . APPENDECTOMY  1952  . COLONOSCOPY    . EYE SURGERY  07/2017   Cataract Sx  . LEFT HEART CATH AND CORONARY ANGIOGRAPHY N/A 08/03/2016   Procedure: Left Heart Cath and Coronary Angiography;  Surgeon: Leonie Man, MD;  Location: [facility];  Service: Cardiovascular;  Laterality: N/A;  . MELANOMA EXCISION Right 2000   right arm  . PACEMAKER PLACEMENT  05/2012  . PACEMAKER REVISION N/A 03/14/2012   Procedure: PACEMAKER REVISION;  Surgeon: Thompson Grayer, MD;  Location: [facility] CATH LAB;  Service: Cardiovascular;  Laterality: N/A;  . PERMANENT PACEMAKER INSERTION N/A 03/06/2012   Procedure: PERMANENT PACEMAKER INSERTION;  Surgeon: Deboraha Sprang, MD;  Location: [facility] CATH LAB;  Service: Cardiovascular;  Laterality: N/A;  . 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 Alcohol abuse Father   . Hypothyroidism Daughter   . Heart attack Mother 77       Details unclear  . Diabetes Brother   . Breast cancer Other   . Hypothyroidism Daughter   . Colon cancer Neg Hx   . Esophageal cancer Neg Hx   . Stomach cancer Neg Hx   . Pancreatic cancer Neg Hx   . Liver disease  Neg Hx     Social History   Socioeconomic History  . Marital status: Married    Spouse name: Not on file  . Number of children: 2  . Years of education: Not on file  . Highest education level: Not on file  Occupational History  . Not on file  Tobacco Use  . Smoking status: Former Research scientist (life sciences)  . Smokeless tobacco: Never Used  . Tobacco comment: 10 yrs in college  Vaping Use  . Vaping Use: Never used  Substance and Sexual Activity  . Alcohol use: No  . Drug use: No  . Sexual activity: Not on file  Other Topics Concern  . Not on file  Social History Narrative   Retired Pharmacist, hospital, married. 2 daughters I think.   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   Social Determinants of Health   Financial Resource Strain: Low Risk   . Difficulty of Paying Living Expenses: Not hard at all  Food Insecurity:   . Worried About Charity fundraiser in the Last Year:   . Arboriculturist in the Last Year:   Transportation Needs: No Transportation Needs  . Lack of Transportation (Medical): No  . Lack of Transportation (Non-Medical): No  Physical Activity:   . Days of Exercise per Week:   . Minutes of Exercise per Session:   Stress:   . Feeling of Stress :   Social Connections:   . Frequency of Communication with Friends and Family:   . Frequency of Social Gatherings with Friends and Family:   . Attends Religious Services:   . Active Member of Clubs or Organizations:   . Attends Archivist Meetings:   Marland Kitchen Marital Status:   Intimate Partner Violence:   . Fear of Current or Ex-Partner:   . Emotionally Abused:   Marland Kitchen Physically Abused:   . Sexually Abused:     Outpatient Medications Prior to Visit  Medication Sig Dispense Refill  . aspirin EC 81 MG tablet Take 81 mg by mouth at bedtime.    . Calcium-Phosphorus-Vitamin D (CITRACAL CALCIUM GUMMIES PO) Take by mouth as directed.    . carbidopa-levodopa (SINEMET IR) 25-100 MG per tablet Take 2 tablets by mouth 6 (six) times daily.     . Cholecalciferol (VITAMIN D3) 2000 units capsule Take 2,000 Units by mouth daily.    Marland Kitchen dicyclomine (BENTYL) 10 MG capsule TAKE 10 MG EVERY 6 HOURS AS NEEDED 60 capsule 2  . lisinopril (ZESTRIL) 5 MG tablet Take 1 tablet (5 mg total) by mouth daily. 90 tablet 3  . melatonin 5 MG TABS Take 5 mg by mouth at bedtime.    . Omega-3 1000 MG CAPS Take 1 capsule by mouth daily.    . polyethylene glycol powder (GLYCOLAX/MIRALAX) 17 GM/SCOOP powder Take 8.5 g by mouth. 5 times a week    . Probiotic Product (PROBIOTIC PO) Take 1 capsule by mouth daily.    Marland Kitchen Propylene Glycol (SYSTANE COMPLETE OP) Apply 1 drop to eye daily.    . Simethicone (GAS-X PO) Take  1 tablet by mouth. After meals and at bedtime    . VITAMIN E PO Take 1 capsule by mouth daily.    . COCONUT OIL PO Take 6 oz by mouth daily. (Patient not taking: Reported on 12/11/2019)    . diazepam (VALIUM) 5 MG tablet Take 1 tablet (5 mg total) by mouth every 6 (six) hours as needed for anxiety or muscle spasms (IBS -  take with dicyclomine). (Patient not taking: Reported on 12/18/2019) 30 tablet 1  . docusate sodium (COLACE) 100 MG capsule Take 100 mg by mouth daily as needed for mild constipation. (Patient not taking: Reported on 12/11/2019)     No facility-administered medications prior to visit.    No Known Allergies  ROS Review of Systems  Constitutional: Positive for appetite change, fatigue and unexpected weight change. Negative for chills and fever.  HENT: Negative for trouble swallowing.   Respiratory: Negative for cough and shortness of breath.   Cardiovascular: Negative for chest pain, palpitations and leg swelling.  Gastrointestinal: Negative for nausea and vomiting.  Genitourinary: Negative for dysuria and hematuria.  Neurological: Positive for dizziness, tremors and light-headedness. Negative for headaches.  Hematological: Negative for adenopathy. Does not bruise/bleed easily.      Objective:    Physical Exam Vitals reviewed.  Constitutional:      Comments: Alert thin 79 year old female in no distress  Cardiovascular:     Rate and Rhythm: Normal rate and regular rhythm.  Pulmonary:     Effort: Pulmonary effort is normal.     Breath sounds: Normal breath sounds.  Abdominal:     Palpations: Abdomen is soft.     Tenderness: There is no abdominal tenderness.  Musculoskeletal:     Cervical back: Neck supple.     Right lower leg: No edema.     Left lower leg: No edema.  Lymphadenopathy:     Cervical: No cervical adenopathy.  Neurological:     Mental Status: She is alert.  Psychiatric:        Mood and Affect: Mood normal.     BP 140/76 (BP Location: Left Arm,  Patient Position: Sitting, Cuff Size: Normal)   Pulse 72   Temp 97.9 F (36.6 C) (Oral)   Wt 109 lb 4.8 oz (49.6 kg)   SpO2 94%   BMI 20.32 kg/m  Wt Readings from Last 3 Encounters:  12/26/19 109 lb 4.8 oz (49.6 kg)  05/22/19 114 lb 6 oz (51.9 kg)  04/17/19 110 lb (49.9 kg)     Health Maintenance Due  Topic Date Due  . Hepatitis C Screening  Never done  . PNA vac Low Risk Adult (2 of 2 - PPSV23) 07/18/2015  . INFLUENZA VACCINE  12/23/2019    There are no preventive care reminders to display for this patient.  Lab Results  Component Value Date   TSH 1.37 03/20/2018   Lab Results  Component Value Date   WBC 7.1 03/20/2018   HGB 12.6 03/20/2018   HCT 36.5 03/20/2018   MCV 98.2 03/20/2018   PLT 166.0 03/20/2018   Lab Results  Component Value Date   NA 136 03/20/2018   K 4.2 03/20/2018   CO2 26 03/20/2018   GLUCOSE 95 03/20/2018   BUN 14 03/20/2018   CREATININE 0.78 03/20/2018   BILITOT 0.4 03/20/2018   ALKPHOS 47 03/20/2018   AST 15 03/20/2018   ALT 7 03/20/2018   PROT 6.3 03/20/2018   ALBUMIN 4.2 03/20/2018   CALCIUM 9.3 03/20/2018   ANIONGAP 8 08/04/2016   GFR 76.19 03/20/2018   Lab Results  Component Value Date   CHOL 184 03/20/2018   Lab Results  Component Value Date   HDL 71.20 03/20/2018   Lab Results  Component Value Date   LDLCALC 92 03/20/2018   Lab Results  Component Value Date   TRIG 104.0 03/20/2018   Lab Results  Component Value Date   CHOLHDL 3  03/20/2018   Lab Results  Component Value Date   HGBA1C 5.3 07/29/2016      Assessment & Plan:   #1 dizziness with several recent low blood pressure readings below 852 systolic.  She had brief syncopal episode 2 days ago likely related to significant orthostasis.  Increased risk factors of Sinemet therapy and weight loss Her blood pressure went from 138/68 left arm seated to 100/60 standing  -Focus on good hydration -Hold lisinopril for now -Liberalize sodium intake slightly -Set  up 3-week follow-up to reassess orthostatic blood pressures -Discuss orthostatic issues with her neurologist  #2 Parkinson's disease.  Fairly high-dose Sinemet use as above. -Patient requesting referral to local neurologist and we will set that up  #3 weight loss.  Etiology unclear. -Obtain follow-up labs with CBC, comprehensive metabolic panel, TSH, sed rate -Ensure supplement -3-week follow-up to reassess  No orders of the defined types were placed in this encounter.   Follow-up: Return in about 3 weeks (around 01/16/2020).    [name], MD

## 2019-12-26 NOTE — Patient Instructions (Signed)
HOLD the Lisinopril for now  Stay well hydrated.  Liberalize sodium somewhat  Continue with Ensure supplement  Change positions slowly.  I will set up follow up with Dr Tat.

## 2019-12-27 LAB — CBC WITH DIFFERENTIAL/PLATELET
Absolute Monocytes: 592 cells/uL (ref 200–950)
Basophils Absolute: 48 cells/uL (ref 0–200)
Basophils Relative: 0.7 %
Eosinophils Absolute: 68 cells/uL (ref 15–500)
Eosinophils Relative: 1 %
HCT: 37.9 % (ref 35.0–45.0)
Hemoglobin: 12.8 g/dL (ref 11.7–15.5)
Lymphs Abs: 1414 cells/uL (ref 850–3900)
MCH: 33.5 pg — ABNORMAL HIGH (ref 27.0–33.0)
MCHC: 33.8 g/dL (ref 32.0–36.0)
MCV: 99.2 fL (ref 80.0–100.0)
MPV: 11.4 fL (ref 7.5–12.5)
Monocytes Relative: 8.7 %
Neutro Abs: 4678 cells/uL (ref 1500–7800)
Neutrophils Relative %: 68.8 %
Platelets: 180 10*3/uL (ref 140–400)
RBC: 3.82 10*6/uL (ref 3.80–5.10)
RDW: 11.9 % (ref 11.0–15.0)
Total Lymphocyte: 20.8 %
WBC: 6.8 10*3/uL (ref 3.8–10.8)

## 2019-12-27 LAB — BASIC METABOLIC PANEL
BUN: 10 mg/dL (ref 7–25)
CO2: 32 mmol/L (ref 20–32)
Calcium: 9.9 mg/dL (ref 8.6–10.4)
Chloride: 98 mmol/L (ref 98–110)
Creat: 0.78 mg/dL (ref 0.60–0.93)
Glucose, Bld: 102 mg/dL — ABNORMAL HIGH (ref 65–99)
Potassium: 4.8 mmol/L (ref 3.5–5.3)
Sodium: 133 mmol/L — ABNORMAL LOW (ref 135–146)

## 2019-12-27 LAB — HEPATIC FUNCTION PANEL
AG Ratio: 1.7 (calc) (ref 1.0–2.5)
ALT: 5 U/L — ABNORMAL LOW (ref 6–29)
AST: 18 U/L (ref 10–35)
Albumin: 4.2 g/dL (ref 3.6–5.1)
Alkaline phosphatase (APISO): 50 U/L (ref 37–153)
Bilirubin, Direct: 0.1 mg/dL (ref 0.0–0.2)
Globulin: 2.5 g/dL (calc) (ref 1.9–3.7)
Indirect Bilirubin: 0.3 mg/dL (calc) (ref 0.2–1.2)
Total Bilirubin: 0.4 mg/dL (ref 0.2–1.2)
Total Protein: 6.7 g/dL (ref 6.1–8.1)

## 2019-12-27 LAB — LIPID PANEL
Cholesterol: 194 mg/dL (ref <200)
HDL: 84 mg/dL (ref >=50)
LDL Cholesterol (Calc): 91 mg/dL (calc)
Non-HDL Cholesterol (Calc): 110 mg/dL (calc) (ref <130)
Total CHOL/HDL Ratio: 2.3 (calc) (ref <5.0)
Triglycerides: 94 mg/dL (ref <150)

## 2019-12-27 LAB — SEDIMENTATION RATE: Sed Rate: 2 mm/h (ref 0–30)

## 2019-12-27 LAB — TSH: TSH: 1.45 mIU/L (ref 0.40–4.50)

## 2019-12-27 NOTE — Patient Instructions (Addendum)
Visit Information  Goals Addressed            This Visit's Progress   . Pharmacy Care Plan       CARE PLAN ENTRY (see longitudinal plan of care for additional care plan information)  Current Barriers:  . Chronic Disease Management support, education, and care coordination needs related to  IBS, NSTEMI, Parkinson's disease  IBS- C . Pharmacist Clinical Goal(s) o Over the next 90 days, patient will work with PharmD and providers to maintain normal bowel movements.  . Current regimen:  . Dicyclomine 10mg , 1 tablet every six hours as needed . Dulcolax  . MIralax, 8.5 five times a week . Probiotic (Just Thrive), 1 capsule once daily . Interventions: o Recommend discontinuing current probiotic (Just Thrive) due to no difference seen in symptoms. If want to try another probiotic, consider probiotic with multiple strains of probiotic (for example:  Streptococcus thermophilus, Lactobacillus bulgaricus, Bifidobacterium longum, Lactobacillus acidophilus . Patient self care activities - Over the next 90 days, patient will: o Continue current medications as directed by providers.   History of NSTEMI . Pharmacist Clinical Goal(s) o Over the next 90 days, patient will work with PharmD and providers to prevent cardiac events.  . Current regimen:  o Lisinopril 5mg , 1 tablet once daily o Aspirin 81mg , 1 tablet once daily   . Interventions:  . Recommend monitoring blood pressure.  . Discuss fall risk and per Dr. Elease Hashimoto, hold lisinopril.  . Patient self care activities o Patient will continue current medications as directed.   Parkinson's disease . Pharmacist Clinical Goal(s) o Over the next 90 days, patient will work with PharmD and providers to minimize symptoms associated with parkinson's.  . Current regimen:  o Carbidopa- levodopa (Sinemet IR) 25-100mg , 2 tablets six times daily  . Interventions: o Discussed side effects of carbidopa-levodopa.  . Patient self care  activities o Patient will continue current medications as directed.  o Patient to discuss with Dr. Elease Hashimoto at upcoming visit for neurology referral closer to home.   Medication management . Pharmacist Clinical Goal(s): o Over the next 90 days, patient will work with PharmD and providers to maintain optimal medication adherence . [facility] . Interventions o Comprehensive medication review performed. o Continue current medication management strategy . Patient self care activities - Over the next 90 days, patient will: o Take medications as prescribed o Report any questions or concerns to PharmD and/or provider(s)  Please see past updates related to this goal by clicking on the "Past Updates" button in the selected goal         The patient verbalized understanding of instructions provided today and agreed to receive a mailed copy of patient instruction and/or educational materials.  The pharmacy team will reach out to the patient again over the next 60 days.   Anson Crofts, PharmD Clinical Pharmacist [facility] [place] [phone]

## 2019-12-31 NOTE — Progress Notes (Signed)
Assessment/Plan:    1.  Parkinsons Disease, sx's since 2006  -Patient previously with Dr. Isidor Holts and [facility] movement disorders.  -Patient to continue carbidopa/levodopa 25/100, 2 tablets 6 times per day.  -exercise  -Declined physical therapy stating that she is really doing fairly well (her walk did look well).  2.  Neurogenic Orthostatic Hypotension  -lisinopril just d/c few days ago, but with continued symptoms that are disabling  -hydration and the importance of this discussed.  -start midodrine 2.5 mg tid.  R/B/SE were discussed.  The opportunity to ask questions was given and they were answered to the best of my ability.  The patient expressed understanding and willingness to follow the outlined treatment protocols.  Discussed risks of supine hypertension   3.  Abdominal pain, with history of IBS and constipation/diarrhea  -Discussed importance of following back up with Dr. [name].  -Copy of rancho recipe given.  Subjective:   Lindsey Bryant was seen today in the movement disorders clinic for neurologic consultation at the request of Burchette, Alinda Sierras, MD.  The consultation is for the evaluation of Parkinson's disease.  This patient is accompanied in the office by her friend (and AA sponsor) who supplements the history.I have taken extensive review through the patient's neurologic records made available to me.  The patient was seen by Dr. Erline Levine in December, 2013, although he was not her first neurologist (Dr. Erling Cruz was), but he was the first neurologist records that I have access to.  She began having right hand tremor in 2006, left hand tremor in 2008, leg tremor in 2010.  She saw neurology locally and was started on amantadine initially.  That did not seem to provide much benefit and she was later changed to Artane in 2013.  By the time she saw Dr. 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  With the course of time, her medication was slowly increased with him to a total of carbidopa/levodopa 25/100, 1.5 tablets 4 times per day.  Dr. Erline Levine left [facility], and the patient changed to Dr. Tonye Royalty in January, 2018, at which point the patient was on carbidopa/levodopa 25/100, 2 tablets 5 times per day.  She [facility].  By August, 2020, she was on carbidopa/levodopa 25/100, 6 times per day.  She was last seen by Dr. Lula Olszewski on Oct 19, 2019 and remains on carbidopa/levodopa 25/100, 2 tablets 6 times per day.  Current movement disorder medications: Carbidopa/levodopa 25/100, 2 tablets 6 times per day (6:30am, 9:30am, 12:30pm, 3:30, 6:30, 10:30pm)   Specific Symptoms:  Tremor: Yes.   , R leg Family hx of similar:  No. Voice: no voice therapy but it has changed - "its not my priority" Sleep:   Vivid Dreams:  No.  Acting out dreams:  Yes.   - "I yell out" Wet Pillows: Yes.   Postural symptoms:  Yes.   - just over the last few weeks - and relates to abdominal pain/gas pains - saw Dr. [name] in 04/2019   Falls?  No. Bradykinesia symptoms: slow movements Loss of smell:  No. not since quit smoking Loss of taste:  Yes.   Urinary Incontinence:  No. Difficulty Swallowing:  No. Handwriting, micrographia: Yes.   Trouble with ADL's:  No.  Trouble buttoning clothing: No. Depression:  No. Memory changes:  Yes.  , but mild.  Has always written things down.   Hallucinations:  No.  visual distortions: No. N/V:  Yes.   with abdominal pain Lightheaded:  Yes.    Syncope: Yes.  , related to being given meds for bowels Diplopia:  No. Dyskinesia:  No. Prior exposure to reglan/antipsychotics: No.   PREVIOUS MEDICATIONS: amantadine (tried it very early on for tremor); trihexyphenidyl (tried it early on, prior to addition of levodopa); prior records indicate that they did not want to ever try dopamine  agonist due to history of alcohol abuse  ALLERGIES:  No Known Allergies  CURRENT MEDICATIONS:  Current Outpatient Medications  Medication Instructions   aspirin EC 81 mg, Daily at bedtime   Calcium-Phosphorus-Vitamin D (CITRACAL CALCIUM GUMMIES PO) Oral, As directed   carbidopa-levodopa (SINEMET IR) 25-100 MG per tablet 2 tablets, Oral, 6 times daily   COCONUT OIL PO 6 oz, Oral, Daily   dicyclomine (BENTYL) 10 MG capsule TAKE 10 MG EVERY 6 HOURS AS NEEDED   lisinopril (ZESTRIL) 5 mg, Oral, Daily   melatonin 5 mg, Oral, Daily at bedtime   Omega-3 1000 MG CAPS 1 capsule, Oral, Daily   polyethylene glycol powder (GLYCOLAX/MIRALAX) 8.5 g, Oral, 5 times a week    Probiotic Product (PROBIOTIC PO) 1 capsule, Oral, Daily   Propylene Glycol (SYSTANE COMPLETE OP) 1 drop, Ophthalmic, Daily   Simethicone (GAS-X PO) 1 tablet, Oral, After meals and at bedtime    Vitamin D3 2,000 Units, Oral, Daily   VITAMIN E PO 1 capsule, Oral, Daily    Objective:   VITALS:   Vitals:   01/01/20 1341  BP: (!) 76/47  Pulse: 94  SpO2: 96%  Weight: 108 lb (49 kg)  Height: 5\' 2"  (1.575 m)    GEN:  The patient appears stated age and is in NAD. HEENT:  Normocephalic, atraumatic.  The mucous membranes are moist. The superficial temporal arteries are without ropiness or tenderness. CV:  RRR Lungs:  CTAB Neck/HEME:  There are no carotid bruits bilaterally.  Neurological examination:  Orientation: The patient is alert and oriented x3.  Cranial nerves: There is good facial symmetry. Extraocular muscles are intact. The visual fields are full to confrontational testing. The speech is fluent and clear. Soft palate rises symmetrically and there is no tongue deviation. Hearing is intact to conversational tone. Sensation: Sensation is intact to light and pinprick throughout (facial, trunk, extremities). Vibration is intact at the bilateral big toe. There is no extinction with double simultaneous  stimulation. There is no sensory dermatomal level identified. Motor: Strength is 5/5 in the bilateral upper and lower extremities.   Shoulder shrug is equal and symmetric.  There is no pronator drift. Deep tendon reflexes: Deep tendon reflexes are 2/4 at the bilateral biceps, triceps, brachioradialis, patella and achilles. Plantar responses are downgoing bilaterally.  Movement examination: Tone: There is normal tone in the bilateral upper extremities.  The tone in the lower extremities is normal.  Abnormal movements: there is intermittent rare RLE rest tremor; there is mild dyskinesia of the L shoulder Coordination:  There is mild decremation with RAM's Gait and Station: The patient has no difficulty arising out of a deep-seated chair without the use of the hands. The patient's stride length is good with good arm swing.   I have reviewed and interpreted the following labs independently   Chemistry      Component Value Date/Time   NA 133 (L) 12/26/2019 1454   NA  141 01/03/2017 1515   K 4.8 12/26/2019 1454   CL 98 12/26/2019 1454   CO2 32 12/26/2019 1454   BUN 10 12/26/2019 1454   BUN 13 01/03/2017 1515   CREATININE 0.78 12/26/2019 1454      Component Value Date/Time   CALCIUM 9.9 12/26/2019 1454   ALKPHOS 47 03/20/2018 1546   AST 18 12/26/2019 1454   ALT 5 (L) 12/26/2019 1454   BILITOT 0.4 12/26/2019 1454      Lab Results  Component Value Date   TSH 1.45 12/26/2019   Lab Results  Component Value Date   WBC 6.8 12/26/2019   HGB 12.8 12/26/2019   HCT 37.9 12/26/2019   MCV 99.2 12/26/2019   PLT 180 12/26/2019     Total time spent on today's visit was 60 minutes, including both face-to-face time and nonface-to-face time.  Time included that spent on review of records (prior notes available to me/labs/imaging if pertinent), discussing treatment and goals, answering patient's questions and coordinating care.  Cc:  [name], MD

## 2020-01-01 ENCOUNTER — Other Ambulatory Visit: Payer: Self-pay

## 2020-01-01 ENCOUNTER — Encounter: Payer: Self-pay | Admitting: Neurology

## 2020-01-01 ENCOUNTER — Ambulatory Visit (INDEPENDENT_AMBULATORY_CARE_PROVIDER_SITE_OTHER): Payer: Medicare HMO | Admitting: Neurology

## 2020-01-01 VITALS — BP 76/47 | HR 94 | Ht 62.0 in | Wt 108.0 lb

## 2020-01-01 DIAGNOSIS — G20 Parkinson's disease: Secondary | ICD-10-CM

## 2020-01-01 DIAGNOSIS — G903 Multi-system degeneration of the autonomic nervous system: Secondary | ICD-10-CM | POA: Diagnosis not present

## 2020-01-01 MED ORDER — MIDODRINE HCL 2.5 MG PO TABS: 2.5000 mg | ORAL_TABLET | Freq: Three times a day (TID) | ORAL | 1 refills | Status: DC

## 2020-01-01 NOTE — Patient Instructions (Addendum)
Constipation and Parkinson's disease:  1.Rancho recipe for constipation in Parkinsons Disease:  -1 cup of unprocessed bran (need to get this at [employer], [employer] or similar type of store), 2 cups of applesauce in 1 cup of prune juice 2.  Increase fiber intake (Metamucil,vegetables) 3.  Regular, moderate exercise can be beneficial. 4.  Avoid medications causing constipation, such as medications like antacids with calcium or magnesium 5.  It's okay to take daily Miralax, and taper if stools become too loose or you experience diarrhea 6.  Stool softeners (Colace) can help with chronic constipation and I recommend you take this daily. 7.  Increase water intake.  You should be drinking 1/2 gallon of water a day as long as you have not been diagnosed with congestive heart failure or renal/kidney failure.  This is probably the single greatest thing that you can do to help your constipation.  2.  Start midodrine - 2.5 mg three times per day with meals.  Raise head of bed.    The physicians and staff at [facility] are committed to providing excellent care. You may receive a survey requesting feedback about your experience at our office. We strive to receive "very good" responses to the survey questions. If you feel that your experience would prevent you from giving the office a "very good " response, please contact our office to try to remedy the situation. We may be reached at [phone]. Thank you for taking the time out of your busy day to complete the survey.

## 2020-01-04 ENCOUNTER — Other Ambulatory Visit: Payer: Self-pay | Admitting: Internal Medicine

## 2020-01-21 ENCOUNTER — Ambulatory Visit (INDEPENDENT_AMBULATORY_CARE_PROVIDER_SITE_OTHER): Payer: Medicare HMO | Admitting: Family Medicine

## 2020-01-21 ENCOUNTER — Encounter: Payer: Self-pay | Admitting: Family Medicine

## 2020-01-21 ENCOUNTER — Other Ambulatory Visit: Payer: Self-pay

## 2020-01-21 ENCOUNTER — Other Ambulatory Visit: Payer: Self-pay | Admitting: Internal Medicine

## 2020-01-21 VITALS — BP 90/60 | HR 74 | Temp 97.7°F | Wt 107.0 lb

## 2020-01-21 DIAGNOSIS — G903 Multi-system degeneration of the autonomic nervous system: Secondary | ICD-10-CM | POA: Diagnosis not present

## 2020-01-21 DIAGNOSIS — R42 Dizziness and giddiness: Secondary | ICD-10-CM

## 2020-01-21 DIAGNOSIS — R634 Abnormal weight loss: Secondary | ICD-10-CM

## 2020-01-21 DIAGNOSIS — K581 Irritable bowel syndrome with constipation: Secondary | ICD-10-CM | POA: Diagnosis not present

## 2020-01-21 NOTE — Patient Instructions (Signed)
Low-FODMAP Eating Plan  FODMAPs (fermentable oligosaccharides, disaccharides, monosaccharides, and polyols) are sugars that are hard for some people to digest. A low-FODMAP eating plan may help some people who have bowel (intestinal) diseases to manage their symptoms. This meal plan can be complicated to follow. Work with a diet and nutrition specialist (dietitian) to make a low-FODMAP eating plan that is right for you. A dietitian can make sure that you get enough nutrition from this diet. What are tips for following this plan? Reading food labels  Check labels for hidden FODMAPs such as: ? High-fructose syrup. ? Honey. ? Agave. ? Natural fruit flavors. ? Onion or garlic powder.  Choose low-FODMAP foods that contain 3-4 grams of fiber per serving.  Check food labels for serving sizes. Eat only one serving at a time to make sure FODMAP levels stay low. Meal planning  Follow a low-FODMAP eating plan for up to 6 weeks, or as told by your health care provider or dietitian.  To follow the eating plan: 1. Eliminate high-FODMAP foods from your diet completely. 2. Gradually reintroduce high-FODMAP foods into your diet one at a time. Most people should wait a few days after introducing one high-FODMAP food before they introduce the next high-FODMAP food. Your dietitian can recommend how quickly you may reintroduce foods. 3. Keep a daily record of what you eat and drink, and make note of any symptoms that you have after eating. 4. Review your daily record with a dietitian regularly. Your dietitian can help you identify which foods you can eat and which foods you should avoid. General tips  Drink enough fluid each day to keep your urine pale yellow.  Avoid processed foods. These often have added sugar and may be high in FODMAPs.  Avoid most dairy products, whole grains, and sweeteners.  Work with a dietitian to make sure you get enough fiber in your diet. Recommended  foods Grains  Gluten-free grains, such as rice, oats, buckwheat, quinoa, corn, polenta, and millet. Gluten-free pasta, bread, or cereal. Rice noodles. Corn tortillas. Vegetables  Eggplant, zucchini, cucumber, peppers, green beans, Brussels sprouts, bean sprouts, lettuce, arugula, kale, Swiss chard, spinach, collard greens, bok choy, summer squash, potato, and tomato. Limited amounts of corn, carrot, and sweet potato. Green parts of scallions. Fruits  Bananas, oranges, lemons, limes, blueberries, raspberries, strawberries, grapes, cantaloupe, honeydew melon, kiwi, papaya, passion fruit, and pineapple. Limited amounts of dried cranberries, banana chips, and shredded coconut. Dairy  Lactose-free milk, yogurt, and kefir. Lactose-free cottage cheese and ice cream. Non-dairy milks, such as almond, coconut, hemp, and rice milk. Yogurts made of non-dairy milks. Limited amounts of goat cheese, brie, mozzarella, parmesan, swiss, and other hard cheeses. Meats and other protein foods  Unseasoned beef, pork, poultry, or fish. Eggs. Bacon. Tofu (firm) and tempeh. Limited amounts of nuts and seeds, such as almonds, walnuts, brazil nuts, pecans, peanuts, pumpkin seeds, chia seeds, and sunflower seeds. Fats and oils  Butter-free spreads. Vegetable oils, such as olive, canola, and sunflower oil. Seasoning and other foods  Artificial sweeteners with names that do not end in "ol" such as aspartame, saccharine, and stevia. Maple syrup, white table sugar, raw sugar, brown sugar, and molasses. Fresh basil, coriander, parsley, rosemary, and thyme. Beverages  Water and mineral water. Sugar-sweetened soft drinks. Small amounts of orange juice or cranberry juice. Black and green tea. Most dry wines. Coffee. This may not be a complete list of low-FODMAP foods. Talk with your dietitian for more information. Foods to avoid Grains  Wheat,   including kamut, durum, and semolina. Barley and bulgur. Couscous. Wheat-based  cereals. Wheat noodles, bread, crackers, and pastries. Vegetables  Chicory root, artichoke, asparagus, cabbage, snow peas, sugar snap peas, mushrooms, and cauliflower. Onions, garlic, leeks, and the white part of scallions. Fruits  Fresh, dried, and juiced forms of apple, pear, watermelon, peach, plum, cherries, apricots, blackberries, boysenberries, figs, nectarines, and mango. Avocado. Dairy  Milk, yogurt, ice cream, and soft cheese. Cream and sour cream. Milk-based sauces. Custard. Meats and other protein foods  Fried or fatty meat. Sausage. Cashews and pistachios. Soybeans, baked beans, black beans, chickpeas, kidney beans, fava beans, navy beans, lentils, and split peas. Seasoning and other foods  Any sugar-free gum or candy. Foods that contain artificial sweeteners such as sorbitol, mannitol, isomalt, or xylitol. Foods that contain honey, high-fructose corn syrup, or agave. Bouillon, vegetable stock, beef stock, and chicken stock. Garlic and onion powder. Condiments made with onion, such as hummus, chutney, pickles, relish, salad dressing, and salsa. Tomato paste. Beverages  Chicory-based drinks. Coffee substitutes. Chamomile tea. Fennel tea. Sweet or fortified wines such as port or sherry. Diet soft drinks made with isomalt, mannitol, maltitol, sorbitol, or xylitol. Apple, pear, and mango juice. Juices with high-fructose corn syrup. This may not be a complete list of high-FODMAP foods. Talk with your dietitian to discuss what dietary choices are best for you.  Summary  A low-FODMAP eating plan is a short-term diet that eliminates FODMAPs from your diet to help ease symptoms of certain bowel diseases.  The eating plan usually lasts up to 6 weeks. After that, high-FODMAP foods are restarted gradually, one at a time, so you can find out which may be causing symptoms.  A low-FODMAP eating plan can be complicated. It is best to work with a dietitian who has experience with this type of  plan. This information is not intended to replace advice given to you by your health care provider. Make sure you discuss any questions you have with your health care provider. Document Revised: 04/22/2017 Document Reviewed: 01/04/2017 Elsevier Patient Education  2020 [employer]  

## 2020-01-21 NOTE — Progress Notes (Signed)
Established Patient Office Visit  Subjective:  Patient ID: Lindsey Bryant, female    DOB: 1940/06/13  Age: 79 y.o. MRN: [id number]  CC: No chief complaint on file.   HPI SEQUOYAH RAMONE presents for follow-up regarding unintentional weight loss and orthostatic hypotension.  We discontinued lisinopril last visit but she continued to have some dizziness.  She has not had any further syncopal episodes.  She reestablished with neurologist here locally and they started midodrine 2.5 mg 3 times daily.  She has had some improvement since then but still has some positional dizziness.  They have seen some improvement though in her home blood pressures.  Most of these have been around the range of 90 systolic and 60 diastolic.  She is taking great care to stay well-hydrated.  She is drinking at least 2 L/day.  We obtained several recent screening labs and these were unremarkable.  Sed rate was 2.  Her weight is down another pound.  She continues to have significant chronic GI complaints probably related to her IBS.  She has seen gastroenterologist in the past.  She has been very selective about what she eats.  She is doing Ensure supplement.  She has had increased gas symptoms with multiple foods.  Past Medical History:  Diagnosis Date  . Acute systolic heart failure ([place])   . Bradycardia    a. 02/2012  . Hyperglycemia   . IBS (irritable bowel syndrome)   . Melanoma ([place])    right arm  . NSTEMI (non-ST elevated myocardial infarction) ([place])   . 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 Parkinson's disease     Past Surgical History:  Procedure Laterality Date  . APPENDECTOMY  1952  . COLONOSCOPY    . EYE SURGERY  07/2017   Cataract Sx  . LEFT HEART CATH AND CORONARY ANGIOGRAPHY N/A 08/03/2016   Procedure: Left Heart Cath and Coronary Angiography;  Surgeon: Leonie Man, MD;  Location: [facility];  Service: Cardiovascular;  Laterality: N/A;  . MELANOMA EXCISION Right 2000   right arm  .  PACEMAKER PLACEMENT  05/2012  . PACEMAKER REVISION N/A 03/14/2012   Procedure: PACEMAKER REVISION;  Surgeon: Thompson Grayer, MD;  Location: [facility] CATH LAB;  Service: Cardiovascular;  Laterality: N/A;  . PERMANENT PACEMAKER INSERTION N/A 03/06/2012   Procedure: PERMANENT PACEMAKER INSERTION;  Surgeon: Deboraha Sprang, MD;  Location: [facility] LAB;  Service: Cardiovascular;  Laterality: N/A;  . 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 Alcohol abuse Father   . Hypothyroidism Daughter   . Heart attack Mother 73       Details unclear  . Diabetes Brother   . Breast cancer Other   . Hypothyroidism Daughter   . Colon cancer Neg Hx   . Esophageal cancer Neg Hx   . Stomach cancer Neg Hx   . Pancreatic cancer Neg Hx   . Liver disease Neg Hx     Social History   Socioeconomic History  . Marital status: Married    Spouse name: Not on file  . Number of children: 2  . Years of education: Not on file  . Highest education level: Not on file  Occupational History  . Not on file  Tobacco Use  . Smoking status: Former Research scientist (life sciences)  . Smokeless tobacco: Never Used  . Tobacco comment: 10 yrs in college  Vaping Use  . Vaping Use:  Never used  Substance and Sexual Activity  . Alcohol use: No    Comment: recovering alcoholic  . Drug use: No  . Sexual activity: Not on file  Other Topics Concern  . Not on file  Social History Narrative   Retired Pharmacist, hospital, married. 2 daughters I think.   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   Social Determinants of Health   Financial Resource Strain: Low Risk   . Difficulty of Paying Living Expenses: Not hard at all  Food Insecurity:   . Worried About Charity fundraiser in the Last Year: Not on file  . Ran Out of Food in the Last Year: Not on file  Transportation Needs: No Transportation Needs  . Lack of Transportation (Medical): No  . Lack of Transportation (Non-Medical): No  Physical  Activity:   . Days of Exercise per Week: Not on file  . Minutes of Exercise per Session: Not on file  Stress:   . Feeling of Stress : Not on file  Social Connections:   . Frequency of Communication with Friends and Family: Not on file  . Frequency of Social Gatherings with Friends and Family: Not on file  . Attends Religious Services: Not on file  . Active Member of Clubs or Organizations: Not on file  . Attends Archivist Meetings: Not on file  . Marital Status: Not on file  Intimate Partner Violence:   . Fear of Current or Ex-Partner: Not on file  . Emotionally Abused: Not on file  . Physically Abused: Not on file  . Sexually Abused: Not on file    Outpatient Medications Prior to Visit  Medication Sig Dispense Refill  . aspirin EC 81 MG tablet Take 81 mg by mouth at bedtime.    . Calcium-Phosphorus-Vitamin D (CITRACAL CALCIUM GUMMIES PO) Take by mouth as directed.    . carbidopa-levodopa (SINEMET IR) 25-100 MG per tablet Take 2 tablets by mouth 6 (six) times daily.     . Cholecalciferol (VITAMIN D3) 2000 units capsule Take 2,000 Units by mouth daily.    . COCONUT OIL PO Take 6 oz by mouth daily.     . melatonin 5 MG TABS Take 5 mg by mouth at bedtime.    . midodrine (PROAMATINE) 2.5 MG tablet Take 1 tablet (2.5 mg total) by mouth 3 (three) times daily with meals. 270 tablet 1  . Omega-3 1000 MG CAPS Take 1 capsule by mouth daily.    . polyethylene glycol powder (GLYCOLAX/MIRALAX) 17 GM/SCOOP powder Take 8.5 g by mouth. 5 times a week    . Probiotic Product (PROBIOTIC PO) Take 1 capsule by mouth daily.    Marland Kitchen Propylene Glycol (SYSTANE COMPLETE OP) Apply 1 drop to eye daily.    . Simethicone (GAS-X PO) Take 1 tablet by mouth. After meals and at bedtime    . VITAMIN E PO Take 1 capsule by mouth daily.    Marland Kitchen dicyclomine (BENTYL) 10 MG capsule TAKE 1 CAPSULE EVERY 6 HOURS AS NEEDED 60 capsule 2   No facility-administered medications prior to visit.    No Known  Allergies  ROS Review of Systems  Constitutional: Negative for appetite change, chills and fever.  Respiratory: Negative for cough and shortness of breath.   Cardiovascular: Negative for chest pain.  Gastrointestinal: Negative for nausea and vomiting.  Genitourinary: Negative for dysuria.  Neurological: Positive for light-headedness.      Objective:    Physical Exam Vitals reviewed.  Cardiovascular:  Rate and Rhythm: Normal rate and regular rhythm.  Pulmonary:     Effort: Pulmonary effort is normal.     Breath sounds: Normal breath sounds.  Musculoskeletal:     Right lower leg: No edema.     Left lower leg: No edema.  Neurological:     General: No focal deficit present.     Mental Status: She is alert.     BP 90/60   Pulse 74   Temp 97.7 F (36.5 C) (Oral)   Wt 107 lb (48.5 kg)   SpO2 98%   BMI 19.57 kg/m  Wt Readings from Last 3 Encounters:  01/21/20 107 lb (48.5 kg)  01/01/20 108 lb (49 kg)  12/26/19 109 lb 4.8 oz (49.6 kg)     Health Maintenance Due  Topic Date Due  . Hepatitis C Screening  Never done  . PNA vac Low Risk Adult (2 of 2 - PPSV23) 07/18/2015  . INFLUENZA VACCINE  12/23/2019    There are no preventive care reminders to display for this patient.  Lab Results  Component Value Date   TSH 1.45 12/26/2019   Lab Results  Component Value Date   WBC 6.8 12/26/2019   HGB 12.8 12/26/2019   HCT 37.9 12/26/2019   MCV 99.2 12/26/2019   PLT 180 12/26/2019   Lab Results  Component Value Date   NA 133 (L) 12/26/2019   K 4.8 12/26/2019   CO2 32 12/26/2019   GLUCOSE 102 (H) 12/26/2019   BUN 10 12/26/2019   CREATININE 0.78 12/26/2019   BILITOT 0.4 12/26/2019   ALKPHOS 47 03/20/2018   AST 18 12/26/2019   ALT 5 (L) 12/26/2019   PROT 6.7 12/26/2019   ALBUMIN 4.2 03/20/2018   CALCIUM 9.9 12/26/2019   ANIONGAP 8 08/04/2016   GFR 76.19 03/20/2018   Lab Results  Component Value Date   CHOL 194 12/26/2019   Lab Results  Component Value  Date   HDL 84 12/26/2019   Lab Results  Component Value Date   LDLCALC 91 12/26/2019   Lab Results  Component Value Date   TRIG 94 12/26/2019   Lab Results  Component Value Date   CHOLHDL 2.3 12/26/2019   Lab Results  Component Value Date   HGBA1C 5.3 07/29/2016      Assessment & Plan:   #1 orthostatic hypotension/neurogenic orthostasis-improved slightly with midodrine.  Blood pressure today left arm seated 100/60 and standing 84/58.  Patient asymptomatic  -Reminder to get up slowly and change positions gradually -Continue midodrine 2.5 mg 3 times daily and could titrate further if necessary -Reminder to stay well-hydrated which she seems to be doing at this time  #2 unintentional weight loss.  Recent labs reassuring.  -Set up referral to nutrition therapy for further guidance -We have set up 97-month follow-up to reassess  #3 chronic IBS symptoms. -Discussed low FODMAP eating plan -She will schedule follow-up with her gastroenterologist  No orders of the defined types were placed in this encounter.   Follow-up: Return in about 3 months (around 04/22/2020).    [name], MD

## 2020-01-30 ENCOUNTER — Ambulatory Visit (INDEPENDENT_AMBULATORY_CARE_PROVIDER_SITE_OTHER): Payer: Medicare HMO | Admitting: *Deleted

## 2020-01-30 DIAGNOSIS — I442 Atrioventricular block, complete: Secondary | ICD-10-CM | POA: Diagnosis not present

## 2020-02-02 LAB — CUP PACEART REMOTE DEVICE CHECK
Battery Impedance: 921 Ohm
Battery Remaining Longevity: 63 mo
Battery Voltage: 2.77 V
Brady Statistic AP VP Percent: 18 %
Brady Statistic AP VS Percent: 0 %
Brady Statistic AS VP Percent: 82 %
Brady Statistic AS VS Percent: 0 %
Date Time Interrogation Session: 20210910140048
Implantable Lead Implant Date: 20131014
Implantable Lead Implant Date: 20131014
Implantable Lead Location: 753859
Implantable Lead Location: 753860
Implantable Lead Model: 5076
Implantable Lead Model: 5076
Implantable Pulse Generator Implant Date: 20131014
Lead Channel Impedance Value: 443 Ohm
Lead Channel Impedance Value: 673 Ohm
Lead Channel Pacing Threshold Amplitude: 0.5 V
Lead Channel Pacing Threshold Amplitude: 0.625 V
Lead Channel Pacing Threshold Pulse Width: 0.4 ms
Lead Channel Pacing Threshold Pulse Width: 0.4 ms
Lead Channel Setting Pacing Amplitude: 2 V
Lead Channel Setting Pacing Amplitude: 2.5 V
Lead Channel Setting Pacing Pulse Width: 0.4 ms
Lead Channel Setting Sensing Sensitivity: 4 mV

## 2020-02-05 NOTE — Progress Notes (Signed)
Remote pacemaker transmission.   

## 2020-02-06 ENCOUNTER — Other Ambulatory Visit: Payer: Self-pay | Admitting: Internal Medicine

## 2020-02-19 ENCOUNTER — Encounter: Payer: Self-pay | Admitting: Family Medicine

## 2020-02-20 ENCOUNTER — Other Ambulatory Visit: Payer: Self-pay | Admitting: Internal Medicine

## 2020-02-20 NOTE — Progress Notes (Signed)
Electrophysiology Office Note Date: 02/21/2020  ID:  Lindsey Bryant, Lindsey Bryant 1941-01-28, MRN [id number]  PCP: [name], MD Primary Cardiologist: Virl Axe, MD Electrophysiologist: Virl Axe, MD   CC: Pacemaker follow-up  Lindsey Bryant is a 79 y.o. female seen today for Virl Axe, MD for acute visit due to low BP.  Since last being seen in our clinic the patient reports doing OK. She and her husband feel like she is gradually getting weaker. She has had further issues with her BP and has been started on midodrine, which seems to have helped. She had an episodes of systolic BP in the 44I 3/47, but otherwise is running 100-110s. She has chronic gas pains after eating and has GI follow up next week.   Device History: Medtronic Dual Chamber PPM implanted 2013 for CHB  Past Medical History:  Diagnosis Date   Acute systolic heart failure (HCC)    Bradycardia    a. 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   Parkinson's disease    Past Surgical History:  Procedure Laterality Date   APPENDECTOMY  1952   COLONOSCOPY     EYE SURGERY  07/2017   Cataract Sx   LEFT HEART CATH AND CORONARY ANGIOGRAPHY N/A 08/03/2016   Procedure: Left Heart Cath and Coronary Angiography;  Surgeon: Leonie Man, MD;  Location: [place] [facility];  Service: Cardiovascular;  Laterality: N/A;   MELANOMA EXCISION Right 2000   right arm   PACEMAKER PLACEMENT  05/2012   PACEMAKER REVISION N/A 03/14/2012   Procedure: PACEMAKER REVISION;  Surgeon: Thompson Grayer, MD;  Location: [facility] C[facility] LAB;  Service: Cardiovascular;  Laterality: N/A;   PERMANENT PACEMAKER INSERTION N/A 03/06/2012   Procedure: PERMANENT PACEMAKER INSERTION;  Surgeon: Deboraha Sprang, MD;  Location: [facility] CATH LAB;  Service: Cardiovascular;  Laterality: N/A;   SKIN CANCER DESTRUCTION   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    Current Outpatient Medications  Medication Sig Dispense Refill   aspirin EC 81 MG tablet Take 81 mg by mouth at bedtime.     Calcium-Phosphorus-Vitamin D (CITRACAL CALCIUM GUMMIES PO) Take by mouth as directed.     carbidopa-levodopa (SINEMET IR) 25-100 MG per tablet Take 2 tablets by mouth 6 (six) times daily.      Cholecalciferol (VITAMIN D3) 2000 units capsule Take 2,000 Units by mouth daily.     COCONUT OIL PO Take 6 oz by mouth daily.      dicyclomine (BENTYL) 10 MG capsule TAKE 1 CAPSULE BY MOUTH EVERY 6 HOURS AS NEEDED 60 capsule 2   lisinopril (ZESTRIL) 5 MG tablet Take 5 mg by mouth daily.     melatonin 5 MG TABS Take 5 mg by mouth at bedtime.     midodrine (PROAMATINE) 2.5 MG tablet Take 1 tablet (2.5 mg total) by mouth 3 (three) times daily with meals. 270 tablet 1   Omega-3 1000 MG CAPS Take 1 capsule by mouth daily.     polyethylene glycol powder (GLYCOLAX/MIRALAX) 17 GM/SCOOP powder Take 8.5 g by mouth. 5 times a week     Probiotic Product (PROBIOTIC PO) Take 1 capsule by mouth daily.     Propylene Glycol (SYSTANE COMPLETE OP) Apply 1 drop to eye daily.     VITAMIN E PO Take 1 capsule by mouth daily.  Simethicone (GAS-X PO) Take 1 tablet by mouth. After meals and at bedtime     No current facility-administered medications for this visit.    Allergies:   Patient has no known allergies.   Social History: Social History   Socioeconomic History   Marital status: Married    Spouse name: Not on file   Number of children: 2   Years of education: Not on file   Highest education level: Not on file  Occupational History   Not on file  Tobacco Use   Smoking status: Former Smoker   Smokeless tobacco: Never Used   Tobacco comment: 10 yrs in college  Vaping Use   Vaping Use: Never used  Substance and Sexual Activity   Alcohol use: No    Comment: recovering alcoholic   Drug use: No   Sexual activity: Not  on file  Other Topics Concern   Not on file  Social History Narrative   Retired Pharmacist, hospital, married. 2 daughters I think.   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   Social Determinants of Health   Financial Resource Strain: Low Risk    Difficulty of Paying Living Expenses: Not hard at all  Food Insecurity:    Worried About Charity fundraiser in the Last Year: Not on file   [employer] of Food in the Last Year: Not on file  Transportation Needs: No Transportation Needs   Lack of Transportation (Medical): No   Lack of Transportation (Non-Medical): No  Physical Activity:    Days of Exercise per Week: Not on file   Minutes of Exercise per Session: Not on file  Stress:    Feeling of Stress : Not on file  Social Connections:    Frequency of Communication with Friends and Family: Not on file   Frequency of Social Gatherings with Friends and Family: Not on file   Attends Religious Services: Not on file   Active Member of Clubs or Organizations: Not on file   Attends Archivist Meetings: Not on file   Marital Status: Not on file  Intimate Partner Violence:    Fear of Current or Ex-Partner: Not on file   Emotionally Abused: Not on file   Physically Abused: Not on file   Sexually Abused: Not on file    Family History: Family History  Problem Relation Age of Onset   Alcohol abuse Father    Hypothyroidism Daughter    Heart attack Mother 57       Details unclear   Diabetes Brother    Breast cancer Other    Hypothyroidism Daughter    Colon cancer Neg Hx    Esophageal cancer Neg Hx    Stomach cancer Neg Hx    Pancreatic cancer Neg Hx    Liver disease Neg Hx      Review of Systems: All other systems reviewed and are otherwise negative except as noted above.  Physical Exam: Vitals:   02/21/20 1223  BP: 116/72  Pulse: 70  SpO2: 97%  Weight: 104 lb 12.8 oz (47.5 kg)  Height: 5\' 2"  (1.575 m)     GEN- The patient is elderly  appearing, alert and oriented x 3 today.   HEENT: normocephalic, atraumatic; sclera clear, conjunctiva pink; hearing intact; oropharynx clear; neck supple  Lungs- Clear to ausculation bilaterally, normal work of breathing.  No wheezes, rales, rhonchi Heart- Regular rate and rhythm, no murmurs, rubs or gallops  GI- soft, non-tender, non-distended, bowel sounds present  Extremities- no clubbing or cyanosis. No edema MS- no significant deformity or atrophy Skin- warm and dry, no rash or lesion; PPM pocket well healed Psych- euthymic mood, full affect Neuro- strength and sensation are intact  PPM Interrogation- reviewed in detail today,  See PACEART report  EKG:  EKG is ordered today. The ekg ordered today shows AS-VP at 70 bpm (personally reviewed)  Recent Labs: 12/26/2019: ALT 5; BUN 10; Creat 0.78; Hemoglobin 12.8; Platelets 180; Potassium 4.8; Sodium 133; TSH 1.45   Wt Readings from Last 3 Encounters:  02/21/20 104 lb 12.8 oz (47.5 kg)  01/21/20 107 lb (48.5 kg)  01/01/20 108 lb (49 kg)     Other studies Reviewed: Additional studies/ records that were reviewed today include: Previous EP office notes, Previous remote checks, Most recent labwork.   Assessment and Plan:  1. CHB s/p Medtronic PPM  EF normal 12/2017 Normal PPM function See Pace Art report No changes today  2. Orthostatic hypotension/Neurologic hypotension She has been started on midodrine per neurology Unable to tolerate abdominal binder as it aggravated GI symptoms. Can try compression hose/thigh sleeves.   3. NICM Echo 12/2017 showed LVEF 50-55% Continue current medications  4. Post prandial fatigue/Parkinsons Chronic. Follows with GI.   5. Deconditioning Multifactorial, and suspect overall this is progression of her Parkinsons.   Current medicines are reviewed at length with the patient today.   The patient does not have concerns regarding her medicines.  The following changes were made today:   none  Labs/ tests ordered today include:  Orders Placed This Encounter  Procedures   EKG 12-Lead   Disposition:   Follow up with Dr. Caryl Comes in 6 Months    Signed, Shirley Friar, PA-C  02/21/2020 12:50 PM  [address] [place]  [place] [address] [phone] (office) [phone] (fax)

## 2020-02-21 ENCOUNTER — Encounter: Payer: Self-pay | Admitting: Student

## 2020-02-21 ENCOUNTER — Other Ambulatory Visit: Payer: Self-pay

## 2020-02-21 ENCOUNTER — Ambulatory Visit (INDEPENDENT_AMBULATORY_CARE_PROVIDER_SITE_OTHER): Payer: Medicare HMO | Admitting: Student

## 2020-02-21 VITALS — BP 116/72 | HR 70 | Ht 62.0 in | Wt 104.8 lb

## 2020-02-21 DIAGNOSIS — Z950 Presence of cardiac pacemaker: Secondary | ICD-10-CM | POA: Diagnosis not present

## 2020-02-21 DIAGNOSIS — I951 Orthostatic hypotension: Secondary | ICD-10-CM

## 2020-02-21 DIAGNOSIS — I429 Cardiomyopathy, unspecified: Secondary | ICD-10-CM | POA: Diagnosis not present

## 2020-02-21 DIAGNOSIS — I428 Other cardiomyopathies: Secondary | ICD-10-CM

## 2020-02-21 DIAGNOSIS — I442 Atrioventricular block, complete: Secondary | ICD-10-CM | POA: Diagnosis not present

## 2020-02-21 DIAGNOSIS — G20 Parkinson's disease: Secondary | ICD-10-CM

## 2020-02-21 LAB — CUP PACEART INCLINIC DEVICE CHECK
Battery Impedance: 946 Ohm
Battery Remaining Longevity: 62 mo
Battery Voltage: 2.78 V
Brady Statistic AP VP Percent: 18 %
Brady Statistic AP VS Percent: 0 %
Brady Statistic AS VP Percent: 82 %
Brady Statistic AS VS Percent: 0 %
Date Time Interrogation Session: 20210930124840
Implantable Lead Implant Date: 20131014
Implantable Lead Implant Date: 20131014
Implantable Lead Location: 753859
Implantable Lead Location: 753860
Implantable Lead Model: 5076
Implantable Lead Model: 5076
Implantable Pulse Generator Implant Date: 20131014
Lead Channel Impedance Value: 450 Ohm
Lead Channel Impedance Value: 670 Ohm
Lead Channel Pacing Threshold Amplitude: 0.5 V
Lead Channel Pacing Threshold Amplitude: 0.5 V
Lead Channel Pacing Threshold Amplitude: 0.625 V
Lead Channel Pacing Threshold Amplitude: 1 V
Lead Channel Pacing Threshold Pulse Width: 0.4 ms
Lead Channel Pacing Threshold Pulse Width: 0.4 ms
Lead Channel Pacing Threshold Pulse Width: 0.4 ms
Lead Channel Pacing Threshold Pulse Width: 0.4 ms
Lead Channel Sensing Intrinsic Amplitude: 11.2 mV
Lead Channel Sensing Intrinsic Amplitude: 4 mV
Lead Channel Setting Pacing Amplitude: 2 V
Lead Channel Setting Pacing Amplitude: 2.5 V
Lead Channel Setting Pacing Pulse Width: 0.4 ms
Lead Channel Setting Sensing Sensitivity: 4 mV

## 2020-02-21 NOTE — Patient Instructions (Signed)
Medication Instructions:  *If you need a refill on your cardiac medications before your next appointment, please call your pharmacy*  Follow-Up: At [facility], you and your health needs are our priority.  As part of our continuing mission to provide you with exceptional heart care, we have created designated Provider Care Teams.  These Care Teams include your primary Cardiologist (physician) and Advanced Practice Providers (APPs -  Physician Assistants and Nurse Practitioners) who all work together to provide you with the care you need, when you need it.  We recommend signing up for the patient portal called "MyChart".  Sign up information is provided on this After Visit Summary.  MyChart is used to connect with patients for Virtual Visits (Telemedicine).  Patients are able to view lab/test results, encounter notes, upcoming appointments, etc.  Non-urgent messages can be sent to your provider as well.   To learn more about what you can do with MyChart, go to [email or web address].    Your next appointment:   Your physician wants you to follow-up in: 6 MONTHS with Dr. Caryl Comes.  You will receive a reminder letter in the mail two months in advance. If you don't receive a letter, please call our office to schedule the follow-up appointment.  Remote monitoring is used to monitor your Device from home. This monitoring reduces the number of office visits required to check your device to one time per year. It allows [place] to keep an eye on the functioning of your device to ensure it is working properly. You are scheduled for a device check from home on 04/30/20. You may send your transmission at any time that day. If you have a wireless device, the transmission will be sent automatically. After your physician reviews your transmission, you will receive a postcard with your next transmission date.

## 2020-02-27 ENCOUNTER — Encounter: Payer: Self-pay | Admitting: Internal Medicine

## 2020-02-27 ENCOUNTER — Other Ambulatory Visit (INDEPENDENT_AMBULATORY_CARE_PROVIDER_SITE_OTHER): Payer: Medicare HMO

## 2020-02-27 ENCOUNTER — Ambulatory Visit (INDEPENDENT_AMBULATORY_CARE_PROVIDER_SITE_OTHER): Payer: Medicare HMO | Admitting: Internal Medicine

## 2020-02-27 VITALS — BP 120/64 | HR 63 | Ht 62.0 in | Wt 103.0 lb

## 2020-02-27 DIAGNOSIS — R10814 Left lower quadrant abdominal tenderness: Secondary | ICD-10-CM

## 2020-02-27 DIAGNOSIS — R634 Abnormal weight loss: Secondary | ICD-10-CM

## 2020-02-27 DIAGNOSIS — R1032 Left lower quadrant pain: Secondary | ICD-10-CM | POA: Diagnosis not present

## 2020-02-27 DIAGNOSIS — R1031 Right lower quadrant pain: Secondary | ICD-10-CM

## 2020-02-27 LAB — COMPREHENSIVE METABOLIC PANEL
ALT: 2 U/L (ref 0–35)
AST: 15 U/L (ref 0–37)
Albumin: 4.4 g/dL (ref 3.5–5.2)
Alkaline Phosphatase: 48 U/L (ref 39–117)
BUN: 10 mg/dL (ref 6–23)
CO2: 29 mEq/L (ref 19–32)
Calcium: 9.6 mg/dL (ref 8.4–10.5)
Chloride: 97 mEq/L (ref 96–112)
Creatinine, Ser: 0.73 mg/dL (ref 0.40–1.20)
GFR: 78.57 mL/min (ref >60.00)
Glucose, Bld: 90 mg/dL (ref 70–99)
Potassium: 3.8 mEq/L (ref 3.5–5.1)
Sodium: 131 mEq/L — ABNORMAL LOW (ref 135–145)
Total Bilirubin: 0.5 mg/dL (ref 0.2–1.2)
Total Protein: 7 g/dL (ref 6.0–8.3)

## 2020-02-27 LAB — CBC WITH DIFFERENTIAL/PLATELET
Basophils Absolute: 0.1 10*3/uL (ref 0.0–0.1)
Basophils Relative: 0.8 % (ref 0.0–3.0)
Eosinophils Absolute: 0 10*3/uL (ref 0.0–0.7)
Eosinophils Relative: 0.7 % (ref 0.0–5.0)
HCT: 38.2 % (ref 36.0–46.0)
Hemoglobin: 12.9 g/dL (ref 12.0–15.0)
Lymphocytes Relative: 19.8 % (ref 12.0–46.0)
Lymphs Abs: 1.2 10*3/uL (ref 0.7–4.0)
MCHC: 33.8 g/dL (ref 30.0–36.0)
MCV: 100 fl (ref 78.0–100.0)
Monocytes Absolute: 0.6 10*3/uL (ref 0.1–1.0)
Monocytes Relative: 9.1 % (ref 3.0–12.0)
Neutro Abs: 4.2 10*3/uL (ref 1.4–7.7)
Neutrophils Relative %: 69.6 % (ref 43.0–77.0)
Platelets: 138 10*3/uL — ABNORMAL LOW (ref 150.0–400.0)
RBC: 3.82 Mil/uL — ABNORMAL LOW (ref 3.87–5.11)
RDW: 12.7 % (ref 11.5–15.5)
WBC: 6.1 10*3/uL (ref 4.0–10.5)

## 2020-02-27 NOTE — Patient Instructions (Signed)
Your provider has requested that you go to the basement level for lab work before leaving today. Press "B" on the elevator. The lab is located at the first door on the left as you exit the elevator.   You have been scheduled for a CT scan of the abdomen and pelvis at [facility] are scheduled on 03/04/2020 at 7:30AM. You should arrive 15 minutes prior to your appointment time for registration. Please follow the written instructions below on the day of your exam:  WARNING: IF YOU ARE ALLERGIC TO IODINE/X-RAY DYE, PLEASE NOTIFY RADIOLOGY IMMEDIATELY AT [phone]! YOU WILL BE GIVEN A 13 HOUR PREMEDICATION PREP.  1) Do not eat or drink anything after 3:30AM (4 hours prior to your test) 2) You have been given 2 bottles of oral contrast to drink. The solution may taste better if refrigerated, but do NOT add ice or any other liquid to this solution. Shake well before drinking.    Drink 1 bottle of contrast @ 5:30AM (2 hours prior to your exam)  Drink 1 bottle of contrast @ 6:30AM (1 hour prior to your exam)  You may take any medications as prescribed with a small amount of water, if necessary. If you take any of the following medications: METFORMIN, GLUCOPHAGE, GLUCOVANCE, AVANDAMET, RIOMET, FORTAMET, [place] MET, JANUMET, GLUMETZA or METAGLIP, you MAY be asked to HOLD this medication 48 hours AFTER the exam.  The purpose of you drinking the oral contrast is to aid in the visualization of your intestinal tract. The contrast solution may cause some diarrhea. Depending on your individual set of symptoms, you may also receive an intravenous injection of x-ray contrast/dye. Plan on being at [facility] for 30 minutes or longer, depending on the type of exam you are having performed.  This test typically takes 30-45 minutes to complete.  If you have any questions regarding your exam or if you need to reschedule, you may call the CT department at [phone] between the hours of  8:00 am and 5:00 pm, Monday-Friday.  ________________________________________________________________________  I appreciate the opportunity to care for you. Silvano Rusk, MD, [facility]

## 2020-02-27 NOTE — Progress Notes (Signed)
Lindsey Bryant 79 y.o. November 10, 1940 [id number]  Assessment & Plan:   Encounter Diagnoses  Name Primary?  . Loss of weight Yes  . Bilateral lower abdominal pain   . 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  She had a suspected internal hernia when she was hospitalized in 2018 and made a full clinical Recovery.  I think it is worthwhile imaging again with CT scanning.  Labs as below results have returned.  Orders Placed This Encounter  Procedures  . CT Abdomen Pelvis W Contrast  . CBC with Differential/Platelet  . Comprehensive metabolic panel    Lab Results  Component Value Date   WBC 6.1 02/27/2020   HGB 12.9 02/27/2020   HCT 38.2 02/27/2020   MCV 100.0 02/27/2020   PLT 138.0 (L) 02/27/2020   Lab Results  Component Value Date   CREATININE 0.73 02/27/2020   BUN 10 02/27/2020   NA 131 (L) 02/27/2020   K 3.8 02/27/2020   CL 97 02/27/2020   CO2 29 02/27/2020   Lab Results  Component Value Date   ALT 2 02/27/2020   AST 15 02/27/2020   ALKPHOS 48 02/27/2020   BILITOT 0.5 02/27/2020    I appreciate the opportunity to care for this patient. 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  She is having problems with weight loss and lower abdominal pain with a constant aching.  There is a lower abdominal pressure that bothers her, not severe but it is there and disrupt quality of life.  Dicyclomine will help some but makes her sleepy.  That is a problem.  She went to physical therapy for pelvic floor help last year and improved but did not participate more than 3 sessions due to fears of Covid.  She says her stools are generally like pudding and she moves her bowels most days but some days only a  little bit.  She has been struggling with some hypotension issues as well.  Parkinson's disease is slowly progressing.  Appetite is good.  Weights as below.  She had been hospitalized in March 2018 with inflammatory changes in the small bowel loops, with a little bit of pelvic fluid cause was not clear.  Thought she had a severe enteritis bowel ischemia was in the differential though no significant stenoses noted.  She had a regular CT with contrast 07/29/2016 and had a CT enterography as well due to persistent symptoms 3 days later.  She was treated conservatively with antibiotics and she improved and her problems resolved and we never did any imaging follow-up.  It was thought that she probably had an obstructed internal hernia that resolved.   IMPRESSION: 1. Proximal small bowel has become more distended in the interval measuring 3.8 cm in diameter. Interloop mesenteric fluid persists but does appear to have decreased in the interval. There is more intraperitoneal free fluid at this time. The distal small bowel wall thickening seen on the previous study also appears improved. 2. On today's exam, 2 discrete small bowel transitions zones are identified in the right peritoneal cavity. Given the presence of 2 transition zones, closed loop obstruction and internal hernia remain distinct considerations. 3. Interval development of small bilateral pleural effusions and bibasilar  atelectasis.  Wt Readings from Last 3 Encounters:  02/27/20 103 lb (46.7 kg)  02/21/20 104 lb 12.8 oz (47.5 kg)  01/21/20 107 lb (48.5 kg)   Taking midodrine to help blood pressure.  She notes an itchy scalp since starting that. No Known Allergies Current Meds  Medication Sig  . aspirin EC 81 MG tablet Take 81 mg by mouth at bedtime.  . Calcium-Phosphorus-Vitamin D (CITRACAL CALCIUM GUMMIES PO) Take by mouth as directed.  . carbidopa-levodopa (SINEMET IR) 25-100 MG per tablet Take 2 tablets by mouth 6 (six) times daily.    . Cholecalciferol (VITAMIN D3) 2000 units capsule Take 2,000 Units by mouth daily.  . COCONUT OIL PO Take 6 oz by mouth daily.   Marland Kitchen dicyclomine (BENTYL) 10 MG capsule TAKE 1 CAPSULE BY MOUTH EVERY 6 HOURS AS NEEDED  . melatonin 5 MG TABS Take 5 mg by mouth at bedtime.  . midodrine (PROAMATINE) 2.5 MG tablet Take 1 tablet (2.5 mg total) by mouth 3 (three) times daily with meals.  . Omega-3 1000 MG CAPS Take 1 capsule by mouth daily.  . polyethylene glycol powder (GLYCOLAX/MIRALAX) 17 GM/SCOOP powder Take 8.5 g by mouth. 5 times a week  . Probiotic Product (PROBIOTIC PO) Take 1 capsule by mouth daily.  Marland Kitchen Propylene Glycol (SYSTANE COMPLETE OP) Apply 1 drop to eye daily.  . Simethicone (GAS-X PO) Take 1 tablet by mouth. After meals and at bedtime  . VITAMIN E PO Take 1 capsule by mouth daily.   Past Medical History:  Diagnosis Date  . Acute systolic heart failure ([place])   . Bradycardia    a. 02/2012  . Hyperglycemia   . IBS (irritable bowel syndrome)   . Melanoma ([place])    right arm  . NSTEMI (non-ST elevated myocardial infarction) ([place])   . 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 Parkinson's disease    Past Surgical History:  Procedure Laterality Date  . APPENDECTOMY  1952  . COLONOSCOPY    . EYE SURGERY  07/2017   Cataract Sx  . LEFT HEART CATH AND CORONARY ANGIOGRAPHY N/A 08/03/2016   Procedure: Left Heart Cath and Coronary Angiography;  Surgeon: Leonie Man, MD;  Location: [place] [facility];  Service: Cardiovascular;  Laterality: N/A;  . MELANOMA EXCISION Right 2000   right arm  . PACEMAKER PLACEMENT  05/2012  . PACEMAKER REVISION N/A 03/14/2012   Procedure: PACEMAKER REVISION;  Surgeon: Thompson Grayer, MD;  Location: [facility] CATH LAB;  Service: Cardiovascular;  Laterality: N/A;  . PERMANENT PACEMAKER INSERTION N/A 03/06/2012   Procedure: PERMANENT PACEMAKER INSERTION;  Surgeon: Deboraha Sprang, MD;  Location: [facility] CATH LAB;  Service: Cardiovascular;  Laterality: N/A;  . SKIN CANCER  DESTRUCTION  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 2 daughters I think.   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   family history includes Alcohol abuse in her father; Breast cancer in an other family member; Diabetes in her brother; Heart attack (age of onset: 71) in her mother; Hypothyroidism in her daughter and daughter.   Review of Systems As per HPI  Objective:   Physical Exam BP 120/64   Pulse 63   Ht 5\' 2"  (1.575 m)   Wt 103 lb (46.7 kg)   BMI 18.84 kg/m   Petite elderly white woman no acute distress Mild masked facies Abdominal exam with bowel sounds present I do not hear any  bruits, she had mild left lower quadrant tenderness soft exam without masses hernia organomegaly She is alert and oriented x3 Ambulation is not particularly slow she seems to be fairly mobile able to get onto the exam table without difficulty by herself

## 2020-03-03 ENCOUNTER — Other Ambulatory Visit: Payer: Self-pay | Admitting: Internal Medicine

## 2020-03-04 ENCOUNTER — Ambulatory Visit (HOSPITAL_COMMUNITY)
Admission: RE | Admit: 2020-03-04 | Discharge: 2020-03-04 | Disposition: A | Discharge status: Home / Self Care | Payer: Medicare HMO | Source: Ambulatory Visit | Attending: Internal Medicine | Admitting: Internal Medicine

## 2020-03-04 ENCOUNTER — Encounter (HOSPITAL_COMMUNITY): Payer: Self-pay

## 2020-03-04 ENCOUNTER — Other Ambulatory Visit: Payer: Self-pay

## 2020-03-04 DIAGNOSIS — R1032 Left lower quadrant pain: Secondary | ICD-10-CM | POA: Diagnosis present

## 2020-03-04 DIAGNOSIS — R634 Abnormal weight loss: Secondary | ICD-10-CM | POA: Diagnosis present

## 2020-03-04 DIAGNOSIS — K5900 Constipation, unspecified: Secondary | ICD-10-CM | POA: Diagnosis not present

## 2020-03-04 DIAGNOSIS — R1031 Right lower quadrant pain: Secondary | ICD-10-CM | POA: Diagnosis present

## 2020-03-04 DIAGNOSIS — R10814 Left lower quadrant abdominal tenderness: Secondary | ICD-10-CM | POA: Diagnosis present

## 2020-03-04 MED ORDER — IOHEXOL 300 MG/ML  SOLN
100.0000 mL | Freq: Once | INTRAMUSCULAR | Status: AC | PRN
  Administered 2020-03-04: 100 mL via INTRAVENOUS

## 2020-03-06 ENCOUNTER — Telehealth: Payer: Self-pay | Admitting: Internal Medicine

## 2020-03-10 NOTE — Telephone Encounter (Signed)
Patient notified that the kit has been placed out front for her to come pick up. She verbalized understanding of instructions.

## 2020-03-10 NOTE — Telephone Encounter (Signed)
Pt is requesting a call back from [facility]

## 2020-03-10 NOTE — Telephone Encounter (Signed)
Order sent and kit placed for patient to pick up.  Left message for patient to call back

## 2020-03-11 ENCOUNTER — Telehealth: Payer: Self-pay | Admitting: Internal Medicine

## 2020-03-11 NOTE — Telephone Encounter (Signed)
Lindsey Bryant from [facility] is requesting a call back to clarify something on a form she received, on the top half it is very blurry.  CB [phone]

## 2020-03-12 NOTE — Telephone Encounter (Signed)
Spoke with [facility].  Questions answered.

## 2020-03-18 DIAGNOSIS — R142 Eructation: Secondary | ICD-10-CM | POA: Diagnosis not present

## 2020-03-27 ENCOUNTER — Ambulatory Visit: Payer: Medicare HMO | Admitting: Internal Medicine

## 2020-03-27 ENCOUNTER — Ambulatory Visit (INDEPENDENT_AMBULATORY_CARE_PROVIDER_SITE_OTHER): Payer: Medicare HMO | Admitting: Internal Medicine

## 2020-03-27 ENCOUNTER — Encounter: Payer: Self-pay | Admitting: Internal Medicine

## 2020-03-27 VITALS — BP 112/64 | HR 72 | Ht 62.0 in | Wt 105.0 lb

## 2020-03-27 DIAGNOSIS — R634 Abnormal weight loss: Secondary | ICD-10-CM | POA: Diagnosis not present

## 2020-03-27 DIAGNOSIS — K581 Irritable bowel syndrome with constipation: Secondary | ICD-10-CM

## 2020-03-27 NOTE — Progress Notes (Signed)
Lindsey Bryant 79 y.o. 10-21-1940 [id number]  Assessment & Plan:   Encounter Diagnoses  Name Primary?  . Irritable bowel syndrome with constipation Yes  . Loss of weight     She will continue her current treatment.  Dicyclomine is effective.  She inquired about adding back diazepam which was used in the past because she had been on Librium for many years.  She is tolerating the dicyclomine using about 40 mg maximum a day sometimes 20 mg at a time.  She will continue this.  She and her husband are aware that technically we are told not to use this in people over 65 if we can help it but this provides symptomatic relief when she has a long history of taking anticholinergics for many years well beyond the age of 20.  She will see me again in December although may cancel that if she is doing well.  CC: Lindsey Post, MD  Subjective:   Chief Complaint: IBS follow-up  HPI Lindsey Bryant is here with her husband for follow-up of IBS issues and weight loss.  She has gained a pound she is eating a bit better and feeling a little bit stronger though some days are better than others.  She did a lactulose hydrogen breath test and wanted to review the results.  It is not consistent with small intestinal bacterial overgrowth Wt Readings from Last 3 Encounters:  03/27/20 105 lb (47.6 kg)  02/27/20 103 lb (46.7 kg)  02/21/20 104 lb 12.8 oz (47.5 kg)    She does report the dicyclomine is quite effective to help with her abdominal cramping related to defecation and at other times.  She will take 10 to 20 mg every 6 hours as needed a maximum of 40 mg daily it sounds like. No Known Allergies Current Meds  Medication Sig  . aspirin EC 81 MG tablet Take 81 mg by mouth at bedtime.  . Calcium-Phosphorus-Vitamin D (CITRACAL CALCIUM GUMMIES PO) Take by mouth as directed.  . carbidopa-levodopa (SINEMET IR) 25-100 MG per tablet Take 2 tablets by mouth 6 (six) times daily.   . Cholecalciferol (VITAMIN D3)  2000 units capsule Take 2,000 Units by mouth daily.  . COCONUT OIL PO Take 6 oz by mouth daily.   Marland Kitchen dicyclomine (BENTYL) 10 MG capsule TAKE 1 CAPSULE BY MOUTH EVERY 6 HOURS AS NEEDED (Patient taking differently: Patient states she is taking 2 tablets at night and may take 2 tablets during the day if she has spasms)  . melatonin 5 MG TABS Take 5 mg by mouth at bedtime.  . midodrine (PROAMATINE) 2.5 MG tablet Take 1 tablet (2.5 mg total) by mouth 3 (three) times daily with meals.  . Omega-3 1000 MG CAPS Take 1 capsule by mouth daily.  . polyethylene glycol powder (GLYCOLAX/MIRALAX) 17 GM/SCOOP powder Take 8.5 g by mouth. 5 times a week  . Probiotic Product (PROBIOTIC PO) Take 1 capsule by mouth daily.  Marland Kitchen Propylene Glycol (SYSTANE COMPLETE OP) Apply 1 drop to eye daily.  . Simethicone (GAS-X PO) Take 1 tablet by mouth. After meals and at bedtime  . VITAMIN E PO Take 1 capsule by mouth daily.   Past Medical History:  Diagnosis Date  . Acute systolic heart failure ([place])   . Bradycardia    a. 02/2012  . Hyperglycemia   . IBS (irritable bowel syndrome)   . Melanoma ([place])    right arm  . NSTEMI (non-ST elevated myocardial infarction) ([place])   .  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 Parkinson's disease    Past Surgical History:  Procedure Laterality Date  . APPENDECTOMY  1952  . COLONOSCOPY    . EYE SURGERY  07/2017   Cataract Sx  . LEFT HEART CATH AND CORONARY ANGIOGRAPHY N/A 08/03/2016   Procedure: Left Heart Cath and Coronary Angiography;  Surgeon: Leonie Man, MD;  Location: [place] CV LAB;  Service: Cardiovascular;  Laterality: N/A;  . MELANOMA EXCISION Right 2000   right arm  . PACEMAKER PLACEMENT  05/2012  . PACEMAKER REVISION N/A 03/14/2012   Procedure: PACEMAKER REVISION;  Surgeon: Thompson Grayer, MD;  Location: [facility] CATH LAB;  Service: Cardiovascular;  Laterality: N/A;  . PERMANENT PACEMAKER INSERTION N/A 03/06/2012   Procedure: PERMANENT PACEMAKER INSERTION;  Surgeon: Deboraha Sprang, MD;  Location: [facility] CATH LAB;  Service: Cardiovascular;  Laterality: N/A;  . 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 2 daughters I think.   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   family history includes Alcohol abuse in her father; Breast cancer in an other family member; Diabetes in her brother; Heart attack (age of onset: 64) in her mother; Hypothyroidism in her daughter and daughter.   Review of Systems As above  Objective:   Physical Exam BP 112/64   Pulse 72   Ht 5\' 2"  (1.575 m)   Wt 105 lb (47.6 kg)   SpO2 99%   BMI 19.20 kg/m   Total time 21 minutes

## 2020-03-27 NOTE — Patient Instructions (Signed)
  Please come back in December, if all better you may cancel the appointment.   I appreciate the opportunity to care for you. Silvano Rusk, MD, [facility]

## 2020-04-17 ENCOUNTER — Other Ambulatory Visit: Payer: Self-pay | Admitting: Internal Medicine

## 2020-04-22 ENCOUNTER — Other Ambulatory Visit: Payer: Self-pay

## 2020-04-22 ENCOUNTER — Encounter: Payer: Self-pay | Admitting: Family Medicine

## 2020-04-22 ENCOUNTER — Ambulatory Visit (INDEPENDENT_AMBULATORY_CARE_PROVIDER_SITE_OTHER): Payer: Medicare HMO | Admitting: Family Medicine

## 2020-04-22 VITALS — BP 110/65 | HR 76 | Ht 62.0 in | Wt 108.0 lb

## 2020-04-22 DIAGNOSIS — R634 Abnormal weight loss: Secondary | ICD-10-CM | POA: Diagnosis not present

## 2020-04-22 DIAGNOSIS — L299 Pruritus, unspecified: Secondary | ICD-10-CM

## 2020-04-22 DIAGNOSIS — E871 Hypo-osmolality and hyponatremia: Secondary | ICD-10-CM | POA: Diagnosis not present

## 2020-04-22 DIAGNOSIS — I951 Orthostatic hypotension: Secondary | ICD-10-CM | POA: Diagnosis not present

## 2020-04-22 NOTE — Patient Instructions (Signed)

## 2020-04-22 NOTE — Progress Notes (Signed)
Established Patient Office Visit  Subjective:  Patient ID: Lindsey Bryant, female    DOB: [birth year]  Age: 79 y.o. MRN: [id number]  CC:  Chief Complaint  Patient presents with   Follow-up    HPI [name] presents for follow-up visit from August.  She had been losing weight and she feels like she is finally stabilized at this time.  By her home scales her weight is up a couple pounds.  She also has significant orthostasis with standing blood pressure of 84 systolic.  She was started on midodrine 2.5 mg which she is currently taking twice daily per neurology.  This seems to help somewhat.  She tries to change positions slowly.  She is conscious to stay hydrated well.  She has had some scalp pruritus and wonders if this may be related to the midodrine.  She had recent labs.  She had questions regarding sodium 131.  She does not take any thiazides.  She has liberalized her sodium intake somewhat.  Denies any recent falls.  Appetite has improved slowly.  She has fairly severe IBS symptoms intermittently.  She is taking MiraLAX fairly regularly at night.  She takes dicyclomine as needed for pain episodes  Past Medical History:  Diagnosis Date   Acute systolic heart failure ([facility])    Bradycardia    a. 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   Parkinson's disease     Past Surgical History:  Procedure Laterality Date   APPENDECTOMY  1952   COLONOSCOPY     EYE SURGERY  07/2017   Cataract Sx   LEFT HEART CATH AND CORONARY ANGIOGRAPHY N/A 08/03/2016   Procedure: Left Heart Cath and Coronary Angiography;  Surgeon: Leonie Man, MD;  Location: [place] CV LAB;  Service: Cardiovascular;  Laterality: N/A;   MELANOMA EXCISION Right 2000   right arm   PACEMAKER PLACEMENT  05/2012   PACEMAKER REVISION N/A 03/14/2012   Procedure:  PACEMAKER REVISION;  Surgeon: Thompson Grayer, MD;  Location: [facility] CATH LAB;  Service: Cardiovascular;  Laterality: N/A;   PERMANENT PACEMAKER INSERTION N/A 03/06/2012   Procedure: PERMANENT PACEMAKER INSERTION;  Surgeon: Deboraha Sprang, MD;  Location: [facility] CATH LAB;  Service: Cardiovascular;  Laterality: N/A;   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    Family History  Problem Relation Age of Onset   Alcohol abuse Father    Hypothyroidism Daughter    Heart attack Mother 34       Details unclear   Diabetes Brother    Breast cancer Other    Hypothyroidism Daughter    Colon cancer Neg Hx    Esophageal cancer Neg Hx    Stomach cancer Neg Hx    Pancreatic cancer Neg Hx    Liver disease Neg Hx     Social History   Socioeconomic History   Marital status: Married    Spouse name: Not on file   Number of children: 2   Years of education: Not on file   Highest education level: Not on file  Occupational History   Not on file  Tobacco Use   Smoking status: Former Smoker   Smokeless tobacco: Never Used   Tobacco comment: 10 yrs in college  Vaping Use   Vaping Use: Never used  Substance and  Sexual Activity   Alcohol use: No    Comment: recovering alcoholic   Drug use: No   Sexual activity: Not on file  Other Topics Concern   Not on file  Social History Narrative   Retired Pharmacist, hospital, married. 2 daughters I think.   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   Social Determinants of Health   Financial Resource Strain: Low Risk    Difficulty of Paying Living Expenses: Not hard at all  Food Insecurity:    Worried About Charity fundraiser in the Last Year: Not on file   [employer] of Food in the Last Year: Not on file  Transportation Needs: No Transportation Needs   Lack of Transportation (Medical): No   Lack of Transportation (Non-Medical): No  Physical Activity:    Days of Exercise per Week: Not on file   Minutes of Exercise  per Session: Not on file  Stress:    Feeling of Stress : Not on file  Social Connections:    Frequency of Communication with Friends and Family: Not on file   Frequency of Social Gatherings with Friends and Family: Not on file   Attends Religious Services: Not on file   Active Member of Clubs or Organizations: Not on file   Attends Archivist Meetings: Not on file   Marital Status: Not on file  Intimate Partner Violence:    Fear of Current or Ex-Partner: Not on file   Emotionally Abused: Not on file   Physically Abused: Not on file   Sexually Abused: Not on file    Outpatient Medications Prior to Visit  Medication Sig Dispense Refill   aspirin EC 81 MG tablet Take 81 mg by mouth at bedtime.     Calcium-Phosphorus-Vitamin D (CITRACAL CALCIUM GUMMIES PO) Take by mouth as directed.     carbidopa-levodopa (SINEMET IR) 25-100 MG per tablet Take 2 tablets by mouth 6 (six) times daily.      Cholecalciferol (VITAMIN D3) 2000 units capsule Take 2,000 Units by mouth daily.     COCONUT OIL PO Take 6 oz by mouth daily.      dicyclomine (BENTYL) 10 MG capsule TAKE 1 CAPSULE BY MOUTH EVERY 6 HOURS AS NEEDED 60 capsule 2   melatonin 5 MG TABS Take 5 mg by mouth at bedtime.     midodrine (PROAMATINE) 2.5 MG tablet Take 1 tablet (2.5 mg total) by mouth 3 (three) times daily with meals. 270 tablet 1   Omega-3 1000 MG CAPS Take 1 capsule by mouth daily.     polyethylene glycol powder (GLYCOLAX/MIRALAX) 17 GM/SCOOP powder Take 8.5 g by mouth. 5 times a week     Probiotic Product (PROBIOTIC PO) Take 1 capsule by mouth daily.     Propylene Glycol (SYSTANE COMPLETE OP) Apply 1 drop to eye daily.     Simethicone (GAS-X PO) Take 1 tablet by mouth. After meals and at bedtime     VITAMIN E PO Take 1 capsule by mouth daily.     No facility-administered medications prior to visit.    No Known Allergies  ROS Review of Systems  Constitutional: Negative for chills, fever  and unexpected weight change.  Respiratory: Negative for shortness of breath.   Cardiovascular: Negative for chest pain.  Gastrointestinal: Negative for nausea and vomiting.      Objective:    Physical Exam Vitals reviewed.  Constitutional:      Appearance: Normal appearance.  Cardiovascular:     Rate  and Rhythm: Normal rate and regular rhythm.  Pulmonary:     Effort: Pulmonary effort is normal.     Breath sounds: Normal breath sounds.  Abdominal:     Palpations: Abdomen is soft.     Tenderness: There is no abdominal tenderness.  Musculoskeletal:     Right lower leg: No edema.     Left lower leg: No edema.  Skin:    Comments: She has limited dryness and mild erythema involving the posterior parietal and occipital scalp.  No specific rashes.  Neurological:     Mental Status: She is alert.     BP 110/65    Pulse 76    Ht 5\' 2"  (1.575 m)    Wt 108 lb (49 kg)    BMI 19.75 kg/m  Wt Readings from Last 3 Encounters:  04/22/20 108 lb (49 kg)  03/27/20 105 lb (47.6 kg)  02/27/20 103 lb (46.7 kg)     Health Maintenance Due  Topic Date Due   Hepatitis C Screening  Never done   PNA vac Low Risk Adult (2 of 2 - PPSV23) 07/18/2015   INFLUENZA VACCINE  12/23/2019    There are no preventive care reminders to display for this patient.  Lab Results  Component Value Date   TSH 1.45 12/26/2019   Lab Results  Component Value Date   WBC 6.1 02/27/2020   HGB 12.9 02/27/2020   HCT 38.2 02/27/2020   MCV 100.0 02/27/2020   PLT 138.0 (L) 02/27/2020   Lab Results  Component Value Date   NA 131 (L) 02/27/2020   K 3.8 02/27/2020   CO2 29 02/27/2020   GLUCOSE 90 02/27/2020   BUN 10 02/27/2020   CREATININE 0.73 02/27/2020   BILITOT 0.5 02/27/2020   ALKPHOS 48 02/27/2020   AST 15 02/27/2020   ALT 2 02/27/2020   PROT 7.0 02/27/2020   ALBUMIN 4.4 02/27/2020   CALCIUM 9.6 02/27/2020   ANIONGAP 8 08/04/2016   GFR 78.57 02/27/2020   Lab Results  Component Value Date    CHOL 194 12/26/2019   Lab Results  Component Value Date   HDL 84 12/26/2019   Lab Results  Component Value Date   LDLCALC 91 12/26/2019   Lab Results  Component Value Date   TRIG 94 12/26/2019   Lab Results  Component Value Date   CHOLHDL 2.3 12/26/2019   Lab Results  Component Value Date   HGBA1C 5.3 07/29/2016      Assessment & Plan:   #1 recent hypotension with orthostatic changes.  Blood pressure today seated 130/68 left arm and standing 110/60 which is much improved from last visit  -Continue low-dose midodrine as per neurology  #2 history of recent weight loss.  Her labs were reassuring.  This seems to have stabilized and has gone back up a little bit  #3 pruritic scalp.  Etiology unclear.  Consider shampoo with conditioner.  If symptoms persist consider medicated shampoo such as Capex  #4 mild hyponatremia.  No thiazide use. -Reassured and less this is dropping sodium 131 not clinically significant.  No orders of the defined types were placed in this encounter.   Follow-up: Return in about 6 months (around 10/20/2020).    [name], MD

## 2020-04-30 ENCOUNTER — Ambulatory Visit (INDEPENDENT_AMBULATORY_CARE_PROVIDER_SITE_OTHER): Payer: Medicare HMO

## 2020-04-30 DIAGNOSIS — I442 Atrioventricular block, complete: Secondary | ICD-10-CM

## 2020-05-03 ENCOUNTER — Other Ambulatory Visit: Payer: Self-pay | Admitting: Internal Medicine

## 2020-05-03 LAB — CUP PACEART REMOTE DEVICE CHECK
Battery Impedance: 1023 Ohm
Battery Remaining Longevity: 58 mo
Battery Voltage: 2.78 V
Brady Statistic AP VP Percent: 17 %
Brady Statistic AP VS Percent: 0 %
Brady Statistic AS VP Percent: 83 %
Brady Statistic AS VS Percent: 0 %
Date Time Interrogation Session: 20211210144728
Implantable Lead Implant Date: 20131014
Implantable Lead Implant Date: 20131014
Implantable Lead Location: 753859
Implantable Lead Location: 753860
Implantable Lead Model: 5076
Implantable Lead Model: 5076
Implantable Pulse Generator Implant Date: 20131014
Lead Channel Impedance Value: 432 Ohm
Lead Channel Impedance Value: 647 Ohm
Lead Channel Pacing Threshold Amplitude: 0.375 V
Lead Channel Pacing Threshold Amplitude: 0.5 V
Lead Channel Pacing Threshold Pulse Width: 0.4 ms
Lead Channel Pacing Threshold Pulse Width: 0.4 ms
Lead Channel Setting Pacing Amplitude: 2 V
Lead Channel Setting Pacing Amplitude: 2.5 V
Lead Channel Setting Pacing Pulse Width: 0.4 ms
Lead Channel Setting Sensing Sensitivity: 4 mV

## 2020-05-06 ENCOUNTER — Ambulatory Visit (INDEPENDENT_AMBULATORY_CARE_PROVIDER_SITE_OTHER): Payer: Medicare HMO | Admitting: Internal Medicine

## 2020-05-06 ENCOUNTER — Encounter: Payer: Self-pay | Admitting: Internal Medicine

## 2020-05-06 VITALS — BP 126/80 | HR 80 | Ht 61.5 in | Wt 108.4 lb

## 2020-05-06 DIAGNOSIS — K581 Irritable bowel syndrome with constipation: Secondary | ICD-10-CM

## 2020-05-06 DIAGNOSIS — R634 Abnormal weight loss: Secondary | ICD-10-CM | POA: Diagnosis not present

## 2020-05-06 DIAGNOSIS — R61 Generalized hyperhidrosis: Secondary | ICD-10-CM | POA: Diagnosis not present

## 2020-05-06 NOTE — Patient Instructions (Signed)
Glad your doing better. Come back and see [place] as needed.  If your night sweats get worse please go back and see Dr Elease Hashimoto.  I appreciate the opportunity to care for you. Silvano Rusk, MD, [facility]

## 2020-05-06 NOTE — Progress Notes (Signed)
Lindsey Bryant 79 y.o. 01-09-1941 [id number]  Assessment & Plan:   Encounter Diagnoses  Name Primary?  . Irritable bowel syndrome with constipation Yes  . Loss of weight   . Night sweats     Overall she is better.  Weight is up a bit and stable.  She seems brighter and has more energy.  Continue with MiraLAX and occasional dicyclomine.  See me as needed.  Keep up the water aerobics perhaps that is part of the issue and may be she is finally adapted to the midodrine as well.  I appreciate the opportunity to care for this patient. CC: Lindsey Post, MD    Subjective:   Chief Complaint: Follow-up of abdominal pain and IBS, also night sweats new complaint  HPI [name] is here with her husband Lindsey Bryant, she has a history of IBS with constipation in the setting of Parkinson's disease, she had been losing some weight and been struggling with bowel habits and abdominal discomfort.  She reports that she is improved, when she defecates in the morning she feels much better.  She went back to water aerobics a few weeks ago.  She has more energy and feels better.  She does have a problem with night sweats for a year or so, they are a little more frequent perhaps twice a month or 3 times a month where they were a month apart before.  Its not associated with starting the midodrine which was started 3 months ago, she does not tend to have to change her clothes.  She has chills at times as well.  Overall however she is better.  She and her husband wonder if the night sweats are related to many years of taking estrogens though she stopped those about 15 years ago.  She saw Dr. Elease Hashimoto in late November.  She was complaining of some scalp itchiness then that she attributed to the midodrine.  That seems to be a little less of a problem now.  Wt Readings from Last 3 Encounters:  05/06/20 108 lb 6 oz (49.2 kg)  04/22/20 108 lb (49 kg)  03/27/20 105 lb (47.6 kg)   CT abd and pelvis  03/04/2020  CLINICAL DATA:  79 year old female with history of left lower quadrant abdominal pain, weight loss and tenderness. Diarrhea and constipation.  EXAM: CT ABDOMEN AND PELVIS WITH CONTRAST  TECHNIQUE: Multidetector CT imaging of the abdomen and pelvis was performed using the standard protocol following bolus administration of intravenous contrast.  CONTRAST:  15mL OMNIPAQUE IOHEXOL 300 MG/ML  SOLN  COMPARISON:  CT the abdomen and pelvis 08/01/2016.  FINDINGS: Lower chest: Pacemaker lead terminating in the right ventricular apex. Aortic atherosclerosis.  Hepatobiliary: Subcentimeter low-attenuation lesion in the periphery of segment 4A of the liver, too small to characterize, but statistically likely to represent a cyst. No other suspicious appearing hepatic lesions. No intra or extrahepatic biliary ductal dilatation. Gallbladder is normal in appearance.  Pancreas: No pancreatic mass. No pancreatic ductal dilatation. No pancreatic or peripancreatic fluid collections or inflammatory changes.  Spleen: Unremarkable.  Adrenals/Urinary Tract: Bilateral kidneys and adrenal glands are normal in appearance. No hydroureteronephrosis. Urinary bladder is normal in appearance.  Stomach/Bowel: Normal appearance of the stomach. No pathologic dilatation of small bowel or colon. The appendix is not confidently identified and may be surgically absent. Regardless, there are no inflammatory changes noted adjacent to the cecum to suggest the presence of an acute appendicitis at this time.  Vascular/Lymphatic: Aortic atherosclerosis, without evidence of aneurysm  or dissection in the abdominal or pelvic vasculature. No lymphadenopathy noted in the abdomen or pelvis.  Reproductive: Small coarsely calcified lesion in the posterior aspect of the uterine fundus, presumably a tiny fibroid. Ovaries are atrophic.  Other: No significant volume of ascites.  No  pneumoperitoneum.  Musculoskeletal: There are no aggressive appearing lytic or blastic lesions noted in the visualized portions of the skeleton.  IMPRESSION: 1. No acute findings are noted in the abdomen or pelvis to account for the patient's symptoms. 2. Aortic atherosclerosis. 3. Additional incidental findings, as above.   Electronically Signed   By: Vinnie Langton M.D.   On: 03/04/2020 09:05    Lab Results  Component Value Date   WBC 6.1 02/27/2020   HGB 12.9 02/27/2020   HCT 38.2 02/27/2020   MCV 100.0 02/27/2020   PLT 138.0 (L) 02/27/2020    No Known Allergies Current Meds  Medication Sig  . aspirin EC 81 MG tablet Take 81 mg by mouth at bedtime.  . Calcium-Phosphorus-Vitamin D (CITRACAL CALCIUM GUMMIES PO) Take by mouth as directed.  . carbidopa-levodopa (SINEMET IR) 25-100 MG per tablet Take 2 tablets by mouth 6 (six) times daily.   . Cholecalciferol (VITAMIN D3) 2000 units capsule Take 2,000 Units by mouth daily.  . COCONUT OIL PO Take 6 oz by mouth daily.   Marland Kitchen dicyclomine (BENTYL) 10 MG capsule TAKE 1 CAPSULE BY MOUTH EVERY 6 HOURS AS NEEDED  . melatonin 5 MG TABS Take 5 mg by mouth at bedtime.  . midodrine (PROAMATINE) 2.5 MG tablet Take 1 tablet (2.5 mg total) by mouth 3 (three) times daily with meals.  . Omega-3 1000 MG CAPS Take 1 capsule by mouth daily.  . polyethylene glycol powder (GLYCOLAX/MIRALAX) 17 GM/SCOOP powder Take 8.5 g by mouth. 5 times a week  . Probiotic Product (PROBIOTIC PO) Take 1 capsule by mouth daily.  Marland Kitchen Propylene Glycol (SYSTANE COMPLETE OP) Apply 1 drop to eye daily.  . Simethicone (GAS-X PO) Take 1 tablet by mouth. After meals and at bedtime  . VITAMIN E PO Take 1 capsule by mouth daily.   Past Medical History:  Diagnosis Date  . Acute systolic heart failure ([place])   . Bradycardia    a. 02/2012  . Hyperglycemia   . IBS (irritable bowel syndrome)   . Melanoma ([place])    right arm  . NSTEMI (non-ST elevated myocardial  infarction) ([place])   . 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 Parkinson's disease    Past Surgical History:  Procedure Laterality Date  . APPENDECTOMY  1952  . COLONOSCOPY    . EYE SURGERY  07/2017   Cataract Sx  . LEFT HEART CATH AND CORONARY ANGIOGRAPHY N/A 08/03/2016   Procedure: Left Heart Cath and Coronary Angiography;  Surgeon: Leonie Man, MD;  Location: [facility];  Service: Cardiovascular;  Laterality: N/A;  . MELANOMA EXCISION Right 2000   right arm  . PACEMAKER PLACEMENT  05/2012  . PACEMAKER REVISION N/A 03/14/2012   Procedure: PACEMAKER REVISION;  Surgeon: Thompson Grayer, MD;  Location[facility] LAB;  Service: Cardiovascular;  Laterality: N/A;  . PERMANENT PACEMAKER INSERTION N/A 03/06/2012   Procedure: PERMANENT PACEMAKER INSERTION;  Surgeon: Deboraha Sprang, MD;  Location: [facility] CATH LAB;  Service: Cardiovascular;  Laterality: N/A;  . 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 2 daughters I think.   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   family history includes Alcohol abuse in her father; Breast cancer in an other family member; Diabetes in her brother; Heart attack (age of onset: 87) in her mother; Hypothyroidism in her daughter and daughter.   Review of Systems As per HPI  Objective:   Physical Exam BP 126/80 (BP Location: Left Arm, Patient Position: Sitting, Cuff Size: Normal)   Pulse 80   Ht 5' 1.5" (1.562 m)   Wt 108 lb 6 oz (49.2 kg)   BMI 20.15 kg/m    Data reviewed include primary care note prior GI notes

## 2020-05-09 DIAGNOSIS — H5202 Hypermetropia, left eye: Secondary | ICD-10-CM | POA: Diagnosis not present

## 2020-05-09 DIAGNOSIS — H5211 Myopia, right eye: Secondary | ICD-10-CM | POA: Diagnosis not present

## 2020-05-09 DIAGNOSIS — Z961 Presence of intraocular lens: Secondary | ICD-10-CM | POA: Diagnosis not present

## 2020-05-09 DIAGNOSIS — H04123 Dry eye syndrome of bilateral lacrimal glands: Secondary | ICD-10-CM | POA: Diagnosis not present

## 2020-05-13 ENCOUNTER — Telehealth: Payer: Self-pay | Admitting: Pharmacist

## 2020-05-13 NOTE — Progress Notes (Signed)
Remote pacemaker transmission.   

## 2020-05-13 NOTE — Progress Notes (Signed)
    Chronic Care Management Pharmacy Assistant   Name: Lindsey Bryant  MRN: [id number] DOB: Apr 24, 1941  Reason for Encounter: medication Adherence Call   PCP : [name], MD  Allergies:  No Known Allergies  Medications: Outpatient Encounter Medications as of 05/13/2020  Medication Sig  . aspirin EC 81 MG tablet Take 81 mg by mouth at bedtime.  . Calcium-Phosphorus-Vitamin D (CITRACAL CALCIUM GUMMIES PO) Take by mouth as directed.  . carbidopa-levodopa (SINEMET IR) 25-100 MG per tablet Take 2 tablets by mouth 6 (six) times daily.   . Cholecalciferol (VITAMIN D3) 2000 units capsule Take 2,000 Units by mouth daily.  . COCONUT OIL PO Take 6 oz by mouth daily.   Marland Kitchen dicyclomine (BENTYL) 10 MG capsule TAKE 1 CAPSULE BY MOUTH EVERY 6 HOURS AS NEEDED  . melatonin 5 MG TABS Take 5 mg by mouth at bedtime.  . midodrine (PROAMATINE) 2.5 MG tablet Take 1 tablet (2.5 mg total) by mouth 3 (three) times daily with meals.  . Omega-3 1000 MG CAPS Take 1 capsule by mouth daily.  . polyethylene glycol powder (GLYCOLAX/MIRALAX) 17 GM/SCOOP powder Take 8.5 g by mouth. 5 times a week  . Probiotic Product (PROBIOTIC PO) Take 1 capsule by mouth daily.  Marland Kitchen Propylene Glycol (SYSTANE COMPLETE OP) Apply 1 drop to eye daily.  . Simethicone (GAS-X PO) Take 1 tablet by mouth. After meals and at bedtime  . VITAMIN E PO Take 1 capsule by mouth daily.   No facility-administered encounter medications on file as of 05/13/2020.    Current Diagnosis: Patient Active Problem List   Diagnosis Date Noted  . 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 Obstructed internal hernia   . Elevated TSH 07/29/2016  . NSTEMI (non-ST elevated myocardial infarction) ([place])   . 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 MENOPAUSAL SYNDROME 09/17/2009  . 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 Parkinson's disease ([place]) 11/21/2006  . Irritable bowel syndrome 11/21/2006    Goals  Addressed   None     Follow-Up:  Pharmacist Review     The patient was on the medication adherence list for lisinopril 5mg . Preformed a chart review the patient does not take Lisinopril. Called the patient to confirm there was no answer left a VM for a return call.   Rosendo Gros, [facility]  Practice Team Manager/ CPA (Clinical Pharmacist Assistant) [phone]

## 2020-05-20 ENCOUNTER — Other Ambulatory Visit: Payer: Self-pay | Admitting: Internal Medicine

## 2020-05-29 ENCOUNTER — Other Ambulatory Visit: Payer: Self-pay | Admitting: Internal Medicine

## 2020-06-06 ENCOUNTER — Ambulatory Visit: Payer: Medicare HMO | Admitting: Cardiovascular Disease

## 2020-06-11 NOTE — Progress Notes (Signed)
Virtual Visit Via Video   The purpose of this virtual visit is to provide medical care while limiting exposure to the novel coronavirus.    Consent was obtained for video visit:  Yes.   Answered questions that patient had about telehealth interaction:  Yes.   I discussed the limitations, risks, security and privacy concerns of performing an evaluation and management service by telemedicine. I also discussed with the patient that there may be a patient responsible charge related to this service. The patient expressed understanding and agreed to proceed.  Pt location: Home Physician Location: office Name of referring provider:  [name], MD I connected with Lindsey Bryant at patients initiation/request on 06/13/2020 at  3:00 PM EST by video enabled telemedicine application and verified that I am speaking with the correct person using two identifiers. Pt MRN:  [id number] Pt DOB:  Apr 30, 1941 Video Participants:  Lindsey Bryant; husband supplements history.  Assessment/Plan:   1.  Parkinsons Disease, sx's since 2006             -Patient previously with Dr. Isidor Holts and [facility] movement disorders.             -Patient will continue carbidopa/levodopa 25/100, 2 tablets 6 times per day  -We discussed that it used to be thought that levodopa would increase risk of melanoma but now it is believed that Parkinsons itself likely increases risk of melanoma. she is to get regular skin checks.  -Refer to speech therapy.   2.  Neurogenic Orthostatic Hypotension             -Increase midodrine, 5 mg twice per day.  She felt that taking it more than twice a day because scalp itching, so I am going to at least increase the dosage that she takes twice per day.  3.  Abdominal pain, with history of IBS and constipation/diarrhea             -Patient following with Dr. [name].             -Copy of rancho recipe given.  -Patient on dicyclomine from Dr. [name].  Subjective   Patient seen  today in follow-up for Parkinson's disease.  Outside records that were made available to me were reviewed.  Pt denies falls.  We started patient on midodrine last visit for Neurogenic Orthostatic Hypotension and she reports that "its really helping a lot." did well in December but noting last few weeks its been low - 98/59 today and 94/54 the other day.  She has a few readings in the 120's and improves as the day goes on.  She gets lightheaded and weak in the AM.  Admits that she only takes the medication twice per day because she thinks that it causes her scalp to itch.  No hallucinations.  Mood has been good.  She has been following with Dr. [name] for her irritable bowel.  She last saw him on December 14.   Current movement d/o meds:  Carbidopa/levodopa 25/100, 2 tablets 6 times per day (6:30am, 9:30am, 12:30pm, 3:30, 6:30, 10:30pm)  Midodrine, 2.5 mg 3 times per day started last visit Current Outpatient Medications on File Prior to Visit  Medication Sig Dispense Refill   aspirin EC 81 MG tablet Take 81 mg by mouth at bedtime.     Calcium-Phosphorus-Vitamin D (CITRACAL CALCIUM GUMMIES PO) Take by mouth as directed.     carbidopa-levodopa (SINEMET IR) 25-100 MG per tablet Take 2 tablets by mouth  6 (six) times daily.      Cholecalciferol (VITAMIN D3) 2000 units capsule Take 2,000 Units by mouth daily.     COCONUT OIL PO Take 6 oz by mouth daily.      dicyclomine (BENTYL) 10 MG capsule TAKE 1 CAPSULE BY MOUTH EVERY 6 HOURS AS NEEDED 60 capsule 2   melatonin 5 MG TABS Take 5 mg by mouth at bedtime.     Omega-3 1000 MG CAPS Take 1 capsule by mouth daily.     polyethylene glycol powder (GLYCOLAX/MIRALAX) 17 GM/SCOOP powder Take 8.5 g by mouth. 5 times a week     Probiotic Product (PROBIOTIC PO) Take 1 capsule by mouth daily.     Propylene Glycol (SYSTANE COMPLETE OP) Apply 1 drop to eye daily.     Simethicone (GAS-X PO) Take 1 tablet by mouth. After meals and at bedtime     VITAMIN E  PO Take 1 capsule by mouth daily.     No current facility-administered medications on file prior to visit.     Objective   Vitals:   06/13/20 1514  BP: (!) 98/59   GEN:  The patient appears stated age and is in NAD.  Neurological examination:  Orientation: The patient is alert and oriented x3. Cranial nerves: There is good facial symmetry. There is minimal facial hypomimia.  The speech is fluent and somewhat hypophonic and raspy. Soft palate rises symmetrically and there is no tongue deviation. Hearing is intact to conversational tone. Motor: Strength is at least antigravity x 4.   Shoulder shrug is equal and symmetric.  There is no pronator drift.  Movement examination: Tone: unable Abnormal movements: Mildly dyskinetic Coordination:  There is minimal decremation with RAM's, with finger taps bilaterally Gait and Station: The patient arises well and easily ambulates within her home.    Follow up Instructions      -I discussed the assessment and treatment plan with the patient. The patient was provided an opportunity to ask questions and all were answered. The patient agreed with the plan and demonstrated an understanding of the instructions.   The patient was advised to call back or seek an in-person evaluation if the symptoms worsen or if the condition fails to improve as anticipated.    Total time spent on today's visit was 69minutes, including both face-to-face time and nonface-to-face time.  Time included that spent on review of records (prior notes available to me/labs/imaging if pertinent), discussing treatment and goals, answering patient's questions and coordinating care.   Alonza Bogus, DO

## 2020-06-13 ENCOUNTER — Telehealth (INDEPENDENT_AMBULATORY_CARE_PROVIDER_SITE_OTHER): Payer: Medicare HMO | Admitting: Neurology

## 2020-06-13 ENCOUNTER — Other Ambulatory Visit: Payer: Self-pay

## 2020-06-13 ENCOUNTER — Encounter: Payer: Self-pay | Admitting: Neurology

## 2020-06-13 VITALS — BP 98/59

## 2020-06-13 DIAGNOSIS — G20 Parkinson's disease: Secondary | ICD-10-CM

## 2020-06-13 DIAGNOSIS — G903 Multi-system degeneration of the autonomic nervous system: Secondary | ICD-10-CM

## 2020-06-13 MED ORDER — MIDODRINE HCL 5 MG PO TABS: 5.0000 mg | ORAL_TABLET | Freq: Two times a day (BID) | ORAL | 1 refills | Status: DC

## 2020-06-13 NOTE — Patient Instructions (Signed)
You have been referred to [facility] for therapy. They will call you directly to schedule an appointment.  Please call [phone] if you do not hear from them.   

## 2020-07-10 ENCOUNTER — Telehealth: Payer: Self-pay | Admitting: Family Medicine

## 2020-07-10 NOTE — Telephone Encounter (Signed)
Left message for patient to call back and schedule Medicare Annual Wellness Visit (AWV) either virtually or in office. No detailed message left    Last AWVI please schedule at anytime with [facility] Nurse Health Advisor 1 or 2   This should be a 45 minute visit. 

## 2020-07-14 ENCOUNTER — Ambulatory Visit: Payer: Medicare HMO | Attending: Neurology | Admitting: Speech Pathology

## 2020-07-14 ENCOUNTER — Encounter: Payer: Self-pay | Admitting: Speech Pathology

## 2020-07-14 ENCOUNTER — Other Ambulatory Visit: Payer: Self-pay

## 2020-07-14 DIAGNOSIS — R498 Other voice and resonance disorders: Secondary | ICD-10-CM | POA: Insufficient documentation

## 2020-07-14 NOTE — Patient Instructions (Signed)
THINK LOUD!   I'll see you next session.

## 2020-07-14 NOTE — Therapy (Signed)
[address] [place], [place], [address] Phone: [phone]   Fax:  [phone]  Speech Language Pathology Evaluation  Patient Details  Name: Lindsey Bryant MRN: [id number] Date of Birth: 10-06-1940 Referring Provider (SLP): Alonza Bogus DO   Encounter Date: 07/14/2020   End of Session - 07/14/20 1618    Visit Number 1    Number of Visits 9    Date for SLP Re-Evaluation 08/11/20    SLP Start Time 1407    SLP Stop Time  1440    SLP Time Calculation (min) 33 min    Activity Tolerance Patient tolerated treatment well           Past Medical History:  Diagnosis Date  . Acute systolic heart failure ([place])   . Bradycardia    a. 02/2012  . Hyperglycemia   . IBS (irritable bowel syndrome)   . Melanoma ([place])    right arm  . NSTEMI (non-ST elevated myocardial infarction) ([place])   . 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 Parkinson's disease     Past Surgical History:  Procedure Laterality Date  . APPENDECTOMY  1952  . COLONOSCOPY    . EYE SURGERY  07/2017   Cataract Sx  . LEFT HEART CATH AND CORONARY ANGIOGRAPHY N/A 08/03/2016   Procedure: Left Heart Cath and Coronary Angiography;  Surgeon: Leonie Man, MD;  Location: [facility];  Service: Cardiovascular;  Laterality: N/A;  . MELANOMA EXCISION Right 2000   right arm  . PACEMAKER PLACEMENT  05/2012  . PACEMAKER REVISION N/A 03/14/2012   Procedure: PACEMAKER REVISION;  Surgeon: Thompson Grayer, MD;  Location: [facility] CATH LAB;  Service: Cardiovascular;  Laterality: N/A;  . PERMANENT PACEMAKER INSERTION N/A 03/06/2012   Procedure: PERMANENT PACEMAKER INSERTION;  Surgeon: Deboraha Sprang, MD;  Location: [facility] C[facility] LAB;  Service: Cardiovascular;  Laterality: N/A;  . 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   Subjective Assessment - 07/14/20 1548    Subjective " I am worried about my voice."    Currently  in Pain? No/denies              SLP Evaluation [facility] - 07/14/20 1548      SLP Visit Information   SLP Received On 07/14/20    Referring Provider (SLP) Alonza Bogus DO    Onset Date Referred: 06/13/20    Medical Diagnosis Parkinson's Disease      Subjective   Patient/Family Stated Goal Voice      General Information   HPI Pt is a 80 yo female with hx significant for chronic conditions including: IBS- C, NSTEMI, NICM, hx of acute systolic heart failure, Parkinson's Disease. Pt is currently taking le    Mobility Status Ambulatory      Balance Screen   Has the patient fallen in the past 6 months No    Has the patient had a decrease in activity level because of a fear of falling?  No    Is the patient reluctant to leave their home because of a fear of falling?  No      Prior Functional Status   Type of Home House     Lives With Spouse    Vocation Retired      Retail buyer   Respiration Impaired    Level of Impairment Conversation    Phonation Breathy;Low vocal intensity  Resonance Within functional limits    Articulation Within functional limitis    Intelligibility Intelligible    Motor Planning Witnin functional limits    Motor Speech Errors Not applicable    Effective Techniques Increased vocal intensity;Pacing    Phonation Impaired    Volume Soft    Pitch Appropriate      Standardized Assessments   Standardized Assessments  Other Assessment   CAPE-V   Other Assessment Voice-Related Quality of Life (V-RQOL) Measure; CAPE-V, Informal measures          Voice-Related Quality of Life (V-RQOL) Measure   1 = None, not a problem 2 = A small amount 3 = A moderate (medium) amount 4 = A lot 5 = Problem is as "bad as it can be"  Because of my voice,         How much of a problem is this?  1.  I have trouble speaking loudly or being heard in noisy situations.     1     2     3     4     5   2. I run out of air and need to take frequent breaths when talking.     1      2     3     4     5   3. I sometimes do not know what will come out when I begin speaking.    1     2     3     4     5   4. I am sometimes anxious or frustrated because of my voice.      1     2     3     4     5   5. I sometimes get depressed because of my voice.       1     2     3     4     5   6. I have trouble using the telephone because of my voice.      1     2     3     4     5   7. I have trouble doing my job or practicing my profession because of my voice.   1     2     3     4     5   8. I avoid going out socially because of my voice.       1     2     3     4     5   9. I have to repeat myself to be understood.                   1     2     3     4     5   10. I have become less outgoing because of my voice.                  1     2     3     4     5    Total Raw Score:   21  Calculated Score:  72.5       >80 is WNL    Voice Evaluation Habitual loudness: 69 dB Habitual pitch: 230 Hz Maximum Phonation Time (MPT): 10  sec average  Loudness average: 70 dB  CAPE-V:  Increased breathiness, decreased loudness Overall severity: Moderately Deviant                SLP Education - 07/14/20 1617    Education Details PD and voice related changes    Person(s) Educated Patient    Methods Explanation    Comprehension Verbalized understanding            SLP Short Term Goals - 07/14/20 1641      SLP SHORT TERM GOAL #1   Title Patient will complete vocal adduction exercises with visual/verbal feedback/model.    Time 2    Period Weeks    Status New            SLP Long Term Goals - 07/14/20 1639      SLP LONG TERM GOAL #1   Title Patient will demonstrate improved vocal quality/loudness using vocal strategies in conversation.    Time 4    Period Weeks    Status New            Plan - 07/14/20 1634    Clinical Impression Statement Pt is a 79 yo female who presents today with voice concerns 2/2 to Parkinson's Disease. Pt reports increased breathiness and  decreased vocalizations; worse in the AM. No changes in swallowing or cognition reported this date, but does report extreme fatigue. See full evaluation for results of VQOL, CAPE-V, and informal voice measures. Subjectively, pt demonstrates reduced intensity (vocal loudness) and increased breathiness. SLP provided edu on PD related voice changes and the need for evaluation and treatment. SLP rec skilled speech services to address vocal intensity to increase intelligibility and QOL.    Speech Therapy Frequency 2x / week    Duration 4 weeks    Treatment/Interventions Functional tasks;Cueing hierarchy;Patient/family education;Environmental controls;Compensatory techniques;Internal/external aids;SLP instruction and feedback;Compensatory strategies    Potential to Achieve Goals Good    Potential Considerations Medical prognosis    SLP Home Exercise Plan THINK LOUD    Consulted and Agree with Plan of Care Patient           Patient will benefit from skilled therapeutic intervention in order to improve the following deficits and impairments:   Other voice and resonance disorders    Problem List Patient Active Problem List   Diagnosis Date Noted  . 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 Obstructed internal hernia   . Elevated TSH 07/29/2016  . NSTEMI (non-ST elevated myocardial infarction) ([place])   . 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 MENOPAUSAL SYNDROME 09/17/2009  . 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 Parkinson's disease ([place]) 11/21/2006  . Irritable bowel syndrome 11/21/2006    Verdene Lennert, MS [place], CBIS 07/14/2020, 4:41 PM  [address] [place], [place], [address] Phone: [phone]   Fax:  [phone]  Name: Lindsey Bryant MRN: [id number] Date of Birth: [birth year]

## 2020-07-20 ENCOUNTER — Other Ambulatory Visit: Payer: Self-pay | Admitting: Internal Medicine

## 2020-07-21 ENCOUNTER — Other Ambulatory Visit: Payer: Self-pay

## 2020-07-21 ENCOUNTER — Ambulatory Visit: Payer: Medicare HMO | Admitting: Speech Pathology

## 2020-07-21 ENCOUNTER — Encounter: Payer: Self-pay | Admitting: Speech Pathology

## 2020-07-21 DIAGNOSIS — R498 Other voice and resonance disorders: Secondary | ICD-10-CM | POA: Diagnosis not present

## 2020-07-21 NOTE — Therapy (Signed)
[place]. [place], [place], [address] Phone: [phone]   Fax:  [phone]  Speech Language Pathology Treatment  Patient Details  Name: Lindsey Bryant MRN: [id number] Date of Birth: [birth year] Referring Provider (SLP): Alonza Bogus DO   Encounter Date: 07/21/2020   End of Session - 07/21/20 1532    Visit Number 2    Number of Visits 9    Date for SLP Re-Evaluation 08/11/20    SLP Start Time 7035    SLP Stop Time  1532    SLP Time Calculation (min) 45 min    Activity Tolerance Patient tolerated treatment well           Past Medical History:  Diagnosis Date  . Acute systolic heart failure ([place])   . Bradycardia    a. 02/2012  . Hyperglycemia   . IBS (irritable bowel syndrome)   . Melanoma ([place])    right arm  . NSTEMI (non-ST elevated myocardial infarction) ([place])   . 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 Parkinson's disease     Past Surgical History:  Procedure Laterality Date  . APPENDECTOMY  1952  . COLONOSCOPY    . EYE SURGERY  07/2017   Cataract Sx  . LEFT HEART CATH AND CORONARY ANGIOGRAPHY N/A 08/03/2016   Procedure: Left Heart Cath and Coronary Angiography;  Surgeon: Leonie Man, MD;  Location: [facility];  Service: Cardiovascular;  Laterality: N/A;  . MELANOMA EXCISION Right 2000   right arm  . PACEMAKER PLACEMENT  05/2012  . PACEMAKER REVISION N/A 03/14/2012   Procedure: PACEMAKER REVISION;  Surgeon: Thompson Grayer, MD;  Location: [facility] CATH LAB;  Service: Cardiovascular;  Laterality: N/A;  . PERMANENT PACEMAKER INSERTION N/A 03/06/2012   Procedure: PERMANENT PACEMAKER INSERTION;  Surgeon: Deboraha Sprang, MD;  Location: [facility] CATH LAB;  Service: Cardiovascular;  Laterality: N/A;  . 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          ADULT SLP TREATMENT - 07/21/20 1530      General Information   Behavior/Cognition  Alert;Cooperative      Treatment Provided   Treatment provided Cognitive-Linquistic      Cognitive-Linquistic Treatment   Treatment focused on Voice    Skilled Treatment Phonatory Strengthening Exercisesdemonstrated and provided. Pt demonstrated under standing. Completed each exercise x3. Pt reports tension in the voice and neck - exercises next week.      Assessment / Recommendations / Plan   Plan Continue with current plan of care      Progression Toward Goals   Progression toward goals Progressing toward goals              SLP Short Term Goals - 07/21/20 1518      SLP SHORT TERM GOAL #1   Title Patient will complete vocal adduction exercises with visual/verbal feedback/model.    Time 1    Period Weeks    Status New            SLP Long Term Goals - 07/21/20 1518      SLP LONG TERM GOAL #1   Title Patient will demonstrate improved vocal quality/loudness using vocal strategies in conversation.    Time 3    Period Weeks    Status On-going            Plan - 07/21/20 1534    Clinical Impression  Statement Pt is a 80 yo female who presents today with voice concerns 2/2 to Parkinson's Disease. Pt reports increased breathiness and decreased vocalizations; worse in the AM. No changes in swallowing or cognition reported this date, but does report extreme fatigue. See full evaluation for results of VQOL, CAPE-V, and informal voice measures. Subjectively, pt demonstrates reduced intensity (vocal loudness) and increased breathiness. SLP provided edu on PD related voice changes and the need for evaluation and treatment. SLP rec skilled speech services to address vocal intensity to increase intelligibility and QOL. Pt would like to lower sessions to x1/week.    Speech Therapy Frequency 2x / week    Duration 4 weeks    Treatment/Interventions Functional tasks;Cueing hierarchy;Patient/family education;Environmental controls;Compensatory techniques;Internal/external aids;SLP  instruction and feedback;Compensatory strategies    Potential to Achieve Goals Good    Potential Considerations Medical prognosis    SLP Home Exercise Plan THINK LOUD    Consulted and Agree with Plan of Care Patient           Patient will benefit from skilled therapeutic intervention in order to improve the following deficits and impairments:   Other voice and resonance disorders    Problem List Patient Active Problem List   Diagnosis Date Noted  . 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 Obstructed internal hernia   . Elevated TSH 07/29/2016  . NSTEMI (non-ST elevated myocardial infarction) ([place])   . 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 MENOPAUSAL SYNDROME 09/17/2009  . 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 Parkinson's disease ([place]) 11/21/2006  . Irritable bowel syndrome 11/21/2006    Rosann Auerbach [place] MS, [place], CBIS  07/21/2020, 3:34 PM  [place]. [place], [place], [address] Phone: [phone]   Fax:  [phone]   Name: Lindsey Bryant MRN: [id number] Date of Birth: 1940/10/15

## 2020-07-21 NOTE — Patient Instructions (Signed)
Phonatory Strengthening Exercises - complete.

## 2020-07-25 ENCOUNTER — Telehealth: Payer: Self-pay

## 2020-07-25 NOTE — Telephone Encounter (Signed)
Pts. Husband called into the office stating the pt. Has had difficulty sleeping last night and has been up and down until 12 today . He stated the pt has had limited PO intake and has been disoriented, and has had difficulty finishing sentences. He stated this went on for at least 1 hour along with headache and stomach ache. Pt was sent to nurse triage where the recommendation was to go to the ER. The pts. Husband denied the ED and stated he would like for her to come into the office. I spoke with Dr. Elease Hashimoto about the incident and his recommendation was to also go to the ED.

## 2020-07-28 ENCOUNTER — Ambulatory Visit: Payer: Medicare HMO | Admitting: Speech Pathology

## 2020-07-30 ENCOUNTER — Ambulatory Visit (INDEPENDENT_AMBULATORY_CARE_PROVIDER_SITE_OTHER): Payer: Medicare HMO

## 2020-07-30 DIAGNOSIS — I442 Atrioventricular block, complete: Secondary | ICD-10-CM | POA: Diagnosis not present

## 2020-08-01 LAB — CUP PACEART REMOTE DEVICE CHECK
Battery Impedance: 1129 Ohm
Battery Remaining Longevity: 55 mo
Battery Voltage: 2.77 V
Brady Statistic AP VP Percent: 21 %
Brady Statistic AP VS Percent: 0 %
Brady Statistic AS VP Percent: 79 %
Brady Statistic AS VS Percent: 0 %
Date Time Interrogation Session: 20220310090124
Implantable Lead Implant Date: 20131014
Implantable Lead Implant Date: 20131014
Implantable Lead Location: 753859
Implantable Lead Location: 753860
Implantable Lead Model: 5076
Implantable Lead Model: 5076
Implantable Pulse Generator Implant Date: 20131014
Lead Channel Impedance Value: 438 Ohm
Lead Channel Impedance Value: 655 Ohm
Lead Channel Pacing Threshold Amplitude: 0.5 V
Lead Channel Pacing Threshold Amplitude: 0.625 V
Lead Channel Pacing Threshold Pulse Width: 0.4 ms
Lead Channel Pacing Threshold Pulse Width: 0.4 ms
Lead Channel Setting Pacing Amplitude: 2 V
Lead Channel Setting Pacing Amplitude: 2.5 V
Lead Channel Setting Pacing Pulse Width: 0.4 ms
Lead Channel Setting Sensing Sensitivity: 4 mV

## 2020-08-04 ENCOUNTER — Ambulatory Visit: Payer: Medicare HMO | Admitting: Speech Pathology

## 2020-08-04 ENCOUNTER — Other Ambulatory Visit: Payer: Self-pay | Admitting: Internal Medicine

## 2020-08-07 NOTE — Progress Notes (Signed)
Remote pacemaker transmission.   

## 2020-08-11 ENCOUNTER — Ambulatory Visit: Payer: Medicare HMO | Admitting: Speech Pathology

## 2020-08-18 ENCOUNTER — Encounter: Payer: Self-pay | Admitting: Speech Pathology

## 2020-08-18 ENCOUNTER — Ambulatory Visit: Payer: Medicare HMO | Attending: Neurology | Admitting: Speech Pathology

## 2020-08-18 ENCOUNTER — Other Ambulatory Visit: Payer: Self-pay

## 2020-08-18 DIAGNOSIS — R498 Other voice and resonance disorders: Secondary | ICD-10-CM | POA: Diagnosis not present

## 2020-08-18 NOTE — Addendum Note (Signed)
Addended by: Verdene Lennert on: 08/18/2020 03:19 PM   Modules accepted: Orders

## 2020-08-18 NOTE — Therapy (Addendum)
[place]. [place], [place], [address] Phone: [phone]   Fax:  [phone]  Speech Language Pathology Treatment and Recertification  Patient Details  Name: Lindsey Bryant MRN: [id number] Date of Birth: 10/24/1940 Referring Provider (SLP): Alonza Bogus DO   Encounter Date: 08/18/2020   End of Session - 08/18/20 1443    Visit Number 3    Number of Visits 9    Date for SLP Re-Evaluation 08/11/20    SLP Start Time 1400    SLP Stop Time  1445    SLP Time Calculation (min) 45 min    Activity Tolerance Patient tolerated treatment well           Past Medical History:  Diagnosis Date  . Acute systolic heart failure ([place])   . Bradycardia    a. 02/2012  . Hyperglycemia   . IBS (irritable bowel syndrome)   . Melanoma ([place])    right arm  . NSTEMI (non-ST elevated myocardial infarction) ([place])   . 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 Parkinson's disease     Past Surgical History:  Procedure Laterality Date  . APPENDECTOMY  1952  . COLONOSCOPY    . EYE SURGERY  07/2017   Cataract Sx  . LEFT HEART CATH AND CORONARY ANGIOGRAPHY N/A 08/03/2016   Procedure: Left Heart Cath and Coronary Angiography;  Surgeon: Leonie Man, MD;  Location: [facility];  Service: Cardiovascular;  Laterality: N/A;  . MELANOMA EXCISION Right 2000   right arm  . PACEMAKER PLACEMENT  05/2012  . PACEMAKER REVISION N/A 03/14/2012   Procedure: PACEMAKER REVISION;  Surgeon: Thompson Grayer, MD;  Location: [facility] CATH LAB;  Service: Cardiovascular;  Laterality: N/A;  . PERMANENT PACEMAKER INSERTION N/A 03/06/2012   Procedure: PERMANENT PACEMAKER INSERTION;  Surgeon: Deboraha Sprang, MD;  Location: [facility] CATH LAB;  Service: Cardiovascular;  Laterality: N/A;  . 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   Subjective Assessment - 08/18/20 1411    Subjective Spoke about LSVT. Pt to  think about it.    Currently in Pain? No/denies                 ADULT SLP TREATMENT - 08/18/20 1424      General Information   Behavior/Cognition Alert;Cooperative      Treatment Provided   Treatment provided Cognitive-Linquistic      Cognitive-Linquistic Treatment   Treatment focused on Voice    Skilled Treatment Neck exercises to reduce tension. Pt provided with handout. Addressed SOVTE with examples and provided handout. Spoke with patient about beginning LSVT in 2 weeks. Patient reported she would get back with me depending on availability.      Assessment / Recommendations / Plan   Plan Continue with current plan of care      Progression Toward Goals   Progression toward goals Progressing toward goals              SLP Short Term Goals - 08/18/20 1444      SLP SHORT TERM GOAL #1   Title Patient will complete vocal adduction exercises with visual/verbal feedback/model.    Time 1    Period Weeks    Status New            SLP Long Term Goals - 08/18/20 1444      SLP LONG TERM GOAL #1   Title  Patient will demonstrate improved vocal quality/loudness using vocal strategies in conversation.    Time 3    Period Weeks    Status On-going            Plan - 08/18/20 1444    Clinical Impression Statement Pt is a 80 yo female who presents today with voice concerns 2/2 to Parkinson's Disease. Pt reports increased breathiness and decreased vocalizations; worse in the AM. No changes in swallowing or cognition reported this date, but does report extreme fatigue. See full evaluation for results of VQOL, CAPE-V, and informal voice measures. Subjectively, pt demonstrates reduced intensity (vocal loudness) and increased breathiness. SLP provided edu on PD related voice changes and the need for evaluation and treatment. SLP rec skilled speech services to address vocal intensity to increase intelligibility and QOL. Pt would like to lower sessions to x1/week.    Speech Therapy  Frequency 2x / week    Duration 4 weeks    Treatment/Interventions Functional tasks;Cueing hierarchy;Patient/family education;Environmental controls;Compensatory techniques;Internal/external aids;SLP instruction and feedback;Compensatory strategies    Potential to Achieve Goals Good    Potential Considerations Medical prognosis    SLP Home Exercise Plan THINK LOUD    Consulted and Agree with Plan of Care Patient           Patient will benefit from skilled therapeutic intervention in order to improve the following deficits and impairments:   Other voice and resonance disorders    Problem List Patient Active Problem List   Diagnosis Date Noted  . 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 Obstructed internal hernia   . Elevated TSH 07/29/2016  . NSTEMI (non-ST elevated myocardial infarction) ([place])   . 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 MENOPAUSAL SYNDROME 09/17/2009  . 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 Parkinson's disease ([place]) 11/21/2006  . Irritable bowel syndrome 11/21/2006   SPEECH THERAPY RE-CERTIFICATION SUMMARY  Visits from Start of Care: 3  Current functional level related to goals / functional outcomes: Pt continues to demonstrate decreased vocal quality and loudness compared to the start of treatment. Pt reported extenuating circumstances keeping her from treatment over the past month. Pt demonstrates pitch breaks and breathiness in conversation. She is stimulable for a loud, natural quality voice with /a/.    Remaining deficits: Decreased vocal loudness and quality; increased tension   Recommendations: SLP rec LSVT treatment begin. Pt to speak with family to determine if it is feasible to complete x4/week for 4 weeks.   [place], [place], CBIS  08/18/2020, 2:46 PM  [place]. [place], [place], [address] Phone: [phone]    Fax:  [phone]   Name: Lindsey Bryant MRN: [id number] Date of Birth: 1940-08-25

## 2020-08-18 NOTE — Patient Instructions (Signed)
  Semi-occluded vocal tract exercises (SOVTE)  These allow your vocal folds to vibrate without excess tension and promotes high placement of the voice  Use SOVTE as a warm up before prolonged speaking and vocal exercises   High resistance: voicing through a stirring straw  Medium resistance: voicing through a drinking straw  Less resistance: Voiced /v/                            Lip or Tongue Trill                            Nasal "hums" /m/ and /n/                            Vowels /u/ and ee  Watch Vocal Straw Exercises with Ingo Titze on YouTube:  https://www.youtube.com/watch?v=[email or web address]  Pitch Glides for 2 minutes  Accents (siren)  Hum the National Anthem  A goal would be 2-3 minutes several times a day and prior to vocal exercises  As always, use good belly breathing while completing SOVTE  

## 2020-08-25 DIAGNOSIS — I428 Other cardiomyopathies: Secondary | ICD-10-CM | POA: Insufficient documentation

## 2020-08-26 ENCOUNTER — Other Ambulatory Visit: Payer: Self-pay

## 2020-08-26 ENCOUNTER — Ambulatory Visit (INDEPENDENT_AMBULATORY_CARE_PROVIDER_SITE_OTHER): Payer: Medicare HMO | Admitting: Internal Medicine

## 2020-08-26 ENCOUNTER — Encounter: Payer: Self-pay | Admitting: Internal Medicine

## 2020-08-26 VITALS — BP 122/70 | HR 60 | Ht 61.5 in | Wt 106.0 lb

## 2020-08-26 DIAGNOSIS — Z950 Presence of cardiac pacemaker: Secondary | ICD-10-CM

## 2020-08-26 DIAGNOSIS — I428 Other cardiomyopathies: Secondary | ICD-10-CM

## 2020-08-26 DIAGNOSIS — I442 Atrioventricular block, complete: Secondary | ICD-10-CM | POA: Diagnosis not present

## 2020-08-26 MED ORDER — MIDODRINE HCL 5 MG PO TABS: 5.0000 mg | ORAL_TABLET | Freq: Three times a day (TID) | ORAL | 1 refills | Status: DC

## 2020-08-26 NOTE — Patient Instructions (Signed)
Medication Instructions:  Your physician has recommended you make the following change in your medication:   Increase your Proamatine 5mg  - 1 tablet by mouth 3 times daily.  7am, 11am and 3pm  *If you need a refill on your cardiac medications before your next appointment, please call your pharmacy*   Lab Work: None ordered.  If you have labs (blood work) drawn today and your tests are completely normal, you will receive your results only by: Marland Kitchen MyChart Message (if you have MyChart) OR . A paper copy in the mail If you have any lab test that is abnormal or we need to change your treatment, we will call you to review the results.   Testing/Procedures: None ordered.    Follow-Up: At [facility], you and your health needs are our priority.  As part of our continuing mission to provide you with exceptional heart care, we have created designated Provider Care Teams.  These Care Teams include your primary Cardiologist (physician) and Advanced Practice Providers (APPs -  Physician Assistants and Nurse Practitioners) who all work together to provide you with the care you need, when you need it.  We recommend signing up for the patient portal called "MyChart".  Sign up information is provided on this After Visit Summary.  MyChart is used to connect with patients for Virtual Visits (Telemedicine).  Patients are able to view lab/test results, encounter notes, upcoming appointments, etc.  Non-urgent messages can be sent to your provider as well.   To learn more about what you can do with MyChart, go to [email or web address].    Your next appointment:   12 month(s)  The format for your next appointment:   In Person  Provider:   Virl Axe, MD

## 2020-08-26 NOTE — Progress Notes (Signed)
Patient Care Team: [name], MD as PCP - General (Family Medicine) Deboraha Sprang, MD as PCP - Cardiology (Cardiology) Deboraha Sprang, MD as PCP - Electrophysiology (Cardiology) Sherlyn Lees, MD as Referring Physician (Neurology) Earnie Larsson, [facility] as Pharmacist (Pharmacist)   HPI  Lindsey Bryant is a 80 y.o. 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     Major issues include Parkinson's as an affecting her GI tract.  She is also had problems with orthostatic lightheadedness.  Blood pressures have been recorded as high as 200, but none recently.  At this juncture most of her blood pressures are in the 120 range. No chest pain no peripheral edema. Lightheadedness and weakness particularly in the morning.  She has already raised her bed 8 inches.  She takes her Sinemet in the morning.  2 glasses of water.  Showers in the afternoon and has some fear of falling.  Exercises as much as she can   DATE TEST EF   5/14 Echo  55 %   3/18 Cath  Non Obstructive CAs  3/18 Echo  30-35   9/18 Echo  45-50%    8/19 Echo  50-55%       Date Cr K Hgb  8/18 0.75 4.6    10/19 0.72 4.6   10/21 0.73 3.8 12.9       Past Medical History:  Diagnosis Date  . Acute systolic heart failure ([place])   . Bradycardia    a. 02/2012  . Hyperglycemia   . IBS (irritable bowel syndrome)   . Melanoma ([place])    right arm  . NSTEMI (non-ST elevated myocardial infarction) ([place])   . 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 Parkinson's disease     Past Surgical History:  Procedure Laterality Date  . APPENDECTOMY  1952  . COLONOSCOPY    . EYE SURGERY  07/2017   Cataract Sx  . LEFT HEART CATH AND CORONARY ANGIOGRAPHY N/A 08/03/2016   Procedure: Left Heart Cath and Coronary Angiography;  Surgeon: Leonie Man, MD;  Location: [facility];  Service: Cardiovascular;  Laterality: N/A;  . MELANOMA EXCISION  Right 2000   right arm  . PACEMAKER PLACEMENT  05/2012  . PACEMAKER REVISION N/A 03/14/2012   Procedure: PACEMAKER REVISION;  Surgeon: Thompson Grayer, MD;  [facility] LAB;  Service: Cardiovascular;  Laterality: N/A;  . PERMANENT PACEMAKER INSERTION N/A 03/06/2012   Procedure: PERMANENT PACEMAKER INSERTION;  Surgeon: Deboraha Sprang, MD;  Location[facility] CATH LAB;  Service: Cardiovascular;  Laterality: N/A;  . 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 aspirin EC 81 MG tablet Take 81 mg by mouth at bedtime.    . Calcium-Phosphorus-Vitamin D (CITRACAL CALCIUM GUMMIES PO) Take by mouth as directed.    . carbidopa-levodopa (SINEMET IR) 25-100 MG per tablet Take 2 tablets by mouth 6 (six) times daily.     . Cholecalciferol (VITAMIN D3) 2000 units capsule Take 2,000 Units by mouth daily.    . COCONUT OIL PO Take 6 oz by mouth daily.     Marland Kitchen dicyclomine (BENTYL) 10 MG capsule TAKE 1 CAPSULE BY MOUTH EVERY 6 HOURS AS NEEDED 60 capsule 2  . melatonin 5 MG TABS Take 5 mg by mouth at bedtime.    . midodrine (PROAMATINE) 5 MG  tablet Take 1 tablet (5 mg total) by mouth 2 (two) times daily with a meal. 180 tablet 1  . Omega-3 1000 MG CAPS Take 1 capsule by mouth daily.    . polyethylene glycol powder (GLYCOLAX/MIRALAX) 17 GM/SCOOP powder Take 8.5 g by mouth. 5 times a week    . Probiotic Product (PROBIOTIC PO) Take 1 capsule by mouth daily.    Marland Kitchen Propylene Glycol (SYSTANE COMPLETE OP) Apply 1 drop to eye daily.    . Simethicone (GAS-X PO) Take 1 tablet by mouth. After meals and at bedtime    . VITAMIN E PO Take 1 capsule by mouth daily.     No current facility-administered medications for this visit.    No Known Allergies  Review of Systems negative except from HPI and PMH  Physical Exam BP 122/70 (BP Location: Left Arm, Patient Position: Sitting, Cuff Size: Normal)   Pulse 60   Ht 5' 1.5" (1.562 m)   Wt 106 lb  (48.1 kg)   SpO2 97%   BMI 19.70 kg/m  Well developed and well nourished in no acute distress HENT normal Neck supple with JVP-flat Clear Device pocket well healed; without hematoma or erythema.  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  We will try and keep her on low-dose carvedilol  We will have her take her ProAmatine with her morning Sinemet and hopefully will be working before she gets up an hour later.  We discussed that she could take her ProAmatine 3 times a day every 3-4 hours  Discussed the importance of protecting against falls, using a bench in the showers etc.  She tells me that she is not going to wear an abdominal binder or thigh sleeves.  Uncomfortable and does not feel like she needs it yet.

## 2020-09-13 ENCOUNTER — Other Ambulatory Visit: Payer: Self-pay | Admitting: Internal Medicine

## 2020-10-02 ENCOUNTER — Other Ambulatory Visit: Payer: Self-pay | Admitting: Internal Medicine

## 2020-10-14 ENCOUNTER — Ambulatory Visit (INDEPENDENT_AMBULATORY_CARE_PROVIDER_SITE_OTHER): Payer: Medicare HMO

## 2020-10-14 ENCOUNTER — Telehealth: Payer: Self-pay | Admitting: Pharmacist

## 2020-10-14 ENCOUNTER — Other Ambulatory Visit: Payer: Self-pay

## 2020-10-14 DIAGNOSIS — Z0000 Encounter for general adult medical examination without abnormal findings: Secondary | ICD-10-CM | POA: Diagnosis not present

## 2020-10-14 NOTE — Chronic Care Management (AMB) (Signed)
Chronic Care Management Pharmacy Assistant   Name: Lindsey Bryant  MRN: [id number] DOB: 1940/05/28  Reason for Encounter: Disease State/ Hypertension Assessment Call.    Conditions to be addressed/monitored: HTN   Recent office visits:  None.   Recent consult visits:  08/26/20 Virl Axe MD (Cardiology) - seen for complete heart block and other chronic conditions. Changed midodrine from 5mg  twice daily to 5mg  3 times daily. Follow up in 1 year.   06/13/20 Wells Guiles Tat DO (Neurology) -  Seen for Parkinson's disease and neurogenic orthostatic hypotension. Midodrine changed from 2.5mg  3 times daily to 5mg  twice daily. Referral to speech therapy placed. No follow up noted.   Hospital visits:  None in previous 6 months  Medications: Outpatient Encounter Medications as of 10/14/2020  Medication Sig  . aspirin EC 81 MG tablet Take 81 mg by mouth at bedtime.  . Calcium-Phosphorus-Vitamin D (CITRACAL CALCIUM GUMMIES PO) Take by mouth as directed.  . carbidopa-levodopa (SINEMET IR) 25-100 MG per tablet Take 2 tablets by mouth 6 (six) times daily.   . Cholecalciferol (VITAMIN D3) 2000 units capsule Take 2,000 Units by mouth daily.  . COCONUT OIL PO Take 6 oz by mouth daily.   Marland Kitchen dicyclomine (BENTYL) 10 MG capsule TAKE 1 CAPSULE BY MOUTH EVERY 6 HOURS AS NEEDED  . melatonin 5 MG TABS Take 5 mg by mouth at bedtime.  . midodrine (PROAMATINE) 5 MG tablet Take 1 tablet (5 mg total) by mouth 3 (three) times daily with meals. Please take at 7am, 11am and 3pm  . Omega-3 1000 MG CAPS Take 1 capsule by mouth daily.  . polyethylene glycol powder (GLYCOLAX/MIRALAX) 17 GM/SCOOP powder Take 8.5 g by mouth. 5 times a week  . Probiotic Product (PROBIOTIC PO) Take 1 capsule by mouth daily.  Marland Kitchen Propylene Glycol (SYSTANE COMPLETE OP) Apply 1 drop to eye daily.  . Simethicone (GAS-X PO) Take 1 tablet by mouth. After meals and at bedtime  . VITAMIN E PO Take 1 capsule by mouth daily.   No facility-administered  encounter medications on file as of 10/14/2020.    Reviewed chart prior to disease state call. Spoke with patient regarding BP  Recent Office Vitals: BP Readings from Last 3 Encounters:  08/26/20 122/70  06/13/20 (!) 98/59  05/06/20 126/80   Pulse Readings from Last 3 Encounters:  08/26/20 60  05/06/20 80  04/22/20 76    Wt Readings from Last 3 Encounters:  08/26/20 106 lb (48.1 kg)  06/12/20 105 lb (47.6 kg)  05/06/20 108 lb 6 oz (49.2 kg)     Kidney Function Lab Results  Component Value Date/Time   CREATININE 0.73 02/27/2020 11:53 AM   CREATININE 0.78 12/26/2019 02:54 PM   CREATININE 0.78 03/20/2018 03:46 PM   GFR 78.57 02/27/2020 11:53 AM   GFRNONAA 78 01/03/2017 03:15 PM   GFRAA 90 01/03/2017 03:15 PM    BMP Latest Ref Rng & Units 02/27/2020 12/26/2019 03/20/2018  Glucose 70 - 99 mg/dL 90 102(H) 95  BUN 6 - 23 mg/dL 10 10 14   Creatinine 0.40 - 1.20 mg/dL 0.73 0.78 0.78  BUN/Creat Ratio 6 - 22 (calc) - NOT APPLICABLE -  Sodium 790 - 145 mEq/L 131(L) 133(L) 136  Potassium 3.5 - 5.1 mEq/L 3.8 4.8 4.2  Chloride 96 - 112 mEq/L 97 98 103  CO2 19 - 32 mEq/L 29 32 26  Calcium 8.4 - 10.5 mg/dL 9.6 9.9 9.3    . Current antihypertensive regimen:  o None  at this time.   . How often are you checking your Blood Pressure?   . Current home BP readings:   . What recent interventions/DTPs have been made by any provider to improve Blood Pressure control since last CPP Visit: lisinopril put on hold by PCP on 12/26/19 due to Orthostatic Hypotension.   . Any recent hospitalizations or ED visits since last visit with CPP? No  . What diet changes have been made to improve Blood Pressure Control?  o   . What exercise is being done to improve your Blood Pressure Control?  o   Adherence Review: Is the patient currently on ACE/ARB medication? No Does the patient have >5 day gap between last estimated fill dates? No  Multiple unsuccessful attempts to reach patient by phone.    Star Rating Drugs:  None.   [place]  Clinical Pharmacist Assistant [phone]

## 2020-10-14 NOTE — Patient Instructions (Signed)
Lindsey Bryant , Thank you for taking time to come for your Medicare Wellness Visit. I appreciate your ongoing commitment to your health goals. Please review the following plan we discussed and let me know if I can assist you in the future.   Screening recommendations/referrals: Colonoscopy: no longer required  Mammogram: no longer required  Bone Density: current due 06/13/2023 Recommended yearly ophthalmology/optometry visit for glaucoma screening and checkup Recommended yearly dental visit for hygiene and checkup  Vaccinations: Influenza vaccine: current due in fall 2022 Pneumococcal vaccine: completed series  Tdap vaccine: current 06/04/2011 Shingles vaccine: completed series   Advanced directives: will provide copies   Conditions/risks identified: none   Next appointment: none    Preventive Care 30 Years and Older, Female Preventive care refers to lifestyle choices and visits with your health care provider that can promote health and wellness. What does preventive care include?  A yearly physical exam. This is also called an annual well check.  Dental exams once or twice a year.  Routine eye exams. Ask your health care provider how often you should have your eyes checked.  Personal lifestyle choices, including:  Daily care of your teeth and gums.  Regular physical activity.  Eating a healthy diet.  Avoiding tobacco and drug use.  Limiting alcohol use.  Practicing safe sex.  Taking low-dose aspirin every day.  Taking vitamin and mineral supplements as recommended by your health care provider. What happens during an annual well check? The services and screenings done by your health care provider during your annual well check will depend on your age, overall health, lifestyle risk factors, and family history of disease. Counseling  Your health care provider may ask you questions about your:  Alcohol use.  Tobacco use.  Drug use.  Emotional well-being.  Home  and relationship well-being.  Sexual activity.  Eating habits.  History of falls.  Memory and ability to understand (cognition).  Work and work Statistician.  Reproductive health. Screening  You may have the following tests or measurements:  Height, weight, and BMI.  Blood pressure.  Lipid and cholesterol levels. These may be checked every 5 years, or more frequently if you are over 47 years old.  Skin check.  Lung cancer screening. You may have this screening every year starting at age 2 if you have a 30-pack-year history of smoking and currently smoke or have quit within the past 15 years.  Fecal occult blood test (FOBT) of the stool. You may have this test every year starting at age 2.  Flexible sigmoidoscopy or colonoscopy. You may have a sigmoidoscopy every 5 years or a colonoscopy every 10 years starting at age 82.  Hepatitis C blood test.  Hepatitis B blood test.  Sexually transmitted disease (STD) testing.  Diabetes screening. This is done by checking your blood sugar (glucose) after you have not eaten for a while (fasting). You may have this done every 1-3 years.  Bone density scan. This is done to screen for osteoporosis. You may have this done starting at age 54.  Mammogram. This may be done every 1-2 years. Talk to your health care provider about how often you should have regular mammograms. Talk with your health care provider about your test results, treatment options, and if necessary, the need for more tests. Vaccines  Your health care provider may recommend certain vaccines, such as:  Influenza vaccine. This is recommended every year.  Tetanus, diphtheria, and acellular pertussis (Tdap, Td) vaccine. You may need a Td booster  every 10 years.  Zoster vaccine. You may need this after age 37.  Pneumococcal 13-valent conjugate (PCV13) vaccine. One dose is recommended after age 36.  Pneumococcal polysaccharide (PPSV23) vaccine. One dose is recommended  after age 53. Talk to your health care provider about which screenings and vaccines you need and how often you need them. This information is not intended to replace advice given to you by your health care provider. Make sure you discuss any questions you have with your health care provider. Document Released: 06/06/2015 Document Revised: 01/28/2016 Document Reviewed: 03/11/2015 Elsevier Interactive Patient Education  2017 [place] Prevention in the Home Falls can cause injuries. They can happen to people of all ages. There are many things you can do to make your home safe and to help prevent falls. What can I do on the outside of my home?  Regularly fix the edges of walkways and driveways and fix any cracks.  Remove anything that might make you trip as you walk through a door, such as a raised step or threshold.  Trim any bushes or trees on the path to your home.  Use bright outdoor lighting.  Clear any walking paths of anything that might make someone trip, such as rocks or tools.  Regularly check to see if handrails are loose or broken. Make sure that both sides of any steps have handrails.  Any raised decks and porches should have guardrails on the edges.  Have any leaves, snow, or ice cleared regularly.  Use sand or salt on walking paths during winter.  Clean up any spills in your garage right away. This includes oil or grease spills. What can I do in the bathroom?  Use night lights.  Install grab bars by the toilet and in the tub and shower. Do not use towel bars as grab bars.  Use non-skid mats or decals in the tub or shower.  If you need to sit down in the shower, use a plastic, non-slip stool.  Keep the floor dry. Clean up any water that spills on the floor as soon as it happens.  Remove soap buildup in the tub or shower regularly.  Attach bath mats securely with double-sided non-slip rug tape.  Do not have throw rugs and other things on the floor  that can make you trip. What can I do in the bedroom?  Use night lights.  Make sure that you have a light by your bed that is easy to reach.  Do not use any sheets or blankets that are too big for your bed. They should not hang down onto the floor.  Have a firm chair that has side arms. You can use this for support while you get dressed.  Do not have throw rugs and other things on the floor that can make you trip. What can I do in the kitchen?  Clean up any spills right away.  Avoid walking on wet floors.  Keep items that you use a lot in easy-to-reach places.  If you need to reach something above you, use a strong step stool that has a grab bar.  Keep electrical cords out of the way.  Do not use floor polish or wax that makes floors slippery. If you must use wax, use non-skid floor wax.  Do not have throw rugs and other things on the floor that can make you trip. What can I do with my stairs?  Do not leave any items on the stairs.  Make  sure that there are handrails on both sides of the stairs and use them. Fix handrails that are broken or loose. Make sure that handrails are as long as the stairways.  Check any carpeting to make sure that it is firmly attached to the stairs. Fix any carpet that is loose or worn.  Avoid having throw rugs at the top or bottom of the stairs. If you do have throw rugs, attach them to the floor with carpet tape.  Make sure that you have a light switch at the top of the stairs and the bottom of the stairs. If you do not have them, ask someone to add them for you. What else can I do to help prevent falls?  Wear shoes that:  Do not have high heels.  Have rubber bottoms.  Are comfortable and fit you well.  Are closed at the toe. Do not wear sandals.  If you use a stepladder:  Make sure that it is fully opened. Do not climb a closed stepladder.  Make sure that both sides of the stepladder are locked into place.  Ask someone to hold it  for you, if possible.  Clearly mark and make sure that you can see:  Any grab bars or handrails.  First and last steps.  Where the edge of each step is.  Use tools that help you move around (mobility aids) if they are needed. These include:  Canes.  Walkers.  Scooters.  Crutches.  Turn on the lights when you go into a dark area. Replace any light bulbs as soon as they burn out.  Set up your furniture so you have a clear path. Avoid moving your furniture around.  If any of your floors are uneven, fix them.  If there are any pets around you, be aware of where they are.  Review your medicines with your doctor. Some medicines can make you feel dizzy. This can increase your chance of falling. Ask your doctor what other things that you can do to help prevent falls. This information is not intended to replace advice given to you by your health care provider. Make sure you discuss any questions you have with your health care provider. Document Released: 03/06/2009 Document Revised: 10/16/2015 Document Reviewed: 06/14/2014 Elsevier Interactive Patient Education  2017 [employer].

## 2020-10-14 NOTE — Progress Notes (Signed)
Subjective:   Lindsey Bryant is a 80 y.o. female who presents for an Initial Medicare Annual Wellness Visit. Pt unable to connect to Video Visit . I connected with Lindsey Bryant  today by telephone and verified that I am speaking with the correct person using two identifiers. Location patient: home Location provider: work Persons participating in the virtual visit: patient, provider.   I discussed the limitations, risks, security and privacy concerns of performing an evaluation and management service by telephone and the availability of in person appointments. I also discussed with the patient that there may be a patient responsible charge related to this service. The patient expressed understanding and verbally consented to this telephonic visit.    Interactive audio and video telecommunications were attempted between this provider and patient, however failed, due to patient having technical difficulties OR patient did not have access to video capability.  We continued and completed visit with audio only.    Review of Systems   n/a       Objective:    There were no vitals filed for this visit. There is no height or weight on file to calculate BMI.  Advanced Directives 07/14/2020 06/12/2020 01/01/2020 03/26/2019 07/29/2016 03/13/2012 03/04/2012  Does Patient Have a Medical Advance Directive? Yes Yes Yes Yes Yes Patient has advance directive, copy not in chart Patient has advance directive, copy not in chart  Type of Advance Directive [place];Living will [place];Living will;Out of facility DNR (pink MOST or yellow form) [place];Living will Living will;Healthcare Power of [place]  Does patient want to make changes to medical advance directive? - - - No - Patient declined No - Patient declined - -  Copy of [place] in Chart? No - copy requested - - No - copy requested No - copy requested Copy requested from family Copy requested from family  Pre-existing out of facility DNR order (yellow form or pink MOST form) - - - - - No No    Current Medications (verified) Outpatient Encounter Medications as of 10/14/2020  Medication Sig  . aspirin EC 81 MG tablet Take 81 mg by mouth at bedtime.  . Calcium-Phosphorus-Vitamin D (CITRACAL CALCIUM GUMMIES PO) Take by mouth as directed.  . carbidopa-levodopa (SINEMET IR) 25-100 MG per tablet Take 2 tablets by mouth 6 (six) times daily.   . Cholecalciferol (VITAMIN D3) 2000 units capsule Take 2,000 Units by mouth daily.  . COCONUT OIL PO Take 6 oz by mouth daily.   Marland Kitchen dicyclomine (BENTYL) 10 MG capsule TAKE 1 CAPSULE BY MOUTH EVERY 6 HOURS AS NEEDED  . melatonin 5 MG TABS Take 5 mg by mouth at bedtime.  . midodrine (PROAMATINE) 5 MG tablet Take 1 tablet (5 mg total) by mouth 3 (three) times daily with meals. Please take at 7am, 11am and 3pm  . Omega-3 1000 MG CAPS Take 1 capsule by mouth daily.  . polyethylene glycol powder (GLYCOLAX/MIRALAX) 17 GM/SCOOP powder Take 8.5 g by mouth. 5 times a week  . Probiotic Product (PROBIOTIC PO) Take 1 capsule by mouth daily.  Marland Kitchen Propylene Glycol (SYSTANE COMPLETE OP) Apply 1 drop to eye daily.  . Simethicone (GAS-X PO) Take 1 tablet by mouth. After meals and at bedtime  . VITAMIN E PO Take 1 capsule by mouth daily.   No facility-administered encounter medications on file as of 10/14/2020.  Allergies (verified) Patient has no known allergies.   History: Past Medical History:  Diagnosis Date  . Acute systolic heart failure ([place])   . Bradycardia    a. 02/2012  . Hyperglycemia   . IBS (irritable bowel syndrome)   . Melanoma ([place])    right arm  . NSTEMI (non-ST elevated myocardial infarction) ([place])   . 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 Parkinson's disease    Past Surgical History:  Procedure Laterality Date  .  APPENDECTOMY  1952  . COLONOSCOPY    . EYE SURGERY  07/2017   Cataract Sx  . LEFT HEART CATH AND CORONARY ANGIOGRAPHY N/A 08/03/2016   Procedure: Left Heart Cath and Coronary Angiography;  Surgeon: Leonie Man, MD;  Location: [facility];  Service: Cardiovascular;  Laterality: N/A;  . MELANOMA EXCISION Right 2000   right arm  . PACEMAKER PLACEMENT  05/2012  . PACEMAKER REVISION N/A 03/14/2012   Procedure: PACEMAKER REVISION;  Surgeon: Thompson Grayer, MD;  [facility] C[facility] LAB;  Service: Cardiovascular;  Laterality: N/A;  . PERMANENT PACEMAKER INSERTION N/A 03/06/2012   Procedure: PERMANENT PACEMAKER INSERTION;  Surgeon: Deboraha Sprang, MD;  Location: [facility] CATH LAB;  Service: Cardiovascular;  Laterality: N/A;  . 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 Alcohol abuse Father   . Hypothyroidism Daughter   . Heart attack Mother 86       Details unclear  . Diabetes Brother   . Breast cancer Other   . Hypothyroidism Daughter   . Colon cancer Neg Hx   . Esophageal cancer Neg Hx   . Stomach cancer Neg Hx   . Pancreatic cancer Neg Hx   . Liver disease Neg Hx    Social History   Socioeconomic History  . Marital status: Married    Spouse name: Not on file  . Number of children: 2  . Years of education: Not on file  . Highest education level: Not on file  Occupational History  . Not on file  Tobacco Use  . Smoking status: Former Research scientist (life sciences)  . Smokeless tobacco: Never Used  . Tobacco comment: 10 yrs in college  Vaping Use  . Vaping Use: Never used  Substance and Sexual Activity  . Alcohol use: No    Comment: recovering alcoholic  . Drug use: No  . Sexual activity: Not on file  Other Topics Concern  . Not on file  Social History Narrative   Retired Pharmacist, hospital, married. 2 daughters I think.   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      Right Handed   Social Determinants of Health   Financial  Resource Strain: Low Risk   . Difficulty of Paying Living Expenses: Not hard at all  Food Insecurity: Not on file  Transportation Needs: No Transportation Needs  . Lack of Transportation (Medical): No  . Lack of Transportation (Non-Medical): No  Physical Activity: Not on file  Stress: Not on file  Social Connections: Not on file    Tobacco Counseling Counseling given: Not Answered Comment: 10 yrs in college   Clinical Intake:                 Diabetic?no         Activities of Daily Living No flowsheet data found.  Patient Care Team: [name], MD as PCP - General (Family Medicine) Deboraha Sprang, MD as PCP -  Cardiology (Cardiology) Deboraha Sprang, MD as PCP - Electrophysiology (Cardiology) Sherlyn Lees, MD as Referring Physician (Neurology) Earnie Larsson, [facility] as Pharmacist (Pharmacist)  Indicate any recent Medical Services you may have received from other than [facility] providers in the past year (date may be approximate).     Assessment:   This is a routine wellness examination for Alegra.  Hearing/Vision screen No exam data present  Dietary issues and exercise activities discussed:    Goals Addressed   None    Depression Screen PHQ 2/9 Scores 03/20/2018 07/17/2014 07/17/2014 06/05/2013  PHQ - 2 Score 0 0 0 0    Fall Risk Fall Risk  06/12/2020 01/01/2020 03/20/2018 07/17/2014 07/17/2014  Falls in the past year? 0 - No No No  Number falls in past yr: 0 0 - - -  Injury with Fall? 0 0 - - -    FALL RISK PREVENTION PERTAINING TO THE HOME:  Any stairs in or around the home? No  If so, are there any without handrails? Yes  Home free of loose throw rugs in walkways, pet beds, electrical cords, etc? Yes  Adequate lighting in your home to reduce risk of falls? Yes   ASSISTIVE DEVICES UTILIZED TO PREVENT FALLS:  Life alert? Yes  Use of a cane, walker or w/c? Yes  Grab bars in the bathroom? Yes  Shower chair or bench in shower? Yes   Elevated toilet seat or a handicapped toilet? Yes     Cognitive Function:     Normal cognitive status assessed by direct observation by this Nurse Health Advisor. No abnormalities found.      Immunizations Immunization History  Administered Date(s) Administered  . Influenza Split 03/03/2012  . Influenza Whole 03/24/2007, 02/26/2008, 03/13/2010  . Influenza, High Dose Seasonal PF 03/09/2013, 02/17/2015, 01/29/2017, 02/08/2018, 02/19/2019  . Influenza, Seasonal, Injecte, Preservative Fre 03/10/2014  . Influenza-Unspecified 02/24/2016  . Moderna Sars-Covid-2 Vaccination 06/23/2019, 07/21/2019  . Pneumococcal Conjugate-13 07/17/2014  . Pneumococcal Polysaccharide-23 05/24/2006  . Tdap 06/04/2011  . Zoster 02/26/2008  . Zoster Recombinat (Shingrix) 04/06/2018, 06/15/2018, 07/21/2018    TDAP status: Up to date  Flu Vaccine status: Up to date  Pneumococcal vaccine status: Up to date  Covid-19 vaccine status: Completed vaccines  Qualifies for Shingles Vaccine? Yes   Zostavax completed No   Shingrix Completed?: Yes  Screening Tests Health Maintenance  Topic Date Due  . Hepatitis C Screening  Never done  . PNA vac Low Risk Adult (2 of 2 - PPSV23) 07/18/2015  . COVID-19 Vaccine (3 - Moderna risk 4-dose series) 08/18/2019  . INFLUENZA VACCINE  12/22/2020  . TETANUS/TDAP  06/03/2021  . DEXA SCAN  Completed  . HPV VACCINES  Aged Out    Health Maintenance  Health Maintenance Due  Topic Date Due  . Hepatitis C Screening  Never done  . PNA vac Low Risk Adult (2 of 2 - PPSV23) 07/18/2015  . COVID-19 Vaccine (3 - Moderna risk 4-dose series) 08/18/2019    Colorectal cancer screening: No longer required.   Mammogram status: No longer required due to age.  Bone Density status: Completed 05/2018. Results reflect: Bone density results: NORMAL. Repeat every 5 years.  Lung Cancer Screening: (Low Dose CT Chest recommended if Age 33-80 years, 30 pack-year currently smoking OR  have quit w/in 15years.) does not qualify.   Lung Cancer Screening Referral: n/a  Additional Screening:  Hepatitis C Screening: does not qualify  Vision Screening: Recommended annual ophthalmology exams for early detection of  glaucoma and other disorders of the eye. Is the patient up to date with their annual eye exam?  Yes  Who is the provider or what is the name of the office in which the patient attends annual eye exams? Dr.Turner If pt is not established with a provider, would they like to be referred to a provider to establish care? No .   Dental Screening: Recommended annual dental exams for proper oral hygiene  Community Resource Referral / Chronic Care Management: CRR required this visit?  No   CCM required this visit?  No      Plan:     I have personally reviewed and noted the following in the patient's chart:   . Medical and social history . Use of alcohol, tobacco or illicit drugs  . Current medications and supplements including opioid prescriptions. Patient is not currently taking opioid prescriptions. . Functional ability and status . Nutritional status . Physical activity . Advanced directives . List of other physicians . Hospitalizations, surgeries, and ER visits in previous 12 months . Vitals . Screenings to include cognitive, depression, and falls . Referrals and appointments  In addition, I have reviewed and discussed with patient certain preventive protocols, quality metrics, and best practice recommendations. A written personalized care plan for preventive services as well as general preventive health recommendations were provided to patient.     Randel Pigg, LPN   [id number]   Nurse Notes: none

## 2020-10-15 NOTE — Telephone Encounter (Cosign Needed)
2nd attempt

## 2020-10-16 NOTE — Telephone Encounter (Cosign Needed)
3rd attempt

## 2020-10-29 ENCOUNTER — Ambulatory Visit (INDEPENDENT_AMBULATORY_CARE_PROVIDER_SITE_OTHER): Payer: Medicare HMO

## 2020-10-29 DIAGNOSIS — I428 Other cardiomyopathies: Secondary | ICD-10-CM | POA: Diagnosis not present

## 2020-10-30 LAB — CUP PACEART REMOTE DEVICE CHECK
Battery Impedance: 1316 Ohm
Battery Remaining Longevity: 50 mo
Battery Voltage: 2.77 V
Brady Statistic AP VP Percent: 26 %
Brady Statistic AP VS Percent: 0 %
Brady Statistic AS VP Percent: 74 %
Brady Statistic AS VS Percent: 0 %
Date Time Interrogation Session: 20220609122530
Implantable Lead Implant Date: 20131014
Implantable Lead Implant Date: 20131014
Implantable Lead Location: 753859
Implantable Lead Location: 753860
Implantable Lead Model: 5076
Implantable Lead Model: 5076
Implantable Pulse Generator Implant Date: 20131014
Lead Channel Impedance Value: 443 Ohm
Lead Channel Impedance Value: 700 Ohm
Lead Channel Pacing Threshold Amplitude: 0.5 V
Lead Channel Pacing Threshold Amplitude: 0.75 V
Lead Channel Pacing Threshold Pulse Width: 0.4 ms
Lead Channel Pacing Threshold Pulse Width: 0.4 ms
Lead Channel Setting Pacing Amplitude: 2 V
Lead Channel Setting Pacing Amplitude: 2.5 V
Lead Channel Setting Pacing Pulse Width: 0.4 ms
Lead Channel Setting Sensing Sensitivity: 4 mV

## 2020-11-11 DIAGNOSIS — H04123 Dry eye syndrome of bilateral lacrimal glands: Secondary | ICD-10-CM | POA: Diagnosis not present

## 2020-11-12 NOTE — Progress Notes (Addendum)
Assessment/Plan:   1.  Parkinsons Disease, sx's since 2006  -continue carbidopa/levodopa 25/100, 2 po 6 times per day (6/9/noon/3pm/6pm/9pm)  -Really looks quite good from a Parkinson's standpoint today.  2.  Neurogenic Orthostatic Hypotension  -not that orthostatic today but the BP's they brought in were lower.  Will slightly increase midodrine, 5 mg, 2 in the AM, 1 in the afternoon and evening  3.  Abdominal pain with hx of IBS (constipation and diarrhea)  -sees Dr. [name]  4.  Few episodes MS change  -suspect due to Neurogenic Orthostatic Hypotension  -pt worried about TIA.   Will do CT brain.  Unable to do MRI due to PPM Subjective:   Lindsey Bryant was seen today in follow up for Parkinsons disease.  My previous records were reviewed prior to todays visit as well as outside records available to me.  Patient with spouse who supplements the history.  Patient's husband describes 2 separate incidents that he felt were similar.  One was on March 4 and the second was on June 12.  On March 4, patient had difficulty sleeping and woke up and had 4 bowel movements.  She was disoriented with headache and blood pressure was low with systolic in the 09G.  She seemed to have trouble retrieving her words most of the day, but was back to baseline by 3:30 PM.  "I could see the word in my mind but I couldn't say it."  She had a similar incident on June 12, where she was described as being disoriented and needed her walker to go to the kitchen.  She could not complete sentences well and had a mild headache.  Her blood pressure was the opposite, with systolic in the 283M.  She slept bad the night before on both incidents.  pt denies falls.  Pt denies lightheadedness, near syncope.  No hallucinations.  Mood has been good.  Saw cardiology 08/26/20 and cardiology told patient that she should increase midodrine to 5 mg tid.  Pt thinks that this has helped overall.   Her husband brings in a BP log (sitting) and  BP's running in the 110's and lower.    Current prescribed movement disorder medications:  Carbidopa/levodopa 25/100, 2 tablets 6 times per day (6:30am, 9:30am, 12:30pm, 3:30, 6:30, 10:30pm)  Midodrine, 5 mg bid (increased last visit by me and cardiology increased it to tid on 08/26/20)    ALLERGIES:  No Known Allergies  CURRENT MEDICATIONS:  Outpatient Encounter Medications as of 11/13/2020  Medication Sig   aspirin EC 81 MG tablet Take 81 mg by mouth at bedtime.   Calcium-Phosphorus-Vitamin D (CITRACAL CALCIUM GUMMIES PO) Take by mouth as directed.   carbidopa-levodopa (SINEMET IR) 25-100 MG per tablet Take 2 tablets by mouth 6 (six) times daily.    Cholecalciferol (VITAMIN D3) 2000 units capsule Take 2,000 Units by mouth daily.   COCONUT OIL PO Take 6 oz by mouth daily.    dicyclomine (BENTYL) 10 MG capsule TAKE 1 CAPSULE BY MOUTH EVERY 6 HOURS AS NEEDED   melatonin 5 MG TABS Take 5 mg by mouth at bedtime.   midodrine (PROAMATINE) 5 MG tablet Take 1 tablet (5 mg total) by mouth 3 (three) times daily with meals. Please take at 7am, 11am and 3pm   Omega-3 1000 MG CAPS Take 1 capsule by mouth daily.   polyethylene glycol powder (GLYCOLAX/MIRALAX) 17 GM/SCOOP powder Take 8.5 g by mouth. 5 times a week   Probiotic Product (PROBIOTIC PO)  Take 1 capsule by mouth daily.   Propylene Glycol (SYSTANE COMPLETE OP) Apply 1 drop to eye daily.   Simethicone (GAS-X PO) Take 1 tablet by mouth. After meals and at bedtime   VITAMIN E PO Take 1 capsule by mouth daily.   No facility-administered encounter medications on file as of 11/13/2020.    Objective:   PHYSICAL EXAMINATION:    VITALS:   Vitals:   11/13/20 1533  BP: 118/74  Pulse: 68  SpO2: 98%  Weight: 109 lb (49.4 kg)  Height: 5\' 2"  (1.575 m)   Orthostatic VS for the past 72 hrs (Last 3 readings):  Orthostatic BP Patient Position BP Location Orthostatic Pulse  11/13/20 1618 122/78 Standing Left Leg 63  11/13/20 1617 138/84 Sitting  Left Arm 61  11/13/20 1616 126/84 Supine Left Arm 64     GEN:  The patient appears stated age and is in NAD. HEENT:  Normocephalic, atraumatic.  The mucous membranes are moist. The superficial temporal arteries are without ropiness or tenderness. CV:  RRR Lungs:  CTAB Neck/HEME:  There are no carotid bruits bilaterally.  Neurological examination:  Orientation: The patient is alert and oriented x3. Cranial nerves: There is good facial symmetry with mild facial hypomimia. The speech is fluent and clear. Soft palate rises symmetrically and there is no tongue deviation. Hearing is intact to conversational tone. Sensation: Sensation is intact to light touch throughout Motor: Strength is at least antigravity x4.  Movement examination: Tone: There is nl tone in the ue/le Abnormal movements: there is mild RLE dyskinesia Coordination:  There is no decremation with RAM's, with any form of RAMS, including alternating supination and pronation of the forearm, hand opening and closing, finger taps, heel taps and toe taps. Gait and Station: The patient has no difficulty arising out of a deep-seated chair without the use of the hands. The patient's stride length is good.      I have reviewed and interpreted the following labs independently    Chemistry      Component Value Date/Time   NA 131 (L) 02/27/2020 1153   NA 141 01/03/2017 1515   K 3.8 02/27/2020 1153   CL 97 02/27/2020 1153   CO2 29 02/27/2020 1153   BUN 10 02/27/2020 1153   BUN 13 01/03/2017 1515   CREATININE 0.73 02/27/2020 1153   CREATININE 0.78 12/26/2019 1454      Component Value Date/Time   CALCIUM 9.6 02/27/2020 1153   ALKPHOS 48 02/27/2020 1153   AST 15 02/27/2020 1153   ALT 2 02/27/2020 1153   BILITOT 0.5 02/27/2020 1153       Lab Results  Component Value Date   WBC 6.1 02/27/2020   HGB 12.9 02/27/2020   HCT 38.2 02/27/2020   MCV 100.0 02/27/2020   PLT 138.0 (L) 02/27/2020    Lab Results  Component Value  Date   TSH 1.45 12/26/2019     Total time spent on today's visit was 30 minutes, including both face-to-face time and nonface-to-face time.  Time included that spent on review of records (prior notes available to me/labs/imaging if pertinent), discussing treatment and goals, answering patient's questions and coordinating care.  Cc:  [name], MD

## 2020-11-13 ENCOUNTER — Other Ambulatory Visit: Payer: Self-pay

## 2020-11-13 ENCOUNTER — Encounter: Payer: Self-pay | Admitting: Neurology

## 2020-11-13 ENCOUNTER — Ambulatory Visit (INDEPENDENT_AMBULATORY_CARE_PROVIDER_SITE_OTHER): Payer: Medicare HMO | Admitting: Neurology

## 2020-11-13 VITALS — BP 118/74 | HR 68 | Ht 62.0 in | Wt 109.0 lb

## 2020-11-13 DIAGNOSIS — G20 Parkinson's disease: Secondary | ICD-10-CM

## 2020-11-13 DIAGNOSIS — R4182 Altered mental status, unspecified: Secondary | ICD-10-CM | POA: Diagnosis not present

## 2020-11-13 DIAGNOSIS — G903 Multi-system degeneration of the autonomic nervous system: Secondary | ICD-10-CM

## 2020-11-13 MED ORDER — MIDODRINE HCL 5 MG PO TABS
ORAL_TABLET | ORAL | 1 refills | Status: DC
Start: 2020-11-13 — End: 2020-12-09

## 2020-11-13 NOTE — Patient Instructions (Addendum)
A referral to [facility] has been placed for your CT brain someone will contact you directly to schedule your appt. They are located at [place]. Please contact them directly by calling [phone] with any questions regarding your referral.  Increase midodrine 5mg , 2 at 7am, 1 at 11am, 1 at 3pm.  Watch your BP and let me know if it gets too high.

## 2020-11-18 ENCOUNTER — Telehealth: Payer: Self-pay | Admitting: Pharmacist

## 2020-11-18 NOTE — Chronic Care Management (AMB) (Signed)
    Chronic Care Management Pharmacy Assistant   Name: Lindsey Bryant  MRN: [id number] DOB: 1940/08/29  Reason for Encounter: Disease State/ General Assessment Call.    Conditions to be addressed/monitored: IBS, Parkinsons Disease and NSTEMI.   Recent office visits:  None.   Recent consult visits:  11/13/20 Alonza Bogus DO (Neurology) - presented to clinic for follow up for altered mental status and other chronic conditions. Increased midodrine to 2 tablets at 7am, 1 at 11am and 1 at 3pm from 1 tablet at 7am, 11am and 3pm. Follow up in 5 months.   Hospital visits:  None in previous 6 months  Medications: Outpatient Encounter Medications as of 11/18/2020  Medication Sig   aspirin EC 81 MG tablet Take 81 mg by mouth at bedtime.   Calcium-Phosphorus-Vitamin D (CITRACAL CALCIUM GUMMIES PO) Take by mouth as directed.   carbidopa-levodopa (SINEMET IR) 25-100 MG per tablet Take 2 tablets by mouth 6 (six) times daily.    Cholecalciferol (VITAMIN D3) 2000 units capsule Take 2,000 Units by mouth daily.   COCONUT OIL PO Take 6 oz by mouth daily.    dicyclomine (BENTYL) 10 MG capsule TAKE 1 CAPSULE BY MOUTH EVERY 6 HOURS AS NEEDED   melatonin 5 MG TABS Take 5 mg by mouth at bedtime.   midodrine (PROAMATINE) 5 MG tablet 2 at 7am, 1 at 11am, 1 at 3pm   Omega-3 1000 MG CAPS Take 1 capsule by mouth daily.   polyethylene glycol powder (GLYCOLAX/MIRALAX) 17 GM/SCOOP powder Take 8.5 g by mouth. 5 times a week   Probiotic Product (PROBIOTIC PO) Take 1 capsule by mouth daily.   Propylene Glycol (SYSTANE COMPLETE OP) Apply 1 drop to eye daily.   Simethicone (GAS-X PO) Take 1 tablet by mouth. After meals and at bedtime   VITAMIN E PO Take 1 capsule by mouth daily.   No facility-administered encounter medications on file as of 11/18/2020.   Multiple unsuccessful attempts to reach patient by phone.   Star Rating Drugs:  None.   [place]  Clinical Pharmacist Assistant [phone]

## 2020-11-19 NOTE — Telephone Encounter (Cosign Needed)
2nd attempt

## 2020-11-21 NOTE — Progress Notes (Signed)
Remote pacemaker transmission.   

## 2020-11-22 ENCOUNTER — Other Ambulatory Visit: Payer: Self-pay | Admitting: Internal Medicine

## 2020-12-07 ENCOUNTER — Other Ambulatory Visit: Payer: Self-pay | Admitting: Internal Medicine

## 2020-12-13 ENCOUNTER — Other Ambulatory Visit: Payer: Self-pay | Admitting: Internal Medicine

## 2020-12-24 ENCOUNTER — Other Ambulatory Visit: Payer: Self-pay | Admitting: Internal Medicine

## 2021-01-12 ENCOUNTER — Other Ambulatory Visit: Payer: Self-pay

## 2021-01-12 ENCOUNTER — Telehealth: Payer: Self-pay | Admitting: Neurology

## 2021-01-12 MED ORDER — CARBIDOPA-LEVODOPA 25-100 MG PO TABS
2.0000 | ORAL_TABLET | Freq: Every day | ORAL | 0 refills | Status: DC
Start: 2021-01-12 — End: 2021-02-23

## 2021-01-12 NOTE — Telephone Encounter (Signed)
Patient called with her spouse on the line. She is needing refills sent to the pharmacy for carbidopa levodopa.   Patient only has enough to last until Wednesday this week.  Patient transferred care to Dr. Carles Collet, previously had this filled by past neurologist.  [facility] [employer] [facility] [address]

## 2021-01-14 ENCOUNTER — Inpatient Hospital Stay: Admission: RE | Admit: 2021-01-14 | Payer: Medicare HMO | Source: Ambulatory Visit

## 2021-01-21 ENCOUNTER — Telehealth: Payer: Self-pay

## 2021-01-21 NOTE — Telephone Encounter (Signed)
 imaging called voice mail left with CT approval number from [facility] [id number] PA is good from 8/31 until 9/29  PT called and informed that her PA was approved with [employer] and they are good to make an appointment ,

## 2021-01-28 ENCOUNTER — Ambulatory Visit (INDEPENDENT_AMBULATORY_CARE_PROVIDER_SITE_OTHER): Payer: Medicare HMO

## 2021-01-28 DIAGNOSIS — I428 Other cardiomyopathies: Secondary | ICD-10-CM

## 2021-01-28 LAB — CUP PACEART REMOTE DEVICE CHECK
Battery Impedance: 1450 Ohm
Battery Remaining Longevity: 46 mo
Battery Voltage: 2.77 V
Brady Statistic AP VP Percent: 25 %
Brady Statistic AP VS Percent: 0 %
Brady Statistic AS VP Percent: 75 %
Brady Statistic AS VS Percent: 0 %
Date Time Interrogation Session: 20220907094656
Implantable Lead Implant Date: 20131014
Implantable Lead Implant Date: 20131014
Implantable Lead Location: 753859
Implantable Lead Location: 753860
Implantable Lead Model: 5076
Implantable Lead Model: 5076
Implantable Pulse Generator Implant Date: 20131014
Lead Channel Impedance Value: 432 Ohm
Lead Channel Impedance Value: 678 Ohm
Lead Channel Pacing Threshold Amplitude: 0.5 V
Lead Channel Pacing Threshold Amplitude: 0.625 V
Lead Channel Pacing Threshold Pulse Width: 0.4 ms
Lead Channel Pacing Threshold Pulse Width: 0.4 ms
Lead Channel Setting Pacing Amplitude: 2 V
Lead Channel Setting Pacing Amplitude: 2.5 V
Lead Channel Setting Pacing Pulse Width: 0.4 ms
Lead Channel Setting Sensing Sensitivity: 4 mV

## 2021-02-04 ENCOUNTER — Ambulatory Visit (INDEPENDENT_AMBULATORY_CARE_PROVIDER_SITE_OTHER): Payer: Medicare HMO | Admitting: Gastroenterology

## 2021-02-04 ENCOUNTER — Encounter: Payer: Self-pay | Admitting: Gastroenterology

## 2021-02-04 VITALS — BP 140/76 | HR 73 | Ht 62.0 in | Wt 108.6 lb

## 2021-02-04 DIAGNOSIS — R1031 Right lower quadrant pain: Secondary | ICD-10-CM | POA: Diagnosis not present

## 2021-02-04 DIAGNOSIS — K589 Irritable bowel syndrome without diarrhea: Secondary | ICD-10-CM

## 2021-02-04 DIAGNOSIS — R1032 Left lower quadrant pain: Secondary | ICD-10-CM

## 2021-02-04 NOTE — Progress Notes (Signed)
02/04/2021 LACI PFENDER [id number] 1940-08-10   HISTORY OF PRESENT ILLNESS: This is a patient of Dr. Celesta Aver who follows with him for issues with her irritable bowel, constipation predominant.  She is here today with her husband for complaints of lower abdominal pain.  She uses MiraLAX for constipation and dicyclomine for her abdominal pain.  She admits that she is using those regularly.  She says that over the past couple of months she has been having lower abdominal pain.  It has been occurring almost every day.  Says when she first wakes up in the morning she feels great, but then shortly after that she develops this lower abdominal pain.  She says that it is usually worse in the mornings and sometimes it will completely resolve throughout the day.  She says that it will usually get somewhat better if she can have a bowel movement, but not always.  She still cycling between normal stools, then a few days of constipation, then loose stool/diarrhea that she calls "evacuation day".  These abdominal pains are preventing her from being able to get out of house in the morning.  Her husband reports weight loss, but it looks like she has been fairly stable.  She had a CT scan of the abdomen and pelvis with contrast in October 2021 that was ordered by Dr. [name] for complaints of abdominal issues and that was unremarkable at that time.  Last colonoscopy was in 2015.  Did not have diverticulosis at that time.   SIBO negative previously.  Past Medical History:  Diagnosis Date   Acute systolic heart failure (HCC)    Bradycardia    a. 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   Parkinson's disease    Past Surgical History:  Procedure Laterality Date   APPENDECTOMY  1952   COLONOSCOPY     EYE SURGERY  07/2017   Cataract Sx   LEFT HEART CATH AND CORONARY  ANGIOGRAPHY N/A 08/03/2016   Procedure: Left Heart Cath and Coronary Angiography;  Surgeon: Leonie Man, MD;  Location: [place];  Service: Cardiovascular;  Laterality: N/A;   MELANOMA EXCISION Right 2000   right arm   PACEMAKER PLACEMENT  05/2012   PACEMAKER REVISION N/A 03/14/2012   Procedure: PACEMAKER REVISION;  Surgeon: Thompson Grayer, MD;  Location: [facility] CATH LAB;  Service: Cardiovascular;  Laterality: N/A;   PERMANENT PACEMAKER INSERTION N/A 03/06/2012   Procedure: PERMANENT PACEMAKER INSERTION;  Surgeon: Deboraha Sprang, MD;  Location: [facility] CATH LAB;  Service: Cardiovascular;  Laterality: N/A;   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 She has never used smokeless tobacco. She reports that she does not drink alcohol and does not use drugs. family history includes Alcohol abuse in her father; Breast cancer in an other family member; Diabetes in her brother; Heart attack (age of onset: 73) in her mother; Hypothyroidism in her daughter and daughter. No Known Allergies    Outpatient Encounter Medications as of 02/04/2021  Medication Sig   aspirin EC 81 MG tablet Take 81 mg by mouth at bedtime.   Calcium-Phosphorus-Vitamin D (CITRACAL CALCIUM GUMMIES PO) Take by mouth as directed.   carbidopa-levodopa (SINEMET IR) 25-100 MG tablet Take 2 tablets by mouth 6 (six) times  daily.   Cholecalciferol (VITAMIN D3) 2000 units capsule Take 2,000 Units by mouth daily.   COCONUT OIL PO Take 6 oz by mouth daily.    dicyclomine (BENTYL) 10 MG capsule TAKE 1 CAPSULE BY MOUTH EVERY 6 HOURS AS NEEDED   melatonin 5 MG TABS Take 5 mg by mouth at bedtime.   midodrine (PROAMATINE) 5 MG tablet TAKE 1 TABLET BY MOUTH 3 TIMES A DAY WITH MEALS. TAKE AT 7AM, 11AM, & 3PM (Patient taking differently: Take 1 mg by mouth 3 (three) times daily with meals. TAKE 2 TABLETs BY MOUTH in the morning and 1 tablet by mouth WITH MEALS at.  11AM, & 3PM)   Omega-3 1000  MG CAPS Take 1 capsule by mouth daily.   polyethylene glycol powder (GLYCOLAX/MIRALAX) 17 GM/SCOOP powder Take 8.5 g by mouth. 5 times a week   Probiotic Product (PROBIOTIC PO) Take 1 capsule by mouth daily.   Propylene Glycol (SYSTANE COMPLETE OP) Apply 1 drop to eye daily.   Simethicone (GAS-X PO) Take 1 tablet by mouth. After meals and at bedtime   VITAMIN E PO Take 1 capsule by mouth daily.   No facility-administered encounter medications on file as of 02/04/2021.     REVIEW OF SYSTEMS  : All other systems reviewed and negative except where noted in the History of Present Illness.   PHYSICAL EXAM: BP 140/76   Pulse 73   Ht '5\' 2"'$  (1.575 m)   Wt 108 lb 9.6 oz (49.3 kg)   BMI 19.86 kg/m  General: Well developed white female in no acute distress Head: Normocephalic and atraumatic Eyes:  Sclerae anicteric,conjunctive pink. Ears: Normal auditory acuity Lungs: Clear throughout to auscultation; no W/R/R. Heart: Regular rate and rhythm; no M/R/G. Abdomen: Soft, non-distended.  BS present.  Mild lower TTP on both the left and right. Musculoskeletal: Symmetrical with no gross deformities  Skin: No lesions on visible extremities Extremities: No edema  Neurological: Alert oriented x 4, grossly non-focal Psychological:  Alert and cooperative. Normal mood and affect  ASSESSMENT AND PLAN: *80 year old female with history of IBS, constipation predominant, who is treated with MiraLAX and dicyclomine now here with complaints of lower abdominal pain.  She is moving her bowels, but cycles between normal stool then constipation then loose/mushy stool.  Her lower abdominal pain seems to occur whether she has moved her bowels or not.  She usually has this pain more so in the mornings and then sometimes completely resolves throughout the day.  Does get somewhat better after she has moved her bowels.  We will have her continue the MiraLAX, but I am also going to have her add Benefiber 2 teaspoons mixed  in 8 ounces of liquid daily.  She will continue her dicyclomine as well.  I suggested maybe she take IBgard twice daily for a while to see if this is gas related if that would help.  Samples were given.  She is going to call or message our office back in 10 to 14 days with an update.  Question of something like Remeron would be helpful in her situation, which would also be good as an appetite stimulant for her as well.   CC:  [name], MD

## 2021-02-04 NOTE — Patient Instructions (Addendum)
If you are age 79 or older, your body mass index should be between 23-30. Your Body mass index is 19.86 kg/m. If this is out of the aforementioned range listed, please consider follow up with your Primary Care Provider.  If you are age 67 or younger, your body mass index should be between 19-25. Your Body mass index is 19.86 kg/m. If this is out of the aformentioned range listed, please consider follow up with your Primary Care Provider.   __________________________________________________________  The [facility] providers would like to encourage you to use [facility] to communicate with providers for non-urgent requests or questions.  Due to long hold times on the telephone, sending your provider a message by [facility] may be a faster and more efficient way to get a response.  Please allow 48 business hours for a response.  Please remember that this is for non-urgent requests.   Please start Fiber supplement such as 2 teaspoons in 8 oz of water or juice daily.  Please use IBGARD twice a day.   Please call in 10-14- days with an update, ask for Patty, RN   It was a pleasure to see you today!  Thank you for trusting me with your gastrointestinal care!

## 2021-02-05 NOTE — Progress Notes (Signed)
Remote pacemaker transmission.   

## 2021-02-10 ENCOUNTER — Ambulatory Visit
Admission: RE | Admit: 2021-02-10 | Discharge: 2021-02-10 | Disposition: A | Discharge status: Home / Self Care | Payer: Medicare HMO | Source: Ambulatory Visit | Attending: Neurology | Admitting: Neurology

## 2021-02-10 ENCOUNTER — Other Ambulatory Visit: Payer: Self-pay

## 2021-02-10 DIAGNOSIS — R4182 Altered mental status, unspecified: Secondary | ICD-10-CM | POA: Diagnosis not present

## 2021-02-11 ENCOUNTER — Telehealth: Payer: Self-pay | Admitting: Neurology

## 2021-02-11 NOTE — Telephone Encounter (Signed)
Please call patient and let her know CT brain looked ok Please call [place] [facility] and find out why it took so long to do CT brain (did patient r/s or what was issue)?  We put order in on 6/23 and it was done 9/20.

## 2021-02-11 NOTE — Telephone Encounter (Signed)
Called patient back and let her know Dr. Carles Collet recommendation. She said she will follow up with PCP and cardiology

## 2021-02-11 NOTE — Telephone Encounter (Signed)
Called patient with results and asked her if [facility] having trouble getting her on the schedule. She said they had insurance issues and had to cancel last appointment because they could not get approval. Patient taking Midodrine she said you prescribed for her. She said her Blood pressure has been elevated and gave me 163/90 @ 8:30 after taking medication and 172/92 @ 11 after taking medication. Patient wanting to know if she should stop taking it. Reported she is feeling well when she takes it as well

## 2021-02-12 ENCOUNTER — Other Ambulatory Visit: Payer: Self-pay | Admitting: Internal Medicine

## 2021-02-12 ENCOUNTER — Other Ambulatory Visit: Payer: Self-pay

## 2021-02-12 MED ORDER — MIRTAZAPINE 15 MG PO TABS: 15.0000 mg | ORAL_TABLET | Freq: Every day | ORAL | 6 refills | Status: DC

## 2021-02-17 NOTE — Telephone Encounter (Signed)
Explained the rationale for trying mirtazapine for nausea and appetite stimulant.  The patient reports that her appetite is really not so bad.  Nausea was relatively transient.  She has a fairly complicated medical history and it is a little bit difficult to tease it out but between her reports and her husband's reports she does feel better when she defecates.  However she still has pain regardless.  Dicyclomine has been quite effective but she is afraid to take it "too much" due to what she reads about it.  Note midodrine can cause "pain" when I look at side effects question if they could include abdominal pain.  She is currently on a half cap full of MiraLAX daily.  I have recommended the following:  We will forego the mirtazapine  She is to take dicyclomine 10 mg at lunchtime before that meal  She may use that as needed as well  Increase MiraLAX to 1 cap daily  She is to MyChart message me with the results of these changes

## 2021-02-21 ENCOUNTER — Other Ambulatory Visit: Payer: Self-pay | Admitting: Internal Medicine

## 2021-02-21 ENCOUNTER — Other Ambulatory Visit: Payer: Self-pay | Admitting: Neurology

## 2021-02-21 DIAGNOSIS — G20 Parkinson's disease: Secondary | ICD-10-CM

## 2021-02-23 ENCOUNTER — Other Ambulatory Visit: Payer: Self-pay

## 2021-02-28 DIAGNOSIS — Z87891 Personal history of nicotine dependence: Secondary | ICD-10-CM | POA: Diagnosis not present

## 2021-02-28 DIAGNOSIS — Z950 Presence of cardiac pacemaker: Secondary | ICD-10-CM | POA: Diagnosis not present

## 2021-02-28 DIAGNOSIS — Z85820 Personal history of malignant melanoma of skin: Secondary | ICD-10-CM | POA: Diagnosis not present

## 2021-02-28 DIAGNOSIS — M1990 Unspecified osteoarthritis, unspecified site: Secondary | ICD-10-CM | POA: Diagnosis not present

## 2021-02-28 DIAGNOSIS — I951 Orthostatic hypotension: Secondary | ICD-10-CM | POA: Diagnosis not present

## 2021-02-28 DIAGNOSIS — R69 Illness, unspecified: Secondary | ICD-10-CM | POA: Diagnosis not present

## 2021-02-28 DIAGNOSIS — K581 Irritable bowel syndrome with constipation: Secondary | ICD-10-CM | POA: Diagnosis not present

## 2021-02-28 DIAGNOSIS — Z8249 Family history of ischemic heart disease and other diseases of the circulatory system: Secondary | ICD-10-CM | POA: Diagnosis not present

## 2021-02-28 DIAGNOSIS — R030 Elevated blood-pressure reading, without diagnosis of hypertension: Secondary | ICD-10-CM | POA: Diagnosis not present

## 2021-02-28 DIAGNOSIS — G20 Parkinson's disease: Secondary | ICD-10-CM | POA: Diagnosis not present

## 2021-02-28 DIAGNOSIS — Z833 Family history of diabetes mellitus: Secondary | ICD-10-CM | POA: Diagnosis not present

## 2021-02-28 DIAGNOSIS — F1021 Alcohol dependence, in remission: Secondary | ICD-10-CM | POA: Diagnosis not present

## 2021-02-28 DIAGNOSIS — Z811 Family history of alcohol abuse and dependence: Secondary | ICD-10-CM | POA: Diagnosis not present

## 2021-03-11 ENCOUNTER — Telehealth: Payer: Self-pay | Admitting: Pharmacist

## 2021-03-11 NOTE — Chronic Care Management (AMB) (Signed)
    Chronic Care Management Pharmacy Assistant   Name: Lindsey Bryant  MRN: [id number] DOB: 02/17/1941  Reason for Encounter: Disease State/ General Assessment Call.   Conditions to be addressed/monitored: IBS and Parkinson's disease.   Recent office visits:  None.  Recent consult visits:  02/04/21 Alonza Bogus PA-C (Gastroenterology) - seen for bilateral lower abdominal pain. No medication changes. Call in 2 weeks for update.   Hospital visits:  None in previous 6 months  Medications: Outpatient Encounter Medications as of 03/11/2021  Medication Sig   mirtazapine (REMERON) 15 MG tablet Take 1 tablet (15 mg total) by mouth at bedtime.   aspirin EC 81 MG tablet Take 81 mg by mouth at bedtime.   Calcium-Phosphorus-Vitamin D (CITRACAL CALCIUM GUMMIES PO) Take by mouth as directed.   carbidopa-levodopa (SINEMET IR) 25-100 MG tablet TAKE 2 TABLETS BY MOUTH 6 (SIX) TIMES DAILY.   Cholecalciferol (VITAMIN D3) 2000 units capsule Take 2,000 Units by mouth daily.   COCONUT OIL PO Take 6 oz by mouth daily.    dicyclomine (BENTYL) 10 MG capsule TAKE 1 CAPSULE BY MOUTH EVERY 6 HOURS AS NEEDED   melatonin 5 MG TABS Take 5 mg by mouth at bedtime.   midodrine (PROAMATINE) 5 MG tablet TAKE 1 TABLET BY MOUTH 3 TIMES A DAY WITH MEALS. TAKE AT 7AM, 11AM, & 3PM (Patient taking differently: Take 1 mg by mouth 3 (three) times daily with meals. TAKE 2 TABLETs BY MOUTH in the morning and 1 tablet by mouth WITH MEALS at.  11AM, & 3PM)   Omega-3 1000 MG CAPS Take 1 capsule by mouth daily.   polyethylene glycol powder (GLYCOLAX/MIRALAX) 17 GM/SCOOP powder Take 17 g by mouth daily.   Probiotic Product (PROBIOTIC PO) Take 1 capsule by mouth daily.   Propylene Glycol (SYSTANE COMPLETE OP) Apply 1 drop to eye daily.   Simethicone (GAS-X PO) Take 1 tablet by mouth. After meals and at bedtime   VITAMIN E PO Take 1 capsule by mouth daily.   No facility-administered encounter medications on file as of 03/11/2021.    Fill History: CARBIDOPA/LEVODOPA 25-100MG  TAB 02/07/2021 60   DICYCLOMINE HYDROCHLORIDE 10MG  CAP 02/16/2021 15   MIDODRINE 5MG  TAB 02/25/2021 90   MIRTAZAPINE 15MG  TAB 02/12/2021 30   Notes: Multiple unsuccessful attempts to reach patient.  Care Gaps:  AWV - completed on 10/14/20 Hepatitis C screening - never done Pneumonia vaccine - overdue since 2017 Covid-19 vaccine booster 3  - overdue since 21 Flu vaccine - due  Star Rating Drugs:  None.  [place]  Clinical Pharmacist Assistant [phone]

## 2021-03-13 NOTE — Telephone Encounter (Cosign Needed)
2nd attempt

## 2021-03-17 NOTE — Telephone Encounter (Cosign Needed)
3rd attempt

## 2021-03-30 ENCOUNTER — Other Ambulatory Visit: Payer: Self-pay | Admitting: Neurology

## 2021-03-30 DIAGNOSIS — G20 Parkinson's disease: Secondary | ICD-10-CM

## 2021-03-30 DIAGNOSIS — R4182 Altered mental status, unspecified: Secondary | ICD-10-CM

## 2021-04-06 ENCOUNTER — Telehealth: Payer: Self-pay

## 2021-04-06 NOTE — Telephone Encounter (Signed)
Last OV for preventative care 04/22/20; AWV 10/14/20.  Pt notified of above; appt scheduled for 04/28/21.

## 2021-04-08 ENCOUNTER — Telehealth: Payer: Self-pay | Admitting: Pharmacist

## 2021-04-08 NOTE — Chronic Care Management (AMB) (Signed)
    Chronic Care Management Pharmacy Assistant   Name: Lindsey Bryant  MRN: [id number] DOB: Mar 17, 1941  Reason for Encounter: Disease State / General Assessment Call   Conditions to be addressed/monitored: IBS and Parkinsons  Recent office visits:  None  Recent consult visits:  None  Hospital visits:  None  Medications: Outpatient Encounter Medications as of 04/08/2021  Medication Sig   mirtazapine (REMERON) 15 MG tablet Take 1 tablet (15 mg total) by mouth at bedtime.   aspirin EC 81 MG tablet Take 81 mg by mouth at bedtime.   Calcium-Phosphorus-Vitamin D (CITRACAL CALCIUM GUMMIES PO) Take by mouth as directed.   carbidopa-levodopa (SINEMET IR) 25-100 MG tablet TAKE 2 TABLETS BY MOUTH 6 (SIX) TIMES DAILY.   Cholecalciferol (VITAMIN D3) 2000 units capsule Take 2,000 Units by mouth daily.   COCONUT OIL PO Take 6 oz by mouth daily.    dicyclomine (BENTYL) 10 MG capsule TAKE 1 CAPSULE BY MOUTH EVERY 6 HOURS AS NEEDED   melatonin 5 MG TABS Take 5 mg by mouth at bedtime.   midodrine (PROAMATINE) 5 MG tablet 2 TABLETS AT 7AM, 1 TABLET AT 11AM, 1 TABLET AT 3PM   Omega-3 1000 MG CAPS Take 1 capsule by mouth daily.   polyethylene glycol powder (GLYCOLAX/MIRALAX) 17 GM/SCOOP powder Take 17 g by mouth daily.   Probiotic Product (PROBIOTIC PO) Take 1 capsule by mouth daily.   Propylene Glycol (SYSTANE COMPLETE OP) Apply 1 drop to eye daily.   Simethicone (GAS-X PO) Take 1 tablet by mouth. After meals and at bedtime   VITAMIN E PO Take 1 capsule by mouth daily.   No facility-administered encounter medications on file as of 04/08/2021.   Fill History: CARBIDOPA-LEVODOPA 25-100 TAB 03/30/2021 90   DICYCLOMINE 10 MG CAPSULE 03/30/2021 15   MIDODRINE 5MG  TAB 02/25/2021 90   MIRTAZAPINE 15MG  TAB 02/12/2021 30    Notes:  Medications reviewed with patient, she is no longer taking an aspirin, Vit D3, coconut oil or Omega 3. Patient states she is doing fine with her current medications  no problems or issues.  Follow up scheduled.  Care Gaps: AWV - completed on 10/14/20 Hepatitis C screening - never done Pneumonia vaccine - overdue since 2017 Covid-19 vaccine booster 3  - overdue since 21 Flu vaccine - due  Star Rating Drugs: None  [place] Pharmacist Assistant [phone]

## 2021-04-18 NOTE — Telephone Encounter (Signed)
Removed medications from list that patient is no longer taking. Inquired about patient stopping use of aspirin on her own vs recommendation from provider. Patient's history of NSTEMI would make her a candidate for secondary prevention.  Jeni Salles, PharmD, [place] Pharmacist [facility]

## 2021-04-18 NOTE — Addendum Note (Signed)
Addended by: Viona Gilmore on: 04/18/2021 01:23 PM   Modules accepted: Orders

## 2021-04-20 NOTE — Chronic Care Management (AMB) (Addendum)
Called patient to check as to why Aspirin was discontinued.  Lindsey Bryant

## 2021-04-21 ENCOUNTER — Telehealth: Payer: Self-pay | Admitting: Pharmacist

## 2021-04-21 NOTE — Chronic Care Management (AMB) (Signed)
    Chronic Care Management Pharmacy Assistant   Name: NICO ROGNESS  MRN: [id number] DOB: 01/02/1941  Reason for Encounter: Call to patient to see why Aspirin was discontinued.   Unable to reach patient after several attempts.  Care Gaps: AWV - completed on 10/14/20 Hepatitis C screening - never done Pneumonia vaccine - overdue  Covid-19 vaccine - overdue  Flu vaccine - due  Star Rating Drugs: None  [place] Pharmacist Assistant [phone]

## 2021-04-21 NOTE — Chronic Care Management (AMB) (Signed)
2nd attempt to reach patient to inquire as to why Aspirin was discontinued.

## 2021-04-28 ENCOUNTER — Encounter: Payer: Self-pay | Admitting: Family Medicine

## 2021-04-28 ENCOUNTER — Ambulatory Visit (INDEPENDENT_AMBULATORY_CARE_PROVIDER_SITE_OTHER): Payer: Medicare HMO | Admitting: Family Medicine

## 2021-04-28 VITALS — BP 120/76 | HR 62 | Temp 97.6°F | Ht 62.0 in | Wt 112.9 lb

## 2021-04-28 DIAGNOSIS — Z0000 Encounter for general adult medical examination without abnormal findings: Secondary | ICD-10-CM | POA: Diagnosis not present

## 2021-04-28 LAB — BASIC METABOLIC PANEL
BUN: 14 mg/dL (ref 6–23)
CO2: 31 mEq/L (ref 19–32)
Calcium: 9.7 mg/dL (ref 8.4–10.5)
Chloride: 99 mEq/L (ref 96–112)
Creatinine, Ser: 0.71 mg/dL (ref 0.40–1.20)
GFR: 80.69 mL/min (ref >60.00)
Glucose, Bld: 96 mg/dL (ref 70–99)
Potassium: 4.1 mEq/L (ref 3.5–5.1)
Sodium: 135 mEq/L (ref 135–145)

## 2021-04-28 LAB — LIPID PANEL
Cholesterol: 198 mg/dL (ref 0–200)
HDL: 83.8 mg/dL (ref >39.00)
LDL Cholesterol: 105 mg/dL — ABNORMAL HIGH (ref 0–99)
NonHDL: 114.36
Total CHOL/HDL Ratio: 2
Triglycerides: 49 mg/dL (ref 0.0–149.0)
VLDL: 9.8 mg/dL (ref 0.0–40.0)

## 2021-04-28 LAB — CBC WITH DIFFERENTIAL/PLATELET
Basophils Absolute: 0 10*3/uL (ref 0.0–0.1)
Basophils Relative: 0.6 % (ref 0.0–3.0)
Eosinophils Absolute: 0.1 10*3/uL (ref 0.0–0.7)
Eosinophils Relative: 1.6 % (ref 0.0–5.0)
HCT: 38.3 % (ref 36.0–46.0)
Hemoglobin: 12.7 g/dL (ref 12.0–15.0)
Lymphocytes Relative: 26.1 % (ref 12.0–46.0)
Lymphs Abs: 1.5 10*3/uL (ref 0.7–4.0)
MCHC: 33.2 g/dL (ref 30.0–36.0)
MCV: 100.7 fl — ABNORMAL HIGH (ref 78.0–100.0)
Monocytes Absolute: 0.5 10*3/uL (ref 0.1–1.0)
Monocytes Relative: 8.9 % (ref 3.0–12.0)
Neutro Abs: 3.7 10*3/uL (ref 1.4–7.7)
Neutrophils Relative %: 62.8 % (ref 43.0–77.0)
Platelets: 172 10*3/uL (ref 150.0–400.0)
RBC: 3.81 Mil/uL — ABNORMAL LOW (ref 3.87–5.11)
RDW: 12.3 % (ref 11.5–15.5)
WBC: 5.9 10*3/uL (ref 4.0–10.5)

## 2021-04-28 LAB — HEPATIC FUNCTION PANEL
ALT: 3 U/L (ref 0–35)
AST: 16 U/L (ref 0–37)
Albumin: 4.2 g/dL (ref 3.5–5.2)
Alkaline Phosphatase: 62 U/L (ref 39–117)
Bilirubin, Direct: 0.1 mg/dL (ref 0.0–0.3)
Total Bilirubin: 0.5 mg/dL (ref 0.2–1.2)
Total Protein: 6.6 g/dL (ref 6.0–8.3)

## 2021-04-28 LAB — TSH: TSH: 1.22 u[IU]/mL (ref 0.35–5.50)

## 2021-04-28 NOTE — Progress Notes (Signed)
Established Patient Office Visit  Subjective:  Patient ID: Lindsey Bryant, female    DOB: 1940-06-30  Age: 80 y.o. MRN: [id number]  CC:  Chief Complaint  Patient presents with   Annual Exam    HPI Lindsey Bryant presents for physical exam.  She has history of CAD, irritable bowel syndrome, Parkinson's disease, history of melanoma.  She is followed by neurology and dermatology.  She lost a lot of weight past couple years and actually recently gained some back.  She feels better overall.  She had gone down about 102 pounds and currently up around 113.  She states her appetite is improved.  Denies any recent falls.  She has had some orthostatic issues in the past and is currently taking midodrine and those symptoms seem to be stable.  Health maintenance reviewed:  -Due for repeat mammogram. -Flu vaccine already given -Pneumonia vaccines complete -Has received previous Shingrix -Aging out of repeat colonoscopy  Family history and social history reviewed with no significant changes  Past Medical History:  Diagnosis Date   Acute systolic heart failure (HCC)    Bradycardia    a. 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   Parkinson's disease     Past Surgical History:  Procedure Laterality Date   APPENDECTOMY  1952   COLONOSCOPY     EYE SURGERY  07/2017   Cataract Sx   LEFT HEART CATH AND CORONARY ANGIOGRAPHY N/A 08/03/2016   Procedure: Left Heart Cath and Coronary Angiography;  Surgeon: Leonie Man, MD;  Location: [facility];  Service: Cardiovascular;  Laterality: N/A;   MELANOMA EXCISION Right 2000   right arm   PACEMAKER PLACEMENT  05/2012   PACEMAKER REVISION N/A 03/14/2012   Procedure: PACEMAKER REVISION;  Surgeon: Thompson Grayer, MD;  Location: [facility] CATH LAB;  Service: Cardiovascular;  Laterality: N/A;   PERMANENT PACEMAKER  INSERTION N/A 03/06/2012   Procedure: PERMANENT PACEMAKER INSERTION;  Surgeon: Deboraha Sprang, MD;  Location: [facility] CATH LAB;  Service: Cardiovascular;  Laterality: N/A;   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    Family History  Problem Relation Age of Onset   Alcohol abuse Father    Hypothyroidism Daughter    Heart attack Mother 77       Details unclear   Diabetes Brother    Breast cancer Other    Hypothyroidism Daughter    Colon cancer Neg Hx    Esophageal cancer Neg Hx    Stomach cancer Neg Hx    Pancreatic cancer Neg Hx    Liver disease Neg Hx     Social History   Socioeconomic History   Marital status: Married    Spouse name: Not on file   Number of children: 2   Years of education: Not on file   Highest education level: Not on file  Occupational History   Not on file  Tobacco Use   Smoking status: Former   Smokeless tobacco: Never   Tobacco comments:    10 yrs in college  Vaping Use   Vaping Use: Never used  Substance and Sexual Activity   Alcohol use: No    Comment: recovering alcoholic   Drug use: No   Sexual activity: Not on file  Other Topics Concern   Not on file  Social History Narrative   Retired Pharmacist, hospital, married. 2 daughters I think.   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      Right Handed   Social Determinants of Health   Financial Resource Strain: Not on file  Food Insecurity: No Food Insecurity   Worried About Charity fundraiser in the Last Year: Never true   Ran Out of Food in the Last Year: Never true  Transportation Needs: Not on file  Physical Activity: Sufficiently Active   Days of Exercise per Week: 5 days   Minutes of Exercise per Session: 30 min  Stress: No Stress Concern Present   Feeling of Stress : Not at all  Social Connections: Moderately Isolated   Frequency of Communication with Friends and Family: Twice a week   Frequency of Social Gatherings with Friends and Family: Twice a week   Attends  Religious Services: Never   Marine scientist or Organizations: No   Attends Music therapist: Never   Marital Status: Married  Human resources officer Violence: Not At Risk   Fear of Current or Ex-Partner: No   Emotionally Abused: No   Physically Abused: No   Sexually Abused: No    Outpatient Medications Prior to Visit  Medication Sig Dispense Refill   aspirin EC 81 MG tablet Take 81 mg by mouth at bedtime.     Calcium-Phosphorus-Vitamin D (CITRACAL CALCIUM GUMMIES PO) Take by mouth as directed.     carbidopa-levodopa (SINEMET IR) 25-100 MG tablet TAKE 2 TABLETS BY MOUTH 6 (SIX) TIMES DAILY. 1080 tablet 0   dicyclomine (BENTYL) 10 MG capsule TAKE 1 CAPSULE BY MOUTH EVERY 6 HOURS AS NEEDED 60 capsule 2   melatonin 5 MG TABS Take 5 mg by mouth at bedtime.     midodrine (PROAMATINE) 5 MG tablet 2 TABLETS AT 7AM, 1 TABLET AT 11AM, 1 TABLET AT 3PM 360 tablet 0   polyethylene glycol powder (GLYCOLAX/MIRALAX) 17 GM/SCOOP powder Take 17 g by mouth daily.     Probiotic Product (PROBIOTIC PO) Take 1 capsule by mouth daily.     Propylene Glycol (SYSTANE COMPLETE OP) Apply 1 drop to eye daily.     Simethicone (GAS-X PO) Take 1 tablet by mouth. After meals and at bedtime     VITAMIN E PO Take 1 capsule by mouth daily.     mirtazapine (REMERON) 15 MG tablet Take 1 tablet (15 mg total) by mouth at bedtime. 30 tablet 6   No facility-administered medications prior to visit.    No Known Allergies  ROS Review of Systems  Constitutional:  Negative for activity change, appetite change, fatigue, fever and unexpected weight change.  HENT:  Negative for ear pain, hearing loss, sore throat and trouble swallowing.   Eyes:  Negative for visual disturbance.  Respiratory:  Negative for cough and shortness of breath.   Cardiovascular:  Negative for chest pain and palpitations.  Gastrointestinal:  Negative for abdominal pain, blood in stool, constipation and diarrhea.  Endocrine: Negative for  polydipsia and polyuria.  Genitourinary:  Negative for dysuria and hematuria.  Musculoskeletal:  Negative for arthralgias, back pain and myalgias.  Skin:  Negative for rash.  Neurological:  Negative for dizziness, syncope and headaches.  Hematological:  Negative for adenopathy.  Psychiatric/Behavioral:  Negative for confusion and dysphoric mood.      Objective:    Physical Exam Constitutional:      Appearance: She is well-developed.  HENT:     Head: Normocephalic and  atraumatic.     Right Ear: Tympanic membrane normal.     Left Ear: Tympanic membrane normal.  Neck:     Thyroid: No thyromegaly.  Cardiovascular:     Rate and Rhythm: Normal rate and regular rhythm.     Heart sounds: Normal heart sounds.  Pulmonary:     Effort: No respiratory distress.     Breath sounds: Normal breath sounds. No wheezing or rales.  Abdominal:     General: Bowel sounds are normal. There is no distension.     Palpations: Abdomen is soft. There is no mass.     Tenderness: There is no abdominal tenderness. There is no guarding or rebound.  Musculoskeletal:        General: Normal range of motion.     Cervical back: Normal range of motion and neck supple.     Right lower leg: No edema.     Left lower leg: No edema.  Lymphadenopathy:     Cervical: No cervical adenopathy.  Skin:    Findings: No rash.  Neurological:     Mental Status: She is alert and oriented to person, place, and time.     Cranial Nerves: No cranial nerve deficit.  Psychiatric:        Behavior: Behavior normal.        Thought Content: Thought content normal.        Judgment: Judgment normal.    BP 120/76 (BP Location: Left Arm, Patient Position: Sitting, Cuff Size: Normal)   Pulse 62   Temp 97.6 F (36.4 C) (Oral)   Ht 5\' 2"  (1.575 m)   Wt 112 lb 14.4 oz (51.2 kg)   SpO2 97%   BMI 20.65 kg/m  Wt Readings from Last 3 Encounters:  04/28/21 112 lb 14.4 oz (51.2 kg)  02/04/21 108 lb 9.6 oz (49.3 kg)  11/13/20 109 lb (49.4  kg)     Health Maintenance Due  Topic Date Due   COVID-19 Vaccine (4 - Booster) 04/28/2021    There are no preventive care reminders to display for this patient.  Lab Results  Component Value Date   TSH 1.45 12/26/2019   Lab Results  Component Value Date   WBC 6.1 02/27/2020   HGB 12.9 02/27/2020   HCT 38.2 02/27/2020   MCV 100.0 02/27/2020   PLT 138.0 (L) 02/27/2020   Lab Results  Component Value Date   NA 131 (L) 02/27/2020   K 3.8 02/27/2020   CO2 29 02/27/2020   GLUCOSE 90 02/27/2020   BUN 10 02/27/2020   CREATININE 0.73 02/27/2020   BILITOT 0.5 02/27/2020   ALKPHOS 48 02/27/2020   AST 15 02/27/2020   ALT 2 02/27/2020   PROT 7.0 02/27/2020   ALBUMIN 4.4 02/27/2020   CALCIUM 9.6 02/27/2020   ANIONGAP 8 08/04/2016   GFR 78.57 02/27/2020   Lab Results  Component Value Date   CHOL 194 12/26/2019   Lab Results  Component Value Date   HDL 84 12/26/2019   Lab Results  Component Value Date   LDLCALC 91 12/26/2019   Lab Results  Component Value Date   TRIG 94 12/26/2019   Lab Results  Component Value Date   CHOLHDL 2.3 12/26/2019   Lab Results  Component Value Date   HGBA1C 5.3 07/29/2016      Assessment & Plan:   Problem List Items Addressed This Visit   None Visit Diagnoses     Physical exam    -  Primary  Relevant Orders   Basic metabolic panel   Lipid panel   CBC with Differential/Platelet   TSH   Hepatic function panel     We discussed several health maintenance issues as follows  -Discussed fall prevention with handout given -Flu vaccine already given -Other immunizations up-to-date -Encouraged her to set up repeat mammogram soon -Continue regular weightbearing exercise  No orders of the defined types were placed in this encounter.   Follow-up: No follow-ups on file.    [name], MD

## 2021-04-28 NOTE — Patient Instructions (Signed)
Consider setting up repeat mammogram.  

## 2021-04-29 ENCOUNTER — Ambulatory Visit (INDEPENDENT_AMBULATORY_CARE_PROVIDER_SITE_OTHER): Payer: Medicare HMO

## 2021-04-29 DIAGNOSIS — I428 Other cardiomyopathies: Secondary | ICD-10-CM | POA: Diagnosis not present

## 2021-04-29 LAB — CUP PACEART REMOTE DEVICE CHECK
Battery Impedance: 1479 Ohm
Battery Remaining Longevity: 45 mo
Battery Voltage: 2.77 V
Brady Statistic AP VP Percent: 22 %
Brady Statistic AP VS Percent: 0 %
Brady Statistic AS VP Percent: 77 %
Brady Statistic AS VS Percent: 0 %
Date Time Interrogation Session: 20221207144531
Implantable Lead Implant Date: 20131014
Implantable Lead Implant Date: 20131014
Implantable Lead Location: 753859
Implantable Lead Location: 753860
Implantable Lead Model: 5076
Implantable Lead Model: 5076
Implantable Pulse Generator Implant Date: 20131014
Lead Channel Impedance Value: 404 Ohm
Lead Channel Impedance Value: 657 Ohm
Lead Channel Pacing Threshold Amplitude: 0.375 V
Lead Channel Pacing Threshold Amplitude: 0.625 V
Lead Channel Pacing Threshold Pulse Width: 0.4 ms
Lead Channel Pacing Threshold Pulse Width: 0.4 ms
Lead Channel Setting Pacing Amplitude: 2 V
Lead Channel Setting Pacing Amplitude: 2.5 V
Lead Channel Setting Pacing Pulse Width: 0.4 ms
Lead Channel Setting Sensing Sensitivity: 4 mV

## 2021-05-01 DIAGNOSIS — H04123 Dry eye syndrome of bilateral lacrimal glands: Secondary | ICD-10-CM | POA: Diagnosis not present

## 2021-05-01 DIAGNOSIS — H5203 Hypermetropia, bilateral: Secondary | ICD-10-CM | POA: Diagnosis not present

## 2021-05-08 NOTE — Progress Notes (Signed)
Remote pacemaker transmission.   

## 2021-05-09 ENCOUNTER — Emergency Department (HOSPITAL_BASED_OUTPATIENT_CLINIC_OR_DEPARTMENT_OTHER): Payer: Medicare HMO

## 2021-05-09 ENCOUNTER — Emergency Department (HOSPITAL_BASED_OUTPATIENT_CLINIC_OR_DEPARTMENT_OTHER)
Admission: EM | Admit: 2021-05-09 | Discharge: 2021-05-09 | Disposition: A | Discharge status: Home / Self Care | Payer: Medicare HMO | Attending: Emergency Medicine | Admitting: Emergency Medicine

## 2021-05-09 ENCOUNTER — Encounter (HOSPITAL_BASED_OUTPATIENT_CLINIC_OR_DEPARTMENT_OTHER): Payer: Self-pay

## 2021-05-09 DIAGNOSIS — S42291A Other displaced fracture of upper end of right humerus, initial encounter for closed fracture: Secondary | ICD-10-CM

## 2021-05-09 DIAGNOSIS — S42301A Unspecified fracture of shaft of humerus, right arm, initial encounter for closed fracture: Secondary | ICD-10-CM | POA: Diagnosis not present

## 2021-05-09 DIAGNOSIS — S4991XA Unspecified injury of right shoulder and upper arm, initial encounter: Secondary | ICD-10-CM | POA: Diagnosis present

## 2021-05-09 DIAGNOSIS — W108XXA Fall (on) (from) other stairs and steps, initial encounter: Secondary | ICD-10-CM | POA: Insufficient documentation

## 2021-05-09 DIAGNOSIS — Z7982 Long term (current) use of aspirin: Secondary | ICD-10-CM | POA: Diagnosis not present

## 2021-05-09 DIAGNOSIS — S4981XA Other specified injuries of right shoulder and upper arm, initial encounter: Secondary | ICD-10-CM | POA: Diagnosis not present

## 2021-05-09 DIAGNOSIS — Z79899 Other long term (current) drug therapy: Secondary | ICD-10-CM | POA: Diagnosis not present

## 2021-05-09 DIAGNOSIS — S42351A Displaced comminuted fracture of shaft of humerus, right arm, initial encounter for closed fracture: Secondary | ICD-10-CM | POA: Diagnosis not present

## 2021-05-09 DIAGNOSIS — Z743 Need for continuous supervision: Secondary | ICD-10-CM | POA: Diagnosis not present

## 2021-05-09 DIAGNOSIS — Z950 Presence of cardiac pacemaker: Secondary | ICD-10-CM | POA: Diagnosis not present

## 2021-05-09 DIAGNOSIS — S42201A Unspecified fracture of upper end of right humerus, initial encounter for closed fracture: Secondary | ICD-10-CM | POA: Insufficient documentation

## 2021-05-09 DIAGNOSIS — Z87891 Personal history of nicotine dependence: Secondary | ICD-10-CM | POA: Diagnosis not present

## 2021-05-09 DIAGNOSIS — W19XXXA Unspecified fall, initial encounter: Secondary | ICD-10-CM | POA: Diagnosis not present

## 2021-05-09 DIAGNOSIS — R0902 Hypoxemia: Secondary | ICD-10-CM | POA: Diagnosis not present

## 2021-05-09 DIAGNOSIS — M7989 Other specified soft tissue disorders: Secondary | ICD-10-CM | POA: Diagnosis not present

## 2021-05-09 DIAGNOSIS — I5021 Acute systolic (congestive) heart failure: Secondary | ICD-10-CM | POA: Diagnosis not present

## 2021-05-09 MED ORDER — OXYCODONE-ACETAMINOPHEN 5-325 MG PO TABS
1.0000 | ORAL_TABLET | Freq: Once | ORAL | Status: AC
  Administered 2021-05-09: 1 via ORAL
  Filled 2021-05-09: qty 1

## 2021-05-09 MED ORDER — HYDROMORPHONE HCL 1 MG/ML IJ SOLN
0.5000 mg | Freq: Once | INTRAMUSCULAR | Status: AC
  Administered 2021-05-09: 0.5 mg via INTRAVENOUS
  Filled 2021-05-09: qty 1

## 2021-05-09 MED ORDER — OXYCODONE-ACETAMINOPHEN 5-325 MG PO TABS: 1.0000 | ORAL_TABLET | Freq: Four times a day (QID) | ORAL | 0 refills | Status: DC | PRN

## 2021-05-09 MED ORDER — ONDANSETRON HCL 4 MG/2ML IJ SOLN
4.0000 mg | Freq: Once | INTRAMUSCULAR | Status: AC
  Administered 2021-05-09: 4 mg via INTRAVENOUS
  Filled 2021-05-09: qty 2

## 2021-05-09 MED ORDER — MORPHINE SULFATE (PF) 4 MG/ML IV SOLN
4.0000 mg | Freq: Once | INTRAVENOUS | Status: AC
  Administered 2021-05-09: 4 mg via INTRAVENOUS
  Filled 2021-05-09: qty 1

## 2021-05-09 NOTE — Discharge Instructions (Addendum)
You will need to wear the sling pretty much all the time unless you are changing your clothes or showering.  You are not to put any weight on the right arm. Also there was a spot in the bone that they recommended doing an MRI and the orthopeadist can follow up on that too.  Also in addition to the pain medication you can use lidocaine patches to help with pain and voltaren gel

## 2021-05-09 NOTE — ED Notes (Signed)
Pt states she is fine when sitting still and states she is currently at a 6 ot 10

## 2021-05-09 NOTE — ED Provider Notes (Signed)
[facility] EMERGENCY DEPT Provider Note   CSN: [id number] Arrival date & time: 05/09/21  1718     History Chief Complaint  Patient presents with   Fall   Shoulder Injury    Lindsey Bryant is a 80 y.o. female.  Patient is a 80 year old female with a history of Parkinson's disease, NSTEMI, neurogenic orthostatic hypotension, complete heart block status post pacemaker who is presenting today after a fall.  Patient reports she was speaking with her husband and going down a step when she slid down the 1 step and landed on her right shoulder.  She denies hitting her head, loss of consciousness or any neck pain.  She had severe pain in her right shoulder since the fall.  She denies any pain in her legs or her left arm.  She is also having some pain in her right wrist.  The pain in her right shoulder is 9 out of 10 and severe with any type of movement.  It does not particularly radiate.  She was given 100 mcg of fentanyl in route by EMS.  Patient does not take any anticoagulation.  She reports the pain medication did help with the pain.  The history is provided by the patient.      Past Medical History:  Diagnosis Date   Acute systolic heart failure (HCC)    Bradycardia    a. 02/2012   Hyperglycemia    IBS (irritable bowel syndrome)    Melanoma ([place])    right arm   NSTEMI (non-ST elevated myocardial infarction) ([place])    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/2012   Parkinson's disease     Patient Active Problem List   Diagnosis Date Noted   NICM (nonischemic cardiomyopathy) ([place]) 08/25/2020   Neurogenic orthostatic hypotension ([place]) 01/01/2020   Obstructed internal hernia    Bilateral lower abdominal pain 07/29/2016   Elevated TSH 07/29/2016   NSTEMI (non-ST elevated myocardial infarction) ([place])    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/2012   Complete heart block-narrow QRS escape 03/03/2012   MENOPAUSAL SYNDROME 09/17/2009   MELANOMA, 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   Parkinson's disease ([place])  11/21/2006   Irritable bowel syndrome 11/21/2006    Past Surgical History:  Procedure Laterality Date   APPENDECTOMY  1952   COLONOSCOPY     EYE SURGERY  07/2017   Cataract Sx   LEFT HEART CATH AND CORONARY ANGIOGRAPHY N/A 08/03/2016   Procedure: Left Heart Cath and Coronary Angiography;  Surgeon: Leonie Man, MD;  Location: [facility];  Service: Cardiovascular;  Laterality: N/A;   MELANOMA EXCISION Right 2000   right arm   PACEMAKER PLACEMENT  05/2012   PACEMAKER REVISION N/A 03/14/2012   Procedure: PACEMAKER REVISION;  Surgeon: Thompson Grayer, MD;  Location: [facility] CATH LAB;  Service: Cardiovascular;  Laterality: N/A;   PERMANENT PACEMAKER INSERTION N/A 03/06/2012   Procedure: PERMANENT PACEMAKER INSERTION;  Surgeon: Deboraha Sprang, MD;  Location: [facility] CATH LAB;  Service: Cardiovascular;  Laterality: N/A;   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     OB History   No obstetric history on file.     Family History  Problem Relation Age of Onset   Alcohol abuse Father    Hypothyroidism Daughter    Heart attack Mother 17       Details unclear   Diabetes Brother    Breast cancer Other    Hypothyroidism Daughter    Colon cancer Neg Hx    Esophageal cancer Neg Hx  Stomach cancer Neg Hx    Pancreatic cancer Neg Hx    Liver disease Neg Hx     Social History   Tobacco Use   Smoking status: Former   Smokeless tobacco: Never   Tobacco comments:    10 yrs in college  Vaping Use   Vaping Use: Never used  Substance Use Topics   Alcohol use: No    Comment: recovering alcoholic   Drug use: No    Home Medications Prior to Admission medications   Medication Sig Start Date End Date Taking? Authorizing Provider  aspirin EC 81 MG tablet Take 81 mg by mouth at bedtime.    [provider]  Calcium-Phosphorus-Vitamin D (CITRACAL CALCIUM GUMMIES PO) Take by mouth as directed.    [provider]  carbidopa-levodopa (SINEMET IR) 25-100 MG  tablet TAKE 2 TABLETS BY MOUTH 6 (SIX) TIMES DAILY. 02/23/21   Tat, Eustace Quail, DO  dicyclomine (BENTYL) 10 MG capsule TAKE 1 CAPSULE BY MOUTH EVERY 6 HOURS AS NEEDED 02/12/21   Gatha Mayer, MD  melatonin 5 MG TABS Take 5 mg by mouth at bedtime.    [provider]  midodrine (PROAMATINE) 5 MG tablet 2 TABLETS AT 7AM, 1 TABLET AT 11AM, 1 TABLET AT 3PM 03/30/21   Tat, Rebecca S, DO  polyethylene glycol powder (GLYCOLAX/MIRALAX) 17 GM/SCOOP powder Take 17 g by mouth daily.    [provider]  Probiotic Product (PROBIOTIC PO) Take 1 capsule by mouth daily.    [provider]  Propylene Glycol (SYSTANE COMPLETE OP) Apply 1 drop to eye daily.    [provider]  Simethicone (GAS-X PO) Take 1 tablet by mouth. After meals and at bedtime    [provider]  VITAMIN E PO Take 1 capsule by mouth daily.    [provider]    Allergies    Patient has no known allergies.  Review of Systems   Review of Systems  All other systems reviewed and are negative.  Physical Exam Updated Vital Signs BP (!) 192/79    Pulse 78    Temp 98.1 F (36.7 C) (Oral)    Resp 16    Ht 5' 2"  (1.575 m)    Wt 49 kg    SpO2 100%    BMI 19.75 kg/m   Physical Exam Vitals and nursing note reviewed.  Constitutional:      General: She is not in acute distress.    Appearance: Normal appearance. She is well-developed.  HENT:     Head: Normocephalic and atraumatic.  Eyes:     Pupils: Pupils are equal, round, and reactive to light.  Cardiovascular:     Rate and Rhythm: Normal rate and regular rhythm.     Heart sounds: Normal heart sounds. No murmur heard.   No friction rub.  Pulmonary:     Effort: Pulmonary effort is normal.     Breath sounds: Normal breath sounds. No wheezing or rales.  Abdominal:     General: Bowel sounds are normal. There is no distension.     Palpations: Abdomen is soft.     Tenderness: There is no abdominal tenderness. There is no guarding or  rebound.  Musculoskeletal:        General: Tenderness and signs of injury present.     Right shoulder: Swelling, deformity, tenderness and bony tenderness present. Decreased range of motion. Normal pulse.     Right wrist: Tenderness and bony tenderness present. No snuff box tenderness.  Decreased range of motion.       Arms:     Cervical back: Normal range of motion and neck supple. No tenderness.     Right hip: Normal.     Left hip: Normal.     Right knee: Normal.     Left knee: Normal.     Right lower leg: No edema.     Left lower leg: No edema.     Right ankle: Normal.     Left ankle: Normal.     Comments: No edema.  Tenderness noted over the distal radius but no snuffbox tenderness on the right.  Normal MCP joints and no finger swelling or pain.  Skin:    General: Skin is warm and dry.     Findings: No rash.  Neurological:     Mental Status: She is alert and oriented to person, place, and time. Mental status is at baseline.     Cranial Nerves: No cranial nerve deficit.     Comments: Resting tremor noted in the bilateral arms  Psychiatric:        Mood and Affect: Mood normal.        Behavior: Behavior normal.    ED Results / Procedures / Treatments   Labs (all labs ordered are listed, but only abnormal results are displayed) Labs Reviewed - No data to display  EKG None  Radiology DG Shoulder Right  Result Date: 05/09/2021 CLINICAL DATA:  Pain after fall. EXAM: RIGHT SHOULDER - 2+ VIEW; RIGHT HUMERUS - 2+ VIEW COMPARISON:  Chest radiograph 03/15/2021 FINDINGS: Anterior dislocation of the right shoulder with mildly displaced comminuted oblique fracture of the right humerus extending from the greater tuberosity to the surgical neck. The distal fracture fragment is displaced medially approximately 0.7 cm and distally approximately 0.5 cm. No angulation. 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 No fracture of the visualized right ribs. IMPRESSION: Anterior  dislocation of the right shoulder with mildly displaced comminuted oblique fracture of the proximal right humerus. Electronically Signed   By: Ileana Roup M.D.   On: 05/09/2021 18:06   DG Wrist Complete Right  Result Date: 05/09/2021 CLINICAL DATA:  Pain after fall. EXAM: RIGHT WRIST - COMPLETE 3+ VIEW COMPARISON:  None. FINDINGS: There is no acute fracture or dislocation. There is likely a healed ulnar styloid fracture. There is mild degenerative radiocarpal joint space narrowing. There is some soft tissue swelling surrounding the wrist. IMPRESSION: No acute bony abnormality. Electronically Signed   By: Ronney Asters M.D.   On: 05/09/2021 18:02   CT Shoulder Right Wo Contrast  Result Date: 05/09/2021 CLINICAL DATA:  Fall, right shoulder fracture EXAM: CT OF THE UPPER RIGHT EXTREMITY WITHOUT CONTRAST TECHNIQUE: Multidetector CT imaging of the upper right extremity was performed according to the standard protocol. COMPARISON:  None. FINDINGS: Bones/Joint/Cartilage There is a pathologic fracture along the anatomic neck of the right humeral head. The fracture plane extends through the greater tuberosity which appears impacted, best appreciated on axial image # 93/2. Fracture fragments are in grossly anatomic alignment. The fracture plane is along the intramedullary lesion which is poorly circumscribed and demonstrates no internal calcification. No associated abnormal periosteal reaction. The lesion is not well characterized on this examination, however, differential considerations should be led by aggressive lesions such as metastatic disease, multiple myeloma, or lymphoma. The scapula and clavicle appear intact. The visualized right thoracic cage is intact. No additional lytic or blastic bone lesions are identified. Ligaments Suboptimally assessed by CT. Muscles and  Tendons Normal muscle bulk. The long head of the biceps tendon is not well appreciated on this examination. Pectoralis minor tendon appears  intact. Rotator cuff appears intact. Soft tissues Infiltration surrounding the humeral head likely represents interstitial hemorrhage or edema. No mass forming hematoma or fluid collection identified. No abnormal subcutaneous soft tissue mass identified. Visualized right lung is clear save for scarring at the right apex. IMPRESSION: Pathologic fracture along the anatomic neck of the right humeral head with impaction of the greater tuberosity. Poorly circumscribed soft tissue mass within the humeral head along the fracture plane demonstrates aggressive features and differential considerations are as listed above. Contrast enhanced MRI examination is recommended for further characterization. Electronically Signed   By: Fidela Salisbury M.D.   On: 05/09/2021 20:00   DG Humerus Right  Result Date: 05/09/2021 CLINICAL DATA:  Pain after fall. EXAM: RIGHT SHOULDER - 2+ VIEW; RIGHT HUMERUS - 2+ VIEW COMPARISON:  Chest radiograph 03/15/2021 FINDINGS: Anterior dislocation of the right shoulder with mildly displaced comminuted oblique fracture of the right humerus extending from the greater tuberosity to the surgical neck. The distal fracture fragment is displaced medially approximately 0.7 cm and distally approximately 0.5 cm. No angulation. 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 No fracture of the visualized right ribs. IMPRESSION: Anterior dislocation of the right shoulder with mildly displaced comminuted oblique fracture of the proximal right humerus. Electronically Signed   By: Ileana Roup M.D.   On: 05/09/2021 18:06    Procedures Procedures   Medications Ordered in ED Medications  morphine 4 MG/ML injection 4 mg (4 mg Intravenous Given 05/09/21 1755)  ondansetron (ZOFRAN) injection 4 mg (4 mg Intravenous Given 05/09/21 1755)  oxyCODONE-acetaminophen (PERCOCET/ROXICET) 5-325 MG per tablet 1 tablet (1 tablet Oral Given 05/09/21 1909)  morphine 4 MG/ML injection 4 mg (4 mg Intravenous  Given 05/09/21 1910)    ED Course  I have reviewed the triage vital signs and the nursing notes.  Pertinent labs & imaging results that were available during my care of the patient were reviewed by me and considered in my medical decision making (see chart for details).    MDM Rules/Calculators/A&P                         Elderly female presenting today after a fall with concern for right shoulder or humerus injury.  Also pain in the right wrist.  No head injury or loss of consciousness.  Patient does not take anticoagulation.  Neurovascularly intact at this time.  Patient received fentanyl by EMS and reports she does not need anything further at this moment.  Imaging pending.  8:05 PM Patient's wrist imaging is negative.  Patient does have a fracture of her proximal humerus however on some of the images it appears she has an anterior dislocation.  However when speaking with Dr. Lucia Gaskins and by reviewing the images myself not 100% clear the patient's shoulder is dislocated.  He recommended a CT for further evaluation.  Patient given further pain medication and pending CT.  8:05 PM CT shows concern for possible pathologic fracture through the neck of the humerus which is fractured and impacted.  They do recommend an MRI in the future for further evaluation but did not feel that patient needs that emergently tonight.  No evidence of dislocation at this time.  Patient was placed in a shoulder immobilizer.  We will follow-up with orthopedics and at that time can do further imaging.  She  was given pain control for home.     Final Clinical Impression(s) / ED Diagnoses Final diagnoses:  Humeral head fracture, right, closed, initial encounter    Rx / DC Orders ED Discharge Orders          Ordered    oxyCODONE-acetaminophen (PERCOCET/ROXICET) 5-325 MG tablet  Every 6 hours PRN        05/09/21 2027             Blanchie Dessert, MD 05/09/21 2029

## 2021-05-09 NOTE — ED Triage Notes (Signed)
She states that she tripped (denies syncope) whilst ascending stairs at her home just p.t.a. EMS gave pt. 119mcg of Fentanyl I.V. en route to hospital.

## 2021-05-10 ENCOUNTER — Encounter: Payer: Self-pay | Admitting: Family Medicine

## 2021-05-10 DIAGNOSIS — T148XXA Other injury of unspecified body region, initial encounter: Secondary | ICD-10-CM

## 2021-05-13 ENCOUNTER — Ambulatory Visit: Payer: Medicare HMO | Admitting: Family Medicine

## 2021-05-15 ENCOUNTER — Telehealth: Payer: Self-pay | Admitting: Oncology

## 2021-05-15 NOTE — Telephone Encounter (Signed)
Referral received from [place], [place] with [facility] for eval of pathologic fracture of humerus s/p a fall from standing and being seen in the ED and diagnosed with a pathologic fx on 05/09/2021.  The spouse, Mikki Santee, answered the phone, as his wife is not feeling well.  According to the spouse he nor his wife are aware of the reason for referral to oncology by the orthopedist - attempted to explain what was provided in the referral as reason; however, he requested the orthopedist have the conversation with them before they make the oncology/hematology referral.  I did contact [facility] and placed a request for the referring provider, any provider, to contact the patient to explain reason for referral to heme/onc - glad to schedule her to be seen asap once the patient/caregiver are comfortable and understand "the why."

## 2021-05-20 ENCOUNTER — Telehealth: Payer: Self-pay | Admitting: Oncology

## 2021-05-20 NOTE — Telephone Encounter (Signed)
[name] Scheduling Phone Note  I contacted Lindsey Bryant regarding a referral from Lindsey Draft, PA-C [facility]) for purpose of evaluation and work up of pathologic fracture, right humerus.  Lindsey Bryant was aware of her referral and reason.  Information on reason for referral was not necessary.  Initial contact was made with the patient's spouse, Lindsey Bryant, last week who asked they speak with [facility] again to understand why they went from an orthopedic visit to an oncologist visit.  I did contact [facility], and they did reach him by phone - once that was done, I was able to schedule Lindsey Bryant with oncology.  Patient was informed about the [facility] and the intent being to complete a diagnostic/prognostic work-up for her suspicious findings.  She is aware her appointment is with our Physician Assistant to identify a diagnostic plan of care and to arrange further oncologist or specialist care as indicated.  I confirmed with Lindsey Bryant she has transportation to her [facility] appointment.  Patient is aware to bring a list of medications, as well as insurance cards.  Visitor policy and COVID protocol reviewed with patient as well, including what to expect on arrival to the [facility] regarding check-in process, visit, and potential for labs afterwards.  We look forward to meeting Lindsey Bryant and completing her diagnostic work-up and arranging her subsequent appointment with our oncologist team.  Diagnostic Clinic Specific Info:  Best contact/way to contact patient:  Spouse, "Lindsey Bryant [phone] [facility] updated to reflect?:  not applicable Is there a HC POA?:  No  Location of Diagnostic Clinic Appointment and Contact Info Provided to Patient: [place] [facility] [address] [place], [place] [address] [phone]   Reason for Referral, Clinical Information:  05/09/2021 ED Visit status post a fall  from standing.  DG Humerus identifying fracture of the proximal right humerus.  Follow up CT Upper Right Extremity w/o Contrast: IMPRESSION: Pathologic fracture along the anatomic neck of the right humeral head with impaction of the greater tuberosity. Poorly circumscribed soft tissue mass within the humeral head along the fracture plane demonstrates aggressive features and differential considerations are as listed above. Contrast enhanced MRI examination is recommended for further characterization.  According to referral note from [place], [place], the patient cannot have an MRI due to needing to check with cardiology on pacemaker and if she can have an MRI with her pacemaker.

## 2021-05-21 ENCOUNTER — Telehealth: Payer: Self-pay | Admitting: Internal Medicine

## 2021-05-21 NOTE — Telephone Encounter (Signed)
°  1. Has your device fired? no  2. Is you device beeping? no  3. Are you experiencing draining or swelling at device site? no  4. Are you calling to see if we received your device transmission? no  5. Have you passed out? no   Trooper PA with [facility] calling to find out if the patient's device is MRI compatible. Phone: [phone]  Please route to [place]

## 2021-05-22 NOTE — Telephone Encounter (Signed)
LVM for [name] informing her that [name] doesn't have a MRI compatible PPM direct number to device clinic given for questions.

## 2021-05-25 IMAGING — CT CT ABD-PELV W/ CM
2 of 5 series · 16 of 46 positions shown, 18 images · IV contrast (OMNIPAQUE)
Comparison: CT the abdomen and pelvis 08/01/2016.

CLINICAL DATA: 79-year-old female with history of left lower
quadrant abdominal pain, weight loss and tenderness. Diarrhea and
constipation.

EXAM:
CT ABDOMEN AND PELVIS WITH CONTRAST
TECHNIQUE: Multidetector CT imaging of the abdomen and pelvis was performed
using the standard protocol following bolus administration of
intravenous contrast.
CONTRAST:  100mL OMNIPAQUE IOHEXOL 300 MG/ML  SOLN

[Series 2: axial st · axial · 0.69mm/px · z∈[-499,-154]mm · 13 of 79 slices shown, 15 images]
[im 5/79  soft-tissue]
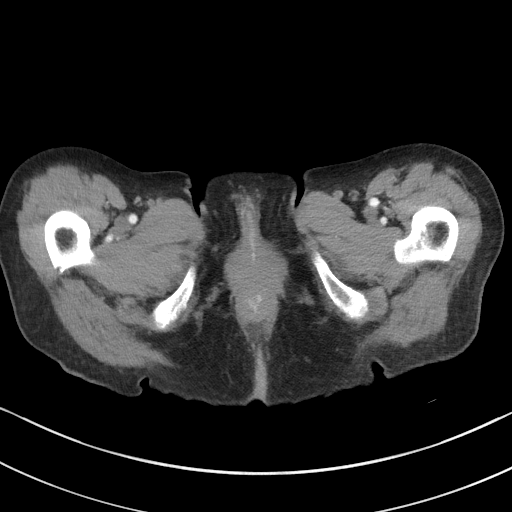
[im 5/79  bone]
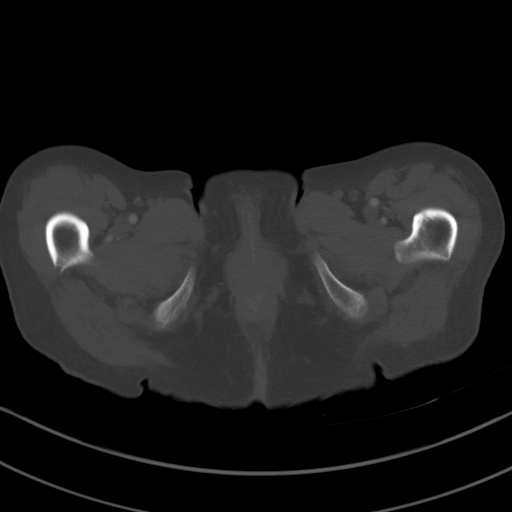
[im 10/79  soft-tissue]
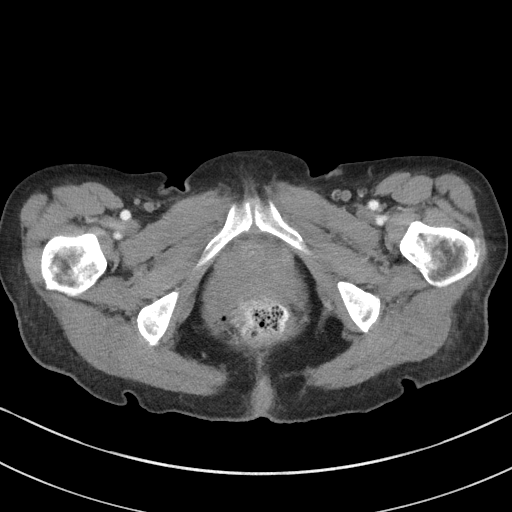
[im 15/79  soft-tissue]
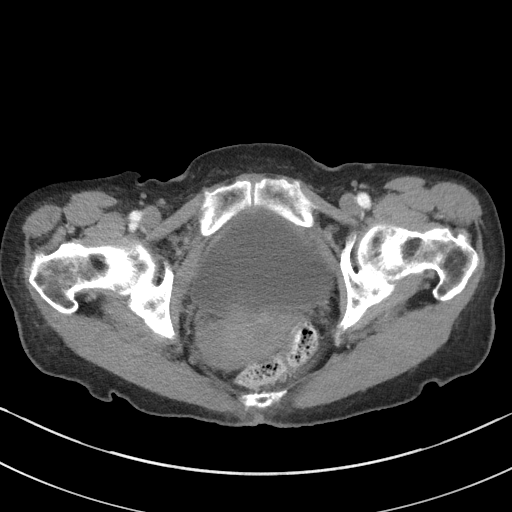
[im 25/79  soft-tissue]
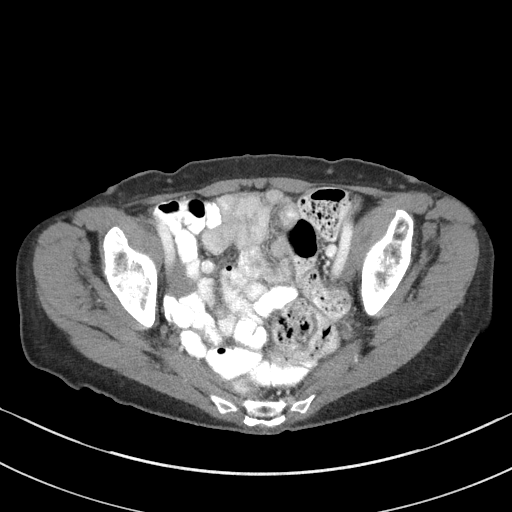
[im 30/79  soft-tissue]
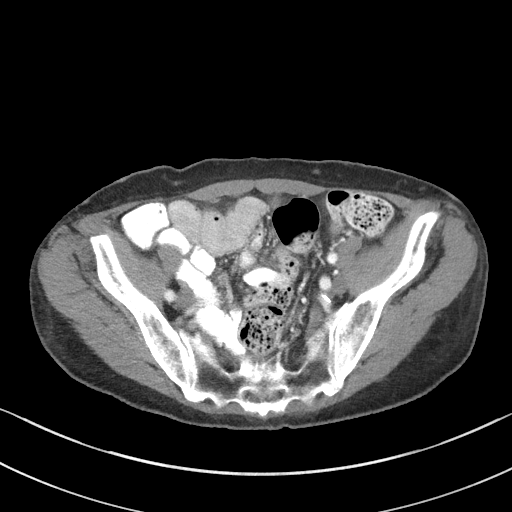
[im 35/79  soft-tissue]
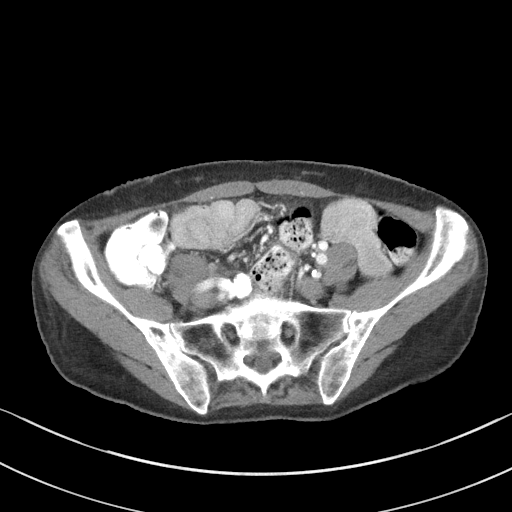
[im 40/79  soft-tissue]
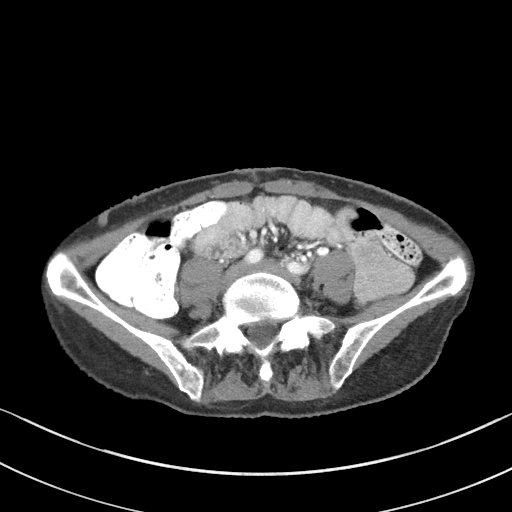
[im 44/79  soft-tissue]
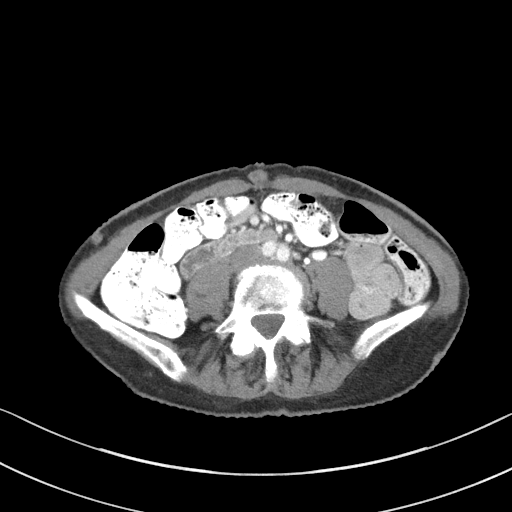
[im 49/79  soft-tissue]
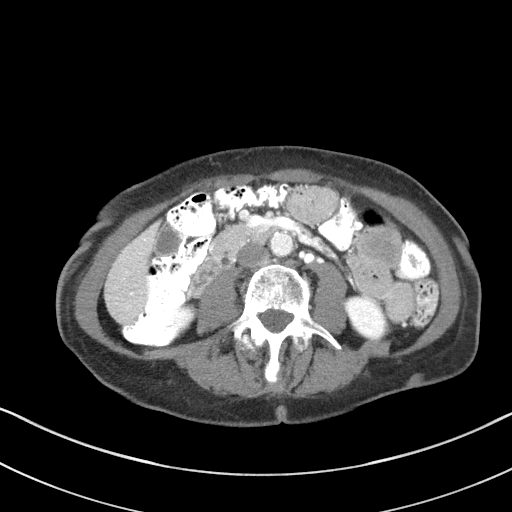
[im 49/79  bone]
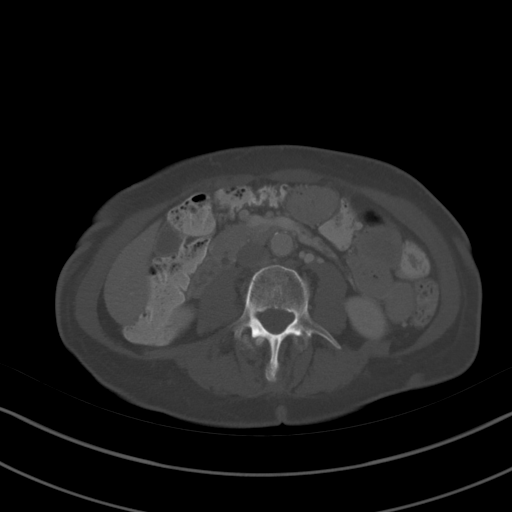
[im 54/79  soft-tissue]
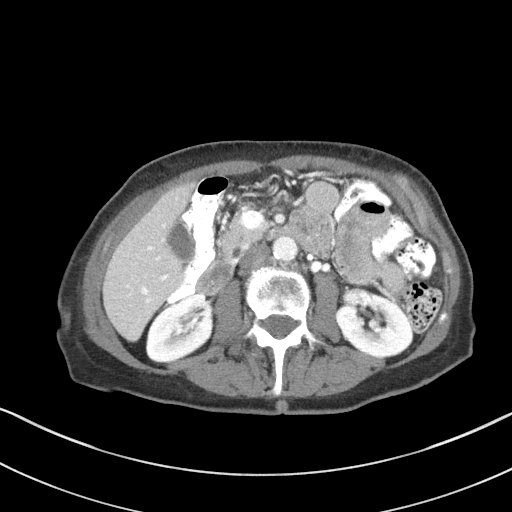
[im 64/79  soft-tissue]
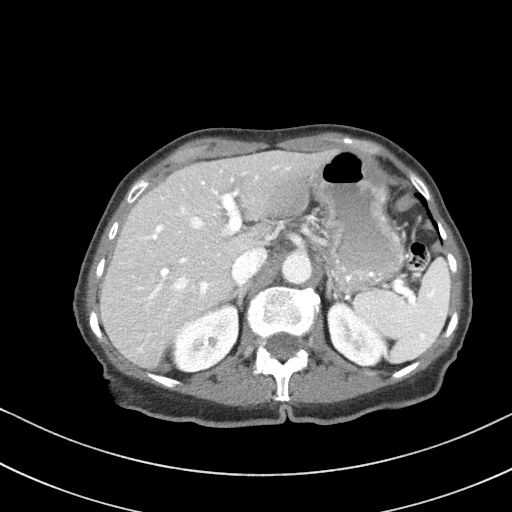
[im 69/79  soft-tissue]
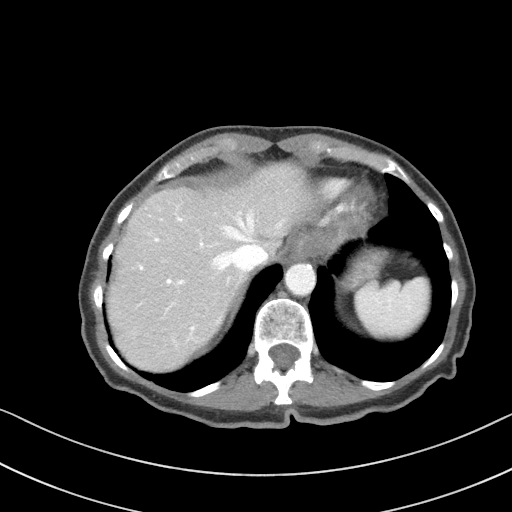
[im 74/79  soft-tissue]
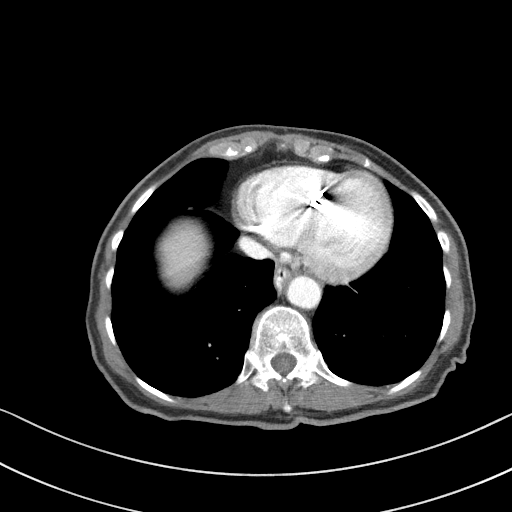

[Series 4: coronal st · coronal · 0.70mm/px · 3 of 72 slices shown]
[im 24/72  soft-tissue]
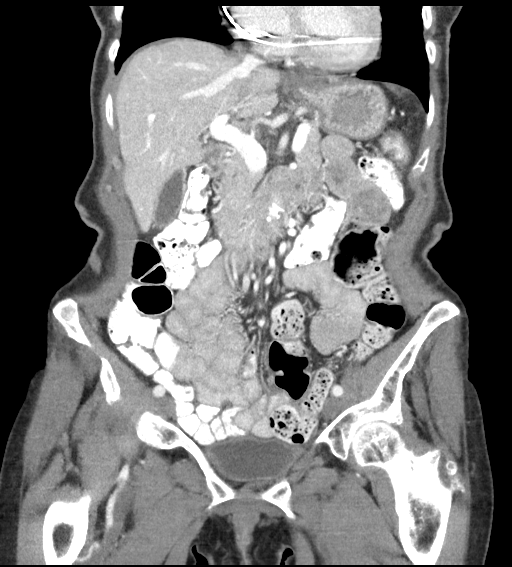
[im 32/72  soft-tissue]
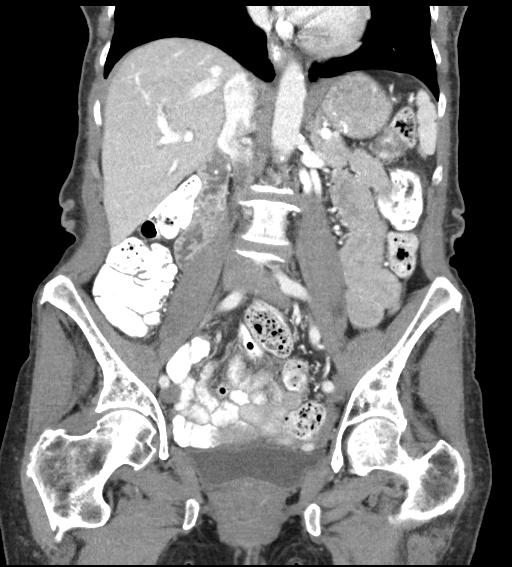
[im 40/72  soft-tissue]
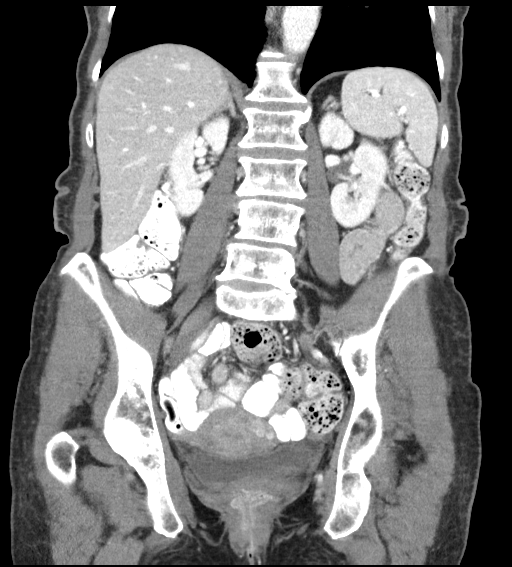

[16 of 46 positions shown; findings below may reference images not displayed]

FINDINGS: Lower chest: Pacemaker lead terminating in the right ventricular
apex. Aortic atherosclerosis.

Hepatobiliary: Subcentimeter low-attenuation lesion in the periphery
of segment 4A of the liver, too small to characterize, but
statistically likely to represent a cyst. No other suspicious
appearing hepatic lesions. No intra or extrahepatic biliary ductal
dilatation. Gallbladder is normal in appearance.

Pancreas: No pancreatic mass. No pancreatic ductal dilatation. No
pancreatic or peripancreatic fluid collections or inflammatory
changes.

Spleen: Unremarkable.

Adrenals/Urinary Tract: Bilateral kidneys and adrenal glands are
normal in appearance. No hydroureteronephrosis. Urinary bladder is
normal in appearance.

Stomach/Bowel: Normal appearance of the stomach. No pathologic
dilatation of small bowel or colon. The appendix is not confidently
identified and may be surgically absent. Regardless, there are no
inflammatory changes noted adjacent to the cecum to suggest the
presence of an acute appendicitis at this time.

Vascular/Lymphatic: Aortic atherosclerosis, without evidence of
aneurysm or dissection in the abdominal or pelvic vasculature. No
lymphadenopathy noted in the abdomen or pelvis.

Reproductive: Small coarsely calcified lesion in the posterior
aspect of the uterine fundus, presumably a tiny fibroid. Ovaries are
atrophic.

Other: No significant volume of ascites.  No pneumoperitoneum.

Musculoskeletal: There are no aggressive appearing lytic or blastic
lesions noted in the visualized portions of the skeleton.
IMPRESSION: 1. No acute findings are noted in the abdomen or pelvis to account
for the patient's symptoms.
2. Aortic atherosclerosis.
3. Additional incidental findings, as above.

## 2021-05-26 ENCOUNTER — Inpatient Hospital Stay: Payer: Medicare HMO | Admitting: Physician Assistant

## 2021-05-26 ENCOUNTER — Inpatient Hospital Stay: Payer: Medicare HMO

## 2021-05-26 ENCOUNTER — Telehealth: Payer: Self-pay | Admitting: Pharmacist

## 2021-05-26 NOTE — Chronic Care Management (AMB) (Signed)
Chronic Care Management Pharmacy Assistant   Name: Lindsey Bryant  MRN: [id number] DOB: 12/27/1940  Reason for Encounter: Disease State / General Assessment Call   Conditions to be addressed/monitored: NSTEMI (non-ST elevated myocardial infarction) [facility])  Recent office visits:  04/28/2021 Lindsey Littler MD - Patient was seen for a physical exam. Discontinued Mirtazapine. No follow up noted.  Recent consult visits:  None  Hospital visits:  Patient was seen at [facility] ED on 05/09/2021 due to Humeral head fracture, right. Discharged after 3 hours.   New?Medications Started at [facility] Discharge:?? -Percocet 5/325 mg - take 1 tablet every 6 hours as needed  Medication Changes at Hospital Discharge: No medication changes.  Medications Discontinued at Hospital Discharge: No medications stopped.   Medications that remain the same after Hospital Discharge:??  -All other medications will remain the same.    Medications: Outpatient Encounter Medications as of 05/26/2021  Medication Sig   aspirin EC 81 MG tablet Take 81 mg by mouth at bedtime.   Calcium-Phosphorus-Vitamin D (CITRACAL CALCIUM GUMMIES PO) Take by mouth as directed.   carbidopa-levodopa (SINEMET IR) 25-100 MG tablet TAKE 2 TABLETS BY MOUTH 6 (SIX) TIMES DAILY.   dicyclomine (BENTYL) 10 MG capsule TAKE 1 CAPSULE BY MOUTH EVERY 6 HOURS AS NEEDED   melatonin 5 MG TABS Take 5 mg by mouth at bedtime.   midodrine (PROAMATINE) 5 MG tablet 2 TABLETS AT 7AM, 1 TABLET AT 11AM, 1 TABLET AT 3PM   oxyCODONE-acetaminophen (PERCOCET/ROXICET) 5-325 MG tablet Take 1 tablet by mouth every 6 (six) hours as needed for severe pain.   polyethylene glycol powder (GLYCOLAX/MIRALAX) 17 GM/SCOOP powder Take 17 g by mouth daily.   Probiotic Product (PROBIOTIC PO) Take 1 capsule by mouth daily.   Propylene Glycol (SYSTANE COMPLETE OP) Apply 1 drop to eye daily.   Simethicone (GAS-X PO) Take 1 tablet by mouth. After meals and at  bedtime   VITAMIN E PO Take 1 capsule by mouth daily.   No facility-administered encounter medications on file as of 05/26/2021.  Fill History: CARBIDOPA-LEVODOPA 25-100 TAB 03/30/2021 90   DICYCLOMINE 10 MG CAPSULE 05/18/2021 15   MIDODRINE 5MG  TAB 05/17/2021 90   Reviewed chart prior to disease state call. Spoke with patient regarding BP  Recent Office Vitals: BP Readings from Last 3 Encounters:  05/09/21 (!) 162/93  04/28/21 120/76  02/04/21 140/76   Pulse Readings from Last 3 Encounters:  05/09/21 74  04/28/21 62  02/04/21 73    Wt Readings from Last 3 Encounters:  05/09/21 108 lb (49 kg)  04/28/21 112 lb 14.4 oz (51.2 kg)  02/04/21 108 lb 9.6 oz (49.3 kg)     Kidney Function Lab Results  Component Value Date/Time   CREATININE 0.71 04/28/2021 01:53 PM   CREATININE 0.73 02/27/2020 11:53 AM   CREATININE 0.78 12/26/2019 02:54 PM   GFR 80.69 04/28/2021 01:53 PM   GFRNONAA 78 01/03/2017 03:15 PM   GFRAA 90 01/03/2017 03:15 PM    BMP Latest Ref Rng & Units 04/28/2021 02/27/2020 12/26/2019  Glucose 70 - 99 mg/dL 96 90 102(H)  BUN 6 - 23 mg/dL 14 10 10   Creatinine 0.40 - 1.20 mg/dL 0.71 0.73 0.78  BUN/Creat Ratio 6 - 22 (calc) - - NOT APPLICABLE  Sodium 681 - 145 mEq/L 135 131(L) 133(L)  Potassium 3.5 - 5.1 mEq/L 4.1 3.8 4.8  Chloride 96 - 112 mEq/L 99 97 98  CO2 19 - 32 mEq/L 31 29 32  Calcium 8.4 -  10.5 mg/dL 9.7 9.6 9.9    Current regimen:  Carbidopa-Levodopa 25/100 mg - take 2 tablets 6 times daily  What provider had you stop the aspirin: Patients husband states they were advised by Dr. Elease Hashimoto to discontinue the aspirin. Patient is unsure as to why.   Any recent hospitalizations or ED visits since last visit with CPP? Yes, ED visit for Humeral head fracture on 05/09/2021.  Adherence Review: Is the patient currently on ACE/ARB medication? No Does the patient have >5 day gap between last estimated fill dates? No   Care Gaps: AWV - completed  10/14/2020 Last BP - 120/76 on 04/28/2021 Covid-19 vaccine - overdue   Star Rating Drugs: None  [place] Pharmacist Assistant [phone]

## 2021-05-27 DIAGNOSIS — M25511 Pain in right shoulder: Secondary | ICD-10-CM | POA: Diagnosis not present

## 2021-05-27 DIAGNOSIS — S42201D Unspecified fracture of upper end of right humerus, subsequent encounter for fracture with routine healing: Secondary | ICD-10-CM | POA: Diagnosis not present

## 2021-05-28 ENCOUNTER — Other Ambulatory Visit: Payer: Self-pay | Admitting: Internal Medicine

## 2021-05-28 NOTE — Progress Notes (Signed)
[place] [phone]   Fax:([phone]) [phone]  INITIAL CONSULTATION:  Patient Care Team: [name], MD as PCP - General (Family Medicine) Deboraha Sprang, MD as PCP - Cardiology (Cardiology) Deboraha Sprang, MD as PCP - Electrophysiology (Cardiology) Sherlyn Lees, MD as Referring Physician (Neurology) Earnie Larsson, [facility] as Pharmacist (Pharmacist) Tat, Eustace Quail, DO as Consulting Physician (Neurology)  CHIEF COMPLAINTS/PURPOSE OF CONSULTATION:  Pathologic fracture through neck of the humerus  HISTORY OF PRESENTING ILLNESS:  Lindsey Bryant 81 y.o. 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 She is accompanied by her husband for this visit.   On review of the previous records, Lindsey Bryant presented to the emergency room on 05/09/2021 after she slip down her front porch and fell. She had severe pain in the right shoulder. CT scan revealed pathologic fracture  through the neck of the humerus with poorly circumscribed soft tissue mass within the head of the humerus.. Patient was placed in a shoulder immobilizer and given pain medication and discharged.   On exam today, Lindsey Bryant reports that her right upper arm pain has  that her energy levels are stable. She continues to complete her daily activities at baseline. She has a good appetite and denies any recent weight changes. She denies nausea, vomiting or abdominal pain. She has intermittent episodes of diarrhea secondary to IBS. She adds that certain foods trigger her diarrhea. She is on bentyl as needed. She denies easy bruising or signs of bleeding. Patient denies fevers, chills, night sweats, shortness of breath, chest pain or cough. She has no other complaints. Rest of the 10 point ROS is below.   MEDICAL HISTORY:  Past Medical History:  Diagnosis Date   Acute systolic heart failure  (HCC)    Bradycardia    a. 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 C[facility] LAB;  Service: Cardiovascular;  Laterality: N/A;   PERMANENT PACEMAKER INSERTION N/A 03/06/2012   Procedure: PERMANENT PACEMAKER INSERTION;  Surgeon: Deboraha Sprang, MD;  Location: [facility] CATH LAB;  Service: Cardiovascular;  Laterality: N/A;   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    SOCIAL HISTORY: Social History   Socioeconomic History   Marital status: Married    Spouse name: Not on file   Number of children: 2   Years of education: Not on file   Highest education level: Not on file  Occupational History   Not on file  Tobacco Use   Smoking status: Former   Smokeless tobacco: Never   Tobacco comments:    Smoked cigarettes for 15  yrs   Vaping Use   Vaping Use: Never used  Substance and Sexual Activity   Alcohol use: No    Comment: recovering alcoholic   Drug use: No   Sexual activity: Not on file  Other Topics Concern   Not on file  Social History Narrative   Retired Runner, broadcasting/film/video, married. 2 daughters I think.   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      Right Handed   Social Determinants of Health   Financial Resource Strain: Not on file  Food Insecurity: No  Food Insecurity   Worried About Programme researcher, broadcasting/film/video in the Last Year: Never true   Ran Out of Food in the Last Year: Never true  Transportation Needs: Not on file  Physical Activity: Sufficiently Active   Days of Exercise per Week: 5 days   Minutes of Exercise per Session: 30 min  Stress: No Stress Concern Present   Feeling of Stress : Not at all  Social Connections: Moderately Isolated   Frequency of Communication with Friends and Family: Twice a week   Frequency of Social Gatherings with Friends and Family: Twice a week   Attends Religious Services: Never   Diplomatic Services operational officer: No   Attends Engineer, structural: Never   Marital Status: Married  Catering manager Violence: Not At Risk   Fear of Current or Ex-Partner: No   Emotionally Abused: No   Physically Abused: No   Sexually Abused: No    FAMILY HISTORY: Family History  Problem Relation Age of Onset   Heart attack Mother 83       Details unclear   Alcohol abuse Father    Breast cancer Sister 23   Diabetes Brother    Hypothyroidism Daughter    Hypothyroidism Daughter    Breast cancer Other    Colon cancer Neg Hx    Esophageal cancer Neg Hx    Stomach cancer Neg Hx    Pancreatic cancer Neg Hx    Liver disease Neg Hx     ALLERGIES:  has No Known Allergies.  MEDICATIONS:  Current Outpatient Medications  Medication Sig Dispense Refill   Calcium-Phosphorus-Vitamin D (CITRACAL CALCIUM GUMMIES PO) Take by mouth as directed.     carbidopa-levodopa (SINEMET IR) 25-100 MG tablet TAKE 2 TABLETS BY MOUTH 6 (SIX) TIMES DAILY. 1080 tablet 0   dicyclomine (BENTYL) 10 MG capsule TAKE 1 CAPSULE BY MOUTH EVERY 6 HOURS AS NEEDED 60 capsule 2   HYDROcodone-acetaminophen (NORCO/VICODIN) 5-325 MG tablet Take 1 tablet by mouth every 4 (four) hours as needed.     melatonin 5 MG TABS Take 5 mg by mouth at bedtime.     midodrine (PROAMATINE) 5 MG tablet 2 TABLETS AT 7AM, 1 TABLET AT 11AM, 1 TABLET AT 3PM 360  tablet 0   polyethylene glycol powder (GLYCOLAX/MIRALAX) 17 GM/SCOOP powder Take 17 g by mouth daily.     Probiotic Product (PROBIOTIC PO) Take 1 capsule by mouth daily.     Propylene Glycol (SYSTANE COMPLETE OP) Apply 1 drop to eye daily.     Simethicone (GAS-X PO) Take 1 tablet by mouth. After meals and at bedtime     VITAMIN E PO Take 1 capsule by mouth daily.     aspirin EC 81 MG tablet Take 81 mg by mouth at bedtime. (Patient not taking: Reported on 05/29/2021)     No current facility-administered medications for this visit.    REVIEW OF SYSTEMS:  Constitutional: ( - ) fevers, ( - )  chills , ( - ) night sweats Eyes: ( - ) blurriness of vision, ( - ) double vision, ( - ) watery eyes Ears, nose, mouth, throat, and face: ( - ) mucositis, ( - ) sore throat Respiratory: ( - ) cough, ( - ) dyspnea, ( - ) wheezes Cardiovascular: ( - ) palpitation, ( - ) chest discomfort, ( - ) lower extremity swelling Gastrointestinal:  ( - ) nausea, ( - ) heartburn, ( - ) change in bowel habits Skin: ( - ) abnormal skin rashes Lymphatics: ( - ) new lymphadenopathy, ( - ) easy bruising Neurological: ( - ) numbness, ( - ) tingling, ( - ) new weaknesses Behavioral/Psych: ( - ) mood change, ( - ) new changes  All other systems were reviewed with the patient and are negative.  PHYSICAL EXAMINATION: ECOG PERFORMANCE STATUS: 1 - Symptomatic but completely ambulatory  Vitals:   05/29/21 1439  BP: 105/86  Pulse: 70  Resp: 18  Temp: (!) 97.5 F (36.4 C)  SpO2: 100%   Filed Weights   05/29/21 1439  Weight: 110 lb 5 oz (50 kg)    GENERAL: well appearing female in NAD  SKIN: skin color, texture, turgor are normal, no rashes or significant lesions EYES: conjunctiva are pink and non-injected, sclera clear OROPHARYNX: no exudate, no erythema; lips, buccal mucosa, and tongue normal  NECK: supple, non-tender LYMPH:  no palpable lymphadenopathy in the cervical or supraclavicular lymph nodes.  LUNGS: clear  to auscultation and percussion with normal breathing effort HEART: regular rate & rhythm and no murmurs and no lower extremity edema ABDOMEN: soft, non-tender, non-distended, normal bowel sounds Musculoskeletal: Right upper extremity in immobilizer. Echinosis noted in right upper extremity.  PSYCH: alert & oriented x 3, fluent speech NEURO: no focal motor/sensory deficits  LABORATORY DATA:  I have reviewed the data as listed CBC Latest Ref Rng & Units 05/29/2021 04/28/2021 02/27/2020  WBC 4.0 - 10.5 K/uL 8.4 5.9 6.1  Hemoglobin 12.0 - 15.0 g/dL 10.8(L) 12.7 12.9  Hematocrit 36.0 - 46.0 % 31.9(L) 38.3 38.2  Platelets 150 - 400 K/uL 282 172.0 138.0(L)    CMP Latest Ref Rng & Units 05/29/2021 04/28/2021 02/27/2020  Glucose 70 - 99 mg/dL 94 96 90  BUN 8 - 23 mg/dL _0 Creatinine 0.44 - 1.00 mg/dL 0.73 0.71 0.73  Sodium 135 - 145 mmol/L 134(L) 135 131(L)  Potassium 3.5 - 5.1 mmol/L 4.1 4.1 3.8  Chloride 98 - 111 mmol/L 99 99 97  CO2 22 - 32 mmol/L _1 Calcium 8.9 - 10.3 mg/dL 9.9 9.7 9.6  Total Protein 6.5 - 8.1 g/dL 6.8 6.6 7.0  Total Bilirubin 0.3 - 1.2 mg/dL 0.7 0.5 0.5  Alkaline Phos 38 - 126 U/L 89 62 48  AST 15 - 41 U/L 11(L) 16 15  ALT 0 - 44 U/L <_2 RADIOGRAPHIC STUDIES: I have personally reviewed the radiological images as listed and agreed with the findings in the report. DG Shoulder Right  Result Date: 05/09/2021 CLINICAL DATA:  Pain after fall. EXAM: RIGHT SHOULDER - 2+ VIEW; RIGHT HUMERUS - 2+ VIEW COMPARISON:  Chest radiograph 03/15/2021 FINDINGS: Anterior dislocation of the right shoulder with mildly displaced comminuted oblique fracture of the right humerus extending from the greater tuberosity to the surgical neck. The distal fracture fragment is displaced medially approximately 0.7 cm and distally approximately 0.5 cm. No angulation.  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 No fracture of the visualized right ribs. IMPRESSION:  Anterior dislocation of the right shoulder with mildly displaced comminuted oblique fracture of the proximal right humerus. Electronically Signed   By: Ileana Roup M.D.   On: 05/09/2021 18:06   DG Wrist Complete Right  Result Date: 05/09/2021 CLINICAL DATA:  Pain after fall. EXAM: RIGHT WRIST - COMPLETE 3+ VIEW COMPARISON:  None. FINDINGS: There is no acute fracture or dislocation. There is likely a healed ulnar styloid fracture. There is mild degenerative radiocarpal joint space narrowing. There is some soft tissue swelling surrounding the wrist. IMPRESSION: No acute bony abnormality. Electronically Signed   By: Ronney Asters M.D.   On: 05/09/2021 18:02   CT Shoulder Right Wo Contrast  Result Date: 05/09/2021 CLINICAL DATA:  Fall, right shoulder fracture EXAM: CT OF THE UPPER RIGHT EXTREMITY WITHOUT CONTRAST TECHNIQUE: Multidetector CT imaging of the upper right extremity was performed according to the standard protocol. COMPARISON:  None. FINDINGS: Bones/Joint/Cartilage There is a pathologic fracture along the anatomic neck of the right humeral head. The fracture plane extends through the greater tuberosity which appears impacted, best appreciated on axial image # 93/2. Fracture fragments are in grossly anatomic alignment. The fracture plane is along the intramedullary lesion which is poorly circumscribed and demonstrates no internal calcification. No associated abnormal periosteal reaction. The lesion is not well characterized on this examination, however, differential considerations should be led by aggressive lesions such as metastatic disease, multiple myeloma, or lymphoma. The scapula and clavicle appear intact. The visualized right thoracic cage is intact. No additional lytic or blastic bone lesions are identified. Ligaments Suboptimally assessed by CT. Muscles and Tendons Normal muscle bulk. The long head of the biceps tendon is not well appreciated on this examination. Pectoralis minor tendon  appears intact. Rotator cuff appears intact. Soft tissues Infiltration surrounding the humeral head likely represents interstitial hemorrhage or edema. No mass forming hematoma or fluid collection identified. No abnormal subcutaneous soft tissue mass identified. Visualized right lung is clear save for scarring at the right apex. IMPRESSION: Pathologic fracture along the anatomic neck of the right humeral head with impaction of the greater tuberosity. Poorly circumscribed soft tissue mass within the humeral head along the fracture plane demonstrates aggressive features and differential considerations are as listed above. Contrast enhanced MRI examination is recommended for further characterization. Electronically Signed   By: Fidela Salisbury M.D.   On: 05/09/2021 20:00   DG Humerus Right  Result Date: 05/09/2021 CLINICAL DATA:  Pain after fall. EXAM: RIGHT SHOULDER - 2+ VIEW; RIGHT HUMERUS - 2+ VIEW COMPARISON:  Chest radiograph 03/15/2021 FINDINGS: Anterior dislocation of the right shoulder with mildly displaced comminuted oblique fracture of the right humerus extending from the greater tuberosity to the surgical neck. The distal fracture fragment is displaced medially approximately 0.7 cm and distally approximately 0.5 cm. No angulation. 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 No fracture of the visualized right ribs. IMPRESSION: Anterior dislocation of the right shoulder with mildly displaced comminuted oblique fracture of the proximal right humerus. Electronically Signed   By: Ileana Roup M.D.   On: 05/09/2021 18:06    ASSESSMENT & PLAN Lindsey Bryant is a 81 y.o. female who presents to the diagnostic clinic for evaluation of pathologic fracture of the right humerus. I reviewed the CT scan from 05/09/2021 and reviewed possible differentials including benign and malignant processes. This includes metastatic disease, myeloproliferative process and lymphoproliferative process.   The  recommendation  is for patient to proceed with initial diagnostic workup with serologic evaluation today. If today's workup is negative, we will discuss if further imaging versus bone biopsy is needed.   #Pathologic fracture of right humerus with soft tissue mass: --Seen on CT scan on 05/09/2021 after presenting to ED due to fall and shoulder injury.  --Takes Norco as needed for pain control.  --Labs today to check CBC, CMP, LDH, SPEP with IFE and serum free light chains.  --If above labs are negative, then need to consider bone biopsy versus additional imaging.  --Return to clinic once workup is complete.   #Age-appropriate screenings: --Most recent mammogram from 2020, due for repeat scan this year that will be arranged by PCP.  --Last colonoscopy from April 2015, no evidence of malignancy. Due to age, patient does not need additional colonoscopies at this time.    Orders Placed This Encounter  Procedures   CBC with Differential ([facility] Only)    Standing Status:   Future    Number of Occurrences:   1    Standing Expiration Date:   05/28/2022   CMP ([facility] only)    Standing Status:   Future    Number of Occurrences:   1    Standing Expiration Date:   05/28/2022   Lactate dehydrogenase (LDH)    Standing Status:   Future    Number of Occurrences:   1    Standing Expiration Date:   05/28/2022   Multiple Myeloma Panel (SPEP&IFE w/QIG)    Standing Status:   Future    Number of Occurrences:   1    Standing Expiration Date:   05/28/2022   Kappa/lambda light chains    Standing Status:   Future    Number of Occurrences:   1    Standing Expiration Date:   05/28/2022    All questions were answered. The patient knows to call the clinic with any problems, questions or concerns.  I have spent a total of 60 minutes minutes of face-to-face and non-face-to-face time, preparing to see the patient, obtaining and/or reviewing separately obtained history, performing a medically appropriate  examination, counseling and educating the patient, ordering tests, documenting clinical information in the electronic health record and care coordination.   Dede Query, PA-C Department of Hematology/Oncology [place] at [facility] Phone: [phone]

## 2021-05-29 ENCOUNTER — Other Ambulatory Visit: Payer: Self-pay | Admitting: Internal Medicine

## 2021-05-29 ENCOUNTER — Inpatient Hospital Stay: Payer: Medicare HMO | Attending: Physician Assistant

## 2021-05-29 ENCOUNTER — Other Ambulatory Visit: Payer: Self-pay

## 2021-05-29 ENCOUNTER — Inpatient Hospital Stay: Payer: Medicare HMO | Admitting: Physician Assistant

## 2021-05-29 VITALS — BP 105/86 | HR 70 | Temp 97.5°F | Resp 18 | Wt 110.3 lb

## 2021-05-29 DIAGNOSIS — W19XXXA Unspecified fall, initial encounter: Secondary | ICD-10-CM | POA: Diagnosis not present

## 2021-05-29 DIAGNOSIS — M84421A Pathological fracture, right humerus, initial encounter for fracture: Secondary | ICD-10-CM | POA: Insufficient documentation

## 2021-05-29 DIAGNOSIS — Z803 Family history of malignant neoplasm of breast: Secondary | ICD-10-CM | POA: Insufficient documentation

## 2021-05-29 DIAGNOSIS — M8440XD Pathological fracture, unspecified site, subsequent encounter for fracture with routine healing: Secondary | ICD-10-CM

## 2021-05-29 DIAGNOSIS — Z85820 Personal history of malignant melanoma of skin: Secondary | ICD-10-CM

## 2021-05-29 DIAGNOSIS — Z87891 Personal history of nicotine dependence: Secondary | ICD-10-CM | POA: Insufficient documentation

## 2021-05-29 LAB — CMP (CANCER CENTER ONLY)
ALT: <5 U/L (ref 0–44)
AST: 11 U/L — ABNORMAL LOW (ref 15–41)
Albumin: 4.1 g/dL (ref 3.5–5.0)
Alkaline Phosphatase: 89 U/L (ref 38–126)
Anion gap: 6 (ref 5–15)
BUN: 14 mg/dL (ref 8–23)
CO2: 29 mmol/L (ref 22–32)
Calcium: 9.9 mg/dL (ref 8.9–10.3)
Chloride: 99 mmol/L (ref 98–111)
Creatinine: 0.73 mg/dL (ref 0.44–1.00)
GFR, Estimated: >60 mL/min (ref >60)
Glucose, Bld: 94 mg/dL (ref 70–99)
Potassium: 4.1 mmol/L (ref 3.5–5.1)
Sodium: 134 mmol/L — ABNORMAL LOW (ref 135–145)
Total Bilirubin: 0.7 mg/dL (ref 0.3–1.2)
Total Protein: 6.8 g/dL (ref 6.5–8.1)

## 2021-05-29 LAB — CBC WITH DIFFERENTIAL (CANCER CENTER ONLY)
Abs Immature Granulocytes: 0.03 10*3/uL (ref 0.00–0.07)
Basophils Absolute: 0 10*3/uL (ref 0.0–0.1)
Basophils Relative: 1 %
Eosinophils Absolute: 0.1 10*3/uL (ref 0.0–0.5)
Eosinophils Relative: 1 %
HCT: 31.9 % — ABNORMAL LOW (ref 36.0–46.0)
Hemoglobin: 10.8 g/dL — ABNORMAL LOW (ref 12.0–15.0)
Immature Granulocytes: 0 %
Lymphocytes Relative: 14 %
Lymphs Abs: 1.1 10*3/uL (ref 0.7–4.0)
MCH: 34.7 pg — ABNORMAL HIGH (ref 26.0–34.0)
MCHC: 33.9 g/dL (ref 30.0–36.0)
MCV: 102.6 fL — ABNORMAL HIGH (ref 80.0–100.0)
Monocytes Absolute: 0.6 10*3/uL (ref 0.1–1.0)
Monocytes Relative: 7 %
Neutro Abs: 6.5 10*3/uL (ref 1.7–7.7)
Neutrophils Relative %: 77 %
Platelet Count: 282 10*3/uL (ref 150–400)
RBC: 3.11 MIL/uL — ABNORMAL LOW (ref 3.87–5.11)
RDW: 13.3 % (ref 11.5–15.5)
WBC Count: 8.4 10*3/uL (ref 4.0–10.5)
nRBC: 0 % (ref 0.0–0.2)

## 2021-05-29 LAB — LACTATE DEHYDROGENASE: LDH: 149 U/L (ref 98–192)

## 2021-05-31 ENCOUNTER — Encounter: Payer: Self-pay | Admitting: Physician Assistant

## 2021-05-31 DIAGNOSIS — M84421A Pathological fracture, right humerus, initial encounter for fracture: Secondary | ICD-10-CM | POA: Insufficient documentation

## 2021-06-01 LAB — KAPPA/LAMBDA LIGHT CHAINS
Kappa free light chain: 22.4 mg/L — ABNORMAL HIGH (ref 3.3–19.4)
Kappa, lambda light chain ratio: 1.54 (ref 0.26–1.65)
Lambda free light chains: 14.5 mg/L (ref 5.7–26.3)

## 2021-06-01 NOTE — Progress Notes (Signed)
Assessment/Plan:   1.  Parkinsons Disease, sx's since 2006  -continue carbidopa/levodopa 25/100, 2 po 6 times per day (6/9/noon/3pm/6pm/9pm)  -Clinically, she does look worse, but it is likely because of her fall with pathologic fracture of the right humerus and she has not been able to exercise.  She cannot do physical therapy right now because of the arm in a sling.  Told her to call me and let me know when she is cleared to do so and we will send her for physical therapy.    2.  Neurogenic Orthostatic Hypotension  -Continue midodrine, 5 mg, 2 in the AM, 1 in the afternoon and evening  3.  Abdominal pain with hx of IBS (constipation and diarrhea)  -sees Dr. [name]  -discussed anticholinergic s/e with bentyl.  She is only using occasionally.  4.  Few episodes MS change  -suspect due to Neurogenic Orthostatic Hypotension  5.  Fall with pathologic fracture of the right humerus  -Patient is now following with oncology to make sure that pathologic fracture is not caused by metastatic disease/hematologic disease.  6.  Insomnia and weight loss  -add mirtazapine 7.5 mg q hs.  May not be a high enough dose but she wants to start lower dose. Subjective:   Lindsey Bryant was seen today in follow up for Parkinsons disease.  My previous records were reviewed prior to todays visit as well as outside records available to me.  Patient with spouse who supplements the history.  Patient was in the emergency room December 17 after a fall.  She had a right humeral head fracture.  Patient fell outside and fell into the steps and landed on the shoulder.  She has been following with [facility] for that.  No surgery was required.  However, when they did a CT to better look at the fracture, it was felt that this is a pathologic fracture which could possibly be caused by metastatic disease, myeloma, lymphoma.  She subsequently saw oncology PA.  Lab work completed and she was told to follow-up after the lab was  done.  Her myeloma panel is still pending.  No other falls.  No hallucinations until she had the oxycontin for the pain (not on any anylonger).  BP been good since going up on the midodrine.  Having trouble staying asleep.  Wakes up and goes to the RR and can't get back to sleep.  On melatonin 5 mg at bed.    Current prescribed movement disorder medications:  Carbidopa/levodopa 25/100, 2 tablets 6 times per day (6:30am, 9:30am, 12:30pm, 3:30, 6:30, 10:30pm)  Midodrine, 5 mg,2 in the morning, 1 in the afternoon and 1 in the evening, increased last visit    ALLERGIES:  No Known Allergies  CURRENT MEDICATIONS:  Outpatient Encounter Medications as of 06/02/2021  Medication Sig   Calcium-Phosphorus-Vitamin D (CITRACAL CALCIUM GUMMIES PO) Take by mouth as directed.   carbidopa-levodopa (SINEMET IR) 25-100 MG tablet TAKE 2 TABLETS BY MOUTH 6 (SIX) TIMES DAILY.   dicyclomine (BENTYL) 10 MG capsule TAKE 1 CAPSULE BY MOUTH EVERY 6 HOURS AS NEEDED   HYDROcodone-acetaminophen (NORCO/VICODIN) 5-325 MG tablet Take 1 tablet by mouth every 4 (four) hours as needed.   melatonin 5 MG TABS Take 5 mg by mouth at bedtime.   midodrine (PROAMATINE) 5 MG tablet 2 TABLETS AT 7AM, 1 TABLET AT 11AM, 1 TABLET AT 3PM   polyethylene glycol powder (GLYCOLAX/MIRALAX) 17 GM/SCOOP powder Take 17 g by mouth daily.   Probiotic  Product (PROBIOTIC PO) Take 1 capsule by mouth daily.   Propylene Glycol (SYSTANE COMPLETE OP) Apply 1 drop to eye daily.   Simethicone (GAS-X PO) Take 1 tablet by mouth. After meals and at bedtime   VITAMIN E PO Take 1 capsule by mouth daily.   No facility-administered encounter medications on file as of 06/02/2021.    Objective:   PHYSICAL EXAMINATION:    VITALS:   Vitals:   06/02/21 1520  BP: 136/82  Pulse: 83  SpO2: 96%  Weight: 110 lb 6.4 oz (50.1 kg)  Height: 5\' 3"  (1.6 m)    No data found.    GEN:  The patient appears stated age and is in NAD. HEENT:  Normocephalic,  atraumatic.  The mucous membranes are moist. The superficial temporal arteries are without ropiness or tenderness. CV:  RRR Lungs:  CTAB Neck/HEME:  There are no carotid bruits bilaterally.  Neurological examination:  Orientation: The patient is alert and oriented x3. Cranial nerves: There is good facial symmetry with mild facial hypomimia. The speech is fluent and clear. Soft palate rises symmetrically and there is no tongue deviation. Hearing is intact to conversational tone. Sensation: Sensation is intact to light touch throughout Motor: Strength is at least antigravity x4.  The right arm is in a sling and the skin of the right arm has pretty significant ecchymosis in various stages of healing, mostly yellow and green.  Movement examination: Tone: There is nl tone in the ue/le Abnormal movements: there is RUE/RLE rest tremor Coordination:  There is decremation with finger and toe taps on the right.  Not able to do all rapid alternating movements on the right, as the right arm is in a sling. Gait and Station: The patient is slow to rise out of the chair. The patient's stride length is decreased and she is shuffling/short stepped.    She is not particularly stable today.  I have reviewed and interpreted the following labs independently    Chemistry      Component Value Date/Time   NA 134 (L) 05/29/2021 1514   NA 141 01/03/2017 1515   K 4.1 05/29/2021 1514   CL 99 05/29/2021 1514   CO2 29 05/29/2021 1514   BUN 14 05/29/2021 1514   BUN 13 01/03/2017 1515   CREATININE 0.73 05/29/2021 1514   CREATININE 0.78 12/26/2019 1454      Component Value Date/Time   CALCIUM 9.9 05/29/2021 1514   ALKPHOS 89 05/29/2021 1514   AST 11 (L) 05/29/2021 1514   ALT <5 05/29/2021 1514   BILITOT 0.7 05/29/2021 1514       Lab Results  Component Value Date   WBC 8.4 05/29/2021   HGB 10.8 (L) 05/29/2021   HCT 31.9 (L) 05/29/2021   MCV 102.6 (H) 05/29/2021   PLT 282 05/29/2021    Lab Results   Component Value Date   TSH 1.22 04/28/2021     Total time spent on today's visit was 32 minutes, including both face-to-face time and nonface-to-face time.  Time included that spent on review of records (prior notes available to me/labs/imaging if pertinent), discussing treatment and goals, answering patient's questions and coordinating care.  Cc:  [name], MD

## 2021-06-02 ENCOUNTER — Encounter: Payer: Self-pay | Admitting: Neurology

## 2021-06-02 ENCOUNTER — Ambulatory Visit: Payer: Medicare HMO | Admitting: Neurology

## 2021-06-02 ENCOUNTER — Other Ambulatory Visit: Payer: Self-pay

## 2021-06-02 VITALS — BP 136/82 | HR 83 | Ht 63.0 in | Wt 110.4 lb

## 2021-06-02 DIAGNOSIS — G20 Parkinson's disease: Secondary | ICD-10-CM

## 2021-06-02 DIAGNOSIS — G903 Multi-system degeneration of the autonomic nervous system: Secondary | ICD-10-CM

## 2021-06-02 MED ORDER — MIRTAZAPINE 7.5 MG PO TABS: 7.5000 mg | ORAL_TABLET | Freq: Every day | ORAL | 5 refills | Status: DC

## 2021-06-02 NOTE — Patient Instructions (Signed)
Start mirtazapine 7.5 mg at bed.  Trial it for a month and if not helping, we can increase the dosage.  Let us know if you want physical therapy referral after your arm is a bit better.  The physicians and staff at [facility] are committed to providing excellent care. You may receive a survey requesting feedback about your experience at our office. We strive to receive "very good" responses to the survey questions. If you feel that your experience would prevent you from giving the office a "very good " response, please contact our office to try to remedy the situation. We may be reached at [phone]. Thank you for taking the time out of your busy day to complete the survey.

## 2021-06-05 LAB — MULTIPLE MYELOMA PANEL, SERUM
Albumin SerPl Elph-Mcnc: 3.7 g/dL (ref 2.9–4.4)
Albumin/Glob SerPl: 1.4 (ref 0.7–1.7)
Alpha 1: 0.3 g/dL (ref 0.0–0.4)
Alpha2 Glob SerPl Elph-Mcnc: 0.7 g/dL (ref 0.4–1.0)
B-Globulin SerPl Elph-Mcnc: 0.9 g/dL (ref 0.7–1.3)
Gamma Glob SerPl Elph-Mcnc: 0.8 g/dL (ref 0.4–1.8)
Globulin, Total: 2.7 g/dL (ref 2.2–3.9)
IgA: 198 mg/dL (ref 64–422)
IgG (Immunoglobin G), Serum: 835 mg/dL (ref 586–1602)
IgM (Immunoglobulin M), Srm: 131 mg/dL (ref 26–217)
Total Protein ELP: 6.4 g/dL (ref 6.0–8.5)

## 2021-06-08 ENCOUNTER — Telehealth: Payer: Self-pay | Admitting: Physician Assistant

## 2021-06-08 ENCOUNTER — Other Ambulatory Visit: Payer: Self-pay | Admitting: Physician Assistant

## 2021-06-08 DIAGNOSIS — M84421A Pathological fracture, right humerus, initial encounter for fracture: Secondary | ICD-10-CM

## 2021-06-08 NOTE — Telephone Encounter (Signed)
I called and spoke to the patient, Ms. Ploch, and her husband. I reviewed the lab results from 05/29/2021. Findings don't suggest plasma cell disorders including multiple myeloma. CBC did show evidence of macrocytic anemia.   I discussed next steps which includes biopsy of intramedullary lesion of the right humerus. Patient refused a biopsy. I offered a CT scan of the chest, abdomen and pelvis to see if there are any other lesions concerning for malignancy and can be amenable for biopsy. Patient refused the CT scan as well. She reports that she feels that the fracture was due to the fall she had.   I explained that  along with the fracture, there is an intramedullary lesion that is seen on the CT scan from 05/09/2021.Our goal for the additional CT imaging and biopsy is to rule out cancer as the underlying cause of the noted lesion.   Patient expressed understanding but again refused any further workup. I offered additional serologic workup to evaluate cause of macrocytic anemia. Patient would like to think about pursuing additional blood for the anemia. I will plan to call her back by the end of the week to follow up about additional blood work.   Patient and her husband are aware to reach out to me at [phone] if they change their mind on pursuing more workup. I will update patient's PCP, Dr. Elease Hashimoto, regarding our conversation today.

## 2021-06-09 DIAGNOSIS — M25511 Pain in right shoulder: Secondary | ICD-10-CM | POA: Diagnosis not present

## 2021-06-12 ENCOUNTER — Ambulatory Visit (INDEPENDENT_AMBULATORY_CARE_PROVIDER_SITE_OTHER): Payer: Medicare HMO | Admitting: Family Medicine

## 2021-06-12 VITALS — BP 124/62 | HR 73 | Temp 97.6°F | Wt 110.5 lb

## 2021-06-12 DIAGNOSIS — D539 Nutritional anemia, unspecified: Secondary | ICD-10-CM | POA: Diagnosis not present

## 2021-06-12 DIAGNOSIS — M84421A Pathological fracture, right humerus, initial encounter for fracture: Secondary | ICD-10-CM

## 2021-06-12 LAB — VITAMIN B12: Vitamin B-12: 620 pg/mL (ref 211–911)

## 2021-06-12 LAB — FOLATE: Folate: 20.5 ng/mL (ref >5.9)

## 2021-06-12 NOTE — Progress Notes (Signed)
Established Patient Office Visit  Subjective:  Patient ID: Lindsey Bryant, female    DOB: 20-Apr-1941  Age: 81 y.o. MRN: [id number]  CC:  Chief Complaint  Patient presents with   Follow-up    HPI [name] presents to discuss recent right humerus fracture which was characterized as pathologic fracture.  See below.  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  She presented to the ER following a fall on 05-09-2021.  She was going down some steps when she slid down 1 step and landed on her right shoulder.  CT scan showed pathologic fracture along the anatomic neck of the right humeral head with impaction of the greater tuberosity.  Comment of "poorly circumscribed soft tissue mass within the humeral head along the fracture plane demonstrates aggressive features" with differential including metastatic disease, multiple myeloma, or lymphoma.  No additional lytic or blastic bone lesions were identified.  Patient referred to oncology and had further diagnostic work-up on January 6.  She had multiple labs including CBC, CMP, LDH, SPEP with immunofixation and serum free light chains.  Results of lab work did not suggest plasma cell disorder but she did have some macrocytic anemia from CBC.  Recommendations were to get CT chest, abdomen, and pelvis and biopsy of intramedullary lesion of the right humerus.  Patient refused all of those.  There was also recommendation to further evaluate her macrocytic anemia and she initially declined that as well.  She cannot get MRI secondary to pacemaker.  She is having less pain in the right humerus.  Still has some swelling.  She has is in a sling still and is followed by orthopedics.  Last mammogram 2020.  Last colonoscopy 2015.  Past Medical History:  Diagnosis Date   Acute systolic heart failure (HCC)    Bradycardia    a. 02/2012   Hyperglycemia    IBS  (irritable bowel syndrome)    Melanoma ([place])    right arm   NSTEMI (non-ST elevated myocardial infarction) ([place])    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/2012   Parkinson's disease     Past Surgical History:  Procedure Laterality Date   APPENDECTOMY  1952   COLONOSCOPY     EYE SURGERY  07/2017   Cataract Sx   LEFT HEART CATH AND CORONARY ANGIOGRAPHY N/A 08/03/2016   Procedure: Left Heart Cath and Coronary Angiography;  Surgeon: Leonie Man, MD;  Location: [place] [facility];  Service: Cardiovascular;  Laterality: N/A;   MELANOMA EXCISION Right 2000   right arm   PACEMAKER PLACEMENT  05/2012   PACEMAKER REVISION N/A 03/14/2012   Procedure: PACEMAKER REVISION;  Surgeon: Thompson Grayer, MD;  Location: [facility] CATH LAB;  Service: Cardiovascular;  Laterality: N/A;   PERMANENT PACEMAKER INSERTION N/A 03/06/2012   Procedure: PERMANENT PACEMAKER INSERTION;  Surgeon: Deboraha Sprang, MD;  Location: [facility] CATH LAB;  Service: Cardiovascular;  Laterality: N/A;   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    Family History  Problem Relation Age of Onset   Heart attack Mother 28       Details unclear   Alcohol abuse Father    Breast cancer Sister 76   Diabetes Brother    Hypothyroidism Daughter    Hypothyroidism Daughter    Breast cancer Other    Colon cancer Neg Hx    Esophageal cancer Neg Hx    Stomach cancer Neg  Hx    Pancreatic cancer Neg Hx    Liver disease Neg Hx     Social History   Socioeconomic History   Marital status: Married    Spouse name: Not on file   Number of children: 2   Years of education: Not on file   Highest education level: Not on file  Occupational History   Not on file  Tobacco Use   Smoking status: Former   Smokeless tobacco: Never   Tobacco comments:    Smoked cigarettes for 15 yrs   Vaping Use   Vaping Use: Never used  Substance and Sexual Activity   Alcohol use: No    Comment: recovering alcoholic   Drug use: No   Sexual activity: Not on  file  Other Topics Concern   Not on file  Social History Narrative   Retired Pharmacist, hospital, married. 2 daughters I think.   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      Right Handed   Social Determinants of Health   Financial Resource Strain: Not on file  Food Insecurity: No Food Insecurity   Worried About Charity fundraiser in the Last Year: Never true   Ran Out of Food in the Last Year: Never true  Transportation Needs: Not on file  Physical Activity: Sufficiently Active   Days of Exercise per Week: 5 days   Minutes of Exercise per Session: 30 min  Stress: No Stress Concern Present   Feeling of Stress : Not at all  Social Connections: Moderately Isolated   Frequency of Communication with Friends and Family: Twice a week   Frequency of Social Gatherings with Friends and Family: Twice a week   Attends Religious Services: Never   Marine scientist or Organizations: No   Attends Music therapist: Never   Marital Status: Married  Human resources officer Violence: Not At Risk   Fear of Current or Ex-Partner: No   Emotionally Abused: No   Physically Abused: No   Sexually Abused: No    Outpatient Medications Prior to Visit  Medication Sig Dispense Refill   Calcium-Phosphorus-Vitamin D (CITRACAL CALCIUM GUMMIES PO) Take by mouth as directed.     carbidopa-levodopa (SINEMET IR) 25-100 MG tablet TAKE 2 TABLETS BY MOUTH 6 (SIX) TIMES DAILY. 1080 tablet 0   dicyclomine (BENTYL) 10 MG capsule TAKE 1 CAPSULE BY MOUTH EVERY 6 HOURS AS NEEDED 60 capsule 2   HYDROcodone-acetaminophen (NORCO/VICODIN) 5-325 MG tablet Take 1 tablet by mouth every 4 (four) hours as needed.     melatonin 5 MG TABS Take 5 mg by mouth at bedtime.     midodrine (PROAMATINE) 5 MG tablet 2 TABLETS AT 7AM, 1 TABLET AT 11AM, 1 TABLET AT 3PM 360 tablet 0   mirtazapine (REMERON) 7.5 MG tablet Take 1 tablet (7.5 mg total) by mouth at bedtime. 30 tablet 5   polyethylene glycol powder (GLYCOLAX/MIRALAX) 17  GM/SCOOP powder Take 17 g by mouth daily.     Probiotic Product (PROBIOTIC PO) Take 1 capsule by mouth daily.     Propylene Glycol (SYSTANE COMPLETE OP) Apply 1 drop to eye daily.     Simethicone (GAS-X PO) Take 1 tablet by mouth. After meals and at bedtime     VITAMIN E PO Take 1 capsule by mouth daily.     No facility-administered medications prior to visit.    No Known Allergies  ROS Review of Systems  Constitutional:  Negative for chills and fever.  HENT:  Negative for trouble swallowing.   Respiratory:  Negative for cough and shortness of breath.   Cardiovascular:  Negative for chest pain.  Gastrointestinal:  Negative for abdominal pain and blood in stool.  Genitourinary:  Negative for hematuria.  Neurological:  Negative for headaches.  Hematological:  Negative for adenopathy.  Psychiatric/Behavioral:  Negative for confusion.      Objective:    Physical Exam Vitals reviewed.  Cardiovascular:     Rate and Rhythm: Normal rate and regular rhythm.  Pulmonary:     Effort: Pulmonary effort is normal.     Breath sounds: Normal breath sounds.  Musculoskeletal:     Comments: She still has extensive bruising right upper extremity from recent fracture.  Arm is in a sling.  Does have some continued localized swelling right proximal humerus region.  Neurological:     Mental Status: She is alert.    BP 124/62 (BP Location: Left Arm, Patient Position: Sitting, Cuff Size: Normal)    Pulse 73    Temp 97.6 F (36.4 C) (Oral)    Wt 110 lb 8 oz (50.1 kg)    SpO2 94%    BMI 19.57 kg/m  Wt Readings from Last 3 Encounters:  06/12/21 110 lb 8 oz (50.1 kg)  06/02/21 110 lb 6.4 oz (50.1 kg)  05/29/21 110 lb 5 oz (50 kg)     Health Maintenance Due  Topic Date Due   COVID-19 Vaccine (4 - Booster) 04/28/2021   TETANUS/TDAP  06/03/2021    There are no preventive care reminders to display for this patient.  Lab Results  Component Value Date   TSH 1.22 04/28/2021   Lab Results   Component Value Date   WBC 8.4 05/29/2021   HGB 10.8 (L) 05/29/2021   HCT 31.9 (L) 05/29/2021   MCV 102.6 (H) 05/29/2021   PLT 282 05/29/2021   Lab Results  Component Value Date   NA 134 (L) 05/29/2021   K 4.1 05/29/2021   CO2 29 05/29/2021   GLUCOSE 94 05/29/2021   BUN 14 05/29/2021   CREATININE 0.73 05/29/2021   BILITOT 0.7 05/29/2021   ALKPHOS 89 05/29/2021   AST 11 (L) 05/29/2021   ALT <5 05/29/2021   PROT 6.8 05/29/2021   ALBUMIN 4.1 05/29/2021   CALCIUM 9.9 05/29/2021   ANIONGAP 6 05/29/2021   GFR 80.69 04/28/2021   Lab Results  Component Value Date   CHOL 198 04/28/2021   Lab Results  Component Value Date   HDL 83.80 04/28/2021   Lab Results  Component Value Date   LDLCALC 105 (H) 04/28/2021   Lab Results  Component Value Date   TRIG 49.0 04/28/2021   Lab Results  Component Value Date   CHOLHDL 2 04/28/2021   Lab Results  Component Value Date   HGBA1C 5.3 07/29/2016      Assessment & Plan:   #1 recent pathologic fracture right humerus.  Pain improving.  She is followed by orthopedics. As noted above, intramedullary lesion with concern for malignancy.  Recent evaluation did not show evidence for plasma cell cytoma.  Other differential including metastatic disease or lymphoma.  -Patient initially declined further evaluation including CT-guided biopsy and CT chest, abdomen, pelvis.  We had a very long discussion today with patient and her husband and tried to answer questions as best we could.  We strongly advocated that she proceed with getting further evaluation so she can know her treatment options.  She is aware that without further evaluation delays in  treatment could lead to further complications including death.  We also explained the limitation that basically no treatment could be offered without tissue diagnosis to know how to treat.    after much discussion, they do agree to follow-up with oncology.  Patient is still somewhat equivocal regarding  whether she would allow biopsy but is receptive to getting further scans and has not ruled out getting biopsy.  Her husband is encouraging her along these lines to consider the biopsy. -She does need follow-up mammogram.  #2 macrocytic anemia.  Check B12 and folate levels today   No orders of the defined types were placed in this encounter.   Follow-up: No follow-ups on file.    [name], MD

## 2021-06-16 ENCOUNTER — Other Ambulatory Visit: Payer: Self-pay | Admitting: Physician Assistant

## 2021-06-16 DIAGNOSIS — M84421A Pathological fracture, right humerus, initial encounter for fracture: Secondary | ICD-10-CM

## 2021-06-22 ENCOUNTER — Other Ambulatory Visit: Payer: Self-pay | Admitting: Physician Assistant

## 2021-06-22 ENCOUNTER — Telehealth: Payer: Self-pay

## 2021-06-22 DIAGNOSIS — D539 Nutritional anemia, unspecified: Secondary | ICD-10-CM

## 2021-06-22 DIAGNOSIS — S42201D Unspecified fracture of upper end of right humerus, subsequent encounter for fracture with routine healing: Secondary | ICD-10-CM | POA: Diagnosis not present

## 2021-06-22 DIAGNOSIS — M25511 Pain in right shoulder: Secondary | ICD-10-CM | POA: Diagnosis not present

## 2021-06-22 NOTE — Telephone Encounter (Signed)
T/C to pt to touch base with herbefore I scheduled the CT scan of c/a/p. Spoke with Mr Holsclaw and they are still reluctant to schedule the CT scan and definitely not the lab appt.  He was adamant that pt does not want to have any more labs that her PCP had drawn enough.  Advised that they were not the labs [place] needed.  He said we could schedule lab work but he didn't know if they would show up or not.

## 2021-06-23 DIAGNOSIS — M25511 Pain in right shoulder: Secondary | ICD-10-CM | POA: Diagnosis not present

## 2021-06-23 DIAGNOSIS — S42201D Unspecified fracture of upper end of right humerus, subsequent encounter for fracture with routine healing: Secondary | ICD-10-CM | POA: Diagnosis not present

## 2021-06-25 ENCOUNTER — Other Ambulatory Visit: Payer: Self-pay | Admitting: Neurology

## 2021-06-25 ENCOUNTER — Other Ambulatory Visit: Payer: Self-pay

## 2021-06-25 DIAGNOSIS — G20 Parkinson's disease: Secondary | ICD-10-CM

## 2021-06-25 MED ORDER — CARBIDOPA-LEVODOPA 25-100 MG PO TABS: ORAL_TABLET | ORAL | 1 refills | Status: DC

## 2021-06-26 ENCOUNTER — Other Ambulatory Visit: Payer: Medicare HMO

## 2021-06-29 ENCOUNTER — Other Ambulatory Visit: Payer: Medicare HMO

## 2021-06-29 ENCOUNTER — Ambulatory Visit (HOSPITAL_COMMUNITY): Payer: Medicare HMO

## 2021-06-29 ENCOUNTER — Telehealth: Payer: Self-pay

## 2021-06-29 NOTE — Telephone Encounter (Signed)
She cancelled her CT scan for today.Notes say she was sick. .Can you follow up and reschedule please  T/C to pt to reschedule CT scan.  LM for pt with the number for scheduling to RS CT scan and the number to [facility] to reschedule the lab appt.

## 2021-06-30 ENCOUNTER — Encounter: Payer: Self-pay | Admitting: Neurology

## 2021-06-30 DIAGNOSIS — M25511 Pain in right shoulder: Secondary | ICD-10-CM | POA: Diagnosis not present

## 2021-06-30 DIAGNOSIS — S42201D Unspecified fracture of upper end of right humerus, subsequent encounter for fracture with routine healing: Secondary | ICD-10-CM | POA: Diagnosis not present

## 2021-07-06 ENCOUNTER — Telehealth: Payer: Self-pay

## 2021-07-06 NOTE — Telephone Encounter (Signed)
--  caller states his wife is supposed to be getting PT for her arm but was not able to get it today due to elevate BP. her current BP is 160/80 but states that earlier it was 180/95. no fever.  07/03/2021 4:34:07 PM Go to ED Now  Hassell Done, RN, Joelene Millin  Referrals GO TO FACILITY REFUSED  07/06/21 1255: Pt states that most recent BP 136/80 today at 1230.  Other readings during the weekend: 171/68 162/85 148/69 Denies h/a or chest pain. No history of HTN. Appt scheduled with PCP for 07/08/21.

## 2021-07-07 DIAGNOSIS — M25511 Pain in right shoulder: Secondary | ICD-10-CM | POA: Diagnosis not present

## 2021-07-07 DIAGNOSIS — S42201D Unspecified fracture of upper end of right humerus, subsequent encounter for fracture with routine healing: Secondary | ICD-10-CM | POA: Diagnosis not present

## 2021-07-08 ENCOUNTER — Ambulatory Visit: Payer: Medicare HMO | Admitting: Family Medicine

## 2021-07-10 DIAGNOSIS — M25511 Pain in right shoulder: Secondary | ICD-10-CM | POA: Diagnosis not present

## 2021-07-10 DIAGNOSIS — S42201D Unspecified fracture of upper end of right humerus, subsequent encounter for fracture with routine healing: Secondary | ICD-10-CM | POA: Diagnosis not present

## 2021-07-13 DIAGNOSIS — D2272 Melanocytic nevi of left lower limb, including hip: Secondary | ICD-10-CM | POA: Diagnosis not present

## 2021-07-13 DIAGNOSIS — D692 Other nonthrombocytopenic purpura: Secondary | ICD-10-CM | POA: Diagnosis not present

## 2021-07-13 DIAGNOSIS — L814 Other melanin hyperpigmentation: Secondary | ICD-10-CM | POA: Diagnosis not present

## 2021-07-13 DIAGNOSIS — D485 Neoplasm of uncertain behavior of skin: Secondary | ICD-10-CM | POA: Diagnosis not present

## 2021-07-13 DIAGNOSIS — L570 Actinic keratosis: Secondary | ICD-10-CM | POA: Diagnosis not present

## 2021-07-13 DIAGNOSIS — L821 Other seborrheic keratosis: Secondary | ICD-10-CM | POA: Diagnosis not present

## 2021-07-14 DIAGNOSIS — M25511 Pain in right shoulder: Secondary | ICD-10-CM | POA: Diagnosis not present

## 2021-07-14 DIAGNOSIS — S42201D Unspecified fracture of upper end of right humerus, subsequent encounter for fracture with routine healing: Secondary | ICD-10-CM | POA: Diagnosis not present

## 2021-07-15 ENCOUNTER — Ambulatory Visit (INDEPENDENT_AMBULATORY_CARE_PROVIDER_SITE_OTHER): Payer: Medicare HMO | Admitting: Family Medicine

## 2021-07-15 ENCOUNTER — Encounter: Payer: Self-pay | Admitting: Family Medicine

## 2021-07-15 VITALS — BP 78/50 | HR 81 | Temp 97.8°F | Ht 62.0 in | Wt 106.9 lb

## 2021-07-15 DIAGNOSIS — R42 Dizziness and giddiness: Secondary | ICD-10-CM | POA: Diagnosis not present

## 2021-07-15 DIAGNOSIS — I959 Hypotension, unspecified: Secondary | ICD-10-CM

## 2021-07-15 DIAGNOSIS — D539 Nutritional anemia, unspecified: Secondary | ICD-10-CM | POA: Diagnosis not present

## 2021-07-15 DIAGNOSIS — R3915 Urgency of urination: Secondary | ICD-10-CM | POA: Diagnosis not present

## 2021-07-15 NOTE — Patient Instructions (Signed)
Get back on the Midodrine three times daily  Stay well hydrated.    Continue to monitor blood pressure and be in touch if BP not improving on the Midodrine.

## 2021-07-15 NOTE — Progress Notes (Signed)
Established Patient Office Visit  Subjective:  Patient ID: Lindsey Bryant, female    DOB: November 22, 1940  Age: 81 y.o. MRN: [id number]  CC:  Chief Complaint  Patient presents with   Follow-up    Dizziness/ low BP/ weakness    HPI Lindsey Bryant presents for dizziness and weakness.  She has chronic problems including history of CAD, neurogenic orthostatic hypotension.  IBS, Parkinson's disease, macrocytic anemia.  Recent pathologic fracture right humerus.  Intramedullary lesion by imaging and patient refuses further evaluation by oncology.  She is currently undergoing physical therapy for her right shoulder and that seems to be improving.  Her pain is less.  She had macrocytic anemia.  No evidence from recent evaluation for plasma cell cytoma.  She did agree to B12 and folate levels and these were normal.  She is on Sinemet and had been placed previously per neurology on midodrine 3 times daily.  However, she stopped taking this.  She had some scalp itching and frequent urination and thought this was related to the midodrine.  She continued to have the symptoms even after stopping the midodrine.  She also had some blood pressure readings infrequently up around 703-500 systolic and was concerned that the midodrine was running that up although in reviewing her home blood pressure medications most of them were in the low 100 range.  She denies any recent falls.  She has a walker at home but not using consistently.  She knows to stand for several seconds when she first stands up before walking.  She continues to have urine urgency.  No burning with urination.  Previously took Myrbetriq but did not see much improvement if any.  We have stressed importance of avoiding any anticholinergic medications such as Ditropan with her issues above  Past Medical History:  Diagnosis Date   Acute systolic heart failure (HCC)    Bradycardia    a. 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   Parkinson's disease     Past Surgical History:  Procedure Laterality Date   APPENDECTOMY  1952   COLONOSCOPY     EYE SURGERY  07/2017   Cataract Sx   LEFT HEART CATH AND CORONARY ANGIOGRAPHY N/A 08/03/2016   Procedure: Left Heart Cath and Coronary Angiography;  Surgeon: Leonie Man, MD;  Location: [place] [facility];  Service: Cardiovascular;  Laterality: N/A;   MELANOMA EXCISION Right 2000   right arm   PACEMAKER PLACEMENT  05/2012   PACEMAKER REVISION N/A 03/14/2012   Procedure: PACEMAKER REVISION;  Surgeon: Thompson Grayer, MD;  Location: [facility] CATH LAB;  Service: Cardiovascular;  Laterality: N/A;   PERMANENT PACEMAKER INSERTION N/A 03/06/2012   Procedure: PERMANENT PACEMAKER INSERTION;  Surgeon: Deboraha Sprang, MD;  Location: [facility] CATH LAB;  Service: Cardiovascular;  Laterality: N/A;   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    Family History  Problem Relation Age of Onset   Heart attack Mother 63       Details unclear   Alcohol abuse Father    Breast cancer Sister 38   Diabetes Brother    Hypothyroidism Daughter    Hypothyroidism Daughter    Breast cancer Other    Colon cancer Neg Hx    Esophageal cancer Neg Hx    Stomach cancer Neg  Hx    Pancreatic cancer Neg Hx    Liver disease Neg Hx     Social History   Socioeconomic History   Marital status: Married    Spouse name: Not on file   Number of children: 2   Years of education: Not on file   Highest education level: Not on file  Occupational History   Not on file  Tobacco Use   Smoking status: Former   Smokeless tobacco: Never   Tobacco comments:    Smoked cigarettes for 15 yrs   Vaping Use   Vaping Use: Never used  Substance and Sexual Activity   Alcohol use: No    Comment: recovering alcoholic   Drug use: No   Sexual activity: Not on file  Other Topics Concern   Not on file   Social History Narrative   Retired Pharmacist, hospital, married. 2 daughters I think.   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      Right Handed   Social Determinants of Health   Financial Resource Strain: Not on file  Food Insecurity: No Food Insecurity   Worried About Charity fundraiser in the Last Year: Never true   Ran Out of Food in the Last Year: Never true  Transportation Needs: Not on file  Physical Activity: Sufficiently Active   Days of Exercise per Week: 5 days   Minutes of Exercise per Session: 30 min  Stress: No Stress Concern Present   Feeling of Stress : Not at all  Social Connections: Moderately Isolated   Frequency of Communication with Friends and Family: Twice a week   Frequency of Social Gatherings with Friends and Family: Twice a week   Attends Religious Services: Never   Marine scientist or Organizations: No   Attends Music therapist: Never   Marital Status: Married  Human resources officer Violence: Not At Risk   Fear of Current or Ex-Partner: No   Emotionally Abused: No   Physically Abused: No   Sexually Abused: No    Outpatient Medications Prior to Visit  Medication Sig Dispense Refill   Calcium-Phosphorus-Vitamin D (CITRACAL CALCIUM GUMMIES PO) Take by mouth as directed.     carbidopa-levodopa (SINEMET IR) 25-100 MG tablet Take 2 tablets at 6/9/noon/3pm/6pm/9pm 1080 tablet 1   dicyclomine (BENTYL) 10 MG capsule TAKE 1 CAPSULE BY MOUTH EVERY 6 HOURS AS NEEDED 60 capsule 2   melatonin 5 MG TABS Take 5 mg by mouth at bedtime.     midodrine (PROAMATINE) 5 MG tablet 2 TABLETS AT 7AM, 1 TABLET AT 11AM, 1 TABLET AT 3PM 360 tablet 0   mirtazapine (REMERON) 7.5 MG tablet TAKE 1 TABLET BY MOUTH AT BEDTIME. 90 tablet 2   polyethylene glycol powder (GLYCOLAX/MIRALAX) 17 GM/SCOOP powder Take 17 g by mouth daily.     Probiotic Product (PROBIOTIC PO) Take 1 capsule by mouth daily.     Propylene Glycol (SYSTANE COMPLETE OP) Apply 1 drop to eye daily.      Simethicone (GAS-X PO) Take 1 tablet by mouth. After meals and at bedtime     VITAMIN E PO Take 1 capsule by mouth daily.     HYDROcodone-acetaminophen (NORCO/VICODIN) 5-325 MG tablet Take 1 tablet by mouth every 4 (four) hours as needed.     No facility-administered medications prior to visit.    No Known Allergies  ROS Review of Systems  Constitutional:  Negative for chills and fever.  Respiratory:  Negative for shortness of breath.  Cardiovascular:  Negative for chest pain.  Neurological:  Positive for dizziness and light-headedness. Negative for seizures, syncope and speech difficulty.     Objective:      BP (!) 78/50 (BP Location: Left Arm, Cuff Size: Normal)    Pulse 81    Temp 97.8 F (36.6 C) (Oral)    Ht 5\' 2"  (1.575 m)    Wt 106 lb 14.4 oz (48.5 kg)    SpO2 97%    BMI 19.55 kg/m  Wt Readings from Last 3 Encounters:  07/15/21 106 lb 14.4 oz (48.5 kg)  06/12/21 110 lb 8 oz (50.1 kg)  06/02/21 110 lb 6.4 oz (50.1 kg)     Health Maintenance Due  Topic Date Due   COVID-19 Vaccine (4 - Booster) 04/28/2021   TETANUS/TDAP  06/03/2021    There are no preventive care reminders to display for this patient.  Lab Results  Component Value Date   TSH 1.22 04/28/2021   Lab Results  Component Value Date   WBC 8.4 05/29/2021   HGB 10.8 (L) 05/29/2021   HCT 31.9 (L) 05/29/2021   MCV 102.6 (H) 05/29/2021   PLT 282 05/29/2021   Lab Results  Component Value Date   NA 134 (L) 05/29/2021   K 4.1 05/29/2021   CO2 29 05/29/2021   GLUCOSE 94 05/29/2021   BUN 14 05/29/2021   CREATININE 0.73 05/29/2021   BILITOT 0.7 05/29/2021   ALKPHOS 89 05/29/2021   AST 11 (L) 05/29/2021   ALT <5 05/29/2021   PROT 6.8 05/29/2021   ALBUMIN 4.1 05/29/2021   CALCIUM 9.9 05/29/2021   ANIONGAP 6 05/29/2021   GFR 80.69 04/28/2021   Lab Results  Component Value Date   CHOL 198 04/28/2021   Lab Results  Component Value Date   HDL 83.80 04/28/2021   Lab Results  Component  Value Date   LDLCALC 105 (H) 04/28/2021   Lab Results  Component Value Date   TRIG 49.0 04/28/2021   Lab Results  Component Value Date   CHOLHDL 2 04/28/2021   Lab Results  Component Value Date   HGBA1C 5.3 07/29/2016      Assessment & Plan:   #1 orthostatic dizziness.  Blood pressure today repeated by me left arm seated 78/50.  When she stood up she was amazingly not symptomatic but I had difficulty palpating pulse and could not auscultate a pulse.  She has neurogenic orthostatic hypotension  -Stressed importance of adequate hydration -Strongly advise she get back on midodrine 3 times daily and continue to monitor blood pressure closely -Walker use at all times at home especially now until her blood pressure stabilizes  #2 macrocytic anemia.  B12 and folate normal.  Patient declining further evaluation per oncology  #3 recent pathologic fracture right humerus.  Patient declines biopsy and further evaluation.  She is aware this may represent lymphoma or other malignancy.  #4 urinary urgency.  Previous inadequate response with Myrbetriq.  We strongly advise against anticholinergic medications at this time   No orders of the defined types were placed in this encounter.   Follow-up: No follow-ups on file.    [name], MD

## 2021-07-17 DIAGNOSIS — S42201D Unspecified fracture of upper end of right humerus, subsequent encounter for fracture with routine healing: Secondary | ICD-10-CM | POA: Diagnosis not present

## 2021-07-17 DIAGNOSIS — M25511 Pain in right shoulder: Secondary | ICD-10-CM | POA: Diagnosis not present

## 2021-07-20 DIAGNOSIS — M25511 Pain in right shoulder: Secondary | ICD-10-CM | POA: Diagnosis not present

## 2021-07-21 DIAGNOSIS — S42201D Unspecified fracture of upper end of right humerus, subsequent encounter for fracture with routine healing: Secondary | ICD-10-CM | POA: Diagnosis not present

## 2021-07-21 DIAGNOSIS — M25511 Pain in right shoulder: Secondary | ICD-10-CM | POA: Diagnosis not present

## 2021-07-24 DIAGNOSIS — M25511 Pain in right shoulder: Secondary | ICD-10-CM | POA: Diagnosis not present

## 2021-07-24 DIAGNOSIS — S42201D Unspecified fracture of upper end of right humerus, subsequent encounter for fracture with routine healing: Secondary | ICD-10-CM | POA: Diagnosis not present

## 2021-07-29 ENCOUNTER — Ambulatory Visit (INDEPENDENT_AMBULATORY_CARE_PROVIDER_SITE_OTHER): Payer: Medicare HMO

## 2021-07-29 DIAGNOSIS — I428 Other cardiomyopathies: Secondary | ICD-10-CM

## 2021-07-30 LAB — CUP PACEART REMOTE DEVICE CHECK
Battery Impedance: 1616 Ohm
Battery Remaining Longevity: 42 mo
Battery Voltage: 2.76 V
Brady Statistic AP VP Percent: 18 %
Brady Statistic AP VS Percent: 0 %
Brady Statistic AS VP Percent: 82 %
Brady Statistic AS VS Percent: 0 %
Date Time Interrogation Session: 20230308182536
Implantable Lead Implant Date: 20131014
Implantable Lead Implant Date: 20131014
Implantable Lead Location: 753859
Implantable Lead Location: 753860
Implantable Lead Model: 5076
Implantable Lead Model: 5076
Implantable Pulse Generator Implant Date: 20131014
Lead Channel Impedance Value: 426 Ohm
Lead Channel Impedance Value: 689 Ohm
Lead Channel Pacing Threshold Amplitude: 0.5 V
Lead Channel Pacing Threshold Amplitude: 0.625 V
Lead Channel Pacing Threshold Pulse Width: 0.4 ms
Lead Channel Pacing Threshold Pulse Width: 0.4 ms
Lead Channel Setting Pacing Amplitude: 2 V
Lead Channel Setting Pacing Amplitude: 2.5 V
Lead Channel Setting Pacing Pulse Width: 0.4 ms
Lead Channel Setting Sensing Sensitivity: 4 mV

## 2021-08-03 ENCOUNTER — Other Ambulatory Visit: Payer: Self-pay | Admitting: Internal Medicine

## 2021-08-11 NOTE — Progress Notes (Signed)
Remote pacemaker transmission.   

## 2021-08-14 ENCOUNTER — Telehealth: Payer: Self-pay | Admitting: Pharmacist

## 2021-08-14 NOTE — Chronic Care Management (AMB) (Signed)
? ? ?  Chronic Care Management ?Pharmacy Assistant  ? ?Name: Lindsey Bryant  MRN: [id number] DOB: Mar 29, 1941 ? ?08/14/21 APPOINTMENT REMINDER ? ? ?Called Patient No answer, left message of appointment on 08/17/21 at 1:30 via telephone visit with Jeni Salles, Pharm D.  ? ?Notified to have all medications, supplements, blood pressure and/or blood sugar logs available during appointment and to return call if need to reschedule. ? ?  ? ? ?Care Gaps: ?COVID Booster - Overdue ?TDAP - Overdue ?AWV- 5/22 ? ?Star Rating Drug: ?None ? ? ? ?Medications: ?Outpatient Encounter Medications as of 08/14/2021  ?Medication Sig  ? Calcium-Phosphorus-Vitamin D (CITRACAL CALCIUM GUMMIES PO) Take by mouth as directed.  ? carbidopa-levodopa (SINEMET IR) 25-100 MG tablet Take 2 tablets at 6/9/noon/3pm/6pm/9pm  ? dicyclomine (BENTYL) 10 MG capsule TAKE 1 CAPSULE BY MOUTH EVERY 6 HOURS AS NEEDED  ? melatonin 5 MG TABS Take 5 mg by mouth at bedtime.  ? midodrine (PROAMATINE) 5 MG tablet 2 TABLETS AT 7AM, 1 TABLET AT 11AM, 1 TABLET AT 3PM  ? mirtazapine (REMERON) 7.5 MG tablet TAKE 1 TABLET BY MOUTH AT BEDTIME.  ? polyethylene glycol powder (GLYCOLAX/MIRALAX) 17 GM/SCOOP powder Take 17 g by mouth daily.  ? Probiotic Product (PROBIOTIC PO) Take 1 capsule by mouth daily.  ? Propylene Glycol (SYSTANE COMPLETE OP) Apply 1 drop to eye daily.  ? Simethicone (GAS-X PO) Take 1 tablet by mouth. After meals and at bedtime  ? VITAMIN E PO Take 1 capsule by mouth daily.  ? ?No facility-administered encounter medications on file as of 08/14/2021.  ? ? ? ?Ned Clines CMA ?Clinical Pharmacist Assistant ?[phone] ? ?

## 2021-08-15 ENCOUNTER — Other Ambulatory Visit: Payer: Self-pay | Admitting: Internal Medicine

## 2021-08-17 ENCOUNTER — Ambulatory Visit (INDEPENDENT_AMBULATORY_CARE_PROVIDER_SITE_OTHER): Payer: Medicare HMO | Admitting: Pharmacist

## 2021-08-17 DIAGNOSIS — I959 Hypotension, unspecified: Secondary | ICD-10-CM

## 2021-08-17 DIAGNOSIS — I214 Non-ST elevation (NSTEMI) myocardial infarction: Secondary | ICD-10-CM

## 2021-08-17 NOTE — Patient Instructions (Signed)
Hi Lindsey Bryant, ? ?It was great to get to meet you over the telephone! Below is a summary of some of the topics we discussed.  ? ?I'll let you know what I can find out about the midodrine! ? ?Please reach out to me if you have any questions or need anything! ? ?Best, ?Maddie ? ?Jeni Salles, PharmD, BCACP ?Clinical Pharmacist ?Therapist, music at [place] ?[phone] ? ? Visit Information ? ? Goals Addressed   ?None ?  ? ?Patient Care Plan: [place]  ?  ? ?Problem Identified: Problem: Hyperlipidemia and History of NSTEMI, Parkinson's disease, Insomnia   ?  ? ?Long-Range Goal: Patient-Specific Goal   ?Start Date: 08/17/2021  ?Expected End Date: 08/18/2022  ?This Visit's Progress: On track  ?Priority: High  ?Note:   ?Current Barriers:  ?Unable to independently monitor therapeutic efficacy ?Unable to achieve control of blood pressure  ? ?Pharmacist Clinical Goal(s):  ?Patient will achieve adherence to monitoring guidelines and medication adherence to achieve therapeutic efficacy ?achieve control of blood pressure as evidenced by home and office readings  through collaboration with PharmD and provider.  ? ?Interventions: ?1:1 collaboration with [name], MD regarding development and update of comprehensive plan of care as evidenced by provider attestation and co-signature ?Inter-disciplinary care team collaboration (see longitudinal plan of care) ?Comprehensive medication review performed; medication list updated in electronic medical record ? ?Hypotension (BP goal <140/90 and > 100/60) ?-Uncontrolled ?-Current treatment: ?Midodrine 5 mg 2 tablets at 7 am, 1 tablet at 11am and 1 tablet at 3 pm - taking twice daily - Appropriate, Query effective, Safe, Accessible ?-Medications previously tried: none  ?-Current home readings: checking after breakfast daily: 103/59 HR 66, 145/81 HR 76, 87/58 HR 77, 97/62 HR, 92/58, 142/83, 183/102  ?-Current dietary habits: was eating salt heavy foods but  has backed down some ?-Current exercise habits: minimal ?-Reports hypotensive/hypertensive symptoms ?-Educated on Importance of home blood pressure monitoring; ?Proper BP monitoring technique; ?Symptoms of hypotension and importance of maintaining adequate hydration; ?-Counseled to monitor BP at home daily, document, and provide log at future appointments ?-Collaborated with Dr. Carles Collet for parameters for blood pressure and taking midodrine. ? ?Hyperlipidemia/questionable NSTEMI/CAD: (LDL goal < 70) ?-Uncontrolled ?-Current treatment: ?No medications ?-Medications previously tried: atorvastatin (stomach upset)  ?-Current dietary patterns: did not discuss ?-Current exercise habits: minimal ?-Educated on Benefits of statin for ASCVD risk reduction; ?-Collaborated with PCP to consider restarting statin and aspirin therapy depending on indication. ? ?Insomnia (Goal: improve quality and quantity of sleep) ?-Controlled ?-Current treatment  ?Melatonin 5 mg 1 tablet at bedtime - Appropriate, Effective, Safe, Accessible ?-Medications previously tried: mirtazapine (no longer needed)  ?-Recommended avoiding higher doses of melatonin.  ? ?Parkinson's disease (Goal: minimize symptoms) ?-Controlled ?-Current treatment  ?Carbidopa-levodopa IR 25-100 mg 2 tablets at 6/9/noon/3/6/9pm - Appropriate, Effective, Safe, Accessible ?-Medications previously tried: none  ?-Recommended to continue current medication ? ?IBS (Goal: minimize symptoms) ?-Controlled ?-Current treatment  ?Miralax daily - Appropriate, Effective, Safe, Accessible ?Probiotic daily - not taking regularly ?Simethicone as needed - Appropriate, Effective, Safe, Accessible ?Dicyclomine 10 mg 1 tablet every 6 hours as needed - Appropriate, Effective, Safe, Accessible ?-Medications previously tried: none  ?-Recommended to continue current medication ? ? ?Health Maintenance ?-Vaccine gaps: tetanus, COVID booster ?-Current therapy:  ?Miralax as needed ?Probiotic  daily ?Simethicone as needed ?Vitamin E daily ?Ibuprofen 200 mg as needed ?-Educated on Cost vs benefit of each product must be carefully weighed by individual consumer ?-Patient is satisfied with  current therapy and denies issues ?-Recommended to continue current medication ? ?Patient Goals/Self-Care Activities ?Patient will:  ?- check blood pressure daily, document, and provide at future appointments ? ?Follow Up Plan: The care management team will reach out to the patient again over the next 7 days.  ? ?  ?  ? ?Patient verbalizes understanding of instructions and care plan provided today and agrees to view in [place]. Active MyChart status confirmed with patient.   ?The pharmacy team will reach out to the patient again over the next 7 days.  ? ?Viona Gilmore, RPH  ?

## 2021-08-17 NOTE — Progress Notes (Signed)
? ?Chronic Care Management ?Pharmacy Note ? ?08/17/2021 ?Name:  Lindsey Bryant MRN:  [id number] DOB:  31-Jan-1941 ? ?Summary: ?BP is fluctuating significantly ? ?Recommendations/Changes made from today's visit: ?-Recommended checking BP prior to taking midodrine every morning ?-Reached out to neurologist about BP parameters for taking midodrine ?-Consider restarting aspirin and statin based on cardiac indication ? ?Plan: ?Follow up with response from neurology ?BP assessment in 1 month ? ? ?Subjective: ?Lindsey Bryant is an 81 y.o. year old female who is a primary patient of Burchette, Alinda Sierras, MD.  The CCM team was consulted for assistance with disease management and care coordination needs.   ? ?Engaged with patient by telephone for follow up visit in response to provider referral for pharmacy case management and/or care coordination services.  ? ?Consent to Services:  ?The patient was given information about Chronic Care Management services, agreed to services, and gave verbal consent prior to initiation of services.  Please see initial visit note for detailed documentation.  ? ?Patient Care Team: ?[name], MD as PCP - General (Family Medicine) ?Deboraha Sprang, MD as PCP - Cardiology (Cardiology) ?Deboraha Sprang, MD as PCP - Electrophysiology (Cardiology) ?Sherlyn Lees, MD as Referring Physician (Neurology) ?Ludwig Clarks, DO as Consulting Physician (Neurology) ?Viona Gilmore, [facility] as Pharmacist (Pharmacist) ? ?Recent office visits: ?07/15/21 [name], MD: Patient presented for hypotension. Recommended getting back on Midodrine TID. ? ?06/12/21 [name], MD: Patient presented for macrocytic anemia. ? ?04/28/2021 [name] MD - Patient was seen for a physical exam. Discontinued Mirtazapine. No follow up noted. ? ?Recent consult visits: ?06/09/21 Susa Day (ortho): Patient presented for pain in right shoulder. Unable to access notes. ? ?06/02/21 Alonza Bogus, DO (neurology):  Patient presented for Parkinson's disease follow up. Prescribed mirtazapine 7.5 mg at bedtime. ? ?05/29/21 Dede Query, PA-C (hem/onc): Patient presented for initial consultation for fracture of right humerus with soft tissue mass. ? ?02/04/21 Alonza Bogus PA-C (Gastroenterology) - seen for bilateral lower abdominal pain. No medication changes. Call in 2 weeks for update.  ? ?Hospital visits: ?Patient was seen at [facility] ED on 05/09/2021 due to Humeral head fracture, right. Discharged after 3 hours.  ?  ?New?Medications Started at [facility] Discharge:?? ?-Percocet 5/325 mg - take 1 tablet every 6 hours as needed ?  ?Medication Changes at Hospital Discharge: ?No medication changes. ?  ?Medications Discontinued at Hospital Discharge: ?No medications stopped.  ?  ?Medications that remain the same after Hospital Discharge:??  ?-All other medications will remain the same.  ? ? ?Objective: ? ?Lab Results  ?Component Value Date  ? CREATININE 0.73 05/29/2021  ? BUN 14 05/29/2021  ? GFR 80.69 04/28/2021  ? GFRNONAA >60 05/29/2021  ? GFRAA 90 01/03/2017  ? NA 134 (L) 05/29/2021  ? K 4.1 05/29/2021  ? CALCIUM 9.9 05/29/2021  ? CO2 29 05/29/2021  ? GLUCOSE 94 05/29/2021  ? ? ?Lab Results  ?Component Value Date/Time  ? HGBA1C 5.3 07/29/2016 12:42 PM  ? GFR 80.69 04/28/2021 01:53 PM  ? GFR 78.57 02/27/2020 11:53 AM  ?  ?Last diabetic Eye exam: No results found for: HMDIABEYEEXA  ?Last diabetic Foot exam: No results found for: HMDIABFOOTEX  ? ?Lab Results  ?Component Value Date  ? CHOL 198 04/28/2021  ? HDL 83.80 04/28/2021  ? LDLCALC 105 (H) 04/28/2021  ? LDLDIRECT 111.7 06/05/2012  ? TRIG 49.0 04/28/2021  ? CHOLHDL 2 04/28/2021  ? ? ? ?  Latest Ref Rng &  Units 05/29/2021  ?  3:14 PM 04/28/2021  ?  1:53 PM 02/27/2020  ? 11:53 AM  ?Hepatic Function  ?Total Protein 6.5 - 8.1 g/dL 6.8   6.6   7.0    ?Albumin 3.5 - 5.0 g/dL 4.1   4.2   4.4    ?AST 15 - 41 U/L 11   16   15     ?ALT 0 - 44 U/L <5   3   2     ?Alk Phosphatase 38 -  126 U/L 89   62   48    ?Total Bilirubin 0.3 - 1.2 mg/dL 0.7   0.5   0.5    ?Bilirubin, Direct 0.0 - 0.3 mg/dL  0.1     ? ? ?Lab Results  ?Component Value Date/Time  ? TSH 1.22 04/28/2021 01:53 PM  ? TSH 1.45 12/26/2019 02:54 PM  ? FREET4 0.84 07/29/2016 12:08 PM  ? ? ? ?  Latest Ref Rng & Units 05/29/2021  ?  3:14 PM 04/28/2021  ?  1:53 PM 02/27/2020  ? 11:53 AM  ?CBC  ?WBC 4.0 - 10.5 K/uL 8.4   5.9   6.1    ?Hemoglobin 12.0 - 15.0 g/dL 10.8   12.7   12.9    ?Hematocrit 36.0 - 46.0 % 31.9   38.3   38.2    ?Platelets 150 - 400 K/uL 282   172.0   138.0    ? ? ?Lab Results  ?Component Value Date/Time  ? VD25OH 80 03/24/2009 08:48 PM  ? ? ?Clinical ASCVD: Yes  ?The ASCVD Risk score (Arnett DK, et al., 2019) failed to calculate for the following reasons: ?  The 2019 ASCVD risk score is only valid for ages 33 to 31 ?  The patient has a prior MI or stroke diagnosis   ? ? ?  07/15/2021  ? 11:21 AM 10/14/2020  ?  3:22 PM 10/14/2020  ?  3:21 PM  ?Depression screen PHQ 2/9  ?Decreased Interest 0 0 0  ?Down, Depressed, Hopeless 0 0 0  ?PHQ - 2 Score 0 0 0  ?  ? ? ?Social History  ? ?Tobacco Use  ?Smoking Status Former  ?Smokeless Tobacco Never  ?Tobacco Comments  ? Smoked cigarettes for 15 yrs   ? ?BP Readings from Last 3 Encounters:  ?07/15/21 (!) 78/50  ?06/12/21 124/62  ?06/02/21 136/82  ? ?Pulse Readings from Last 3 Encounters:  ?07/15/21 81  ?06/12/21 73  ?06/02/21 83  ? ?Wt Readings from Last 3 Encounters:  ?07/15/21 106 lb 14.4 oz (48.5 kg)  ?06/12/21 110 lb 8 oz (50.1 kg)  ?06/02/21 110 lb 6.4 oz (50.1 kg)  ? ?BMI Readings from Last 3 Encounters:  ?07/15/21 19.55 kg/m?  ?06/12/21 19.57 kg/m?  ?06/02/21 19.56 kg/m?  ? ? ?Assessment/Interventions: Review of patient past medical history, allergies, medications, health status, including review of consultants reports, laboratory and other test data, was performed as part of comprehensive evaluation and provision of chronic care management services.  ? ?SDOH:  (Social Determinants  of Health) assessments and interventions performed: No ? ?SDOH Screenings  ? ?Alcohol Screen: Low Risk   ? Last Alcohol Screening Score (AUDIT): 0  ?Depression (PHQ2-9): Low Risk   ? PHQ-2 Score: 0  ?Financial Resource Strain: Not on file  ?Food Insecurity: No Food Insecurity  ? Worried About Charity fundraiser in the Last Year: Never true  ? Ran Out of Food in the Last Year: Never true  ?Housing:  Low Risk   ? Last Housing Risk Score: 0  ?Physical Activity: Sufficiently Active  ? Days of Exercise per Week: 5 days  ? Minutes of Exercise per Session: 30 min  ?Social Connections: Moderately Isolated  ? Frequency of Communication with Friends and Family: Twice a week  ? Frequency of Social Gatherings with Friends and Family: Twice a week  ? Attends Religious Services: Never  ? Active Member of Clubs or Organizations: No  ? Attends Archivist Meetings: Never  ? Marital Status: Married  ?Stress: No Stress Concern Present  ? Feeling of Stress : Not at all  ?Tobacco Use: Medium Risk  ? Smoking Tobacco Use: Former  ? Smokeless Tobacco Use: Never  ? Passive Exposure: Not on file  ?Transportation Needs: Not on file  ? ? ?[place] ? ?No Known Allergies ? ?Medications Reviewed Today   ? ? Reviewed by Viona Gilmore, [place] (Pharmacist) on 08/17/21 at 1354  Med List Status: <None>  ? ?Medication Order Taking? Sig Documenting Provider Last Dose Status Informant  ?carbidopa-levodopa (SINEMET IR) 25-100 MG tablet [id number] Yes Take 2 tablets at 6/9/noon/3pm/6pm/9pm Tat, Eustace Quail, DO Taking Active   ?dicyclomine (BENTYL) 10 MG capsule [id number] Yes TAKE 1 CAPSULE BY MOUTH EVERY 6 HOURS AS NEEDED Gatha Mayer, MD Taking Active   ?melatonin 5 MG TABS [id number] Yes Take 5 mg by mouth at bedtime. [provider] Taking Active Self  ?midodrine (PROAMATINE) 5 MG tablet [id number] Yes 2 TABLETS AT 7AM, 1 TABLET AT 11AM, 1 TABLET AT 3PM Tat, Rebecca S, DO Taking Active   ?polyethylene glycol powder  (GLYCOLAX/MIRALAX) 17 GM/SCOOP powder [id number] Yes Take 17 g by mouth daily. [provider] Taking Active   ?Probiotic Product (PROBIOTIC PO) [id number] Yes Take 1 capsule by mouth daily as needed. Provider, Hi

## 2021-08-21 DIAGNOSIS — I2510 Atherosclerotic heart disease of native coronary artery without angina pectoris: Secondary | ICD-10-CM | POA: Diagnosis not present

## 2021-08-21 DIAGNOSIS — E039 Hypothyroidism, unspecified: Secondary | ICD-10-CM

## 2021-08-21 DIAGNOSIS — G20 Parkinson's disease: Secondary | ICD-10-CM

## 2021-08-21 DIAGNOSIS — E785 Hyperlipidemia, unspecified: Secondary | ICD-10-CM | POA: Diagnosis not present

## 2021-08-21 DIAGNOSIS — I959 Hypotension, unspecified: Secondary | ICD-10-CM

## 2021-09-04 ENCOUNTER — Telehealth: Payer: Self-pay | Admitting: Pharmacist

## 2021-09-04 NOTE — Telephone Encounter (Signed)
Called patient to follow up after CCM visit. Patient is aware to restart aspirin 81 mg daily per discussion with PCP and history of NSTEMI. Patient does not wish to add a cholesterol medication at this time. ? ?Spoke with her about midodrine dosing and this still varies. Per Dr. Carles Collet, she is ok with permissive HTN as high as 180 but if her readings are consistenyl > 180, patient needs to call the office to re-evaluate the frequency/dosing of midodrine. Plan to follow up for BP assessment in 3-4 weeks. ?

## 2021-09-07 DIAGNOSIS — M25511 Pain in right shoulder: Secondary | ICD-10-CM | POA: Diagnosis not present

## 2021-09-14 DIAGNOSIS — L821 Other seborrheic keratosis: Secondary | ICD-10-CM | POA: Diagnosis not present

## 2021-09-14 DIAGNOSIS — L570 Actinic keratosis: Secondary | ICD-10-CM | POA: Diagnosis not present

## 2021-09-30 ENCOUNTER — Telehealth: Payer: Self-pay | Admitting: Pharmacist

## 2021-09-30 NOTE — Chronic Care Management (AMB) (Signed)
    Chronic Care Management Pharmacy Assistant   Name: Lindsey Bryant  MRN: [id number] DOB: 11-Apr-1941  Reason for Encounter: Follow up hypotension  Reviewed chart prior to disease state call. Spoke with patient regarding BP  Recent Office Vitals: BP Readings from Last 3 Encounters:  07/15/21 (!) 78/50  06/12/21 124/62  06/02/21 136/82   Pulse Readings from Last 3 Encounters:  07/15/21 81  06/12/21 73  06/02/21 83    Wt Readings from Last 3 Encounters:  07/15/21 106 lb 14.4 oz (48.5 kg)  06/12/21 110 lb 8 oz (50.1 kg)  06/02/21 110 lb 6.4 oz (50.1 kg)     Kidney Function Lab Results  Component Value Date/Time   CREATININE 0.73 05/29/2021 03:14 PM   CREATININE 0.71 04/28/2021 01:53 PM   CREATININE 0.73 02/27/2020 11:53 AM   CREATININE 0.78 12/26/2019 02:54 PM   GFR 80.69 04/28/2021 01:53 PM   GFRNONAA >60 05/29/2021 03:14 PM   GFRAA 90 01/03/2017 03:15 PM       Latest Ref Rng & Units 05/29/2021    3:14 PM 04/28/2021    1:53 PM 02/27/2020   11:53 AM  BMP  Glucose 70 - 99 mg/dL 94   96   90    BUN 8 - 23 mg/dL '14   14   10    '$ Creatinine 0.44 - 1.00 mg/dL 0.73   0.71   0.73    Sodium 135 - 145 mmol/L 134   135   131    Potassium 3.5 - 5.1 mmol/L 4.1   4.1   3.8    Chloride 98 - 111 mmol/L 99   99   97    CO2 22 - 32 mmol/L '29   31   29    '$ Calcium 8.9 - 10.3 mg/dL 9.9   9.7   9.6      Current hypotensive regimen:  Midodrine 5 mg 2 tablets at 7 am, 1 tablet at 11am and 1 tablet at 3 pm   Current home BP readings:   What recent interventions/DTPs have been made by any provider to improve Blood Pressure control since last CPP Visit:   Increase Midodrine 5 mg   Patient is to be taking more often than once daily.   Notes: Spoke with patient, she states she has been taking Midodrine 5 mg 1 pill twice daily.   Patients husband is checking blood pressures daily.  Her recent readings are 09/30/21 90/53, 09/29/21 93/66, 09/28/21 161/82, 09/27/21 194/97, 09/26/21 129/79,  09/25/21 97/56 and 09/24/21 94/71    Care Gaps: AWV - message sent to Ramond Craver Last BP - 78/50 on 07/15/2021 Covid booster - overdue TDAP - overdue   Star Rating Drug: None  [place] Pharmacist Assistant [phone]

## 2021-10-14 ENCOUNTER — Telehealth: Payer: Self-pay | Admitting: Family Medicine

## 2021-10-14 NOTE — Telephone Encounter (Signed)
Left message for patient to call back and schedule Medicare Annual Wellness Visit (AWV) either virtually or in office. Left  my Herbie Drape number [phone]   Last AWV ;10/14/20  please schedule at anytime with [facility] Nurse Health Advisor 1 or 2

## 2021-10-20 ENCOUNTER — Other Ambulatory Visit: Payer: Self-pay | Admitting: Neurology

## 2021-10-20 DIAGNOSIS — G20 Parkinson's disease: Secondary | ICD-10-CM

## 2021-10-20 DIAGNOSIS — R4182 Altered mental status, unspecified: Secondary | ICD-10-CM

## 2021-10-28 ENCOUNTER — Ambulatory Visit (INDEPENDENT_AMBULATORY_CARE_PROVIDER_SITE_OTHER): Payer: Medicare HMO

## 2021-10-28 DIAGNOSIS — I428 Other cardiomyopathies: Secondary | ICD-10-CM | POA: Diagnosis not present

## 2021-10-29 LAB — CUP PACEART REMOTE DEVICE CHECK
Battery Impedance: 1756 Ohm
Battery Remaining Longevity: 39 mo
Battery Voltage: 2.76 V
Brady Statistic AP VP Percent: 17 %
Brady Statistic AP VS Percent: 0 %
Brady Statistic AS VP Percent: 82 %
Brady Statistic AS VS Percent: 0 %
Date Time Interrogation Session: 20230606104532
Implantable Lead Implant Date: 20131014
Implantable Lead Implant Date: 20131014
Implantable Lead Location: 753859
Implantable Lead Location: 753860
Implantable Lead Model: 5076
Implantable Lead Model: 5076
Implantable Pulse Generator Implant Date: 20131014
Lead Channel Impedance Value: 439 Ohm
Lead Channel Impedance Value: 682 Ohm
Lead Channel Pacing Threshold Amplitude: 0.5 V
Lead Channel Pacing Threshold Amplitude: 0.75 V
Lead Channel Pacing Threshold Pulse Width: 0.4 ms
Lead Channel Pacing Threshold Pulse Width: 0.4 ms
Lead Channel Setting Pacing Amplitude: 2 V
Lead Channel Setting Pacing Amplitude: 2.5 V
Lead Channel Setting Pacing Pulse Width: 0.4 ms
Lead Channel Setting Sensing Sensitivity: 4 mV

## 2021-11-06 NOTE — Progress Notes (Signed)
Remote pacemaker transmission.   

## 2021-11-10 ENCOUNTER — Ambulatory Visit (INDEPENDENT_AMBULATORY_CARE_PROVIDER_SITE_OTHER): Payer: Medicare HMO

## 2021-11-10 VITALS — Ht 62.0 in | Wt 108.0 lb

## 2021-11-10 DIAGNOSIS — Z0000 Encounter for general adult medical examination without abnormal findings: Secondary | ICD-10-CM | POA: Diagnosis not present

## 2021-11-10 NOTE — Progress Notes (Signed)
Subjective:   Lindsey Bryant is a 81 y.o. female who presents for Medicare Annual (Subsequent) preventive examination.  Review of Systems    Virtual Visit via Telephone Note  I connected with  Lindsey Bryant on 11/10/21 at  1:30 PM EDT by telephone and verified that I am speaking with the correct person using two identifiers.  Location: Patient: Home Provider: Office Persons participating in the virtual visit: patient/Nurse Health Advisor   I discussed the limitations, risks, security and privacy concerns of performing an evaluation and management service by telephone and the availability of in person appointments. The patient expressed understanding and agreed to proceed.  Interactive audio and video telecommunications were attempted between this nurse and patient, however failed, due to patient having technical difficulties OR patient did not have access to video capability.  We continued and completed visit with audio only.  Some vital signs may be absent or patient reported.   Lindsey Peaches, LPN  Cardiac Risk Factors include: advanced age (>70mn, >>23women)     Objective:    Today's Vitals   11/10/21 1334  Weight: 108 lb (49 kg)  Height: '5\' 2"'$  (1.575 m)   Body mass index is 19.75 kg/m.     11/10/2021    1:44 PM 06/02/2021    3:18 PM 05/29/2021    2:47 PM 05/09/2021    5:44 PM 11/13/2020    3:38 PM 10/14/2020    3:21 PM 07/14/2020    2:11 PM  Advanced Directives  Does Patient Have a Medical Advance Directive? Yes Yes Yes No Yes Yes Yes  Type of [place]amed[place] will Living will   [place] will;Out of facility DNR (pink MOST or yellow form) [place]Living will [place] will  Does patient want to make changes to medical advance directive? No - Patient declined        Copy of [place] Chart? No - copy requested     No - copy requested No -  copy requested  Would patient like information on creating a medical advance directive?    No - Patient declined       Current Medications (verified) Outpatient Encounter Medications as of 11/10/2021  Medication Sig   aspirin EC 81 MG tablet Take 81 mg by mouth daily. Swallow whole.   carbidopa-levodopa (SINEMET IR) 25-100 MG tablet Take 2 tablets at 6/9/noon/3pm/6pm/9pm   dicyclomine (BENTYL) 10 MG capsule TAKE 1 CAPSULE BY MOUTH EVERY 6 HOURS AS NEEDED   melatonin 5 MG TABS Take 5 mg by mouth at bedtime.   midodrine (PROAMATINE) 5 MG tablet 2 TABLETS AT 7AM, 1 TABLET AT 11AM, 1 TABLET AT 3PM   polyethylene glycol powder (GLYCOLAX/MIRALAX) 17 GM/SCOOP powder Take 17 g by mouth daily.   Probiotic Product (PROBIOTIC PO) Take 1 capsule by mouth daily as needed.   Propylene Glycol (SYSTANE COMPLETE OP) Apply 1 drop to eye daily.   Simethicone (GAS-X PO) Take 1 tablet by mouth as needed. After meals and at bedtime   VITAMIN E PO Take 1 capsule by mouth daily.   No facility-administered encounter medications on file as of 11/10/2021.    Allergies (verified) Patient has no known allergies.   History: Past Medical History:  Diagnosis Date   Acute systolic heart failure (HCC)    Arm fracture    Bradycardia    a. 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   Parkinson's disease    Past Surgical History:  Procedure Laterality Date   APPENDECTOMY  1952   COLONOSCOPY     EYE SURGERY  07/2017   Cataract Sx   LEFT HEART CATH AND CORONARY ANGIOGRAPHY N/A 08/03/2016   Procedure: Left Heart Cath and Coronary Angiography;  Surgeon: Leonie Man, MD;  Location: [facility];  Service: Cardiovascular;  Laterality: N/A;   MELANOMA EXCISION Right 2000   right arm   PACEMAKER PLACEMENT  05/2012   PACEMAKER REVISION N/A 03/14/2012   Procedure:  PACEMAKER REVISION;  Surgeon: Thompson Grayer, MD;  Location: [facility] CATH LAB;  Service: Cardiovascular;  Laterality: N/A;   PERMANENT PACEMAKER INSERTION N/A 03/06/2012   Procedure: PERMANENT PACEMAKER INSERTION;  Surgeon: Deboraha Sprang, MD;  Location: [facility] CATH LAB;  Service: Cardiovascular;  Laterality: N/A;   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   Family History  Problem Relation Age of Onset   Heart attack Mother 11       Details unclear   Alcohol abuse Father    Breast cancer Sister 86   Diabetes Brother    Hypothyroidism Daughter    Hypothyroidism Daughter    Breast cancer Other    Colon cancer Neg Hx    Esophageal cancer Neg Hx    Stomach cancer Neg Hx    Pancreatic cancer Neg Hx    Liver disease Neg Hx    Social History   Socioeconomic History   Marital status: Married    Spouse name: Not on file   Number of children: 2   Years of education: Not on file   Highest education level: Not on file  Occupational History   Not on file  Tobacco Use   Smoking status: Former   Smokeless tobacco: Never   Tobacco comments:    Smoked cigarettes for 15 yrs   Vaping Use   Vaping Use: Never used  Substance and Sexual Activity   Alcohol use: No    Comment: recovering alcoholic   Drug use: No   Sexual activity: Not on file  Other Topics Concern   Not on file  Social History Narrative   Retired Pharmacist, hospital, married. 2 daughters I think.   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      Right Handed   Social Determinants of Health   Financial Resource Strain: Low Risk  (11/10/2021)   Overall Financial Resource Strain (CARDIA)    Difficulty of Paying Living Expenses: Not hard at all  Food Insecurity: No Food Insecurity (11/10/2021)   Hunger Vital Sign    Worried About Running Out of Food in the Last Year: Never true    Ran Out of Food in the Last Year: Never true  Transportation Needs: No Transportation Needs (11/10/2021)   PRAPARE - Civil engineer, contracting (Medical): No    Lack of Transportation (Non-Medical): No  Physical Activity: Insufficiently Active (11/10/2021)   Exercise Vital Sign    Days of Exercise per Week: 5 days    Minutes of Exercise per Session: 20 min  Stress: No Stress Concern Present (11/10/2021)   [place]    Feeling of Stress : Not at all  Social Connections: [place] (11/10/2021)  Social Licensed conveyancer [NHANES]    Frequency of Communication with Friends and Family: More than three times a week    Frequency of Social Gatherings with Friends and Family: More than three times a week    Attends Religious Services: More than 4 times per year    Active Member of [employer] or Organizations: Yes    Attends Music therapist: More than 4 times per year    Marital Status: Married    Tobacco Counseling Counseling given: Not Answered Tobacco comments: Smoked cigarettes for 15 yrs    Clinical Intake:   Diabetic?  No  Activities of Daily Living    11/10/2021    1:42 PM  In your present state of health, do you have any difficulty performing the following activities:  Hearing? 0  Vision? 0  Difficulty concentrating or making decisions? 0  Walking or climbing stairs? 0  Dressing or bathing? 0  Doing errands, shopping? 0  Preparing Food and eating ? N  Using the Toilet? N  In the past six months, have you accidently leaked urine? N  Do you have problems with loss of bowel control? N  Managing your Medications? N  Managing your Finances? N  Housekeeping or managing your Housekeeping? N    Patient Care Team: [name], MD as PCP - General (Family Medicine) Deboraha Sprang, MD as PCP - Cardiology (Cardiology) Deboraha Sprang, MD as PCP - Electrophysiology (Cardiology) Sherlyn Lees, MD as Referring Physician (Neurology) Tat, Eustace Quail, DO as Consulting Physician (Neurology) Viona Gilmore, [facility] as Pharmacist (Pharmacist)  Indicate any recent Medical Services you may have received from other than [facility] providers in the past year (date may be approximate).     Assessment:   This is a routine wellness examination for Naina.  Hearing/Vision screen Hearing Screening - Comments:: No hearing difficulty Vision Screening - Comments:: Wears glasses. Followed by Dr Twanna Hy  Dietary issues and exercise activities discussed: Exercise limited by: None identified   Goals Addressed               This Visit's Progress     No current goals (pt-stated)         Depression Screen    11/10/2021    1:40 PM 07/15/2021   11:21 AM 10/14/2020    3:22 PM 10/14/2020    3:21 PM 03/20/2018    3:12 PM 07/17/2014   10:23 AM 07/17/2014   10:08 AM  PHQ 2/9 Scores  PHQ - 2 Score 0 0 0 0 0 0 0    Fall Risk    11/10/2021    1:42 PM 07/15/2021   11:21 AM 06/02/2021    3:18 PM 11/13/2020    3:38 PM 10/14/2020    3:22 PM  [place] in the past year? '1 1 1 '$ 0 0  Number falls in past yr: 0 0 0 0 0  Injury with Fall? '1 1 1 '$ 0 0  Comment Fx rt arm. Followed by Waldemar Dickens.      Risk for fall due to : Orthopedic patient History of fall(s)   History of fall(s)  Follow up  Falls evaluation completed   Falls evaluation completed    [place]:  Any stairs  in or around the home? Yes If so, are there any without handrails? No  Home free of loose throw rugs in walkways, pet beds, electrical cords, etc? Yes  Adequate lighting in your home to reduce risk of falls? Yes   ASSISTIVE DEVICES UTILIZED TO PREVENT FALLS:  Life alert? No  Use of a cane, walker or w/c? No  Grab bars in the bathroom? Yes  Shower chair or bench in shower? Yes  Elevated toilet seat or a handicapped toilet? No   TIMED UP AND GO:  Was the test performed? No . Audio Visit  Cognitive Function:      Immunizations Immunization History  Administered Date(s) Administered    Influenza Split 03/03/2012   Influenza Whole 03/24/2007, 02/26/2008, 03/13/2010   Influenza, High Dose Seasonal PF 03/09/2013, 02/17/2015, 01/29/2017, 02/08/2018, 02/19/2019, 03/03/2021   Influenza, Seasonal, Injecte, Preservative Fre 03/10/2014   Influenza-Unspecified 02/24/2016   Moderna Sars-Covid-2 Vaccination 06/23/2019, 07/21/2019   Pneumococcal Conjugate-13 07/17/2014   Pneumococcal Polysaccharide-23 05/24/2006   Tdap 06/04/2011   Zoster Recombinat (Shingrix) 04/06/2018, 06/15/2018, 07/21/2018   Zoster, Live 02/26/2008    TDAP status: Due, Education has been provided regarding the importance of this vaccine. Advised may receive this vaccine at local pharmacy or Health Dept. Aware to provide a copy of the vaccination record if obtained from local pharmacy or Health Dept. Verbalized acceptance and understanding.  Flu Vaccine status: Up to date  Pneumococcal vaccine status: Up to date  Covid-19 vaccine status: Completed vaccines  Qualifies for Shingles Vaccine? Yes   Zostavax completed Yes   Shingrix Completed?: Yes  Screening Tests Health Maintenance  Topic Date Due   COVID-19 Vaccine (3 - Moderna risk series) 11/26/2021 (Originally 08/18/2019)   TETANUS/TDAP  11/11/2022 (Originally 06/03/2021)   INFLUENZA VACCINE  12/22/2021   DEXA SCAN  Completed   Zoster Vaccines- Shingrix  Completed   HPV VACCINES  Aged Out   Pneumonia Vaccine 48+ Years old  Discontinued    Health Maintenance  There are no preventive care reminders to display for this patient.   Colorectal cancer screening: No longer required.   Mammogram status: No longer required due to Age.  Bone Density status: Completed 06/12/18. Results reflect: Bone density results: OSTEOPOROSIS. Repeat every   years.  Lung Cancer Screening: (Low Dose CT Chest recommended if Age 64-80 years, 30 pack-year currently smoking OR have quit w/in 15years.) does not qualify.     Additional Screening:  Hepatitis C  Screening: does not qualify; Completed   Vision Screening: Recommended annual ophthalmology exams for early detection of glaucoma and other disorders of the eye. Is the patient up to date with their annual eye exam?  Yes  Who is the provider or what is the name of the office in which the patient attends annual eye exams? Dr Valetta Close If pt is not established with a provider, would they like to be referred to a provider to establish care? No .   Dental Screening: Recommended annual dental exams for proper oral hygiene  Community Resource Referral / Chronic Care Management:  CRR required this visit?  No   CCM required this visit?  No      Plan:     I have personally reviewed and noted the following in the patient's chart:   Medical and social history Use of alcohol, tobacco or illicit drugs  Current medications and supplements including opioid prescriptions.  Functional ability and status Nutritional status Physical activity Advanced directives List of other physicians Hospitalizations, surgeries, and ER visits in previous 12 months Vitals Screenings to include cognitive, depression, and falls Referrals and appointments  In addition, I have reviewed and discussed with patient certain preventive protocols,  quality metrics, and best practice recommendations. A written personalized care plan for preventive services as well as general preventive health recommendations were provided to patient.     Lindsey Peaches, LPN   [id number]   Nurse Notes: None

## 2021-11-10 NOTE — Patient Instructions (Addendum)
Lindsey Bryant , Thank you for taking time to come for your Medicare Wellness Visit. I appreciate your ongoing commitment to your health goals. Please review the following plan we discussed and let me know if I can assist you in the future.   These are the goals we discussed:  Goals       No current goals (pt-stated)        This is a list of the screening recommended for you and due dates:  Health Maintenance  Topic Date Due   COVID-19 Vaccine (3 - Moderna risk series) 11/26/2021*   Tetanus Vaccine  11/11/2022*   Flu Shot  12/22/2021   DEXA scan (bone density measurement)  Completed   Zoster (Shingles) Vaccine  Completed   HPV Vaccine  Aged Out   Pneumonia Vaccine  Discontinued  *Topic was postponed. The date shown is not the original due date.    Advanced directives: Yes  Conditions/risks identified: None  Next appointment: Follow up in one year for your annual wellness visit     Preventive Care 65 Years and Older, Female Preventive care refers to lifestyle choices and visits with your health care provider that can promote health and wellness. What does preventive care include? A yearly physical exam. This is also called an annual well check. Dental exams once or twice a year. Routine eye exams. Ask your health care provider how often you should have your eyes checked. Personal lifestyle choices, including: Daily care of your teeth and gums. Regular physical activity. Eating a healthy diet. Avoiding tobacco and drug use. Limiting alcohol use. Practicing safe sex. Taking low-dose aspirin every day. Taking vitamin and mineral supplements as recommended by your health care provider. What happens during an annual well check? The services and screenings done by your health care provider during your annual well check will depend on your age, overall health, lifestyle risk factors, and family history of disease. Counseling  Your health care provider may ask you questions about  your: Alcohol use. Tobacco use. Drug use. Emotional well-being. Home and relationship well-being. Sexual activity. Eating habits. History of falls. Memory and ability to understand (cognition). Work and work Statistician. Reproductive health. Screening  You may have the following tests or measurements: Height, weight, and BMI. Blood pressure. Lipid and cholesterol levels. These may be checked every 5 years, or more frequently if you are over 34 years old. Skin check. Lung cancer screening. You may have this screening every year starting at age 80 if you have a 30-pack-year history of smoking and currently smoke or have quit within the past 15 years. Fecal occult blood test (FOBT) of the stool. You may have this test every year starting at age 80. Flexible sigmoidoscopy or colonoscopy. You may have a sigmoidoscopy every 5 years or a colonoscopy every 10 years starting at age 43. Hepatitis C blood test. Hepatitis B blood test. Sexually transmitted disease (STD) testing. Diabetes screening. This is done by checking your blood sugar (glucose) after you have not eaten for a while (fasting). You may have this done every 1-3 years. Bone density scan. This is done to screen for osteoporosis. You may have this done starting at age 80. Mammogram. This may be done every 1-2 years. Talk to your health care provider about how often you should have regular mammograms. Talk with your health care provider about your test results, treatment options, and if necessary, the need for more tests. Vaccines  Your health care provider may recommend certain vaccines, such  as: Influenza vaccine. This is recommended every year. Tetanus, diphtheria, and acellular pertussis (Tdap, Td) vaccine. You may need a Td booster every 10 years. Zoster vaccine. You may need this after age 80. Pneumococcal 13-valent conjugate (PCV13) vaccine. One dose is recommended after age 80. Pneumococcal polysaccharide (PPSV23) vaccine.  One dose is recommended after age 80. Talk to your health care provider about which screenings and vaccines you need and how often you need them. This information is not intended to replace advice given to you by your health care provider. Make sure you discuss any questions you have with your health care provider. Document Released: 06/06/2015 Document Revised: 01/28/2016 Document Reviewed: 03/11/2015 Elsevier Interactive Patient Education  2017 [place] Prevention in the Home Falls can cause injuries. They can happen to people of all ages. There are many things you can do to make your home safe and to help prevent falls. What can I do on the outside of my home? Regularly fix the edges of walkways and driveways and fix any cracks. Remove anything that might make you trip as you walk through a door, such as a raised step or threshold. Trim any bushes or trees on the path to your home. Use bright outdoor lighting. Clear any walking paths of anything that might make someone trip, such as rocks or tools. Regularly check to see if handrails are loose or broken. Make sure that both sides of any steps have handrails. Any raised decks and porches should have guardrails on the edges. Have any leaves, snow, or ice cleared regularly. Use sand or salt on walking paths during winter. Clean up any spills in your garage right away. This includes oil or grease spills. What can I do in the bathroom? Use night lights. Install grab bars by the toilet and in the tub and shower. Do not use towel bars as grab bars. Use non-skid mats or decals in the tub or shower. If you need to sit down in the shower, use a plastic, non-slip stool. Keep the floor dry. Clean up any water that spills on the floor as soon as it happens. Remove soap buildup in the tub or shower regularly. Attach bath mats securely with double-sided non-slip rug tape. Do not have throw rugs and other things on the floor that can make  you trip. What can I do in the bedroom? Use night lights. Make sure that you have a light by your bed that is easy to reach. Do not use any sheets or blankets that are too big for your bed. They should not hang down onto the floor. Have a firm chair that has side arms. You can use this for support while you get dressed. Do not have throw rugs and other things on the floor that can make you trip. What can I do in the kitchen? Clean up any spills right away. Avoid walking on wet floors. Keep items that you use a lot in easy-to-reach places. If you need to reach something above you, use a strong step stool that has a grab bar. Keep electrical cords out of the way. Do not use floor polish or wax that makes floors slippery. If you must use wax, use non-skid floor wax. Do not have throw rugs and other things on the floor that can make you trip. What can I do with my stairs? Do not leave any items on the stairs. Make sure that there are handrails on both sides of the stairs and use  them. Fix handrails that are broken or loose. Make sure that handrails are as long as the stairways. Check any carpeting to make sure that it is firmly attached to the stairs. Fix any carpet that is loose or worn. Avoid having throw rugs at the top or bottom of the stairs. If you do have throw rugs, attach them to the floor with carpet tape. Make sure that you have a light switch at the top of the stairs and the bottom of the stairs. If you do not have them, ask someone to add them for you. What else can I do to help prevent falls? Wear shoes that: Do not have high heels. Have rubber bottoms. Are comfortable and fit you well. Are closed at the toe. Do not wear sandals. If you use a stepladder: Make sure that it is fully opened. Do not climb a closed stepladder. Make sure that both sides of the stepladder are locked into place. Ask someone to hold it for you, if possible. Clearly mark and make sure that you can  see: Any grab bars or handrails. First and last steps. Where the edge of each step is. Use tools that help you move around (mobility aids) if they are needed. These include: Canes. Walkers. Scooters. Crutches. Turn on the lights when you go into a dark area. Replace any light bulbs as soon as they burn out. Set up your furniture so you have a clear path. Avoid moving your furniture around. If any of your floors are uneven, fix them. If there are any pets around you, be aware of where they are. Review your medicines with your doctor. Some medicines can make you feel dizzy. This can increase your chance of falling. Ask your doctor what other things that you can do to help prevent falls. This information is not intended to replace advice given to you by your health care provider. Make sure you discuss any questions you have with your health care provider. Document Released: 03/06/2009 Document Revised: 10/16/2015 Document Reviewed: 06/14/2014 Elsevier Interactive Patient Education  2017 [employer].

## 2021-11-23 ENCOUNTER — Other Ambulatory Visit: Payer: Self-pay | Admitting: Neurology

## 2021-11-23 DIAGNOSIS — R4182 Altered mental status, unspecified: Secondary | ICD-10-CM

## 2021-11-23 DIAGNOSIS — G20 Parkinson's disease: Secondary | ICD-10-CM

## 2021-11-25 ENCOUNTER — Other Ambulatory Visit: Payer: Self-pay

## 2021-11-25 ENCOUNTER — Other Ambulatory Visit: Payer: Self-pay | Admitting: Neurology

## 2021-11-25 DIAGNOSIS — R4182 Altered mental status, unspecified: Secondary | ICD-10-CM

## 2021-11-25 DIAGNOSIS — G20 Parkinson's disease: Secondary | ICD-10-CM

## 2021-11-25 MED ORDER — MIDODRINE HCL 5 MG PO TABS: ORAL_TABLET | ORAL | 0 refills | Status: DC

## 2021-12-02 ENCOUNTER — Other Ambulatory Visit: Payer: Self-pay | Admitting: Internal Medicine

## 2021-12-03 ENCOUNTER — Telehealth: Payer: Self-pay | Admitting: Pharmacist

## 2021-12-03 NOTE — Chronic Care Management (AMB) (Signed)
Chronic Care Management Pharmacy Assistant   Name: Lindsey Bryant  MRN: [id number] DOB: 12-08-1940  Reason for Encounter: Disease State / Hypotension Assessment Call   Conditions to be addressed/monitored: Hypotension  Recent office visits:  11/10/2021 Rolene Arbour LPN - Encounter for Medicare annual wellness exam  Recent consult visits:  None  Hospital visits:  None  Medications: Outpatient Encounter Medications as of 12/03/2021  Medication Sig   aspirin EC 81 MG tablet Take 81 mg by mouth daily. Swallow whole.   carbidopa-levodopa (SINEMET IR) 25-100 MG tablet TAKE 2 TABLETS AT 6/9/NOON/3PM/6PM/9PM   dicyclomine (BENTYL) 10 MG capsule TAKE 1 CAPSULE BY MOUTH EVERY 6 HOURS AS NEEDED   melatonin 5 MG TABS Take 5 mg by mouth at bedtime.   midodrine (PROAMATINE) 5 MG tablet TAKE 1 TABLET BY MOUTH TWO TIMES DAILY WITH A MEAL.   midodrine (PROAMATINE) 5 MG tablet 2 TABLETS AT 7AM, 1 TABLET AT 11AM, 1 TABLET AT 3PM   polyethylene glycol powder (GLYCOLAX/MIRALAX) 17 GM/SCOOP powder Take 17 g by mouth daily.   Probiotic Product (PROBIOTIC PO) Take 1 capsule by mouth daily as needed.   Propylene Glycol (SYSTANE COMPLETE OP) Apply 1 drop to eye daily.   Simethicone (GAS-X PO) Take 1 tablet by mouth as needed. After meals and at bedtime   VITAMIN E PO Take 1 capsule by mouth daily.   No facility-administered encounter medications on file as of 12/03/2021.  Fill History: CARBIDOPA-LEVODOPA 25-100 TAB 09/25/2021 90   DICYCLOMINE '10MG'$  CAP 11/23/2021 15   MIDODRINE '5MG'$  TAB 08/07/2021 90   Reviewed chart prior to disease state call. Spoke with patient regarding BP  Recent Office Vitals: BP Readings from Last 3 Encounters:  07/15/21 (!) 78/50  06/12/21 124/62  06/02/21 136/82   Pulse Readings from Last 3 Encounters:  07/15/21 81  06/12/21 73  06/02/21 83    Wt Readings from Last 3 Encounters:  11/10/21 108 lb (49 kg)  07/15/21 106 lb 14.4 oz (48.5 kg)  06/12/21 110 lb 8  oz (50.1 kg)     Kidney Function Lab Results  Component Value Date/Time   CREATININE 0.73 05/29/2021 03:14 PM   CREATININE 0.71 04/28/2021 01:53 PM   CREATININE 0.73 02/27/2020 11:53 AM   CREATININE 0.78 12/26/2019 02:54 PM   GFR 80.69 04/28/2021 01:53 PM   GFRNONAA >60 05/29/2021 03:14 PM   GFRAA 90 01/03/2017 03:15 PM       Latest Ref Rng & Units 05/29/2021    3:14 PM 04/28/2021    1:53 PM 02/27/2020   11:53 AM  BMP  Glucose 70 - 99 mg/dL 94  96  90   BUN 8 - 23 mg/dL '14  14  10   '$ Creatinine 0.44 - 1.00 mg/dL 0.73  0.71  0.73   Sodium 135 - 145 mmol/L 134  135  131   Potassium 3.5 - 5.1 mmol/L 4.1  4.1  3.8   Chloride 98 - 111 mmol/L 99  99  97   CO2 22 - 32 mmol/L '29  31  29   '$ Calcium 8.9 - 10.3 mg/dL 9.9  9.7  9.6    Current hypotensive regimen:  Midodrine 5 mg 2 TABLETS AT 7AM, 1 TABLET AT 11AM, 1 TABLET AT 3PM  Current home BP readings: Patient states she has been taking Midodrine as prescribed. She states she has not been checking her blood pressures and does not have any readings.  Patient was advised to check her blood  pressures at least weekly and keep a log to review at her follow up visit.    What recent interventions/DTPs have been made by any provider to improve Blood Pressure control since last CPP Visit:   Increase Midodrine 5 mg  to 2 tabs at 7 AM, 1 tab at 11 AM and 1 tab at 3 PM     Care Gaps: AWV - scheduled 11/15/2022 Last BP - 78/50 on 07/15/2021 Covid booster - overdue Tdap - postponed  Star Rating Drugs: None  [place] Pharmacist Assistant [phone]

## 2021-12-14 ENCOUNTER — Other Ambulatory Visit: Payer: Self-pay | Admitting: Internal Medicine

## 2021-12-14 NOTE — Progress Notes (Unsigned)
Assessment/Plan:   1.  Parkinsons Disease, sx's since 2006  -continue carbidopa/levodopa 25/100, 2 po 6 times per day (6/9/noon/3pm/6pm/9pm)  -discussed getting back to exercise  -declined PT.  Discussed aqua therapy as well  -increase exercise    2.  Neurogenic Orthostatic Hypotension  -Continue midodrine, 5 mg, 2 in the AM, 1 in the afternoon and evening.    -monitor BP at home  3.  Abdominal pain with hx of IBS (constipation and diarrhea)  -told her to make f/u with Dr. [name]  -discussed anticholinergic s/e with bentyl.  She is only using occasionally.  4.  Few episodes MS change  -suspect due to Neurogenic Orthostatic Hypotension  5.  Fall with pathologic fracture of the right humerus  -Patient was following to make sure pathologic fracture was not caused by metastatic disease/hematologic disease, but ultimately refused further work-up by oncology.   Subjective:   Lindsey Bryant was seen today in follow up for Parkinsons disease.  My previous records were reviewed prior to todays visit as well as outside records available to me.  Patient with spouse who supplements the history.  Low-dose mirtazapine started last visit for both insomnia and because of her history of weight loss.  She emailed me back a few weeks later stating that she had stopped it because she was still waking up with the urge to urinate and stated that she never had any trouble sleeping.  She was told that she would need to follow-up with urology or urogynecology and to let us know if she needed a referral.  She has been following with Dr. Elease Hashimoto.  She has been experiencing orthostatic hypotension.  Blood pressure at his office was only 78/50 in the seated position, which was surprisingly not symptomatic!  It appears that she was not taking her midodrine at the time and she was told to restart it.  She reports today that she was taking the midodrine but was only taking it bid at the time.  She is now taking  2/1/1.  She isn't having much lightheadedness now.  BP is not running low at home.  It runs max 180 but it normally runs 160 or under.  Not exercising much.  Husband had some surgeries so she couldn't drive her to gym.    Current prescribed movement disorder medications:  Carbidopa/levodopa 25/100, 2 tablets 6 times per day (6:30am, 9:30am, 12:30pm, 3:30, 6:30, 10:30pm)  Midodrine, 5 mg,2 in the morning, 1 in the afternoon and 1 in the evening Mirtazapine, 7.5 mg at bedtime  Prior medications: Mirtazapine, 7.5 mg at bedtime (I started her on that for sleep and to help weight gain, but her sleep issues were really from nocturia and she stopped it); Myrbetriq  ALLERGIES:  No Known Allergies  CURRENT MEDICATIONS:  Outpatient Encounter Medications as of 12/15/2021  Medication Sig   aspirin EC 81 MG tablet Take 81 mg by mouth daily. Swallow whole.   carbidopa-levodopa (SINEMET IR) 25-100 MG tablet TAKE 2 TABLETS AT 6/9/NOON/3PM/6PM/9PM   dicyclomine (BENTYL) 10 MG capsule TAKE 1 CAPSULE BY MOUTH EVERY 6 HOURS AS NEEDED   melatonin 5 MG TABS Take 5 mg by mouth at bedtime.   midodrine (PROAMATINE) 5 MG tablet 2 TABLETS AT 7AM, 1 TABLET AT 11AM, 1 TABLET AT 3PM   polyethylene glycol powder (GLYCOLAX/MIRALAX) 17 GM/SCOOP powder Take 17 g by mouth daily.   Probiotic Product (PROBIOTIC PO) Take 1 capsule by mouth daily as needed.   Propylene Glycol (SYSTANE  COMPLETE OP) Apply 1 drop to eye daily.   Simethicone (GAS-X PO) Take 1 tablet by mouth as needed. After meals and at bedtime   VITAMIN E PO Take 1 capsule by mouth daily.   [DISCONTINUED] midodrine (PROAMATINE) 5 MG tablet TAKE 1 TABLET BY MOUTH TWO TIMES DAILY WITH A MEAL.   [DISCONTINUED] dicyclomine (BENTYL) 10 MG capsule TAKE 1 CAPSULE BY MOUTH EVERY 6 HOURS AS NEEDED   No facility-administered encounter medications on file as of 12/15/2021.    Objective:   PHYSICAL EXAMINATION:    VITALS:   Vitals:   12/15/21 1427  BP: 112/84   Pulse: 66  SpO2: 99%  Weight: 111 lb (50.3 kg)  Height: '5\' 2"'$  (1.575 m)     No data found.    GEN:  The patient appears stated age and is in NAD. HEENT:  Normocephalic, atraumatic.  The mucous membranes are moist. The superficial temporal arteries are without ropiness or tenderness. CV:  RRR Lungs:  CTAB Neck/HEME:  There are no carotid bruits bilaterally.  Neurological examination:  Orientation: The patient is alert and oriented x3. Cranial nerves: There is good facial symmetry with mild facial hypomimia. The speech is fluent and clear. Soft palate rises symmetrically and there is no tongue deviation. Hearing is intact to conversational tone. Sensation: Sensation is intact to light touch throughout Motor: Strength is at least antigravity x4.    Movement examination: Tone: There is nl tone in the ue/le Abnormal movements: there is mild LLE dyskinesia Coordination:  There is mild decremation on the L Gait and Station: The patient arises well.  No shuffling.  Slightly unstable  I have reviewed and interpreted the following labs independently    Chemistry      Component Value Date/Time   NA 134 (L) 05/29/2021 1514   NA 141 01/03/2017 1515   K 4.1 05/29/2021 1514   CL 99 05/29/2021 1514   CO2 29 05/29/2021 1514   BUN 14 05/29/2021 1514   BUN 13 01/03/2017 1515   CREATININE 0.73 05/29/2021 1514   CREATININE 0.78 12/26/2019 1454      Component Value Date/Time   CALCIUM 9.9 05/29/2021 1514   ALKPHOS 89 05/29/2021 1514   AST 11 (L) 05/29/2021 1514   ALT <5 05/29/2021 1514   BILITOT 0.7 05/29/2021 1514       Lab Results  Component Value Date   WBC 8.4 05/29/2021   HGB 10.8 (L) 05/29/2021   HCT 31.9 (L) 05/29/2021   MCV 102.6 (H) 05/29/2021   PLT 282 05/29/2021    Lab Results  Component Value Date   TSH 1.22 04/28/2021     Total time spent on today's visit was 23 minutes, including both face-to-face time and nonface-to-face time.  Time included that spent  on review of records (prior notes available to me/labs/imaging if pertinent), discussing treatment and goals, answering patient's questions and coordinating care.  Cc:  [name], MD

## 2021-12-15 ENCOUNTER — Encounter: Payer: Self-pay | Admitting: Neurology

## 2021-12-15 ENCOUNTER — Ambulatory Visit: Payer: Medicare HMO | Admitting: Neurology

## 2021-12-15 VITALS — BP 112/84 | HR 66 | Ht 62.0 in | Wt 111.0 lb

## 2021-12-15 DIAGNOSIS — G903 Multi-system degeneration of the autonomic nervous system: Secondary | ICD-10-CM

## 2021-12-15 DIAGNOSIS — G249 Dystonia, unspecified: Secondary | ICD-10-CM | POA: Diagnosis not present

## 2021-12-15 DIAGNOSIS — G20 Parkinson's disease: Secondary | ICD-10-CM | POA: Diagnosis not present

## 2021-12-15 NOTE — Patient Instructions (Signed)
Local and Online Resources for Lindsey over Parkinson's Bryant June 2023  LOCAL Startup PARKINSON'S GROUPS  Lindsey over Parkinson's Bryant:   Lindsey Bryant will be Wednesday, June 14th-*Hybrid meting*- in person at [place] [facility] location and via WEBEX at 2:00 pm.   Upcoming Lindsey over Parkinson's Meetings:  2nd Wednesdays of the month at 2 pm:   June 14th, July 12th Contact Amy Marriott at [email or web address] if interested in participating in this Bryant Parkinson's [facility] Bryant:    3rd Mondays, Contact Misty Paladino Atypical Parkinsonian Patient Bryant:   4th Wednesdays, Contact Misty Paladino If you are interested in participating in these groups with Misty, please contact her directly for how to join those meetings.  Her contact information is [email or web address].    LOCAL EVENTS AND NEW OFFERINGS Dance Class for People with Parkinson's at [employer].  Friday, June 9th at 2 pm.  Led by [employer] DPT students.  Contact [email or web address] to register or with questions. Ice Cream Social at [employer]!  Thursday, June 15th, 5:30-7:00 pm.  RSVP to [email or web address] for attendance and free ice cream. Parkinson's T-shirts for sale!  Designed by a local Bryant member, with funds going to Movement Disorders Fund.  $25.00  Contact Misty to purchase  New PWR! Moves Community Fitness Instructor-Led Class offering at [employer]!  Wednesdays 1-2 pm, starting April 12th.   Contact Susan Laney, Fitness Manager at [employer].  [email or web address]  ONLINE EDUCATION AND SUPPORT Parkinson Foundation:  www.parkinson.org PD Health at Home continues:  Mindfulness Mondays, Wellness Wednesdays, Fitness Fridays  Upcoming Education: Parkinson's 101:  What You and Your Family Should Know.  Wednesday, June 7th at 1:00 pm Register for expert briefings (webinars) at  https://www.parkinson.org/resources-support/online-education/expert-briefings-webinars Please check out their website to sign up for emails and see their full online offerings   [employer] Foundation:  www.michaeljfox.org  Third Thursday Webinars:  On the third Thursday of every month at 12 p.m. ET, join our free live webinars to learn about various aspects of living with Parkinson's disease and our work to speed medical breakthroughs. Upcoming Webinar: REPLAY:  From Low Blood Pressure to Bladder Problems:  A Look at Lesser Known Parkinson's Symptoms.  Thursday, June 15th at 12 noon. Check out additional information on their website to see their full online offerings  Davis Phinney Foundation:  www.davisphinneyfoundation.org Upcoming Webinar:   Stay tuned Webinar Series:  Living with Parkinson's Meetup.   Third Thursdays each month, 3 pm Care Partner Monthly Meetup.  With Connie Carpenter Phinney.  First Tuesday of each month, 2 pm Check out additional information to Live Well Today on their website  Parkinson and Movement Disorders (PMD) Alliance:  www.pmdalliance.org [employer] Online:  Online Education Events Sign up for emails, which are sent weekly to give you updates on programming and online offerings  Parkinson's Association of [place]:  www.parkinsonassociation.org Information on online support groups, education events, and online exercises including Yoga, Parkinson's exercises and more-LOTS of information on links to PD resources and online events Virtual Support Bryant through Parkinson's Association of [place]; next one is scheduled for Wednesday, June 7th at 2 pm. (These are typically scheduled for the 1st Wednesday of the month at 2 pm).  Visit website for details. Save the date for "Caring for Parkinson's-Caring for You", 9th Annual Symposium.  In-person event in [place].  September 9th.  More info on registration to come. MOVEMENT AND EXERCISE OPPORTUNITIES PWR!  Moves Classes at [facility].  Wednesdays 10 and 11   am.   Contact Amy Marriott, PT [email or web address] if interested. NEW PWR! Moves Class offering at [employer].  Wednesdays 1-2 pm, starting April 12th.  Contact Susan Laney, Fitness Manager at [employer].  [email or web address] Here is a link to the PWR!Moves classes on Zoom from [place] Parkinson's Foundation - Daily Mon-Sat at 10:00. Via Zoom, FREE and open to all.  There is also a link below via Facebook if you use that platform.  https://www.parkinsonsmi.org/mpf-programs/exercise-and-movement-activities https://www.facebook.com/ParkinsonsMI.org/posts/pwr-moves-exercise-class-parkinson-wellness-recovery-online-with-angee-ludwa-pt-/10156827878021813/  Parkinson's Wellness Recovery (PWR! Moves)  www.pwr4life.org Info on the PWR! Virtual Experience:  You will have access to our expertise through self-assessment, guided plans that start with the PD-specific fundamentals, educational content, tips, Q&A with an expert, and a growing library of PD-specific pre-recorded and live exercise classes of varying types and intensity - both physical and cognitive! If that is not enough, we offer 1:1 wellness consultations (in-person or virtual) to personalize your PWR! Virtual Experience.  Parkinson Foundation Fitness Fridays:  As part of the PD Health @ Home program, this free video series focuses each week on one aspect of fitness designed to support people living with Parkinson's.  These weekly videos highlight the Parkinson Foundation recent fitness guidelines for people with Parkinson's disease. www.parkinson.org/resources-support/online-education/pdhealth#ff Dance for PD website is offering free, live-stream classes throughout the week, as well as links to digital library of classes:  https://danceforparkinsons.org/ Virtual dance and Pilates for Parkinson's classes: Click on the Community Tab> Parkinson's Movement Initiative  Tab.  To register for classes and for more information, visit www.americandancefestival.org and click the "community" tab.  [employer] Parkinson's Cycling Classes  [employer]:  Thursdays @ Noon-Live classes at [employer] (Contact Margaret Hazen at [email or web address] or [phone]) [employer]: Virtual Classes Mondays and Thursdays /Live classes Tuesday, Wednesday and Thursday (contact Marlee at [email or web address]  or [phone]) [place] Steady Boxing Varied levels of classes are offered Tuesdays and Thursdays at [employer].  Stretching with Maria weekly class is also offered for people with Parkinson's To observe a class or for more information, call [phone] or email Hillary Savage at [email or web address] ADDITIONAL SUPPORT AND RESOURCES [employer]:Online Caregiver Education Opportunities:  www.well-springsolutions.org/caregiver-education/caregiver-support-Bryant.  You may also contact Jodi Kolada at [email or web address] or [phone].    [facility] Navigator:  Just1Navigator program, a free service to help individuals and families through the journey of determining care for older adults.  The "Navigator" is a social worker, Nicole Reynolds, who will speak with a prospective client and/or loved ones to provide an assessment of the situation and a set of recommendations for a personalized care plan -- all free of charge, and whether [employer] offers the needed service or not. If the need is not a service we provide, we are well-connected with reputable programs in town that we can refer you to.  www.well-springsolutions.org or to speak with the Navigator, call [phone]. Family Caregiver Programming in June:  [employer], Thursday, June 15th 11-12:30 at [employer], [place].  Call [phone] to register  

## 2022-01-27 ENCOUNTER — Ambulatory Visit (INDEPENDENT_AMBULATORY_CARE_PROVIDER_SITE_OTHER): Payer: Medicare HMO

## 2022-01-27 DIAGNOSIS — I428 Other cardiomyopathies: Secondary | ICD-10-CM | POA: Diagnosis not present

## 2022-02-01 ENCOUNTER — Encounter: Payer: Self-pay | Admitting: Internal Medicine

## 2022-02-01 NOTE — Telephone Encounter (Signed)
Spoke to patient regarding her message.  Please refer to physical therapy for pelvic floor physical therapy ([facility] at [facility]).  Indication is constipation and abdominal pain with suspected pelvic floor dysfunction in the setting of Parkinson's disease

## 2022-02-02 ENCOUNTER — Other Ambulatory Visit: Payer: Self-pay

## 2022-02-02 DIAGNOSIS — R109 Unspecified abdominal pain: Secondary | ICD-10-CM

## 2022-02-02 DIAGNOSIS — K5900 Constipation, unspecified: Secondary | ICD-10-CM

## 2022-02-02 LAB — CUP PACEART REMOTE DEVICE CHECK
Battery Impedance: 1843 Ohm
Battery Remaining Longevity: 38 mo
Battery Voltage: 2.75 V
Brady Statistic AP VP Percent: 18 %
Brady Statistic AP VS Percent: 0 %
Brady Statistic AS VP Percent: 82 %
Brady Statistic AS VS Percent: 0 %
Date Time Interrogation Session: 20230908140300
Implantable Lead Implant Date: 20131014
Implantable Lead Implant Date: 20131014
Implantable Lead Location: 753859
Implantable Lead Location: 753860
Implantable Lead Model: 5076
Implantable Lead Model: 5076
Implantable Pulse Generator Implant Date: 20131014
Lead Channel Impedance Value: 416 Ohm
Lead Channel Impedance Value: 781 Ohm
Lead Channel Pacing Threshold Amplitude: 0.375 V
Lead Channel Pacing Threshold Amplitude: 0.875 V
Lead Channel Pacing Threshold Pulse Width: 0.4 ms
Lead Channel Pacing Threshold Pulse Width: 0.4 ms
Lead Channel Setting Pacing Amplitude: 2 V
Lead Channel Setting Pacing Amplitude: 2.5 V
Lead Channel Setting Pacing Pulse Width: 0.4 ms
Lead Channel Setting Sensing Sensitivity: 4 mV

## 2022-02-02 NOTE — Telephone Encounter (Signed)
Referral was sent for physical therapy for pelvic floor physical therapy ([facility] at [facility]).

## 2022-02-15 NOTE — Progress Notes (Signed)
Remote pacemaker transmission.   

## 2022-02-19 ENCOUNTER — Ambulatory Visit: Payer: Medicare HMO | Attending: Internal Medicine | Admitting: Internal Medicine

## 2022-02-19 ENCOUNTER — Encounter: Payer: Self-pay | Admitting: Internal Medicine

## 2022-02-19 ENCOUNTER — Other Ambulatory Visit: Payer: Self-pay | Admitting: Neurology

## 2022-02-19 VITALS — BP 195/92 | Wt 108.0 lb

## 2022-02-19 DIAGNOSIS — R4182 Altered mental status, unspecified: Secondary | ICD-10-CM

## 2022-02-19 DIAGNOSIS — I428 Other cardiomyopathies: Secondary | ICD-10-CM

## 2022-02-19 DIAGNOSIS — I442 Atrioventricular block, complete: Secondary | ICD-10-CM | POA: Diagnosis not present

## 2022-02-19 DIAGNOSIS — Z950 Presence of cardiac pacemaker: Secondary | ICD-10-CM

## 2022-02-19 DIAGNOSIS — G903 Multi-system degeneration of the autonomic nervous system: Secondary | ICD-10-CM | POA: Diagnosis not present

## 2022-02-19 DIAGNOSIS — G20A1 Parkinson's disease without dyskinesia, without mention of fluctuations: Secondary | ICD-10-CM

## 2022-02-19 NOTE — Patient Instructions (Signed)
Medication Instructions:  Your physician recommends that you continue on your current medications as directed. Please refer to the Current Medication list given to you today.  *If you need a refill on your cardiac medications before your next appointment, please call your pharmacy*   Lab Work: CBC today If you have labs (blood work) drawn today and your tests are completely normal, you will receive your results only by: [place] (if you have MyChart) OR A paper copy in the mail If you have any lab test that is abnormal or we need to change your treatment, we will call you to review the results.   Testing/Procedures: None ordered.    Follow-Up: At [facility], you and your health needs are our priority.  As part of our continuing mission to provide you with exceptional heart care, we have created designated Provider Care Teams.  These Care Teams include your primary Cardiologist (physician) and Advanced Practice Providers (APPs -  Physician Assistants and Nurse Practitioners) who all work together to provide you with the care you need, when you need it.  We recommend signing up for the patient portal called "MyChart".  Sign up information is provided on this After Visit Summary.  MyChart is used to connect with patients for Virtual Visits (Telemedicine).  Patients are able to view lab/test results, encounter notes, upcoming appointments, etc.  Non-urgent messages can be sent to your provider as well.   To learn more about what you can do with MyChart, go to [email or web address].    Your next appointment:   12 months with Dr Caryl Comes  Important Information About Sugar

## 2022-02-19 NOTE — Progress Notes (Signed)
Patient Care Team: [name], MD as PCP - General (Family Medicine) Deboraha Sprang, MD as PCP - Cardiology (Cardiology) Deboraha Sprang, MD as PCP - Electrophysiology (Cardiology) Sherlyn Lees, MD as Referring Physician (Neurology) Tat, Eustace Quail, DO as Consulting Physician (Neurology) Viona [place], [facility] as Pharmacist (Pharmacist)   HPI  Lindsey Bryant is a 81 y.o. 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     Major issues include Parkinson's as an affecting her GI tract.  She is also had problems with orthostatic lightheadedness.    Has had a recent fall with an arm fracture.  Has significant orthostatic lightheadedness, her blood pressure is normally at home are in the 150 or 60 range.  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/2012   Hyperglycemia    IBS (irritable bowel syndrome)    Melanoma ([place])    right arm   NSTEMI (non-ST elevated myocardial infarction) ([place])    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/2012   Parkinson's disease     Past Surgical History:  Procedure Laterality Date   APPENDECTOMY  1952   COLONOSCOPY     EYE SURGERY  07/2017   Cataract Sx   LEFT HEART CATH AND CORONARY ANGIOGRAPHY N/A 08/03/2016   Procedure: Left Heart Cath and Coronary Angiography;  Surgeon: Leonie Man, MD;  Location: [place] [facility];  Service: Cardiovascular;  Laterality: N/A;   MELANOMA EXCISION Right 2000   right arm   PACEMAKER PLACEMENT  05/2012   PACEMAKER REVISION  N/A 03/14/2012   Procedure: PACEMAKER REVISION;  Surgeon: Thompson Grayer, MD;  Location: [facility] CATH LAB;  Service: Cardiovascular;  Laterality: N/A;   PERMANENT PACEMAKER INSERTION N/A 03/06/2012   Procedure: PERMANENT PACEMAKER INSERTION;  Surgeon: Deboraha Sprang, MD;  Location: [facility] CATH LAB;  Service: Cardiovascular;  Laterality: N/A;   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    Current Outpatient Medications  Medication Sig Dispense Refill   aspirin EC 81 MG tablet Take 81 mg by mouth daily. Swallow whole.     carbidopa-levodopa (SINEMET IR) 25-100 MG tablet TAKE 2 TABLETS AT 6/9/NOON/3PM/6PM/9PM 1080 tablet 0   dicyclomine (BENTYL) 10 MG capsule TAKE 1 CAPSULE BY MOUTH EVERY 6 HOURS AS NEEDED 360 capsule 1   melatonin 5 MG TABS Take 5 mg by mouth at bedtime.     midodrine (PROAMATINE) 5 MG tablet 2 TABLETS AT 7AM, 1 TABLET AT 11AM, 1 TABLET AT 3PM 360 tablet 0   polyethylene glycol powder (GLYCOLAX/MIRALAX) 17 GM/SCOOP powder Take 17 g by mouth daily.     Probiotic Product (PROBIOTIC PO) Take 1 capsule by mouth daily as needed.     Propylene Glycol (SYSTANE COMPLETE OP) Apply 1 drop  to eye daily.     Simethicone (GAS-X PO) Take 1 tablet by mouth as needed. After meals and at bedtime     VITAMIN E PO Take 1 capsule by mouth daily.     No current facility-administered medications for this visit.    No Known Allergies  Review of Systems negative except from HPI and PMH  Physical Exam BP (!) 195/92   Wt 108 lb (49 kg)   SpO2 96%   BMI 19.75 kg/m  BP 195/92  HR  Well developed and well nourished in no acute distress HENT normal Neck supple with JVP-flat Clear Device pocket well healed; without hematoma or erythema.  There is no tethering  Regular rate and rhythm, no gallop No  murmur Abd-soft with active BS No Clubbing cyanosis  edema Skin-warm and dry A & Oriented  Grossly normal sensory and motor function  ECG sinsu     Assessment and  Plan  Complete  heart block  Narrow escape  NICM  Post prandial Fatigue/Parkinson's   Pacemaker Medtronic   Orthostatic Hypotension  Hypertension-labile-quiescient  Her orthostatic hypotension continues to be an issue with the falls.  She has systolic hypertension which is significant, but she also has, at last documentation in 2020, greater than 60 mm drop in systolic blood pressure upon standing.  This being the case, she needs to have a systolic blood pressure of 170 or 180 or more to make sure she does not have a standing blood pressure that we will overly predispose her to falling.  She has not tolerated compressive wear.  She is currently on ProAmatine with a recent up titration by Dr. Carles Collet.  I will reach out to Dr. Carles Collet to see whether she might be a candidate for pyridostigmine as an alternative which might allow a decrease in her systolic blood pressure target.  Device function is normal  Lengthy discussion

## 2022-02-20 LAB — CBC
Hematocrit: 34.1 % (ref 34.0–46.6)
Hemoglobin: 11.8 g/dL (ref 11.1–15.9)
MCH: 34 pg — ABNORMAL HIGH (ref 26.6–33.0)
MCHC: 34.6 g/dL (ref 31.5–35.7)
MCV: 98 fL — ABNORMAL HIGH (ref 79–97)
Platelets: 173 10*3/uL (ref 150–450)
RBC: 3.47 x10E6/uL — ABNORMAL LOW (ref 3.77–5.28)
RDW: 12.2 % (ref 11.7–15.4)
WBC: 6 10*3/uL (ref 3.4–10.8)

## 2022-02-22 ENCOUNTER — Telehealth: Payer: Self-pay | Admitting: Pharmacist

## 2022-02-22 NOTE — Chronic Care Management (AMB) (Signed)
    Chronic Care Management Pharmacy Assistant   Name: TIFFANYE HARTMANN  MRN: [id number] DOB: 03/13/1941  02/23/2022 APPOINTMENT REMINDER  Eber Hong was reminded to have all medications, supplements and any blood glucose and blood pressure readings available for review with Jeni Salles, Pharm. D, at her telephone visit on 02/23/2022 at 4:00.  Care Gaps: AWV - scheduled 11/15/2022 Last BP - 195/92 on 02/19/2022 Covid - overdue Flu - due Tdap - postponed  Star Rating Drug: None  Any gaps in medications fill history? No  Gennie Alma [facility]  Catering manager [phone]

## 2022-02-23 ENCOUNTER — Telehealth: Payer: Self-pay | Admitting: Pharmacist

## 2022-02-23 ENCOUNTER — Telehealth: Payer: Medicare HMO

## 2022-02-23 NOTE — Progress Notes (Deleted)
Chronic Care Management Pharmacy Note  02/23/2022 Name:  Lindsey Bryant MRN:  [id number] DOB:  1940-07-21  Summary: BP is fluctuating significantly  Recommendations/Changes made from today's visit: -Recommended checking BP prior to taking midodrine every morning -Reached out to neurologist about BP parameters for taking midodrine -Consider restarting aspirin and statin based on cardiac indication  Plan: Follow up with response from neurology BP assessment in 1 month   Subjective: Lindsey Bryant is an 81 y.o. year old female who is a primary patient of Burchette, Alinda Sierras, MD.  The CCM team was consulted for assistance with disease management and care coordination needs.    Engaged with patient by telephone for follow up visit in response to provider referral for pharmacy case management and/or care coordination services.   Consent to Services:  The patient was given information about Chronic Care Management services, agreed to services, and gave verbal consent prior to initiation of services.  Please see initial visit note for detailed documentation.   Patient Care Team: [name], MD as PCP - General (Family Medicine) Deboraha Sprang, MD as PCP - Cardiology (Cardiology) Deboraha Sprang, MD as PCP - Electrophysiology (Cardiology) Sherlyn Lees, MD as Referring Physician (Neurology) Tat, Eustace Quail, DO as Consulting Physician (Neurology) Viona Gilmore, [facility] as Pharmacist (Pharmacist)  Recent office visits: 11/10/2021 Rolene Arbour LPN - Encounter for Medicare annual wellness exam  07/15/21 [name], MD: Patient presented for hypotension. Recommended getting back on Midodrine TID.  Recent consult visits: 02/19/22 Virl Axe, MD (cardiology): Patient presented for heart block follow up. Plan for CBC and recommended changing to pyridostigmine which might allow a decrease in her systolic blood pressure target.  12/15/21 Alonza Bogus, DO (neurology): Patient  presented for Parkinson's disease follow up. No medication changes. Follow up in 6 months.  06/09/21 Susa Day (ortho): Patient presented for pain in right shoulder. Unable to access notes.  06/02/21 Alonza Bogus, DO (neurology): Patient presented for Parkinson's disease follow up. Prescribed mirtazapine 7.5 mg at bedtime.  05/29/21 Dede Query, PA-C (hem/onc): Patient presented for initial consultation for fracture of right humerus with soft tissue mass.  02/04/21 Alonza Bogus PA-C (Gastroenterology) - seen for bilateral lower abdominal pain. No medication changes. Call in 2 weeks for update.   Hospital visits: Patient was seen at [facility] ED on 05/09/2021 due to Humeral head fracture, right. Discharged after 3 hours.    New?Medications Started at [facility] Discharge:?? -Percocet 5/325 mg - take 1 tablet every 6 hours as needed   Medication Changes at Hospital Discharge: No medication changes.   Medications Discontinued at Hospital Discharge: No medications stopped.    Medications that remain the same after Hospital Discharge:??  -All other medications will remain the same.    Objective:  Lab Results  Component Value Date   CREATININE 0.73 05/29/2021   BUN 14 05/29/2021   GFR 80.69 04/28/2021   GFRNONAA >60 05/29/2021   GFRAA 90 01/03/2017   NA 134 (L) 05/29/2021   K 4.1 05/29/2021   CALCIUM 9.9 05/29/2021   CO2 29 05/29/2021   GLUCOSE 94 05/29/2021    Lab Results  Component Value Date/Time   HGBA1C 5.3 07/29/2016 12:42 PM   GFR 80.69 04/28/2021 01:53 PM   GFR 78.57 02/27/2020 11:53 AM    Last diabetic Eye exam: No results found for: "HMDIABEYEEXA"  Last diabetic Foot exam: No results found for: "HMDIABFOOTEX"   Lab Results  Component Value Date   CHOL 198  04/28/2021   HDL 83.80 04/28/2021   LDLCALC 105 (H) 04/28/2021   LDLDIRECT 111.7 06/05/2012   TRIG 49.0 04/28/2021   CHOLHDL 2 04/28/2021       Latest Ref Rng & Units 05/29/2021    3:14 PM  04/28/2021    1:53 PM 02/27/2020   11:53 AM  Hepatic Function  Total Protein 6.5 - 8.1 g/dL 6.8  6.6  7.0   Albumin 3.5 - 5.0 g/dL 4.1  4.2  4.4   AST 15 - 41 U/L 11  16  15    ALT 0 - 44 U/L <5  3  2    Alk Phosphatase 38 - 126 U/L 89  62  48   Total Bilirubin 0.3 - 1.2 mg/dL 0.7  0.5  0.5   Bilirubin, Direct 0.0 - 0.3 mg/dL  0.1      Lab Results  Component Value Date/Time   TSH 1.22 04/28/2021 01:53 PM   TSH 1.45 12/26/2019 02:54 PM   FREET4 0.84 07/29/2016 12:08 PM       Latest Ref Rng & Units 02/19/2022    4:47 PM 05/29/2021    3:14 PM 04/28/2021    1:53 PM  CBC  WBC 3.4 - 10.8 x10E3/uL 6.0  8.4  5.9   Hemoglobin 11.1 - 15.9 g/dL 11.8  10.8  12.7   Hematocrit 34.0 - 46.6 % 34.1  31.9  38.3   Platelets 150 - 450 x10E3/uL 173  282  172.0     Lab Results  Component Value Date/Time   VD25OH 80 03/24/2009 08:48 PM    Clinical ASCVD: Yes  The ASCVD Risk score (Arnett DK, et al., 2019) failed to calculate for the following reasons:   The 2019 ASCVD risk score is only valid for ages 30 to 51   The patient has a prior MI or stroke diagnosis       11/10/2021    1:40 PM 07/15/2021   11:21 AM 10/14/2020    3:22 PM  Depression screen PHQ 2/9  Decreased Interest 0 0 0  Down, Depressed, Hopeless 0 0 0  PHQ - 2 Score 0 0 0      Social History   Tobacco Use  Smoking Status Former  Smokeless Tobacco Never  Tobacco Comments   Smoked cigarettes for 15 yrs    BP Readings from Last 3 Encounters:  02/19/22 (!) 195/92  12/15/21 112/84  07/15/21 (!) 78/50   Pulse Readings from Last 3 Encounters:  12/15/21 66  07/15/21 81  06/12/21 73   Wt Readings from Last 3 Encounters:  02/19/22 108 lb (49 kg)  12/15/21 111 lb (50.3 kg)  11/10/21 108 lb (49 kg)   BMI Readings from Last 3 Encounters:  02/19/22 19.75 kg/m  12/15/21 20.30 kg/m  11/10/21 19.75 kg/m    Assessment/Interventions: Review of patient past medical history, allergies, medications, health status, including  review of consultants reports, laboratory and other test data, was performed as part of comprehensive evaluation and provision of chronic care management services.   SDOH:  (Social Determinants of Health) assessments and interventions performed: No SDOH Interventions    Flowsheet Row Clinical Support from 11/10/2021 in [place] at [place] from 10/14/2020 in [place] at [place] Management from 12/25/2019 in [place] at [place] Management from 12/11/2019 in [place] at [place] Interventions Intervention Not Indicated Intervention Not Indicated -- --  Housing Interventions Intervention  Not Indicated Intervention Not Indicated -- --  Transportation Interventions Intervention Not Indicated -- Intervention Not Indicated Intervention Not Indicated  Financial Strain Interventions Intervention Not Indicated -- -- Intervention Not Indicated  Physical Activity Interventions Intervention Not Indicated Intervention Not Indicated -- --  Stress Interventions Intervention Not Indicated Intervention Not Indicated -- --  Social Connections Interventions Intervention Not Indicated Intervention Not Indicated -- --      SDOH Screenings   Food Insecurity: No Food Insecurity (11/10/2021)  Housing: Low Risk  (11/10/2021)  Transportation Needs: No Transportation Needs (11/10/2021)  Alcohol Screen: Low Risk  (11/10/2021)  Depression (PHQ2-9): Low Risk  (11/10/2021)  Financial Resource Strain: Low Risk  (11/10/2021)  Physical Activity: Insufficiently Active (11/10/2021)  Social Connections: Socially Integrated (11/10/2021)  Stress: No Stress Concern Present (11/10/2021)  Tobacco Use: Medium Risk (02/19/2022)    [place]  No Known Allergies  Medications Reviewed Today     Reviewed by Michelle Nasuti, [place] (Certified Medical Assistant) on 02/19/22 at 1551  Med List Status: <None>    Medication Order Taking? Sig Documenting Provider Last Dose Status Informant  aspirin EC 81 MG tablet [id number] Yes Take 81 mg by mouth daily. Swallow whole. [provider] Taking Active   carbidopa-levodopa (SINEMET IR) 25-100 MG tablet [id number] Yes TAKE 2 TABLETS AT 6/9/NOON/3PM/6PM/9PM Tat, Eustace Quail, DO Taking Active   dicyclomine (BENTYL) 10 MG capsule [id number] Yes TAKE 1 CAPSULE BY MOUTH EVERY 6 HOURS AS NEEDED Gatha Mayer, MD Taking Active   melatonin 5 MG TABS [id number] Yes Take 5 mg by mouth at bedtime. [provider] Taking Active Self  midodrine (PROAMATINE) 5 MG tablet [id number] Yes 2 TABLETS AT 7AM, 1 TABLET AT 11AM, 1 TABLET AT 3PM Tat, Rebecca S, DO Taking Active   polyethylene glycol powder (GLYCOLAX/MIRALAX) 17 GM/SCOOP powder [id number] Yes Take 17 g by mouth daily. [provider] Taking Active   Probiotic Product (PROBIOTIC PO) [id number] Yes Take 1 capsule by mouth daily as needed. [provider] Taking Active   Propylene Glycol (SYSTANE COMPLETE OP) [id number] Yes Apply 1 drop to eye daily. [provider] Taking Active   Simethicone (GAS-X PO) [id number] Yes Take 1 tablet by mouth as needed. After meals and at bedtime [provider] Taking Active   VITAMIN E PO [id number] Yes Take 1 capsule by mouth daily. 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   Complete heart block-narrow QRS escape 03/03/2012   MENOPAUSAL SYNDROME 09/17/2009   MELANOMA, 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    Parkinson's disease 11/21/2006   Irritable bowel syndrome 11/21/2006    Immunization History  Administered Date(s) Administered   Influenza Split 03/03/2012   Influenza Whole 03/24/2007, 02/26/2008, 03/13/2010   Influenza, High Dose Seasonal PF 03/09/2013, 02/17/2015, 01/29/2017, 02/08/2018, 02/19/2019, 03/03/2021   Influenza, Seasonal, Injecte, Preservative Fre 03/10/2014   Influenza-Unspecified 02/24/2016   Moderna Sars-Covid-2 Vaccination 06/23/2019, 07/21/2019   Pneumococcal Conjugate-13 07/17/2014   Pneumococcal Polysaccharide-23 05/24/2006   Tdap 06/04/2011   Zoster Recombinat (Shingrix) 04/06/2018, 06/15/2018, 07/21/2018   Zoster, Live 02/26/2008  Patient is checking her blood pressures usually after breakfast between 8-9am. She sometimes skips the midodrine it if its high and she takes it usually twice a day for fear of higher readings. Patient feels weak and lightheaded on the days the BP is lowering and feeling it in the 90s-lows 100s. Patient reports her weigh has gone down slightly and she is hungry and she eats regularly.  -BP? How often taking midodrine?  -medication for frequent urination?  -Recommended checking BP prior to taking midodrine every morning -Reached out to neurologist about BP parameters for taking midodrine -Consider restarting aspirin and statin based on cardiac indication   Conditions to be addressed/monitored:  Hyperlipidemia and Hypotension, Parkinson's disease, Insomnia  Conditions addressed this visit: ***  There are no care plans that you recently modified to display for this patient.   Medication Assistance: None required.  Patient affirms current coverage meets needs.  Compliance/Adherence/Medication fill history: Care Gaps: Tetanus, COVID booster, influenza Last BP - 195/92 on 02/19/2022  Star-Rating Drugs: None  Patient's preferred pharmacy is:  [facility] [place] - [address] [place] [address] Phone: [phone] Fax: [phone]  [id number] [place] [id number]: [phone]: [phone] Uses pill box? Yes Pt endorses 99% compliance - other than midodrine  We discussed: Current pharmacy is preferred with insurance plan and patient is satisfied with pharmacy services Patient decided to: Continue current medication management strategy  Care Plan and Follow Up Patient Decision:  Patient agrees to Care Plan and Follow-up.  Plan: The care management team will reach out to the patient again over the next 7 days.  MJeni Salles PharmD, BHurstPharmacist [place]

## 2022-02-23 NOTE — Telephone Encounter (Signed)
  Chronic Care Management   Outreach Note  02/23/2022 Name: AKITA MAXIM MRN: [id number] DOB: 1940-07-29  Referred by: [name], MD  Patient had a phone appointment scheduled with clinical pharmacist today.  An unsuccessful telephone outreach was attempted today. The patient was referred to the pharmacist for assistance with care management and care coordination.   If possible, a message was left to return call to: [phone] or to [facility]: [place], PharmD, [place] Pharmacist [place] [facility] [place]

## 2022-03-02 ENCOUNTER — Telehealth: Payer: Self-pay | Admitting: Pharmacist

## 2022-03-02 NOTE — Chronic Care Management (AMB) (Signed)
    Chronic Care Management Pharmacy Assistant   Name: Lindsey Bryant  MRN: [id number] DOB: [birth year]  Reason for Encounter: Reschedule appointment with Jeni Salles Clinical Pharmacist.   Rescheduled with patient to 04/12/2022.    [place] Pharmacist Assistant [phone]

## 2022-03-04 ENCOUNTER — Encounter: Payer: Self-pay | Admitting: Internal Medicine

## 2022-03-04 ENCOUNTER — Ambulatory Visit: Payer: Medicare HMO | Admitting: Internal Medicine

## 2022-03-04 VITALS — BP 130/80 | HR 66 | Ht 66.0 in | Wt 106.0 lb

## 2022-03-04 DIAGNOSIS — R1031 Right lower quadrant pain: Secondary | ICD-10-CM

## 2022-03-04 DIAGNOSIS — E739 Lactose intolerance, unspecified: Secondary | ICD-10-CM

## 2022-03-04 DIAGNOSIS — K5900 Constipation, unspecified: Secondary | ICD-10-CM

## 2022-03-04 DIAGNOSIS — R1032 Left lower quadrant pain: Secondary | ICD-10-CM

## 2022-03-04 NOTE — Patient Instructions (Signed)
Glad your doing better.  Due to recent changes in healthcare laws, you may see the results of your imaging and laboratory studies on MyChart before your provider has had a chance to review them.  We understand that in some cases there may be results that are confusing or concerning to you. Not all laboratory results come back in the same time frame and the provider may be waiting for multiple results in order to interpret others.  Please give [place] 48 hours in order for your provider to thoroughly review all the results before contacting the office for clarification of your results.   I appreciate the opportunity to care for you. Silvano Rusk, MD, [facility]

## 2022-03-04 NOTE — Progress Notes (Signed)
Lindsey Bryant 81 y.o. [birth year] [id number]  Assessment & Plan:   Encounter Diagnoses  Name Primary?   Bilateral lower abdominal pain Yes   Constipation, unspecified constipation type    Lactose intolerance suspected     Fortunately she has improved with better regulation of defecation using dietary fiber and MiraLAX and avoidance of dairy products.  So it sounds like there was a component of lactose intolerance as well.  She will follow-up as needed.  Continue current regimen.   Subjective:   Chief Complaint: Follow-up of abdominal pain  HPI 81 year old white woman with a history of Parkinson's disease and IBS-like problems and chronic constipation issues who has had recurrent lower abdominal and pelvic pain also.  It has come and gone over time and she had a flare recently and we communicated by MyChart and phone and I recommended using her MiraLAX more regularly.  She eliminated dairy products and she has less gas and bloating and pain and is feeling much better.  She will use dicyclomine once sometimes twice a day with benefit as needed particularly if she has numerous bowel movements.  She is pleased with her quality of life with respect to her GI tract at this time.  Husband is present and participates in the visit.  Wt Readings from Last 3 Encounters:  03/04/22 106 lb (48.1 kg)  02/19/22 108 lb (49 kg)  12/15/21 111 lb (50.3 kg)   CT abd/pelvis 03/04/20 IMPRESSION: 1. No acute findings are noted in the abdomen or pelvis to account for the patient's symptoms. 2. Aortic atherosclerosis. 3. Additional incidental findings, as above.   CT-E 08/01/16 IMPRESSION: 1. Proximal small bowel has become more distended in the interval measuring 3.8 cm in diameter. Interloop mesenteric fluid persists but does appear to have decreased in the interval. There is more intraperitoneal free fluid at this time. The distal small bowel wall thickening seen on the previous study also  appears improved. 2. On today's exam, 2 discrete small bowel transitions zones are identified in the right peritoneal cavity. Given the presence of 2 transition zones, closed loop obstruction and internal hernia remain distinct considerations. 3. Interval development of small bilateral pleural effusions and bibasilar atelectasis.  Colonoscopy 09/13/2013 ENDOSCOPIC IMPRESSION: 1. Diminutive polyp was found in the rectum; polypectomy was performed with a cold snare 2. The colon was otherwise normal No Known Allergies Current Meds  Medication Sig   carbidopa-levodopa (SINEMET IR) 25-100 MG tablet TAKE 2 TABLETS AT 6/9/NOON/3PM/6PM/9PM   dicyclomine (BENTYL) 10 MG capsule TAKE 1 CAPSULE BY MOUTH EVERY 6 HOURS AS NEEDED   melatonin 5 MG TABS Take 5 mg by mouth at bedtime.   midodrine (PROAMATINE) 5 MG tablet 2 TABLETS AT 7AM, 1 TABLET AT 11AM, 1 TABLET AT 3PM   polyethylene glycol powder (GLYCOLAX/MIRALAX) 17 GM/SCOOP powder Take 17 g by mouth daily.   Probiotic Product (PROBIOTIC PO) Take 1 capsule by mouth daily as needed.   Propylene Glycol (SYSTANE COMPLETE OP) Apply 1 drop to eye daily.   Simethicone (GAS-X PO) Take 1 tablet by mouth as needed. After meals and at bedtime   VITAMIN E PO Take 1 capsule by mouth daily.   Past Medical History:  Diagnosis Date   Acute systolic heart failure (HCC)    Arm fracture    Bradycardia    a. 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   Parkinson's disease    Past Surgical History:  Procedure Laterality Date   APPENDECTOMY  1952   COLONOSCOPY     EYE SURGERY  07/2017   Cataract Sx   LEFT HEART CATH AND CORONARY ANGIOGRAPHY N/A 08/03/2016   Procedure: Left Heart Cath and Coronary Angiography;  Surgeon: Leonie Man, MD;  Location: [facility];  Service: Cardiovascular;  Laterality: N/A;    MELANOMA EXCISION Right 2000   right arm   PACEMAKER PLACEMENT  05/2012   PACEMAKER REVISION N/A 03/14/2012   Procedure: PACEMAKER REVISION;  Surgeon: Thompson Grayer, MD;  Location: [facility] CATH LAB;  Service: Cardiovascular;  Laterality: N/A;   PERMANENT PACEMAKER INSERTION N/A 03/06/2012   Procedure: PERMANENT PACEMAKER INSERTION;  Surgeon: Deboraha Sprang, MD;  Location: [facility] CATH LAB;  Service: Cardiovascular;  Laterality: N/A;   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 2 daughters I think.   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      Right Handed   family history includes Alcohol abuse in her father; Breast cancer in an other family member; Breast cancer (age of onset: 2) in her sister; Diabetes in her brother; Heart attack (age of onset: 49) in her mother; Hypothyroidism in her daughter and daughter.   Review of Systems As above  Objective:   Physical Exam BP 130/80   Pulse 66   Ht '5\' 6"'$  (1.676 m)   Wt 106 lb (48.1 kg)   BMI 17.11 kg/m

## 2022-03-10 ENCOUNTER — Telehealth: Payer: Self-pay | Admitting: Pharmacist

## 2022-03-10 NOTE — Chronic Care Management (AMB) (Unsigned)
    Chronic Care Management Pharmacy Assistant   Name: KATRIA BOTTS  MRN: [id number] DOB: 1940/12/22  03/12/2022 APPOINTMENT REMINDER  Called Eber Hong, No answer, left message of appointment on 03/12/2022 at 11:15 via telephone visit with Jeni Salles, Pharm D. Notified to have all medications, supplements, blood pressure and/or blood sugar logs available during appointment and to return call if need to reschedule.  Care Gaps: AWV - scheduled 11/15/2022 Last BP - 130/80 on 03/04/2022 Covid - overdue Flu - due Tdap - postponed  Star Rating Drug: None  Any gaps in medications fill history? No  Gennie Alma [facility]  Catering manager [phone]

## 2022-03-11 ENCOUNTER — Encounter: Payer: Self-pay | Admitting: Internal Medicine

## 2022-03-11 NOTE — Progress Notes (Signed)
Chronic Care Management Pharmacy Note  03/12/2022 Name:  Lindsey Bryant MRN:  [id number] DOB:  1941/02/28  Summary: BP has improved significantly but pt is not checking regularly  Recommendations/Changes made from today's visit: -Recommended restarting routine BP monitoring -Recommended restarting aspirin therapy and inquired with cardiologist  Plan: Follow up with response from cardiology BP assessment in 1-2 months   Subjective: Lindsey Bryant is an 81 y.o. year old female who is a primary patient of Burchette, Alinda Sierras, MD.  The CCM team was consulted for assistance with disease management and care coordination needs.    Engaged with patient by telephone for follow up visit in response to provider referral for pharmacy case management and/or care coordination services.   Consent to Services:  The patient was given information about Chronic Care Management services, agreed to services, and gave verbal consent prior to initiation of services.  Please see initial visit note for detailed documentation.   Patient Care Team: [name], MD as PCP - General (Family Medicine) Deboraha Sprang, MD as PCP - Cardiology (Cardiology) Deboraha Sprang, MD as PCP - Electrophysiology (Cardiology) Sherlyn Lees, MD as Referring Physician (Neurology) Tat, Eustace Quail, DO as Consulting Physician (Neurology) Viona Gilmore, [facility] as Pharmacist (Pharmacist)  Recent office visits: 11/10/2021 Rolene Arbour, LPN: Patient presented for AWV.  07/15/21 [name], MD: Patient presented for hypotension. Recommended getting back on Midodrine TID.  Recent consult visits: 03/04/22 Silvano Rusk, MD (gastroenterology): Patient presented for abdominal pain. No medication changes. Follow up as needed.  02/19/22 Virl Axe, MD (cardiology): Patient presented for heart block follow up. Plan for CBC and recommended changing to pyridostigmine which might allow a decrease in her systolic blood  pressure target.  12/15/21 Alonza Bogus, DO (neurology): Patient presented for Parkinson's disease follow up. No medication changes. Follow up in 6 months.   Hospital visits: None in previous 6 months   Objective:  Lab Results  Component Value Date   CREATININE 0.73 05/29/2021   BUN 14 05/29/2021   GFR 80.69 04/28/2021   GFRNONAA >60 05/29/2021   GFRAA 90 01/03/2017   NA 134 (L) 05/29/2021   K 4.1 05/29/2021   CALCIUM 9.9 05/29/2021   CO2 29 05/29/2021   GLUCOSE 94 05/29/2021    Lab Results  Component Value Date/Time   HGBA1C 5.3 07/29/2016 12:42 PM   GFR 80.69 04/28/2021 01:53 PM   GFR 78.57 02/27/2020 11:53 AM    Last diabetic Eye exam: No results found for: "HMDIABEYEEXA"  Last diabetic Foot exam: No results found for: "HMDIABFOOTEX"   Lab Results  Component Value Date   CHOL 198 04/28/2021   HDL 83.80 04/28/2021   LDLCALC 105 (H) 04/28/2021   LDLDIRECT 111.7 06/05/2012   TRIG 49.0 04/28/2021   CHOLHDL 2 04/28/2021       Latest Ref Rng & Units 05/29/2021    3:14 PM 04/28/2021    1:53 PM 02/27/2020   11:53 AM  Hepatic Function  Total Protein 6.5 - 8.1 g/dL 6.8  6.6  7.0   Albumin 3.5 - 5.0 g/dL 4.1  4.2  4.4   AST 15 - 41 U/L 11  16  15    ALT 0 - 44 U/L <5  3  2    Alk Phosphatase 38 - 126 U/L 89  62  48   Total Bilirubin 0.3 - 1.2 mg/dL 0.7  0.5  0.5   Bilirubin, Direct 0.0 - 0.3 mg/dL  0.1  Lab Results  Component Value Date/Time   TSH 1.22 04/28/2021 01:53 PM   TSH 1.45 12/26/2019 02:54 PM   FREET4 0.84 07/29/2016 12:08 PM       Latest Ref Rng & Units 02/19/2022    4:47 PM 05/29/2021    3:14 PM 04/28/2021    1:53 PM  CBC  WBC 3.4 - 10.8 x10E3/uL 6.0  8.4  5.9   Hemoglobin 11.1 - 15.9 g/dL 11.8  10.8  12.7   Hematocrit 34.0 - 46.6 % 34.1  31.9  38.3   Platelets 150 - 450 x10E3/uL 173  282  172.0     Lab Results  Component Value Date/Time   VD25OH 80 03/24/2009 08:48 PM    Clinical ASCVD: Yes  The ASCVD Risk score (Arnett DK, et al.,  2019) failed to calculate for the following reasons:   The 2019 ASCVD risk score is only valid for ages 53 to 15   The patient has a prior MI or stroke diagnosis       11/10/2021    1:40 PM 07/15/2021   11:21 AM 10/14/2020    3:22 PM  Depression screen PHQ 2/9  Decreased Interest 0 0 0  Down, Depressed, Hopeless 0 0 0  PHQ - 2 Score 0 0 0      Social History   Tobacco Use  Smoking Status Former  Smokeless Tobacco Never  Tobacco Comments   Smoked cigarettes for 15 yrs    BP Readings from Last 3 Encounters:  03/04/22 130/80  02/19/22 (!) 195/92  12/15/21 112/84   Pulse Readings from Last 3 Encounters:  03/04/22 66  12/15/21 66  07/15/21 81   Wt Readings from Last 3 Encounters:  03/04/22 106 lb (48.1 kg)  02/19/22 108 lb (49 kg)  12/15/21 111 lb (50.3 kg)   BMI Readings from Last 3 Encounters:  03/04/22 17.11 kg/m  02/19/22 19.75 kg/m  12/15/21 20.30 kg/m    Assessment/Interventions: Review of patient past medical history, allergies, medications, health status, including review of consultants reports, laboratory and other test data, was performed as part of comprehensive evaluation and provision of chronic care management services.   SDOH:  (Social Determinants of Health) assessments and interventions performed: Yes  SDOH Interventions    Flowsheet Row Chronic Care Management from 03/12/2022 in [place] at [place] from 11/10/2021 in [place] at [place] from 10/14/2020 in [place] at [place] Management from 12/25/2019 in [place] at [place] Management from 12/11/2019 in [place] at [place] Interventions -- Intervention Not Indicated Intervention Not Indicated -- --  Housing Interventions -- Intervention Not Indicated Intervention Not Indicated -- --  Transportation Interventions Intervention Not Indicated  Intervention Not Indicated -- Intervention Not Indicated Intervention Not Indicated  Financial Strain Interventions -- Intervention Not Indicated -- -- Intervention Not Indicated  Physical Activity Interventions -- Intervention Not Indicated Intervention Not Indicated -- --  Stress Interventions -- Intervention Not Indicated Intervention Not Indicated -- --  Social Connections Interventions -- Intervention Not Indicated Intervention Not Indicated -- --      SDOH Screenings   Food Insecurity: No Food Insecurity (11/10/2021)  Housing: Low Risk  (11/10/2021)  Transportation Needs: No Transportation Needs (03/12/2022)  Alcohol Screen: Low Risk  (11/10/2021)  Depression (PHQ2-9): Low Risk  (11/10/2021)  Financial Resource Strain: Low Risk  (11/10/2021)  Physical Activity: Insufficiently Active (11/10/2021)  Social Connections: Socially  Integrated (11/10/2021)  Stress: No Stress Concern Present (11/10/2021)  Tobacco Use: Medium Risk (03/04/2022)    CCM Care Plan  No Known Allergies  Medications Reviewed Today     Reviewed by Viona Gilmore, [facility] (Pharmacist) on 03/12/22 at 1129  Med List Status: <None>   Medication Order Taking? Sig Documenting Provider Last Dose Status Informant  carbidopa-levodopa (SINEMET IR) 25-100 MG tablet [id number]  TAKE 2 TABLETS AT 6/9/NOON/3PM/6PM/9PM Tat, Eustace Quail, DO  Active   dicyclomine (BENTYL) 10 MG capsule [id number]  TAKE 1 CAPSULE BY MOUTH EVERY 6 HOURS AS NEEDED Gatha Mayer, MD  Active   melatonin 5 MG TABS [id number]  Take 5 mg by mouth at bedtime. [provider]  Active Self  midodrine (PROAMATINE) 5 MG tablet [id number] Yes 2 TABLETS AT 7AM, 1 TABLET AT 11AM, 1 TABLET AT 3PM  Patient taking differently: 1 TABLETS AT 7AM, 2 TABLET AT 11AM, 1 TABLET AT 3PM   Tat, Rebecca S, DO Taking Active   polyethylene glycol powder (GLYCOLAX/MIRALAX) 17 GM/SCOOP powder [id number]  Take 17 g by mouth daily. [provider]  Active   Probiotic  Product (PROBIOTIC PO) [id number]  Take 1 capsule by mouth daily as needed. [provider]  Active   Propylene Glycol (SYSTANE COMPLETE OP) [id number]  Apply 1 drop to eye daily. [provider]  Active   Simethicone (GAS-X PO) [id number]  Take 1 tablet by mouth as needed. After meals and at bedtime [provider]  Active   VITAMIN E PO [id number]  Take 1 capsule by mouth daily. 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   Parkinson's disease 11/21/2006   Irritable bowel syndrome 11/21/2006    Immunization History  Administered Date(s) Administered   Influenza Split 03/03/2012   Influenza Whole 03/24/2007, 02/26/2008, 03/13/2010   Influenza, High Dose Seasonal PF 03/09/2013, 02/17/2015, 01/29/2017, 02/08/2018, 02/19/2019, 03/03/2021, 01/11/2022   Influenza, Seasonal, Injecte, Preservative Fre 03/10/2014   Influenza-Unspecified 02/24/2016   Moderna Sars-Covid-2 Vaccination 06/23/2019, 07/21/2019   Pneumococcal Conjugate-13 07/17/2014   Pneumococcal Polysaccharide-23 05/24/2006   Tdap 06/04/2011   Zoster Recombinat (Shingrix) 04/06/2018, 06/15/2018, 07/21/2018   Zoster, Live 02/26/2008   Patient reports her blood pressure is doing a lot better lately. She only had one recent higher reading and hasn't had any lows. She is now taking 4 of the midodrine per day as prescribed.  Patient has felt lightheaded but it only  lasted a couple of seconds. Patient hasn't had any recent falls.  Patient reported that Dr. Caryl Comes recommended stopping the aspirin but neither her nor her husband were sure of the reasoning. They sounded unsure about the recommendation in general.   Patient's husband reported she might be starting on pyridostigmine soon to help with the highs and lows she is experiencing. They were told Dr. Caryl Comes would reach out to Dr. Carles Collet to inquire about it but never heard back. They messaged Dr. Caryl Comes yesterday about it.  Patient also recently connected with the online voice project to help strengthen her voice. She is not very consistent with it but does notice a difference on the days she is doing it.  Conditions to be addressed/monitored:  Hyperlipidemia and Hypotension, Parkinson's disease, Insomnia  Conditions addressed this visit: Hypotension, CAD  Care Plan : [place]  Updates made by Viona Gilmore, [place] since 03/12/2022 12:00 AM     Problem: Problem: Hyperlipidemia and History of NSTEMI, Parkinson's disease, Insomnia      Long-Range Goal: Patient-Specific Goal   Start Date: 08/17/2021  Expected End Date: 08/18/2022  Recent Progress: On track  Priority: High  Note:   Current Barriers:  Unable to independently monitor therapeutic efficacy Unable to achieve control of blood pressure   Pharmacist Clinical Goal(s):  Patient will achieve adherence to monitoring guidelines and medication adherence to achieve therapeutic efficacy achieve control of blood pressure as evidenced by home and office readings  through collaboration with PharmD and provider.   Interventions: 1:1 collaboration with [name], MD regarding development and update of comprehensive plan of care as evidenced by provider attestation and co-signature Inter-disciplinary care team collaboration (see longitudinal plan of care) Comprehensive medication review performed; medication list updated in  electronic medical record  Hypotension (BP goal < 180/90 and > 100/60) -Controlled -Current treatment: Midodrine 5 mg 1 tablet at 7 am, 2 tablets at 11am and 1 tablet at 3 pm - Appropriate, Effective, Safe, Accessible -Medications previously tried: none  -Current home readings: 138/64, 193/95, 110/61, 133/75, 138/80  -Current dietary habits: was eating salt heavy foods but has backed down some -Current exercise habits: minimal -Reports hypotensive/hypertensive symptoms -Educated on Importance of home blood pressure monitoring; Proper BP monitoring technique; Symptoms of hypotension and importance of maintaining adequate hydration; -Counseled to monitor BP at home daily, document, and provide log at future appointments -Recommended to continue current medication Recommended restarting routine BP monitoring.  Hyperlipidemia/questionable NSTEMI/CAD: (LDL goal < 70) -Uncontrolled -Current treatment: No medications -Medications previously tried: atorvastatin (stomach upset)  -Current dietary patterns: did not discuss -Current exercise habits: minimal -Educated on Benefits of statin for ASCVD risk reduction; -Collaborated with cardiology to restart aspirin.  Insomnia (Goal: improve quality and quantity of sleep) -Controlled -Current treatment  Melatonin 5 mg 1 tablet at bedtime - Appropriate, Effective, Safe, Accessible -Medications previously tried: mirtazapine (no longer needed)  -Recommended avoiding higher doses of melatonin.   Parkinson's disease (Goal: minimize symptoms) -Controlled -Current treatment  Carbidopa-levodopa IR 25-100 mg 2 tablets at 6/9/noon/3/6/9pm - Appropriate, Effective, Safe, Accessible -Medications previously tried: none  -Recommended to continue current medication  IBS (Goal: minimize symptoms) -Controlled -Current treatment  Miralax daily - Appropriate, Effective, Safe, Accessible Probiotic daily - not taking regularly Simethicone as needed -  Appropriate, Effective, Safe, Accessible Dicyclomine 10 mg 1 tablet every 6 hours as needed - Appropriate, Effective, Safe, Accessible -Medications previously tried: none  -Recommended to continue current medication   Health Maintenance -Vaccine gaps: tetanus, COVID booster -Current therapy:  Miralax as needed Probiotic daily Simethicone as needed Vitamin E daily Ibuprofen 200 mg as needed -Educated on Cost vs benefit of each product must be carefully weighed by individual consumer -Patient is satisfied with current therapy and denies issues -Recommended to continue current medication  Patient Goals/Self-Care Activities Patient will:  - check blood pressure daily, document, and provide at future appointments  Follow Up Plan: The care management team will reach out to the patient again over the next 14  days.       Medication Assistance: None required.  Patient affirms current coverage meets needs.  Compliance/Adherence/Medication fill history: Care Gaps: Tetanus, COVID booster Last BP - 130/80 on 03/04/2022  Star-Rating Drugs: None  Patient's preferred pharmacy is:  [facility] [place] - [address] [place] [address] Phone: [phone] Fax: [phone]  [id number] [place] [id number]: [phone]: [phone] Uses pill box? Yes Pt endorses 99% compliance - other than midodrine  We discussed: Current pharmacy is preferred with insurance plan and patient is satisfied with pharmacy services Patient decided to: Continue current medication management strategy  Care Plan and Follow Up Patient Decision:  Patient agrees to Care Plan and Follow-up.  Plan: The care management team will reach out to the patient again over the next 14 days.  MJeni Salles PharmD, [place]

## 2022-03-12 ENCOUNTER — Ambulatory Visit (INDEPENDENT_AMBULATORY_CARE_PROVIDER_SITE_OTHER): Payer: Medicare HMO | Admitting: Pharmacist

## 2022-03-12 DIAGNOSIS — I959 Hypotension, unspecified: Secondary | ICD-10-CM

## 2022-03-12 DIAGNOSIS — I214 Non-ST elevation (NSTEMI) myocardial infarction: Secondary | ICD-10-CM

## 2022-03-12 NOTE — Patient Instructions (Signed)
Hi [place] and [name],  It was great to speak with you guys again! Don't forget to let us know when you get your next COVID booster and tetanus shot so we can update our records.  Please reach out to me if you have any questions or need anything before I reach back out!  Best, Maddie  Jeni Salles, PharmD, [place] Pharmacist [place] at [place]   Visit Information   Goals Addressed   None    Patient Care Plan: CCM Pharmacy Care Plan     Problem Identified: Problem: Hyperlipidemia and History of NSTEMI, Parkinson's disease, Insomnia      Long-Range Goal: Patient-Specific Goal   Start Date: 08/17/2021  Expected End Date: 08/18/2022  Recent Progress: On track  Priority: High  Note:   Current Barriers:  Unable to independently monitor therapeutic efficacy Unable to achieve control of blood pressure   Pharmacist Clinical Goal(s):  Patient will achieve adherence to monitoring guidelines and medication adherence to achieve therapeutic efficacy achieve control of blood pressure as evidenced by home and office readings  through collaboration with PharmD and provider.   Interventions: 1:1 collaboration with [name], MD regarding development and update of comprehensive plan of care as evidenced by provider attestation and co-signature Inter-disciplinary care team collaboration (see longitudinal plan of care) Comprehensive medication review performed; medication list updated in electronic medical record  Hypotension (BP goal < 180/90 and > 100/60) -Controlled -Current treatment: Midodrine 5 mg 1 tablet at 7 am, 2 tablets at 11am and 1 tablet at 3 pm - Appropriate, Effective, Safe, Accessible -Medications previously tried: none  -Current home readings: 138/64, 193/95, 110/61, 133/75, 138/80  -Current dietary habits: was eating salt heavy foods but has backed down some -Current exercise habits: minimal -Reports hypotensive/hypertensive  symptoms -Educated on Importance of home blood pressure monitoring; Proper BP monitoring technique; Symptoms of hypotension and importance of maintaining adequate hydration; -Counseled to monitor BP at home daily, document, and provide log at future appointments -Recommended to continue current medication Recommended restarting routine BP monitoring.  Hyperlipidemia/questionable NSTEMI/CAD: (LDL goal < 70) -Uncontrolled -Current treatment: No medications -Medications previously tried: atorvastatin (stomach upset)  -Current dietary patterns: did not discuss -Current exercise habits: minimal -Educated on Benefits of statin for ASCVD risk reduction; -Collaborated with cardiology to restart aspirin.  Insomnia (Goal: improve quality and quantity of sleep) -Controlled -Current treatment  Melatonin 5 mg 1 tablet at bedtime - Appropriate, Effective, Safe, Accessible -Medications previously tried: mirtazapine (no longer needed)  -Recommended avoiding higher doses of melatonin.   Parkinson's disease (Goal: minimize symptoms) -Controlled -Current treatment  Carbidopa-levodopa IR 25-100 mg 2 tablets at 6/9/noon/3/6/9pm - Appropriate, Effective, Safe, Accessible -Medications previously tried: none  -Recommended to continue current medication  IBS (Goal: minimize symptoms) -Controlled -Current treatment  Miralax daily - Appropriate, Effective, Safe, Accessible Probiotic daily - not taking regularly Simethicone as needed - Appropriate, Effective, Safe, Accessible Dicyclomine 10 mg 1 tablet every 6 hours as needed - Appropriate, Effective, Safe, Accessible -Medications previously tried: none  -Recommended to continue current medication   Health Maintenance -Vaccine gaps: tetanus, COVID booster -Current therapy:  Miralax as needed Probiotic daily Simethicone as needed Vitamin E daily Ibuprofen 200 mg as needed -Educated on Cost vs benefit of each product must be carefully weighed by  individual consumer -Patient is satisfied with current therapy and denies issues -Recommended to continue current medication  Patient Goals/Self-Care Activities Patient will:  - check blood pressure daily, document, and provide at future  appointments  Follow Up Plan: The care management team will reach out to the patient again over the next 14 days.        Patient verbalizes understanding of instructions and care plan provided today and agrees to view in [place]. Active MyChart status and patient understanding of how to access instructions and care plan via MyChart confirmed with patient.    The pharmacy team will reach out to the patient again over the next 14 days.   Viona Gilmore, [facility]

## 2022-03-22 ENCOUNTER — Other Ambulatory Visit: Payer: Self-pay | Admitting: Neurology

## 2022-03-22 DIAGNOSIS — G20A1 Parkinson's disease without dyskinesia, without mention of fluctuations: Secondary | ICD-10-CM

## 2022-03-23 ENCOUNTER — Telehealth: Payer: Self-pay | Admitting: Pharmacist

## 2022-03-23 DIAGNOSIS — E785 Hyperlipidemia, unspecified: Secondary | ICD-10-CM | POA: Diagnosis not present

## 2022-03-23 DIAGNOSIS — G20C Parkinsonism, unspecified: Secondary | ICD-10-CM

## 2022-03-23 NOTE — Telephone Encounter (Signed)
Called patient back after discussion with cardiologist about restarting aspirin 81 mg daily. Patient and husband verbalized their understanding and will start back with her medications tomorrow. Added aspirin back to her medication list. Patient's husband inquired about pyridostigmine as they never heard back from cardiology about starting this. Will send message to cardiologist to inquire.

## 2022-03-25 MED ORDER — PYRIDOSTIGMINE BROMIDE 60 MG PO TABS: 30.0000 mg | ORAL_TABLET | Freq: Two times a day (BID) | ORAL | 3 refills | Status: DC

## 2022-04-28 ENCOUNTER — Ambulatory Visit (INDEPENDENT_AMBULATORY_CARE_PROVIDER_SITE_OTHER): Payer: Medicare HMO

## 2022-04-28 DIAGNOSIS — I442 Atrioventricular block, complete: Secondary | ICD-10-CM | POA: Diagnosis not present

## 2022-04-30 ENCOUNTER — Other Ambulatory Visit: Payer: Self-pay | Admitting: Neurology

## 2022-04-30 ENCOUNTER — Encounter: Payer: Self-pay | Admitting: Family Medicine

## 2022-04-30 ENCOUNTER — Ambulatory Visit (INDEPENDENT_AMBULATORY_CARE_PROVIDER_SITE_OTHER): Payer: Medicare HMO | Admitting: Family Medicine

## 2022-04-30 VITALS — BP 160/82 | HR 59 | Temp 97.5°F | Ht 62.21 in | Wt 109.3 lb

## 2022-04-30 DIAGNOSIS — G20A1 Parkinson's disease without dyskinesia, without mention of fluctuations: Secondary | ICD-10-CM

## 2022-04-30 DIAGNOSIS — Z0000 Encounter for general adult medical examination without abnormal findings: Secondary | ICD-10-CM | POA: Diagnosis not present

## 2022-04-30 LAB — CUP PACEART REMOTE DEVICE CHECK
Battery Impedance: 2051 Ohm
Battery Remaining Longevity: 34 mo
Battery Voltage: 2.75 V
Brady Statistic AP VP Percent: 13 %
Brady Statistic AP VS Percent: 0 %
Brady Statistic AS VP Percent: 86 %
Brady Statistic AS VS Percent: 0 %
Date Time Interrogation Session: 20231208172017
Implantable Lead Connection Status: 753985
Implantable Lead Connection Status: 753985
Implantable Lead Implant Date: 20131014
Implantable Lead Implant Date: 20131014
Implantable Lead Location: 753859
Implantable Lead Location: 753860
Implantable Lead Model: 5076
Implantable Lead Model: 5076
Implantable Pulse Generator Implant Date: 20131014
Lead Channel Impedance Value: 407 Ohm
Lead Channel Impedance Value: 717 Ohm
Lead Channel Pacing Threshold Amplitude: 0.375 V
Lead Channel Pacing Threshold Amplitude: 0.875 V
Lead Channel Pacing Threshold Pulse Width: 0.4 ms
Lead Channel Pacing Threshold Pulse Width: 0.4 ms
Lead Channel Setting Pacing Amplitude: 2 V
Lead Channel Setting Pacing Amplitude: 2.5 V
Lead Channel Setting Pacing Pulse Width: 0.4 ms
Lead Channel Setting Sensing Sensitivity: 4 mV
Zone Setting Status: 755011
Zone Setting Status: 755011

## 2022-04-30 NOTE — Progress Notes (Signed)
Established Patient Office Visit  Subjective   Patient ID: Lindsey Bryant, female    DOB: 01/09/1941  Age: 81 y.o. MRN: [id number]  Chief Complaint  Patient presents with   Annual Exam    HPI   Lindsey Bryant was scheduled today for a "physical exam ".  Her chronic medical problems include history of Parkinson's disease, IBS, history of NSTEMI, history of complete heart block, history of melanoma.  She is followed by neurology as well as cardiology and GI.  She continues to complain mostly of GI symptoms.  Increased gas symptoms.  She had some chronic intermittent constipation which is improving some with MiraLAX and fiber supplementation.  Her weight has fluctuated considerably in the past.  Currently stable.  She has had some blood pressure instability problems and takes midodrine.  Has significant fluctuations in blood pressure.  Recently prescribed Mestinon per cardiology but they were reluctant to start because of history of bowel obstruction.  They never started on that medication.  Health maintenance reviewed  -Flu vaccine already given -Pneumonia vaccine complete -Shingrix complete -She does not any longer get mammograms per her choice -Aged out of colonoscopy screening  Social history-she is married.  Has 2 daughters.  No grandchildren.  Non-smoker.  No regular alcohol.  Family history-mother reportedly had MI age 40.  She had a sister with breast cancer.  Brother with type 2 diabetes.  Her father had history of alcoholism.  Past Medical History:  Diagnosis Date   Acute systolic heart failure (HCC)    Arm fracture    Bradycardia    a. 02/2012   Hyperglycemia    IBS (irritable bowel syndrome)    Melanoma ([place])    right arm   NSTEMI (non-ST elevated myocardial infarction) ([place])    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/2012   Parkinson's disease    Past Surgical History:  Procedure Laterality Date   APPENDECTOMY  1952   COLONOSCOPY     EYE SURGERY  07/2017   Cataract Sx   LEFT  HEART CATH AND CORONARY ANGIOGRAPHY N/A 08/03/2016   Procedure: Left Heart Cath and Coronary Angiography;  Surgeon: Leonie Man, MD;  Location: [facility] LAB;  Service: Cardiovascular;  Laterality: N/A;   MELANOMA EXCISION Right 2000   right arm   PACEMAKER PLACEMENT  05/2012   PACEMAKER REVISION N/A 03/14/2012   Procedure: PACEMAKER REVISION;  Surgeon: Thompson Grayer, MD;  Location: [facility] CATH LAB;  Service: Cardiovascular;  Laterality: N/A;   PERMANENT PACEMAKER INSERTION N/A 03/06/2012   Procedure: PERMANENT PACEMAKER INSERTION;  Surgeon: Deboraha Sprang, MD;  [facility] C[facility] LAB;  Service: Cardiovascular;  Laterality: N/A;   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 She has never used smokeless tobacco. She reports that she does not drink alcohol and does not use drugs. family history includes Alcohol abuse in her father; Breast cancer in an other family member; Breast cancer (age of onset: 31) in her sister; Diabetes in her brother; Heart attack (age of onset: 65) in her mother; Hypothyroidism in her daughter and daughter. No Known Allergies   Review of Systems  Constitutional:  Negative for chills, fever and malaise/fatigue.  Eyes:  Negative for blurred vision.  Respiratory:  Negative for shortness of breath.   Cardiovascular:  Negative for chest pain.  Genitourinary:  Negative for dysuria.  Neurological:  Negative for dizziness, weakness and headaches.      Objective:  BP (!) 160/82   Pulse (!) 59   Temp (!) 97.5 F (36.4 C) (Oral)   Ht 5' 2.21" (1.58 m)   Wt 109 lb 4.8 oz (49.6 kg)   SpO2 96%   BMI 19.86 kg/m  BP Readings from Last 3 Encounters:  04/30/22 (!) 160/82  03/04/22 130/80  02/19/22 (!) 195/92   Wt Readings from Last 3 Encounters:  04/30/22 109 lb 4.8 oz (49.6 kg)  03/04/22 106 lb (48.1 kg)  02/19/22 108 lb (49 kg)      Physical Exam Vitals reviewed.  HENT:     Head: Normocephalic and  atraumatic.     Right Ear: Tympanic membrane normal.     Left Ear: Tympanic membrane normal.     Mouth/Throat:     Mouth: Mucous membranes are moist.     Pharynx: Oropharynx is clear.  Cardiovascular:     Rate and Rhythm: Normal rate.  Pulmonary:     Effort: Pulmonary effort is normal.     Breath sounds: Normal breath sounds. No wheezing or rales.  Musculoskeletal:     Right lower leg: No edema.     Left lower leg: No edema.  Skin:    Findings: No rash.  Neurological:     General: No focal deficit present.     Mental Status: She is alert.     Cranial Nerves: No cranial nerve deficit.      No results found for any visits on 04/30/22.    The ASCVD Risk score (Arnett DK, et al., 2019) failed to calculate for the following reasons:   The 2019 ASCVD risk score is only valid for ages 21 to 73   The patient has a prior MI or stroke diagnosis    Assessment & Plan:   Physical exam.  Lindsey Bryant has chronic issues as above.  She has Parkinson's disease with history of autonomic instability and orthostasis.  Blood pressure initially was up today 160/82 but repeat left arm seated after rest 142/70 and standing 120/62.  We discussed the following health maintenance issues  -Flu vaccine already given -Aged out of further screening colonoscopies -We discussed mammogram screening but she declines at this time which seems reasonable -Not due for any follow-up labs at this time. -We did discuss RSV vaccine with handout given.  She will consider at some point this year -Pneumonia vaccines already complete -Discussed fall prevention.  She has substantial risk with her blood pressure instability and Parkinson's disease.  Lindsey Littler, MD

## 2022-05-03 DIAGNOSIS — H04123 Dry eye syndrome of bilateral lacrimal glands: Secondary | ICD-10-CM | POA: Diagnosis not present

## 2022-05-03 DIAGNOSIS — H524 Presbyopia: Secondary | ICD-10-CM | POA: Diagnosis not present

## 2022-05-21 NOTE — Progress Notes (Signed)
Remote pacemaker transmission.   

## 2022-06-14 DIAGNOSIS — D229 Melanocytic nevi, unspecified: Secondary | ICD-10-CM | POA: Diagnosis not present

## 2022-06-14 DIAGNOSIS — L570 Actinic keratosis: Secondary | ICD-10-CM | POA: Diagnosis not present

## 2022-06-14 DIAGNOSIS — D1801 Hemangioma of skin and subcutaneous tissue: Secondary | ICD-10-CM | POA: Diagnosis not present

## 2022-06-14 DIAGNOSIS — Z85820 Personal history of malignant melanoma of skin: Secondary | ICD-10-CM | POA: Diagnosis not present

## 2022-06-14 DIAGNOSIS — L578 Other skin changes due to chronic exposure to nonionizing radiation: Secondary | ICD-10-CM | POA: Diagnosis not present

## 2022-06-14 DIAGNOSIS — L814 Other melanin hyperpigmentation: Secondary | ICD-10-CM | POA: Diagnosis not present

## 2022-06-14 DIAGNOSIS — L821 Other seborrheic keratosis: Secondary | ICD-10-CM | POA: Diagnosis not present

## 2022-06-21 NOTE — Progress Notes (Unsigned)
Assessment/Plan:   1.  Parkinsons Disease, sx's since 2006  -continue carbidopa/levodopa 25/100, 2 po 6 times per day (6/9/noon/3pm/6pm/9pm)  -discussed getting back to exercise  -declined PT.  Discussed aqua therapy as well  -increase exercise  -follows with dermatology one time per year.  She is seeing dermatology specialists of [place].      2.  Neurogenic Orthostatic Hypotension  -Continue midodrine, 5 mg, 2 in the AM, 1 in the afternoon and evening.    -She was prescribed Mestinon by cardiology but never took it because she was worried about bowel obstruction.  We discussed this today.  It is certainly cholinergic and can cause GI upset but I would think the bentyl she is using would be more worrisome for bowel obstruction given anticholinergic nature but she is pleased with her BP control and not getting extreme highs.  I did tell her to make an appt with Dr. Caryl Comes to discuss further.  -monitor BP at home  3.  Abdominal pain with hx of IBS (constipation and diarrhea)  -follows with Dr. [name]  -discussed anticholinergic s/e with bentyl.  She is only using occasionally.  4.  Few episodes MS change  -suspect due to Neurogenic Orthostatic Hypotension  5.  Fall with pathologic fracture of the right humerus  -Patient was following to make sure pathologic fracture was not caused by metastatic disease/hematologic disease, but ultimately refused further work-up by oncology.  6.  Insomnia  -She asked about taking melatonin at 3 AM when she wakes up.  I told her that I would not recommend this as melatonin resets sleep cycles.  -Discussed mirtazapine.  We gave this to her in the past at very low-dose, but she ended up stopping it.  She does not want to take any more medication right now.   Subjective:   Lindsey Bryant was seen today in follow up for Parkinsons disease.  My previous records were reviewed prior to todays visit as well as outside records available to me.  Patient  with spouse who supplements the history.  She has seen cardiology since last visit.  Dr. Caryl Comes sent me a message and asked me if he could use Prostigmine for her Neurogenic Orthostatic Hypotension with him I certainly have no objection to that.  He started 30 mg twice per day in October; however, patient was worried about GI side effects and never started it.  She is on bentyl, however.  She has had some low BP's of 80's and highs in the 130's.  They have not noted any significant high BP.  Asks if she can take melatonin at 3am when she wakes up.  She is doing LSVT on youtube.    Current prescribed movement disorder medications:  Carbidopa/levodopa 25/100, 2 tablets 6 times per day (6:30am, 9:30am, 12:30pm, 3:30, 6:30, 10:30pm)  Midodrine, 5 mg,2 in the morning, 1 in the afternoon and 1 in the evening Pyridostigmine, 60 mg, half tablet twice per day (prescribed by cardiology but never taken)  Prior medications: Mirtazapine, 7.5 mg at bedtime (I started her on that for sleep and to help weight gain, but her sleep issues were really from nocturia and she stopped it); Myrbetriq; Mestinon given for blood pressure by cardiology but never took it  ALLERGIES:   Allergies  Allergen Reactions   Oxycodone Other (See Comments)    CURRENT MEDICATIONS:  Outpatient Encounter Medications as of 06/22/2022  Medication Sig   aspirin EC 81 MG tablet Take 81 mg by  mouth daily. Swallow whole.   carbidopa-levodopa (SINEMET IR) 25-100 MG tablet TAKE 2 TABLETS AT 6AM/9AM/NOON/3PM/6PM/9PM   dicyclomine (BENTYL) 10 MG capsule TAKE 1 CAPSULE BY MOUTH EVERY 6 HOURS AS NEEDED   melatonin 5 MG TABS Take 5 mg by mouth at bedtime.   midodrine (PROAMATINE) 5 MG tablet 2 TABLETS AT 7AM, 1 TABLET AT 11AM, 1 TABLET AT 3PM (Patient taking differently: 1 TABLETS AT 7AM, 2 TABLET AT 11AM, 1 TABLET AT 3PM)   polyethylene glycol powder (GLYCOLAX/MIRALAX) 17 GM/SCOOP powder Take 17 g by mouth daily.   Probiotic Product (PROBIOTIC PO)  Take 1 capsule by mouth daily as needed.   Propylene Glycol (SYSTANE COMPLETE OP) Apply 1 drop to eye daily.   Simethicone (GAS-X PO) Take 1 tablet by mouth as needed. After meals and at bedtime   VITAMIN E PO Take 1 capsule by mouth daily.   pyridostigmine (MESTINON) 60 MG tablet Take 0.5 tablets (30 mg total) by mouth 2 (two) times daily. (Patient not taking: Reported on 06/22/2022)   No facility-administered encounter medications on file as of 06/22/2022.    Objective:   PHYSICAL EXAMINATION:    VITALS:   Vitals:   06/22/22 1506  BP: 118/72  Pulse: 71  SpO2: 98%  Weight: 107 lb 6.4 oz (48.7 kg)  Height: '5\' 2"'$  (1.575 m)    No data found.    GEN:  The patient appears stated age and is in NAD. HEENT:  Normocephalic, atraumatic.  The mucous membranes are moist. The superficial temporal arteries are without ropiness or tenderness.   Neurological examination:  Orientation: The patient is alert and oriented x3. Cranial nerves: There is good facial symmetry with mild facial hypomimia. The speech is fluent and clear. Soft palate rises symmetrically and there is no tongue deviation. Hearing is intact to conversational tone. Sensation: Sensation is intact to light touch throughout Motor: Strength is at least antigravity x4.    Movement examination: Tone: There is nl tone in the ue/le Abnormal movements: there is mild LLE dyskinesia Coordination:  There is mild decremation on the L Gait and Station: The patient arises well.  No shuffling.  Walks well today  I have reviewed and interpreted the following labs independently    Chemistry      Component Value Date/Time   NA 134 (L) 05/29/2021 1514   NA 141 01/03/2017 1515   K 4.1 05/29/2021 1514   CL 99 05/29/2021 1514   CO2 29 05/29/2021 1514   BUN 14 05/29/2021 1514   BUN 13 01/03/2017 1515   CREATININE 0.73 05/29/2021 1514   CREATININE 0.78 12/26/2019 1454      Component Value Date/Time   CALCIUM 9.9 05/29/2021 1514    ALKPHOS 89 05/29/2021 1514   AST 11 (L) 05/29/2021 1514   ALT <5 05/29/2021 1514   BILITOT 0.7 05/29/2021 1514       Lab Results  Component Value Date   WBC 6.0 02/19/2022   HGB 11.8 02/19/2022   HCT 34.1 02/19/2022   MCV 98 (H) 02/19/2022   PLT 173 02/19/2022    Lab Results  Component Value Date   TSH 1.22 04/28/2021     Total time spent on today's visit was 30 minutes, including both face-to-face time and nonface-to-face time.  Time included that spent on review of records (prior notes available to me/labs/imaging if pertinent), discussing treatment and goals, answering patient's questions and coordinating care.  Cc:  [name], MD

## 2022-06-22 ENCOUNTER — Encounter: Payer: Self-pay | Admitting: Neurology

## 2022-06-22 ENCOUNTER — Ambulatory Visit (INDEPENDENT_AMBULATORY_CARE_PROVIDER_SITE_OTHER): Payer: Medicare HMO | Admitting: Neurology

## 2022-06-22 VITALS — BP 118/72 | HR 71 | Ht 62.0 in | Wt 107.4 lb

## 2022-06-22 DIAGNOSIS — G20B1 Parkinson's disease with dyskinesia, without mention of fluctuations: Secondary | ICD-10-CM

## 2022-06-22 DIAGNOSIS — G20A2 Parkinson's disease without dyskinesia, with fluctuations: Secondary | ICD-10-CM | POA: Diagnosis not present

## 2022-06-22 DIAGNOSIS — G903 Multi-system degeneration of the autonomic nervous system: Secondary | ICD-10-CM

## 2022-06-22 DIAGNOSIS — G4701 Insomnia due to medical condition: Secondary | ICD-10-CM | POA: Diagnosis not present

## 2022-06-22 NOTE — Patient Instructions (Signed)
Local and Online Resources for Power over Parkinson's Group  January 2024   LOCAL [place] PARKINSON'S GROUPS   Power over Parkinson's Group:    Power Over Parkinson's Patient Education Group will be Wednesday, January 10th-*Hybrid meting*- in person at [facility] location and via [facility], 2:00-3:00 pm.   Starting in November, Power over [employer] and Care Partner Groups will meet together, with plans for separate break out session for caregivers (*this will be evolving over the next few months) Upcoming Power over Parkinson's Meetings/Care Partner Support:  2nd Wednesdays of the month at 2 pm:   January 10th, February 14th [place] at [email or web address] if interested in participating in this group    [place]! Moves Dynegy Instructor-Led Classes offering at [employer]!  TUESDAYS and Wednesdays 1-2 pm.   Contact Vonna Kotyk at  [email or web address]  or [phone] (Tuesday classes are modified for chair and standing only) Drumming for Parkinson's will be held on 2nd and 4th Mondays at 11:00 am.   Located at the [place] ([place]  Contact Doylene Canning at [email or web address] or [place] Class, Mondays at 11 am.  Call [phone] for details Let's Try Pickleball-$25 for 6 weeks of Pickleball in [place].  Contact Corwin Levins for more details and for dates.  [email or web address] SAVE THE DATE and REGISTER:  [place] Chapter of Parkinson's Foundation:  Parkinson's Symposium.  Conversations about Parkinson's.  Saturday, July 24, 2022, 9:00 am-2:00 pm.  [place], *In person or online via [place]*.  Register at [email or web address] or call Beverlee Nims at [phone].   [place]:  [email or web address]  PD Health at Home continues:   Mindfulness Mondays, Wellness Wednesdays, Fitness Fridays   Upcoming Education:    Environmental health practitioner your Voice:  Air cabin crew. Wednesday, January 3rd,  1-2 pm  Managing Weight Loss & Retaining Muscle Mass.  Wednesday, Jan 10th, 1-2 pm Changes in Speech and Voice.  Wednesday, January 17th, 1-2 pm Register for virtual education and Patent attorney (webinars) at [email or web address] Please check out their website to sign up for emails and see their full online offerings      [place]:  [email or web address]   Third Thursday Webinars:  On the third Thursday of every month at 12 p.m. ET, join our free live webinars to learn about various aspects of living with Parkinson's disease and our work to speed medical breakthroughs.  Upcoming Webinar:  New Year, New Moves!  Explore Exercise for Life with Parkinson's.  Thursday, January 18th at 12 noon. Check out additional information on their website to see their full online offerings    [facility]  [email or web address]  Upcoming Webinar:   Physical Therapy and Parkinson's.  Thursday, January 11th, 2 pm  Webinar Series:  Living with Parkinson's Meetup.   Third Thursdays each month, 3 pm  Care Partner Monthly Meetup.  With Robin Searing Phinney.  First Tuesday of each month, 2 pm  Check out additional information to Live Well Today on their website    Parkinson and Movement Disorders (PMD) Alliance:  www.[email or web address]  NeuroLife Online:  Online Education Events  Sign up for emails, which are sent weekly to give you updates on programming and online offerings    Parkinson's Association of the [place]:  www.[email or web address]  Information on online support groups, education events, and online exercises including Yoga, Parkinson's exercises  and more-LOTS of information on links to PD resources and online events  Virtual Support Group through  Parkinson's Association of the [place]; next one is scheduled for Wednesday, Feb 7th  MOVEMENT AND EXERCISE OPPORTUNITIES  PWR! Moves Classes at [place].  Wednesdays 10 and 11 am.   Contact Amy Marriott, PT [email or web address] if interested.  NEW PWR! Moves Class offerings at [employer].  *TUESDAYS* and Wednesdays 1-2 pm.    Contact Vonna Kotyk at  [email or web address]    Parkinson's Wellness Recovery (PWR! Moves)  www.[email or web address]  Info on the PWR! Virtual Experience:  You will have access to our expertise?through self-assessment, guided plans that start with the PD-specific fundamentals, educational content, tips, Q&A with an expert, and a growing Art therapist of PD-specific pre-recorded and live exercise classes of varying types and intensity - both physical and cognitive! If that is not enough, we offer 1:1 wellness consultations (in-person or virtual) to personalize your PWR! Research scientist (medical).   [place] Fridays:   As part of the PD Health @ Home program, this free video series focuses each week on one aspect of fitness designed to support people living with Parkinson's.? These weekly videos highlight the [place] fitness guidelines for people with Parkinson's disease.  ModemGamers.[email or web address]   Dance for PD website is offering free, live-stream classes throughout the week, as well as links to [employer] of classes:  [email or web address]  Virtual dance and Pilates for Parkinson's classes: Click on the Community Tab> Parkinson's Movement Initiative Tab.  To register for classes and for more information, visit [email or web address] and click the "community" tab.   [employer] Parkinson's Cycling Classes   [employer]:  Thursdays @ Noon-Live classes at [employer] ([employer] at [email or web address]?or [phone])  [employer]: Virtual  Classes Mondays and Thursdays Jeanette Caprice classes Tuesday, Wednesday and Thursday (contact [place] at [email or web address] ?or [phone])  [place]  Varied levels of classes are offered Tuesdays and Thursdays at [employer].   Stretching with Verdis Frederickson weekly class is also offered for people with Parkinson's  To observe a class or for more information, call [phone] or email Hezzie Bump at [email or web address]   ADDITIONAL SUPPORT AND RESOURCES  Well[employer]:Online Caregiver Education Opportunities:  www.well-springsolutions.org/caregiver-education/caregiver-support-group.  You may also contact Vickki Muff at [email or web address] or [phone].     Well-Spring Navigator:  Just1Navigator program, a?free service to help individuals and families through the journey of determining care for older adults.  The "Navigator" is a [phone], Education officer, museum, who will speak with a prospective client and/or loved ones to provide an assessment of the situation and a set of recommendations for a personalized care plan -- all free of charge, and whether?Well[employer]Spring [employer] offers the needed service or not. If the need is not a service we provide, we are well-connected with reputable programs in town that we can refer you to.  www.well-springsolutions.org or to speak with the Navigator, call [phone].

## 2022-06-25 ENCOUNTER — Telehealth: Payer: Self-pay

## 2022-06-25 NOTE — Progress Notes (Signed)
Care Management & Coordination Services Pharmacy Team  Reason for Encounter: Hypotension  Contacted patient to discuss hypotension disease state. Spoke with patient on 06/25/2022    Current hypotension regimen:  Sinemet IR 25/100 mg take 2 tablets at 6AM, 9AM, noon, 3PM, 6PM and 9PM   Patient verbally confirms she is taking the above medications as directed. Yes, she will miss an occasional dose and she will notice and take it right away.   How often are you checking your Blood Pressure? Patient checks her blood pressure daily  Current home BP readings: Patient states her readings have been between 80/60 and 133/76, on average her readings are 105/70.   Wrist or arm cuff: Patient has an arm cuff  What recent interventions/DTPs have been made by any provider to improve Blood Pressure control since last CPP Visit: No recent interventions noted  Any recent hospitalizations or ED visits since last visit with CPP? No recent hospital visits.   What diet changes have been made to improve Blood Pressure Control?  No dietary changes made  What exercise is being done to improve your Blood Pressure Control?  Patient walks daily  Adherence Review: Is the patient currently on ACE/ARB medication? No Does the patient have >5 day gap between last estimated fill dates? No  Star Rating Drugs:  None  Gaps: AWV - completed 11/10/2021 and scheduled 11/15/2022 Tdap - overdue Covid - overdue   Chart Updates:  Recent office visits:  05/01/2023 [name] MD - Patient was seen for a physical exam. No medication changes.   03/23/2022 [name] MD - Patient was seen for Parkinsonism. No additional chart notes.   Recent consult visits:  06/22/2022 Wells Guiles Tat DO (neurology) - Patient was seen for Parkinson's disease without dyskinesia, with fluctuating manifestations and additional concerns. No medication changes.  06/14/2022 Cassandra Kavuri (Derm) - Patient was seen for multiple  benign melanocytic nevi and additional concerns. No additional chart notes.   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 No additional chart notes.   Hospital visits:  None  Medications: Outpatient Encounter Medications as of 06/25/2022  Medication Sig   aspirin EC 81 MG tablet Take 81 mg by mouth daily. Swallow whole.   carbidopa-levodopa (SINEMET IR) 25-100 MG tablet TAKE 2 TABLETS AT 6AM/9AM/NOON/3PM/6PM/9PM   dicyclomine (BENTYL) 10 MG capsule TAKE 1 CAPSULE BY MOUTH EVERY 6 HOURS AS NEEDED   melatonin 5 MG TABS Take 5 mg by mouth at bedtime.   midodrine (PROAMATINE) 5 MG tablet 2 TABLETS AT 7AM, 1 TABLET AT 11AM, 1 TABLET AT 3PM (Patient taking differently: 1 TABLETS AT 7AM, 2 TABLET AT 11AM, 1 TABLET AT 3PM)   polyethylene glycol powder (GLYCOLAX/MIRALAX) 17 GM/SCOOP powder Take 17 g by mouth daily.   Probiotic Product (PROBIOTIC PO) Take 1 capsule by mouth daily as needed.   Propylene Glycol (SYSTANE COMPLETE OP) Apply 1 drop to eye daily.   pyridostigmine (MESTINON) 60 MG tablet Take 0.5 tablets (30 mg total) by mouth 2 (two) times daily. (Patient not taking: Reported on 06/22/2022)   Simethicone (GAS-X PO) Take 1 tablet by mouth as needed. After meals and at bedtime   VITAMIN E PO Take 1 capsule by mouth daily.   No facility-administered encounter medications on file as of 06/25/2022.  Fill History:   Dispensed Days Supply Quantity Provider Pharmacy  CARBIDOPA-LEVODOPA 25-100 TAB 05/03/2022 90 1,080 each      Dispensed Days Supply Quantity  Provider Pharmacy  DICYCLOMINE 10 MG CAPSULE 02/03/2022 90 360 each      Dispensed Days Supply Quantity Provider Pharmacy  MIDODRINE HCL 5 MG TABLET 05/21/2022 90 360 each      Dispensed Days Supply Quantity Provider Pharmacy  PYRIDOSTIGMINE BR 60 MG TABLET 03/25/2022 90 90 each      Recent Office Vitals: BP Readings from Last 3 Encounters:  06/22/22 118/72   04/30/22 (!) 160/82  03/04/22 130/80   Pulse Readings from Last 3 Encounters:  06/22/22 71  04/30/22 (!) 59  03/04/22 66    Wt Readings from Last 3 Encounters:  06/22/22 107 lb 6.4 oz (48.7 kg)  04/30/22 109 lb 4.8 oz (49.6 kg)  03/04/22 106 lb (48.1 kg)     Kidney Function Lab Results  Component Value Date/Time   CREATININE 0.73 05/29/2021 03:14 PM   CREATININE 0.71 04/28/2021 01:53 PM   CREATININE 0.73 02/27/2020 11:53 AM   CREATININE 0.78 12/26/2019 02:54 PM   GFR 80.69 04/28/2021 01:53 PM   GFRNONAA >60 05/29/2021 03:14 PM   GFRAA 90 01/03/2017 03:15 PM       Latest Ref Rng & Units 05/29/2021    3:14 PM 04/28/2021    1:53 PM 02/27/2020   11:53 AM  BMP  Glucose 70 - 99 mg/dL 94  96  90   BUN 8 - 23 mg/dL '14  14  10   '$ Creatinine 0.44 - 1.00 mg/dL 0.73  0.71  0.73   Sodium 135 - 145 mmol/L 134  135  131   Potassium 3.5 - 5.1 mmol/L 4.1  4.1  3.8   Chloride 98 - 111 mmol/L 99  99  97   CO2 22 - 32 mmol/L '29  31  29   '$ Calcium 8.9 - 10.3 mg/dL 9.9  9.7  9.6    [place] Pharmacist Assistant [phone]

## 2022-07-28 ENCOUNTER — Ambulatory Visit: Payer: Medicare HMO

## 2022-07-28 DIAGNOSIS — I442 Atrioventricular block, complete: Secondary | ICD-10-CM

## 2022-07-29 LAB — CUP PACEART REMOTE DEVICE CHECK
Battery Impedance: 2111 Ohm
Battery Remaining Longevity: 32 mo
Battery Voltage: 2.75 V
Brady Statistic AP VP Percent: 16 %
Brady Statistic AP VS Percent: 0 %
Brady Statistic AS VP Percent: 84 %
Brady Statistic AS VS Percent: 0 %
Date Time Interrogation Session: 20240307101052
Implantable Lead Connection Status: 753985
Implantable Lead Connection Status: 753985
Implantable Lead Implant Date: 20131014
Implantable Lead Implant Date: 20131014
Implantable Lead Location: 753859
Implantable Lead Location: 753860
Implantable Lead Model: 5076
Implantable Lead Model: 5076
Implantable Pulse Generator Implant Date: 20131014
Lead Channel Impedance Value: 407 Ohm
Lead Channel Impedance Value: 673 Ohm
Lead Channel Pacing Threshold Amplitude: 0.5 V
Lead Channel Pacing Threshold Amplitude: 0.75 V
Lead Channel Pacing Threshold Pulse Width: 0.4 ms
Lead Channel Pacing Threshold Pulse Width: 0.4 ms
Lead Channel Setting Pacing Amplitude: 2 V
Lead Channel Setting Pacing Amplitude: 2.5 V
Lead Channel Setting Pacing Pulse Width: 0.4 ms
Lead Channel Setting Sensing Sensitivity: 4 mV
Zone Setting Status: 755011
Zone Setting Status: 755011

## 2022-07-30 ENCOUNTER — Other Ambulatory Visit: Payer: Self-pay | Admitting: Neurology

## 2022-07-30 DIAGNOSIS — G20A1 Parkinson's disease without dyskinesia, without mention of fluctuations: Secondary | ICD-10-CM

## 2022-08-15 ENCOUNTER — Other Ambulatory Visit: Payer: Self-pay | Admitting: Neurology

## 2022-08-15 DIAGNOSIS — G20A1 Parkinson's disease without dyskinesia, without mention of fluctuations: Secondary | ICD-10-CM

## 2022-08-15 DIAGNOSIS — R4182 Altered mental status, unspecified: Secondary | ICD-10-CM

## 2022-08-15 DIAGNOSIS — G903 Multi-system degeneration of the autonomic nervous system: Secondary | ICD-10-CM

## 2022-08-31 ENCOUNTER — Telehealth: Payer: Self-pay

## 2022-08-31 NOTE — Progress Notes (Signed)
Remote pacemaker transmission.   

## 2022-08-31 NOTE — Progress Notes (Signed)
Care Management & Coordination Services Pharmacy Team  Reason for Encounter: Appointment Reminder  Contacted patient to confirm telephone appointment with Delano Metz, PharmD on 09/01/2022 at 3:30. Unsuccessful outreach. Left voicemail for patient to return call.  Do you have any problems getting your medications?  If yes what types of problems are you experiencing?   What is your top health concern you would like to discuss at your upcoming visit?   Have you seen any other providers since your last visit with PCP?   Gaps: AWV - completed 11/10/2021 and scheduled 11/15/2022 Last BP - 118/72 on 06/22/2022 Tdap - overdue Covid - overdue  Star Rating Drugs:  None  Inetta Fermo [facility]  Clinical Pharmacist Assistant [phone]

## 2022-09-01 NOTE — Progress Notes (Unsigned)
Care Management & Coordination Services Pharmacy Note  09/01/2022 Name:  Lindsey KehrKaren M Sachdeva MRN:  [id number] DOB:  05-12-1941  Summary: ***  Recommendations/Changes made from today's visit: ***  Follow up plan: ***   Subjective: Lindsey Bryant is an 82 y.o. year old female who is a primary patient of Burchette, Elberta FortisBruce W, MD.  The care coordination team was consulted for assistance with disease management and care coordination needs.    {CCMTELEPHONEFACETOFACE:21091510} for follow up visit.  Recent office visits: 05/01/2023 Evelena PeatBruce Burchette MD - Patient was seen for a physical exam. No medication changes.    03/23/2022 Evelena PeatBruce Burchette MD - Patient was seen for Parkinsonism. No additional chart notes  Recent consult visits: 06/22/2022 Lurena Joinerebecca Tat DO (neurology) - Patient was seen for Parkinson's disease without dyskinesia, with fluctuating manifestations and additional concerns. No medication changes.   06/14/2022 Cassandra Kavuri (Derm) - Patient was seen for multiple benign melanocytic nevi and additional concerns. No additional chart notes.    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 No additional chart notes.   Hospital visits: None in previous 6 months   Objective:  Lab Results  Component Value Date   CREATININE 0.73 05/29/2021   BUN 14 05/29/2021   GFR 80.69 04/28/2021   GFRNONAA >60 05/29/2021   GFRAA 90 01/03/2017   NA 134 (L) 05/29/2021   K 4.1 05/29/2021   CALCIUM 9.9 05/29/2021   CO2 29 05/29/2021   GLUCOSE 94 05/29/2021    Lab Results  Component Value Date/Time   HGBA1C 5.3 07/29/2016 12:42 PM   GFR 80.69 04/28/2021 01:53 PM   GFR 78.57 02/27/2020 11:53 AM    Last diabetic Eye exam: No results found for: "HMDIABEYEEXA"  Last diabetic Foot exam: No results found for: "HMDIABFOOTEX"   Lab Results  Component Value Date   CHOL 198 04/28/2021   HDL 83.80 04/28/2021    LDLCALC 105 (H) 04/28/2021   LDLDIRECT 111.7 06/05/2012   TRIG 49.0 04/28/2021   CHOLHDL 2 04/28/2021       Latest Ref Rng & Units 05/29/2021    3:14 PM 04/28/2021    1:53 PM 02/27/2020   11:53 AM  Hepatic Function  Total Protein 6.5 - 8.1 g/dL 6.8  6.6  7.0   Albumin 3.5 - 5.0 g/dL 4.1  4.2  4.4   AST 15 - 41 U/L 11  16  15    ALT 0 - 44 U/L 5  3  2    Alk Phosphatase 38 - 126 U/L 89  62  48   Total Bilirubin 0.3 - 1.2 mg/dL 0.7  0.5  0.5   Bilirubin, Direct 0.0 - 0.3 mg/dL  0.1      Lab Results  Component Value Date/Time   TSH 1.22 04/28/2021 01:53 PM   TSH 1.45 12/26/2019 02:54 PM   FREET4 0.84 07/29/2016 12:08 PM       Latest Ref Rng & Units 02/19/2022    4:47 PM 05/29/2021    3:14 PM 04/28/2021    1:53 PM  CBC  WBC 3.4 - 10.8 x10E3/uL 6.0  8.4  5.9   Hemoglobin 11.1 - 15.9 g/dL 40.911.8  81.110.8  91.412.7   Hematocrit 34.0 - 46.6 % 34.1  31.9  38.3   Platelets 150 - 450 x10E3/uL 173  282  172.0     Lab Results  Component Value Date/Time   VD25OH 80 03/24/2009 08:48 PM   VITAMINB12 620  06/12/2021 02:05 PM   VITAMINB12 432 03/03/2012 03:23 PM    Clinical ASCVD: Yes  The ASCVD Risk score (Arnett DK, et al., 2019) failed to calculate for the following reasons:   The 2019 ASCVD risk score is only valid for ages 101 to 41   The patient has a prior MI or stroke diagnosis    DEXA 06/12/18 RFN -1.2 FRAX Major Fracture risk <20%, Hip fracture risk <3%     04/30/2022    2:30 PM 11/10/2021    1:40 PM 07/15/2021   11:21 AM  Depression screen PHQ 2/9  Decreased Interest 0 0 0  Down, Depressed, Hopeless 0 0 0  PHQ - 2 Score 0 0 0     Social History   Tobacco Use  Smoking Status Former  Smokeless Tobacco Never  Tobacco Comments   Smoked cigarettes for 15 yrs    BP Readings from Last 3 Encounters:  06/22/22 118/72  04/30/22 (!) 160/82  03/04/22 130/80   Pulse Readings from Last 3 Encounters:  06/22/22 71  04/30/22 (!) 59  03/04/22 66   Wt Readings from Last 3 Encounters:   06/22/22 107 lb 6.4 oz (48.7 kg)  04/30/22 109 lb 4.8 oz (49.6 kg)  03/04/22 106 lb (48.1 kg)   BMI Readings from Last 3 Encounters:  06/22/22 19.64 kg/m  04/30/22 19.86 kg/m  03/04/22 17.11 kg/m    Allergies  Allergen Reactions   Oxycodone Other (See Comments)    Medications Reviewed Today     Reviewed by Ashok Norris, CMA (Certified Medical Assistant) on 06/22/22 at 1508  Med List Status: <None>   Medication Order Taking? Sig Documenting Provider Last Dose Status Informant  aspirin EC 81 MG tablet [id number] Yes Take 81 mg by mouth daily. Swallow whole. [provider] Taking Active   carbidopa-levodopa (SINEMET IR) 25-100 MG tablet [id number] Yes TAKE 2 TABLETS AT 6AM/9AM/NOON/3PM/6PM/9PM Tat, Octaviano Batty, DO Taking Active   dicyclomine (BENTYL) 10 MG capsule [id number] Yes TAKE 1 CAPSULE BY MOUTH EVERY 6 HOURS AS NEEDED Iva Boop, MD Taking Active   melatonin 5 MG TABS [id number] Yes Take 5 mg by mouth at bedtime. [provider] Taking Active Self  midodrine (PROAMATINE) 5 MG tablet [id number] Yes 2 TABLETS AT 7AM, 1 TABLET AT 11AM, 1 TABLET AT 3PM  Patient taking differently: 1 TABLETS AT 7AM, 2 TABLET AT 11AM, 1 TABLET AT 3PM   Tat, Rebecca S, DO Taking Active   polyethylene glycol powder (GLYCOLAX/MIRALAX) 17 GM/SCOOP powder [id number] Yes Take 17 g by mouth daily. [provider] Taking Active   Probiotic Product (PROBIOTIC PO) [id number] Yes Take 1 capsule by mouth daily as needed. [provider] Taking Active   Propylene Glycol (SYSTANE COMPLETE OP) [id number] Yes Apply 1 drop to eye daily. [provider] Taking Active   pyridostigmine (MESTINON) 60 MG tablet [id number] Yes Take 0.5 tablets (30 mg total) by mouth 2 (two) times daily. Duke Salvia, MD Taking Active   Simethicone (GAS-X PO) [id number] Yes Take 1 tablet by mouth as needed. After meals and at bedtime [provider] Taking Active   VITAMIN E PO  [id number] Yes Take 1 capsule by mouth daily. [provider] Taking Active Self            SDOH:  (Social Determinants of Health) assessments and interventions performed: Yes SDOH Interventions    Flowsheet Row Chronic Care Management from 03/12/2022 in [facility] [place] Clinical Support  from 11/10/2021 in [facility] [place] Clinical Support from 10/14/2020 in [facility] [place] Chronic Care Management from 12/25/2019 in [facility] [place] Chronic Care Management from 12/11/2019 in [facility] [place]  SDOH Interventions       Food Insecurity Interventions -- Intervention Not Indicated Intervention Not Indicated -- --  Housing Interventions -- Intervention Not Indicated Intervention Not Indicated -- --  Transportation Interventions Intervention Not Indicated Intervention Not Indicated -- Intervention Not Indicated Intervention Not Indicated  Financial Strain Interventions -- Intervention Not Indicated -- -- Intervention Not Indicated  Physical Activity Interventions -- Intervention Not Indicated Intervention Not Indicated -- --  Stress Interventions -- Intervention Not Indicated Intervention Not Indicated -- --  Social Connections Interventions -- Intervention Not Indicated Intervention Not Indicated -- --       Medication Assistance: {MEDASSISTANCEINFO:25044}  Medication Access: Within the past 30 days, how often has patient missed a dose of medication? *** Is a pillbox or other method used to improve adherence? {YES/NO:21197} Factors that may affect medication adherence? {CHL DESC; BARRIERS:21522} Are meds synced by current pharmacy? {YES/NO:21197} Are meds delivered by current pharmacy? {YES/NO:21197} Does patient experience delays in picking up medications due to transportation concerns? {YES/NO:21197}  [facility] Services Reviewed: Is patient  disadvantaged to use [facility]?: Yes  Current Rx insurance plan: [employer] Name and location of Current pharmacy:  [id number] - [place], [place] - [address] [place] [address] Phone: [phone] Fax: [phone]  [id number] - [place], [place] - [address] AT [facility] [address] [place] [address] Phone: [phone] Fax: [phone]  [facility] services reviewed with patient today?: No  Patient requests to transfer care to [facility]?: No  Reason patient declined to change pharmacies: Disadvantaged due to insurance/mail order  Compliance/Adherence/Medication fill history: Care Gaps: AWV - completed 11/10/2021 and scheduled 11/15/2022 Last BP - 118/72 on 06/22/2022 Tdap - overdue Covid - overdue  Star-Rating Drugs: None   Assessment/Plan   Hypertension/Hypotension (BP goal <130/80, >100/60) -{US controlled/uncontrolled:25276} -Current treatment: Midodrine 5mg  2 tabs at 7am, 1 at 11am, 1 at 3pm -Medications previously tried: Lisinopril  -Current home readings: *** -Current dietary habits: *** -Current exercise habits: *** -{ACTIONS;DENIES/REPORTS:21021675::"Denies"} hypotensive/hypertensive symptoms -Educated on {CCM BP Counseling:25124} -Counseled to monitor BP at home ***, document, and provide log at future appointments -{CCMPHARMDINTERVENTION:25122}  CAD (Goal: Slow progression of atherosclerosis (plaques / blockages) throughout your body to reduce risk of heart attack and strokes) {US controlled/uncontrolled:25276:::1} Current Medication Therapy: Aspirin 81mg  1 qd Medications Previously Tried: Atorvastatin (stomach upset) Chest Pain in last 3 months: {YES/NO} Any signs of bleed? Counseled on:   Sherrill Raring Clinical Pharmacist [phone]

## 2022-10-24 ENCOUNTER — Other Ambulatory Visit: Payer: Self-pay | Admitting: Neurology

## 2022-10-24 DIAGNOSIS — G20A1 Parkinson's disease without dyskinesia, without mention of fluctuations: Secondary | ICD-10-CM

## 2022-10-27 ENCOUNTER — Ambulatory Visit (INDEPENDENT_AMBULATORY_CARE_PROVIDER_SITE_OTHER): Payer: Medicare HMO

## 2022-10-27 DIAGNOSIS — I428 Other cardiomyopathies: Secondary | ICD-10-CM | POA: Diagnosis not present

## 2022-10-27 LAB — CUP PACEART REMOTE DEVICE CHECK
Battery Impedance: 2211 Ohm
Battery Remaining Longevity: 32 mo
Battery Voltage: 2.75 V
Brady Statistic AP VP Percent: 17 %
Brady Statistic AP VS Percent: 0 %
Brady Statistic AS VP Percent: 83 %
Brady Statistic AS VS Percent: 0 %
Date Time Interrogation Session: 20240605114802
Implantable Lead Connection Status: 753985
Implantable Lead Connection Status: 753985
Implantable Lead Implant Date: 20131014
Implantable Lead Implant Date: 20131014
Implantable Lead Location: 753859
Implantable Lead Location: 753860
Implantable Lead Model: 5076
Implantable Lead Model: 5076
Implantable Pulse Generator Implant Date: 20131014
Lead Channel Impedance Value: 415 Ohm
Lead Channel Impedance Value: 742 Ohm
Lead Channel Pacing Threshold Amplitude: 0.5 V
Lead Channel Pacing Threshold Amplitude: 0.875 V
Lead Channel Pacing Threshold Pulse Width: 0.4 ms
Lead Channel Pacing Threshold Pulse Width: 0.4 ms
Lead Channel Setting Pacing Amplitude: 2 V
Lead Channel Setting Pacing Amplitude: 2.5 V
Lead Channel Setting Pacing Pulse Width: 0.4 ms
Lead Channel Setting Sensing Sensitivity: 4 mV
Zone Setting Status: 755011
Zone Setting Status: 755011

## 2022-10-28 ENCOUNTER — Encounter: Payer: Self-pay | Admitting: Internal Medicine

## 2022-11-01 ENCOUNTER — Encounter: Payer: Self-pay | Admitting: Neurology

## 2022-11-11 ENCOUNTER — Other Ambulatory Visit: Payer: Self-pay | Admitting: Neurology

## 2022-11-11 DIAGNOSIS — G903 Multi-system degeneration of the autonomic nervous system: Secondary | ICD-10-CM

## 2022-11-11 DIAGNOSIS — R4182 Altered mental status, unspecified: Secondary | ICD-10-CM

## 2022-11-11 DIAGNOSIS — G20A1 Parkinson's disease without dyskinesia, without mention of fluctuations: Secondary | ICD-10-CM

## 2022-11-15 ENCOUNTER — Ambulatory Visit (INDEPENDENT_AMBULATORY_CARE_PROVIDER_SITE_OTHER): Payer: Medicare HMO

## 2022-11-15 VITALS — Ht 62.0 in | Wt 107.0 lb

## 2022-11-15 DIAGNOSIS — Z0000 Encounter for general adult medical examination without abnormal findings: Secondary | ICD-10-CM | POA: Diagnosis not present

## 2022-11-15 NOTE — Progress Notes (Signed)
Subjective:   Lindsey Bryant is a 82 y.o. female who presents for Medicare Annual (Subsequent) preventive examination.  Visit Complete: Virtual  I connected with  Lindsey Bryant on 11/15/22 by a audio enabled telemedicine application and verified that I am speaking with the correct person using two identifiers.  Patient Location: Home  Provider Location: Home Office  I discussed the limitations of evaluation and management by telemedicine. The patient expressed understanding and agreed to proceed.  Patient Medicare AWV questionnaire was completed by the patient on ; I have confirmed that all information answered by patient is correct and no changes since this date.  Review of Systems     Cardiac Risk Factors include: advanced age (>12men, >68 women)     Objective:    Today's Vitals   11/15/22 1306  Weight: 107 lb (48.5 kg)  Height: 5\' 2"  (1.575 m)   Body mass index is 19.57 kg/m.     11/15/2022    1:13 PM 06/22/2022    3:13 PM 12/15/2021    2:27 PM 11/10/2021    1:44 PM 06/02/2021    3:18 PM 05/29/2021    2:47 PM 05/09/2021    5:44 PM  Advanced Directives  Does Patient Have a Medical Advance Directive? Yes Yes Yes Yes Yes Yes No  Type of Estate agent of [place];Living will Living will Healthcare Power of [place];Living will;Out of facility DNR (pink MOST or yellow form) Healthcare Power of [place];Living will Living will    Does patient want to make changes to medical advance directive?    No - Patient declined     Copy of Healthcare Power of Attorney in Chart? No - copy requested   No - copy requested     Would patient like information on creating a medical advance directive?       No - Patient declined    Current Medications (verified) Outpatient Encounter Medications as of 11/15/2022  Medication Sig   aspirin EC 81 MG tablet Take 81 mg by mouth daily. Swallow whole.   carbidopa-levodopa (SINEMET IR) 25-100 MG tablet TAKE 2 TABLETS AT  6AM/9AM/NOON/3PM/6PM/9PM   dicyclomine (BENTYL) 10 MG capsule TAKE 1 CAPSULE BY MOUTH EVERY 6 HOURS AS NEEDED   melatonin 5 MG TABS Take 5 mg by mouth at bedtime.   midodrine (PROAMATINE) 5 MG tablet TAKE 1 TABLET BY MOUTH AT 7AM, 1 TABLET AT 11AM, 1 TABLET AT 3PM   polyethylene glycol powder (GLYCOLAX/MIRALAX) 17 GM/SCOOP powder Take 17 g by mouth daily.   Probiotic Product (PROBIOTIC PO) Take 1 capsule by mouth daily as needed.   Propylene Glycol (SYSTANE COMPLETE OP) Apply 1 drop to eye daily.   pyridostigmine (MESTINON) 60 MG tablet Take 0.5 tablets (30 mg total) by mouth 2 (two) times daily. (Patient not taking: Reported on 06/22/2022)   Simethicone (GAS-X PO) Take 1 tablet by mouth as needed. After meals and at bedtime   VITAMIN E PO Take 1 capsule by mouth daily.   No facility-administered encounter medications on file as of 11/15/2022.    Allergies (verified) Oxycodone   History: Past Medical History:  Diagnosis Date   Acute systolic heart failure (HCC)    Arm fracture    Bradycardia    a. 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   Parkinson's disease  Past Surgical History:  Procedure Laterality Date   APPENDECTOMY  1952   COLONOSCOPY     EYE SURGERY  07/2017   Cataract Sx   LEFT HEART CATH AND CORONARY ANGIOGRAPHY N/A 08/03/2016   Procedure: Left Heart Cath and Coronary Angiography;  Surgeon: Marykay Lex, MD;  Location: [facility] INVASIVE CV LAB;  Service: Cardiovascular;  Laterality: N/A;   MELANOMA EXCISION Right 2000   right arm   PACEMAKER PLACEMENT  05/2012   PACEMAKER REVISION N/A 03/14/2012   Procedure: PACEMAKER REVISION;  Surgeon: Hillis Range, MD;  Location: [facility] CATH LAB;  Service: Cardiovascular;  Laterality: N/A;   PERMANENT PACEMAKER INSERTION N/A 03/06/2012   Procedure: PERMANENT PACEMAKER INSERTION;  Surgeon: Duke Salvia, MD;  Location: [facility] CATH LAB;  Service: Cardiovascular;  Laterality: N/A;   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   Family History  Problem Relation Age of Onset   Heart attack Mother 65       Details unclear   Alcohol abuse Father    Breast cancer Sister 54   Diabetes Brother    Hypothyroidism Daughter    Hypothyroidism Daughter    Breast cancer Other    Colon cancer Neg Hx    Esophageal cancer Neg Hx    Stomach cancer Neg Hx    Pancreatic cancer Neg Hx    Liver disease Neg Hx    Social History   Socioeconomic History   Marital status: Married    Spouse name: Not on file   Number of children: 2   Years of education: Not on file   Highest education level: Not on file  Occupational History   Not on file  Tobacco Use   Smoking status: Former   Smokeless tobacco: Never   Tobacco comments:    Smoked cigarettes for 15 yrs   Vaping Use   Vaping Use: Never used  Substance and Sexual Activity   Alcohol use: No    Comment: recovering alcoholic   Drug use: No   Sexual activity: Not on file  Other Topics Concern   Not on file  Social History Narrative   Retired Runner, broadcasting/film/video, married. 2 daughters I think.   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      Right Handed   Social Determinants of Health   Financial Resource Strain: Low Risk  (11/15/2022)   Overall Financial Resource Strain (CARDIA)    Difficulty of Paying Living Expenses: Not hard at all  Food Insecurity: No Food Insecurity (11/15/2022)   Hunger Vital Sign    Worried About Running Out of Food in the Last Year: Never true    Ran Out of Food in the Last Year: Never true  Transportation Needs: No Transportation Needs (11/15/2022)   PRAPARE - Administrator, Civil Service (Medical): No    Lack of Transportation (Non-Medical): No  Physical Activity: Insufficiently Active (11/15/2022)   Exercise Vital Sign    Days of Exercise per Week: 3 days    Minutes of Exercise per  Session: 30 min  Stress: No Stress Concern Present (11/15/2022)   [employer] of Occupational Health - Occupational Stress Questionnaire    Feeling of Stress : Not at all  Social Connections: Socially Integrated (11/15/2022)   Social Connection and Isolation Panel [NHANES]    Frequency of Communication with Friends and Family: More than three times a week    Frequency of Social Gatherings with Friends and Family:  More than three times a week    Attends Religious Services: More than 4 times per year    Active Member of Clubs or Organizations: Yes    Attends Banker Meetings: More than 4 times per year    Marital Status: Married    Tobacco Counseling Counseling given: Not Answered Tobacco comments: Smoked cigarettes for 15 yrs    Clinical Intake:  Pre-visit preparation completed: No  Pain : No/denies pain     BMI - recorded: 19.57 Nutritional Status: BMI of 19-24  Normal Nutritional Risks: None Diabetes: No  How often do you need to have someone help you when you read instructions, pamphlets, or other written materials from your doctor or pharmacy?: 1 - Never  Interpreter Needed?: No  Information entered by :: Theresa Mulligan LPN   Activities of Daily Living    11/15/2022    1:12 PM  In your present state of health, do you have any difficulty performing the following activities:  Hearing? 0  Vision? 0  Difficulty concentrating or making decisions? 0  Walking or climbing stairs? 0  Dressing or bathing? 0  Doing errands, shopping? 0  Preparing Food and eating ? N  Using the Toilet? N  In the past six months, have you accidently leaked urine? N  Do you have problems with loss of bowel control? N  Managing your Medications? N  Managing your Finances? N  Housekeeping or managing your Housekeeping? N    Patient Care Team: Kristian Covey, MD as PCP - General (Family Medicine) Duke Salvia, MD as PCP - Cardiology (Cardiology) Duke Salvia,  MD as PCP - Electrophysiology (Cardiology) Yolonda Kida, MD as Referring Physician (Neurology) Tat, Octaviano Batty, DO as Consulting Physician (Neurology) Verner Chol, [facility] (Inactive) as Pharmacist (Pharmacist)  Indicate any recent Medical Services you may have received from other than [facility] providers in the past year (date may be approximate).     Assessment:   This is a routine wellness examination for Lindsey Bryant.  Hearing/Vision screen Hearing Screening - Comments:: Denies hearing difficulties   Vision Screening - Comments:: Wears rx glasses - up to date with routine eye exams with  Dr Earl Gala  Dietary issues and exercise activities discussed:     Goals Addressed               This Visit's Progress     Continue walking (pt-stated)         Depression Screen    11/15/2022    1:12 PM 04/30/2022    2:30 PM 11/10/2021    1:40 PM 07/15/2021   11:21 AM 10/14/2020    3:22 PM 10/14/2020    3:21 PM 03/20/2018    3:12 PM  PHQ 2/9 Scores  PHQ - 2 Score 0 0 0 0 0 0 0    Fall Risk    11/15/2022    1:13 PM 04/30/2022    2:30 PM 12/15/2021    2:27 PM 11/10/2021    1:42 PM 07/15/2021   11:21 AM  Fall Risk   Falls in the past year? 0 1 0 1 1  Number falls in past yr: 0 0 0 0 0  Injury with Fall? 0 1 0 1 1  Comment    Fx rt arm. Followed by Delcie Roch.   Risk for fall due to : No Fall Risks No Fall Risks  Orthopedic patient History of fall(s)  Follow up Falls prevention discussed Falls evaluation completed  Falls evaluation completed    MEDICARE RISK AT HOME:  Medicare Risk at Home - 11/15/22 1316     Any stairs in or around the home? No    If so, are there any without handrails? No    Home free of loose throw rugs in walkways, pet beds, electrical cords, etc? Yes    Adequate lighting in your home to reduce risk of falls? Yes    Life alert? No    Use of a cane, walker or w/c? No    Grab bars in the bathroom? Yes    Shower chair or bench in shower? Yes    Elevated toilet  seat or a handicapped toilet? Yes             TIMED UP AND GO:  Was the test performed?  No    Cognitive Function:        11/15/2022    1:14 PM 11/10/2021    1:44 PM  6CIT Screen  What Year? 0 points 0 points  What month? 0 points 0 points  What time? 0 points 0 points  Count back from 20 0 points 0 points  Months in reverse 0 points 0 points  Repeat phrase 0 points 0 points  Total Score 0 points 0 points    Immunizations Immunization History  Administered Date(s) Administered   Influenza Split 03/03/2012   Influenza Whole 03/24/2007, 02/26/2008, 03/13/2010   Influenza, High Dose Seasonal PF 03/09/2013, 02/17/2015, 01/29/2017, 02/08/2018, 02/19/2019, 03/03/2021, 01/11/2022   Influenza, Seasonal, Injecte, Preservative Fre 03/10/2014   Influenza-Unspecified 02/24/2016   Moderna Sars-Covid-2 Vaccination 06/23/2019, 07/21/2019   Pneumococcal Conjugate-13 07/17/2014   Pneumococcal Polysaccharide-23 05/24/2006   Tdap 06/04/2011   Zoster Recombinat (Shingrix) 04/06/2018, 06/15/2018, 07/21/2018   Zoster, Live 02/26/2008    TDAP status: Due, Education has been provided regarding the importance of this vaccine. Advised may receive this vaccine at local pharmacy or Health Dept. Aware to provide a copy of the vaccination record if obtained from local pharmacy or Health Dept. Verbalized acceptance and understanding.  Flu Vaccine status: Up to date    Covid-19 vaccine status: Completed vaccines  Qualifies for Shingles Vaccine? Yes   Zostavax completed Yes   Shingrix Completed?: Yes  Screening Tests Health Maintenance  Topic Date Due   DTaP/Tdap/Td (2 - Td or Tdap) 06/03/2021   COVID-19 Vaccine (4 - 2023-24 season) 05/26/2022   INFLUENZA VACCINE  12/23/2022   Medicare Annual Wellness (AWV)  11/15/2023   DEXA SCAN  Completed   Zoster Vaccines- Shingrix  Completed   HPV VACCINES  Aged Out   Pneumonia Vaccine 74+ Years old  Discontinued    Health  Maintenance  Health Maintenance Due  Topic Date Due   DTaP/Tdap/Td (2 - Td or Tdap) 06/03/2021   COVID-19 Vaccine (4 - 2023-24 season) 05/26/2022    Colorectal cancer screening: No longer required.   Mammogram status: No longer required due to Age.  Bone Density status: Completed 06/12/18. Results reflect: Bone density results: OSTEOPENIA. Repeat every   years.  Lung Cancer Screening: (Low Dose CT Chest recommended if Age 74-80 years, 20 pack-year currently smoking OR have quit w/in 15years.) does not qualify.     Additional Screening:  Hepatitis C Screening: does not qualify; Completed   Vision Screening: Recommended annual ophthalmology exams for early detection of glaucoma and other disorders of the eye. Is the patient up to date with their annual eye exam?  Yes  Who is the provider or what is  the name of the office in which the patient attends annual eye exams? Dr Cathey Endow If pt is not established with a provider, would they like to be referred to a provider to establish care? No .   Dental Screening: Recommended annual dental exams for proper oral hygiene    Community Resource Referral / Chronic Care Management:  CRR required this visit?  No   CCM required this visit?  No     Plan:     I have personally reviewed and noted the following in the patient's chart:   Medical and social history Use of alcohol, tobacco or illicit drugs  Current medications and supplements including opioid prescriptions. Patient is not currently taking opioid prescriptions. Functional ability and status Nutritional status Physical activity Advanced directives List of other physicians Hospitalizations, surgeries, and ER visits in previous 12 months Vitals Screenings to include cognitive, depression, and falls Referrals and appointments  In addition, I have reviewed and discussed with patient certain preventive protocols, quality metrics, and best practice recommendations. A written  personalized care plan for preventive services as well as general preventive health recommendations were provided to patient.     Tillie Rung, LPN   01/20/5620   After Visit Summary: (MyChart) Due to this being a telephonic visit, the after visit summary with patients personalized plan was offered to patient via MyChart   Nurse Notes: None

## 2022-11-15 NOTE — Patient Instructions (Addendum)
[name] , Thank you for taking time to come for your Medicare Wellness Visit. I appreciate your ongoing commitment to your health goals. Please review the following plan we discussed and let me know if I can assist you in the future.   These are the goals we discussed:  Goals       Continue walking (pt-stated)      No current goals (pt-stated)        This is a list of the screening recommended for you and due dates:  Health Maintenance  Topic Date Due   DTaP/Tdap/Td vaccine (2 - Td or Tdap) 06/03/2021   COVID-19 Vaccine (4 - 2023-24 season) 05/26/2022   Flu Shot  12/23/2022   Medicare Annual Wellness Visit  11/15/2023   DEXA scan (bone density measurement)  Completed   Zoster (Shingles) Vaccine  Completed   HPV Vaccine  Aged Out   Pneumonia Vaccine  Discontinued    Advanced directives: Please bring a copy of your health care power of attorney and living will to the office to be added to your chart at your convenience.   Conditions/risks identified: None  Next appointment: Follow up in one year for your annual wellness visit    Preventive Care 65 Years and Older, Female Preventive care refers to lifestyle choices and visits with your health care provider that can promote health and wellness. What does preventive care include? A yearly physical exam. This is also called an annual well check. Dental exams once or twice a year. Routine eye exams. Ask your health care provider how often you should have your eyes checked. Personal lifestyle choices, including: Daily care of your teeth and gums. Regular physical activity. Eating a healthy diet. Avoiding tobacco and drug use. Limiting alcohol use. Practicing safe sex. Taking low-dose aspirin every day. Taking vitamin and mineral supplements as recommended by your health care provider. What happens during an annual well check? The services and screenings done by your health care provider during your annual well check will  depend on your age, overall health, lifestyle risk factors, and family history of disease. Counseling  Your health care provider may ask you questions about your: Alcohol use. Tobacco use. Drug use. Emotional well-being. Home and relationship well-being. Sexual activity. Eating habits. History of falls. Memory and ability to understand (cognition). Work and work Astronomer. Reproductive health. Screening  You may have the following tests or measurements: Height, weight, and BMI. Blood pressure. Lipid and cholesterol levels. These may be checked every 5 years, or more frequently if you are over 16 years old. Skin check. Lung cancer screening. You may have this screening every year starting at age 31 if you have a 30-pack-year history of smoking and currently smoke or have quit within the past 15 years. Fecal occult blood test (FOBT) of the stool. You may have this test every year starting at age 36. Flexible sigmoidoscopy or colonoscopy. You may have a sigmoidoscopy every 5 years or a colonoscopy every 10 years starting at age 66. Hepatitis C blood test. Hepatitis B blood test. Sexually transmitted disease (STD) testing. Diabetes screening. This is done by checking your blood sugar (glucose) after you have not eaten for a while (fasting). You may have this done every 1-3 years. Bone density scan. This is done to screen for osteoporosis. You may have this done starting at age 107. Mammogram. This may be done every 1-2 years. Talk to your health care provider about how often you should have regular mammograms.  Talk with your health care provider about your test results, treatment options, and if necessary, the need for more tests. Vaccines  Your health care provider may recommend certain vaccines, such as: Influenza vaccine. This is recommended every year. Tetanus, diphtheria, and acellular pertussis (Tdap, Td) vaccine. You may need a Td booster every 10 years. Zoster vaccine. You may  need this after age 92. Pneumococcal 13-valent conjugate (PCV13) vaccine. One dose is recommended after age 49. Pneumococcal polysaccharide (PPSV23) vaccine. One dose is recommended after age 39. Talk to your health care provider about which screenings and vaccines you need and how often you need them. This information is not intended to replace advice given to you by your health care provider. Make sure you discuss any questions you have with your health care provider. Document Released: 06/06/2015 Document Revised: 01/28/2016 Document Reviewed: 03/11/2015 Elsevier Interactive Patient Education  2017 [employer].  Fall Prevention in the Home Falls can cause injuries. They can happen to people of all ages. There are many things you can do to make your home safe and to help prevent falls. What can I do on the outside of my home? Regularly fix the edges of walkways and driveways and fix any cracks. Remove anything that might make you trip as you walk through a door, such as a raised step or threshold. Trim any bushes or trees on the path to your home. Use bright outdoor lighting. Clear any walking paths of anything that might make someone trip, such as rocks or tools. Regularly check to see if handrails are loose or broken. Make sure that both sides of any steps have handrails. Any raised decks and porches should have guardrails on the edges. Have any leaves, snow, or ice cleared regularly. Use sand or salt on walking paths during winter. Clean up any spills in your garage right away. This includes oil or grease spills. What can I do in the bathroom? Use night lights. Install grab bars by the toilet and in the tub and shower. Do not use towel bars as grab bars. Use non-skid mats or decals in the tub or shower. If you need to sit down in the shower, use a plastic, non-slip stool. Keep the floor dry. Clean up any water that spills on the floor as soon as it happens. Remove soap buildup in  the tub or shower regularly. Attach bath mats securely with double-sided non-slip rug tape. Do not have throw rugs and other things on the floor that can make you trip. What can I do in the bedroom? Use night lights. Make sure that you have a light by your bed that is easy to reach. Do not use any sheets or blankets that are too big for your bed. They should not hang down onto the floor. Have a firm chair that has side arms. You can use this for support while you get dressed. Do not have throw rugs and other things on the floor that can make you trip. What can I do in the kitchen? Clean up any spills right away. Avoid walking on wet floors. Keep items that you use a lot in easy-to-reach places. If you need to reach something above you, use a strong step stool that has a grab bar. Keep electrical cords out of the way. Do not use floor polish or wax that makes floors slippery. If you must use wax, use non-skid floor wax. Do not have throw rugs and other things on the floor that can make  you trip. What can I do with my stairs? Do not leave any items on the stairs. Make sure that there are handrails on both sides of the stairs and use them. Fix handrails that are broken or loose. Make sure that handrails are as long as the stairways. Check any carpeting to make sure that it is firmly attached to the stairs. Fix any carpet that is loose or worn. Avoid having throw rugs at the top or bottom of the stairs. If you do have throw rugs, attach them to the floor with carpet tape. Make sure that you have a light switch at the top of the stairs and the bottom of the stairs. If you do not have them, ask someone to add them for you. What else can I do to help prevent falls? Wear shoes that: Do not have high heels. Have rubber bottoms. Are comfortable and fit you well. Are closed at the toe. Do not wear sandals. If you use a stepladder: Make sure that it is fully opened. Do not climb a closed  stepladder. Make sure that both sides of the stepladder are locked into place. Ask someone to hold it for you, if possible. Clearly mark and make sure that you can see: Any grab bars or handrails. First and last steps. Where the edge of each step is. Use tools that help you move around (mobility aids) if they are needed. These include: Canes. Walkers. Scooters. Crutches. Turn on the lights when you go into a dark area. Replace any light bulbs as soon as they burn out. Set up your furniture so you have a clear path. Avoid moving your furniture around. If any of your floors are uneven, fix them. If there are any pets around you, be aware of where they are. Review your medicines with your doctor. Some medicines can make you feel dizzy. This can increase your chance of falling. Ask your doctor what other things that you can do to help prevent falls. This information is not intended to replace advice given to you by your health care provider. Make sure you discuss any questions you have with your health care provider. Document Released: 03/06/2009 Document Revised: 10/16/2015 Document Reviewed: 06/14/2014 Elsevier Interactive Patient Education  2017 [employer].

## 2022-11-22 NOTE — Progress Notes (Signed)
Remote pacemaker transmission.   

## 2022-12-08 NOTE — Progress Notes (Signed)
Assessment/Plan:   1.  Parkinsons Disease, sx's since 2006  -decrease carbidopa/levodopa 25/100, 2 po 5 times per day (just stopped the dose she was taking at bed)  -start carbidopa/levodopa 50/200 CR at bedtime  -follows with dermatology one time per year.  She is seeing dermatology specialists of [place].      2.  Neurogenic Orthostatic Hypotension  -Continue midodrine, 5 mg, 1 tablet 3 times per day.  Take an extra in the AM if the SBP <110.  She and I did discuss the concept of permissive hypertension.   3.  Abdominal pain with hx of IBS (constipation and diarrhea)  -follows with Dr. Leone Payor.  Encouraged her to make a f/u appt with him as she is having more trouble  -discussed anticholinergic s/e with bentyl.  She is only using occasionally.  4.  Few episodes MS change  -suspect due to Neurogenic Orthostatic Hypotension  5.  Hx of fall with pathologic fracture of the right humerus  -Patient was following to make sure pathologic fracture was not caused by metastatic disease/hematologic disease, but ultimately refused further work-up by oncology.   Subjective:   Lindsey Bryant was seen today in follow up for Parkinsons disease.  My previous records were reviewed prior to todays visit as well as outside records available to me.  Patient with spouse who supplements the history.  Patient emailed Dr. Graciela Husbands in June with some mildly elevated blood pressures.  Somewhere 130s and 140s, she had 1 that was 180s and one 170s.  Dr. Graciela Husbands emailed back and stated he was not so concerned about the blood pressure when she was sitting down, but more importantly she was not falling down.  Unfortunately, his nurse had emailed back before he did and stated that she should contact our office and I was out of the office and my partner went ahead and dropped the midodrine in the morning to 1 pill instead of the 2 pills.  He did, however, stated that he would defer to Dr. Graciela Husbands, and their office did get  back to them with the recommendations from Dr. Graciela Husbands.  She does report that she decreased the midodrine to 1 tablet 3 times per day.  She brings in BP logs.  She reports it was 63/34 today while standing.  Most of them while seated are in the 140's and 150's and she had one that was very high at 199.  Most of them standing are in the 1teens to low 100's but she has some higher.  She isn't feeling lightheaded or dizzy but does feel weak.  Her biggest c/o is abdominal pain.  She hasn't seen Dr. Leone Payor in a long time.  She has had diarrhea esp this weekend when her family came in and they cooked for her husbands bday.   Current prescribed movement disorder medications:  Carbidopa/levodopa 25/100, 2 tablets 6 times per day (6:30am, 9:30am, 12:30pm, 3:30, 6:30, 10:30pm)  Midodrine, 5 mg, she is taking 1 tablet 3 times per day   Prior medications: Mirtazapine, 7.5 mg at bedtime (I started her on that for sleep and to help weight gain, but her sleep issues were really from nocturia and she stopped it); Myrbetriq; Mestinon given for blood pressure by cardiology but never took it  ALLERGIES:   Allergies  Allergen Reactions   Oxycodone Other (See Comments)    CURRENT MEDICATIONS:  Outpatient Encounter Medications as of 12/09/2022  Medication Sig   aspirin EC 81 MG tablet Take 81  mg by mouth daily. Swallow whole.   carbidopa-levodopa (SINEMET CR) 50-200 MG tablet Take 1 tablet by mouth at bedtime.   dicyclomine (BENTYL) 10 MG capsule TAKE 1 CAPSULE BY MOUTH EVERY 6 HOURS AS NEEDED   melatonin 5 MG TABS Take 5 mg by mouth at bedtime.   midodrine (PROAMATINE) 5 MG tablet TAKE 1 TABLET BY MOUTH AT 7AM, 1 TABLET AT 11AM, 1 TABLET AT 3PM   polyethylene glycol powder (GLYCOLAX/MIRALAX) 17 GM/SCOOP powder Take 17 g by mouth daily.   Probiotic Product (PROBIOTIC PO) Take 1 capsule by mouth daily as needed.   Propylene Glycol (SYSTANE COMPLETE OP) Apply 1 drop to eye daily.   Simethicone (GAS-X PO) Take 1  tablet by mouth as needed. After meals and at bedtime   VITAMIN E PO Take 1 capsule by mouth daily.   [DISCONTINUED] carbidopa-levodopa (SINEMET IR) 25-100 MG tablet TAKE 2 TABLETS AT 6AM/9AM/NOON/3PM/6PM/9PM   carbidopa-levodopa (SINEMET IR) 25-100 MG tablet 2 at 6:30am, 9:30am, 12:30pm, 3:30, 6:30   [DISCONTINUED] pyridostigmine (MESTINON) 60 MG tablet Take 0.5 tablets (30 mg total) by mouth 2 (two) times daily. (Patient not taking: Reported on 06/22/2022)   No facility-administered encounter medications on file as of 12/09/2022.    Objective:   PHYSICAL EXAMINATION:    VITALS:   Vitals:   12/09/22 1529  BP: 132/74  Pulse: 69  Resp: 20  SpO2: 98%  Weight: 105 lb (47.6 kg)  Height: 5\' 2"  (1.575 m)     No data found.    GEN:  The patient appears stated age and is in NAD. HEENT:  Normocephalic, atraumatic.  The mucous membranes are moist. The superficial temporal arteries are without ropiness or tenderness.   Neurological examination:  Orientation: The patient is alert and oriented x3. Cranial nerves: There is good facial symmetry with mild facial hypomimia. The speech is fluent and clear. Soft palate rises symmetrically and there is no tongue deviation. Hearing is intact to conversational tone. Sensation: Sensation is intact to light touch throughout Motor: Strength is at least antigravity x4.    Movement examination: Tone: There is nl tone in the ue/le Abnormal movements: there is mild L leg dyskinesia Coordination:  There is mild decremation on the L Gait and Station: The patient arises well.  No significant shuffling but she has decreased arm swing and is a bit ataxic with ambulation  I have reviewed and interpreted the following labs independently    Chemistry      Component Value Date/Time   NA 134 (L) 05/29/2021 1514   NA 141 01/03/2017 1515   K 4.1 05/29/2021 1514   CL 99 05/29/2021 1514   CO2 29 05/29/2021 1514   BUN 14 05/29/2021 1514   BUN 13  01/03/2017 1515   CREATININE 0.73 05/29/2021 1514   CREATININE 0.78 12/26/2019 1454      Component Value Date/Time   CALCIUM 9.9 05/29/2021 1514   ALKPHOS 89 05/29/2021 1514   AST 11 (L) 05/29/2021 1514   ALT <5 05/29/2021 1514   BILITOT 0.7 05/29/2021 1514       Lab Results  Component Value Date   WBC 6.0 02/19/2022   HGB 11.8 02/19/2022   HCT 34.1 02/19/2022   MCV 98 (H) 02/19/2022   PLT 173 02/19/2022    Lab Results  Component Value Date   TSH 1.22 04/28/2021     Total time spent on today's visit was 38 minutes, including both face-to-face time and nonface-to-face time.  Time included  that spent on review of records (prior notes available to me/labs/imaging if pertinent), discussing treatment and goals, answering patient's questions and coordinating care.  Cc:  Kristian Covey, MD

## 2022-12-09 ENCOUNTER — Encounter: Payer: Self-pay | Admitting: Neurology

## 2022-12-09 ENCOUNTER — Ambulatory Visit (INDEPENDENT_AMBULATORY_CARE_PROVIDER_SITE_OTHER): Payer: Medicare HMO | Admitting: Neurology

## 2022-12-09 VITALS — BP 132/74 | HR 69 | Resp 20 | Ht 62.0 in | Wt 105.0 lb

## 2022-12-09 DIAGNOSIS — G20B1 Parkinson's disease with dyskinesia, without mention of fluctuations: Secondary | ICD-10-CM

## 2022-12-09 DIAGNOSIS — G903 Multi-system degeneration of the autonomic nervous system: Secondary | ICD-10-CM

## 2022-12-09 DIAGNOSIS — G20A1 Parkinson's disease without dyskinesia, without mention of fluctuations: Secondary | ICD-10-CM

## 2022-12-09 MED ORDER — CARBIDOPA-LEVODOPA 25-100 MG PO TABS: ORAL_TABLET | ORAL | Status: DC

## 2022-12-09 MED ORDER — CARBIDOPA-LEVODOPA ER 50-200 MG PO TBCR: 1.0000 | EXTENDED_RELEASE_TABLET | Freq: Every day | ORAL | 1 refills | Status: DC

## 2022-12-09 NOTE — Patient Instructions (Addendum)
Take carbidopa/levodopa 25/100, 2 at 6:30am, 9:30am, 12:30pm, 3:30, 6:30 STOP carbidopa/levodopa 25/100 at bed START carbidopa/levodopa 50/200 CR at bed  SAVE THE DATE!  We are planning a Parkinsons Disease educational symposium at [facility] in [place] on October 11.  More details to come!  If you would like to be added to our email list to get further information, email [email or web address].  We hope to see you there!

## 2023-01-22 ENCOUNTER — Other Ambulatory Visit: Payer: Self-pay | Admitting: Neurology

## 2023-01-22 DIAGNOSIS — G20A1 Parkinson's disease without dyskinesia, without mention of fluctuations: Secondary | ICD-10-CM

## 2023-01-26 ENCOUNTER — Ambulatory Visit (INDEPENDENT_AMBULATORY_CARE_PROVIDER_SITE_OTHER): Payer: Medicare HMO

## 2023-01-26 DIAGNOSIS — I428 Other cardiomyopathies: Secondary | ICD-10-CM | POA: Diagnosis not present

## 2023-01-28 LAB — CUP PACEART REMOTE DEVICE CHECK
Battery Impedance: 2274 Ohm
Battery Remaining Longevity: 30 mo
Battery Voltage: 2.74 V
Brady Statistic AP VP Percent: 16 %
Brady Statistic AP VS Percent: 0 %
Brady Statistic AS VP Percent: 83 %
Brady Statistic AS VS Percent: 0 %
Date Time Interrogation Session: 20240905194611
Implantable Lead Connection Status: 753985
Implantable Lead Connection Status: 753985
Implantable Lead Implant Date: 20131014
Implantable Lead Implant Date: 20131014
Implantable Lead Location: 753859
Implantable Lead Location: 753860
Implantable Lead Model: 5076
Implantable Lead Model: 5076
Implantable Pulse Generator Implant Date: 20131014
Lead Channel Impedance Value: 415 Ohm
Lead Channel Impedance Value: 720 Ohm
Lead Channel Pacing Threshold Amplitude: 0.5 V
Lead Channel Pacing Threshold Amplitude: 0.625 V
Lead Channel Pacing Threshold Pulse Width: 0.4 ms
Lead Channel Pacing Threshold Pulse Width: 0.4 ms
Lead Channel Setting Pacing Amplitude: 2 V
Lead Channel Setting Pacing Amplitude: 2.5 V
Lead Channel Setting Pacing Pulse Width: 0.4 ms
Lead Channel Setting Sensing Sensitivity: 4 mV
Zone Setting Status: 755011
Zone Setting Status: 755011

## 2023-02-08 NOTE — Progress Notes (Signed)
Remote pacemaker transmission.   

## 2023-02-14 ENCOUNTER — Other Ambulatory Visit: Payer: Self-pay | Admitting: Neurology

## 2023-02-14 DIAGNOSIS — G903 Multi-system degeneration of the autonomic nervous system: Secondary | ICD-10-CM

## 2023-02-16 ENCOUNTER — Other Ambulatory Visit: Payer: Self-pay | Admitting: Neurology

## 2023-02-16 DIAGNOSIS — G20A1 Parkinson's disease without dyskinesia, without mention of fluctuations: Secondary | ICD-10-CM

## 2023-03-22 ENCOUNTER — Ambulatory Visit: Payer: Medicare HMO | Attending: Internal Medicine | Admitting: Internal Medicine

## 2023-03-22 ENCOUNTER — Encounter: Payer: Self-pay | Admitting: Internal Medicine

## 2023-03-22 DIAGNOSIS — Z950 Presence of cardiac pacemaker: Secondary | ICD-10-CM

## 2023-03-22 DIAGNOSIS — G903 Multi-system degeneration of the autonomic nervous system: Secondary | ICD-10-CM

## 2023-03-22 DIAGNOSIS — I428 Other cardiomyopathies: Secondary | ICD-10-CM | POA: Diagnosis not present

## 2023-03-22 DIAGNOSIS — I442 Atrioventricular block, complete: Secondary | ICD-10-CM

## 2023-03-22 NOTE — Progress Notes (Signed)
Patient Care Team: Kristian Covey, MD as PCP - General (Family Medicine) Duke Salvia, MD as PCP - Cardiology (Cardiology) Duke Salvia, MD as PCP - Electrophysiology (Cardiology) Yolonda Kida, MD as Referring Physician (Neurology) Tat, Octaviano Batty, DO as Consulting Physician (Neurology) Verner Chol, [facility] (Inactive) as Pharmacist (Pharmacist)   HPI  Lindsey Bryant is a 82 y.o. 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     Major issues include Parkinson's as an affecting her GI tract.  And significant orthostatic lightheadedness.  And occasional fall.  Not inclined towards compression and concerned about the impact of ProAmatine given her systolic hypertension  Parkinson's is relatively stable with the medications from Dr. Arbutus Leas.      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   Parkinson's disease     Past Surgical History:  Procedure Laterality Date   APPENDECTOMY  1952   COLONOSCOPY     EYE SURGERY  07/2017   Cataract Sx   LEFT HEART CATH AND CORONARY ANGIOGRAPHY N/A 08/03/2016   Procedure: Left Heart Cath and Coronary Angiography;  Surgeon: Marykay Lex, MD;  Location: [facility] INVASIVE CV LAB;  Service: Cardiovascular;  Laterality: N/A;   MELANOMA EXCISION Right 2000   right arm   PACEMAKER PLACEMENT  05/2012   PACEMAKER REVISION N/A 03/14/2012   Procedure: PACEMAKER  REVISION;  Surgeon: Hillis Range, MD;  Location: [facility] CATH LAB;  Service: Cardiovascular;  Laterality: N/A;   PERMANENT PACEMAKER INSERTION N/A 03/06/2012   Procedure: PERMANENT PACEMAKER INSERTION;  Surgeon: Duke Salvia, MD;  Location: [facility] CATH LAB;  Service: Cardiovascular;  Laterality: N/A;   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    Current Outpatient Medications  Medication Sig Dispense Refill   aspirin EC 81 MG tablet Take 81 mg by mouth daily. Swallow whole.     carbidopa-levodopa (SINEMET IR) 25-100 MG tablet TAKE 2 TABLETS AT 6AM/9AM/NOON/3PM/6PM 900 tablet 0   dicyclomine (BENTYL) 10 MG capsule TAKE 1 CAPSULE BY MOUTH EVERY 6 HOURS AS NEEDED 360 capsule 1   melatonin 5 MG TABS Take 5 mg by mouth at bedtime.     midodrine (PROAMATINE) 5 MG tablet TAKE 1 TABLET BY MOUTH AT 7AM, 1 TABLET AT 11AM, 1 TABLET AT 3PM 270 tablet 0   polyethylene glycol powder (GLYCOLAX/MIRALAX) 17 GM/SCOOP powder Take 17 g by mouth daily.     Probiotic Product (PROBIOTIC PO) Take 1 capsule by mouth daily as needed.     Propylene Glycol (SYSTANE COMPLETE OP) Apply 1 drop to eye daily.     Simethicone (GAS-X PO) Take  1 tablet by mouth as needed. After meals and at bedtime     VITAMIN E PO Take 1 capsule by mouth daily.     No current facility-administered medications for this visit.    Allergies  Allergen Reactions   Oxycodone Other (See Comments)    Review of Systems negative except from HPI and PMH  Physical Exam BP (!) 153/78 (Patient Position: Sitting)   Pulse 81   Ht 5\' 2"  (1.575 m)   Wt 108 lb 3.2 oz (49.1 kg)   SpO2 98%   BMI 19.79 kg/m  Well developed and well nourished in no acute distress HENT normal Neck supple with JVP-flat Clear Device pocket well healed; without hematoma or erythema.  There is no tethering  Regular rate and rhythm, n murmur Abd-soft with active BS No Clubbing cyanosis  edema Skin-warm and dry A & Oriented  Grossly normal sensory and motor  function  ECG sinus @ 73 P-synchronous/ AV  pacing   Device function is normal. Programming changes none  See Paceart for details     Assessment and  Plan  Complete heart block  Narrow escape  NICM  Post prandial Fatigue/Parkinson's   Pacemaker Medtronic   Orthostatic Hypotension  Hypertension-labile-quiescient  Orthostasis remains her dominant cardiac issue.  Her pacemaker function is normal.  We discussed strategies, she is disinclined towards compression.  The bed is raised already 4 to 6 inches.  She is not exercising to speak of.  We discussed water aerobics and exercise bike and CUBII.  They are disinclined towards more ProAmatine because of resting systolic hypertension both in the seated as well as supine position. She was also not inclined in the past taking pyridostigmine  We will see her again in 1 years time

## 2023-03-22 NOTE — Patient Instructions (Signed)
Medication Instructions:  Your physician recommends that you continue on your current medications as directed. Please refer to the Current Medication list given to you today.  *If you need a refill on your cardiac medications before your next appointment, please call your pharmacy*   Lab Work: None ordered.  If you have labs (blood work) drawn today and your tests are completely normal, you will receive your results only by: MyChart Message (if you have MyChart) OR A paper copy in the mail If you have any lab test that is abnormal or we need to change your treatment, we will call you to review the results.   Testing/Procedures: None ordered.    Follow-Up: At [facility], you and your health needs are our priority.  As part of our continuing mission to provide you with exceptional heart care, we have created designated Provider Care Teams.  These Care Teams include your primary Cardiologist (physician) and Advanced Practice Providers (APPs -  Physician Assistants and Nurse Practitioners) who all work together to provide you with the care you need, when you need it.  We recommend signing up for the patient portal called "MyChart".  Sign up information is provided on this After Visit Summary.  MyChart is used to connect with patients for Virtual Visits (Telemedicine).  Patients are able to view lab/test results, encounter notes, upcoming appointments, etc.  Non-urgent messages can be sent to your provider as well.   To learn more about what you can do with MyChart, go to [email or web address].    Your next appointment:   12 months

## 2023-04-25 ENCOUNTER — Encounter: Payer: Self-pay | Admitting: Family Medicine

## 2023-04-25 ENCOUNTER — Ambulatory Visit: Payer: Medicare HMO | Admitting: Family Medicine

## 2023-04-25 VITALS — BP 115/60 | HR 66 | Temp 98.1°F | Ht 62.21 in | Wt 111.5 lb

## 2023-04-25 DIAGNOSIS — I252 Old myocardial infarction: Secondary | ICD-10-CM

## 2023-04-25 DIAGNOSIS — Z0000 Encounter for general adult medical examination without abnormal findings: Secondary | ICD-10-CM | POA: Diagnosis not present

## 2023-04-25 DIAGNOSIS — D539 Nutritional anemia, unspecified: Secondary | ICD-10-CM

## 2023-04-25 DIAGNOSIS — R7989 Other specified abnormal findings of blood chemistry: Secondary | ICD-10-CM

## 2023-04-25 LAB — CBC WITH DIFFERENTIAL/PLATELET
Basophils Absolute: 0 10*3/uL (ref 0.0–0.1)
Basophils Relative: 0.7 % (ref 0.0–3.0)
Eosinophils Absolute: 0.1 10*3/uL (ref 0.0–0.7)
Eosinophils Relative: 1.6 % (ref 0.0–5.0)
HCT: 37.8 % (ref 36.0–46.0)
Hemoglobin: 12.6 g/dL (ref 12.0–15.0)
Lymphocytes Relative: 28.2 % (ref 12.0–46.0)
Lymphs Abs: 1.6 10*3/uL (ref 0.7–4.0)
MCHC: 33.5 g/dL (ref 30.0–36.0)
MCV: 100.7 fL — ABNORMAL HIGH (ref 78.0–100.0)
Monocytes Absolute: 0.5 10*3/uL (ref 0.1–1.0)
Monocytes Relative: 9.3 % (ref 3.0–12.0)
Neutro Abs: 3.5 10*3/uL (ref 1.4–7.7)
Neutrophils Relative %: 60.2 % (ref 43.0–77.0)
Platelets: 189 10*3/uL (ref 150.0–400.0)
RBC: 3.75 Mil/uL — ABNORMAL LOW (ref 3.87–5.11)
RDW: 12.8 % (ref 11.5–15.5)
WBC: 5.7 10*3/uL (ref 4.0–10.5)

## 2023-04-25 LAB — LIPID PANEL
Cholesterol: 202 mg/dL — ABNORMAL HIGH (ref 0–200)
HDL: 68.1 mg/dL
LDL Cholesterol: 113 mg/dL — ABNORMAL HIGH (ref 0–99)
NonHDL: 134.01
Total CHOL/HDL Ratio: 3
Triglycerides: 107 mg/dL (ref 0.0–149.0)
VLDL: 21.4 mg/dL (ref 0.0–40.0)

## 2023-04-25 LAB — BASIC METABOLIC PANEL WITH GFR
BUN: 15 mg/dL (ref 6–23)
CO2: 31 meq/L (ref 19–32)
Calcium: 9.6 mg/dL (ref 8.4–10.5)
Chloride: 100 meq/L (ref 96–112)
Creatinine, Ser: 0.7 mg/dL (ref 0.40–1.20)
GFR: 80.94 mL/min
Glucose, Bld: 88 mg/dL (ref 70–99)
Potassium: 4.2 meq/L (ref 3.5–5.1)
Sodium: 137 meq/L (ref 135–145)

## 2023-04-25 LAB — HEPATIC FUNCTION PANEL
ALT: 2 U/L (ref 0–35)
AST: 10 U/L (ref 0–37)
Albumin: 4.5 g/dL (ref 3.5–5.2)
Alkaline Phosphatase: 65 U/L (ref 39–117)
Bilirubin, Direct: 0.1 mg/dL (ref 0.0–0.3)
Total Bilirubin: 0.6 mg/dL (ref 0.2–1.2)
Total Protein: 7 g/dL (ref 6.0–8.3)

## 2023-04-25 LAB — TSH: TSH: 1.74 u[IU]/mL (ref 0.35–5.50)

## 2023-04-25 MED ORDER — DICYCLOMINE HCL 10 MG PO CAPS: 10.0000 mg | ORAL_CAPSULE | Freq: Three times a day (TID) | ORAL | 1 refills | Status: DC | PRN

## 2023-04-25 NOTE — Progress Notes (Signed)
Established Patient Office Visit  Subjective   Patient ID: Lindsey Bryant, female    DOB: 25-Mar-1941  Age: 82 y.o. MRN: [id number]  Chief Complaint  Patient presents with   Annual Exam    HPI   Lindsey Bryant is seen today for physical exam.  She has history of complete heart block with pacemaker, history of non-ST elevation MI in 2018, IBS, Parkinson disease, history of melanoma, history of mild macrocytic anemia.  Generally doing well.  She recently saw cardiologist.  She has resting seated hypertension but standing history of orthostasis.  She takes protamine 3-4 times daily.  Denies any recent falls.  She ambulates intermittently with walker at home.  Health maintenance reviewed  -Tetanus is overdue -Flu vaccine already given -Pneumonia vaccines complete -Shingrix completed -No history of RSV vaccine -She has decided against further mammography and is aged out of further colonoscopy  Social history-she is married.  Has 2 daughters.  No grandchildren.  Smoked briefly in college for about 4 years.  .  No regular alcohol.   Family history-mother reportedly had MI age 50.  She had a sister with breast cancer.  Brother with type 2 diabetes.  Her father had history of alcoholism  Past Medical History:  Diagnosis Date   Acute systolic heart failure (HCC)    Arm fracture    Bradycardia    a. 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   Parkinson's disease    Past Surgical History:  Procedure Laterality Date   APPENDECTOMY  1952   COLONOSCOPY     EYE SURGERY  07/2017   Cataract Sx   LEFT HEART CATH AND CORONARY ANGIOGRAPHY N/A 08/03/2016   Procedure: Left Heart Cath and Coronary Angiography;  Surgeon: Marykay Lex, MD;  Location: [facility] INVASIVE CV LAB;  Service: Cardiovascular;  Laterality: N/A;   MELANOMA EXCISION Right 2000   right arm    PACEMAKER PLACEMENT  05/2012   PACEMAKER REVISION N/A 03/14/2012   Procedure: PACEMAKER REVISION;  Surgeon: Hillis Range, MD;  Location: [facility] CATH LAB;  Service: Cardiovascular;  Laterality: N/A;   PERMANENT PACEMAKER INSERTION N/A 03/06/2012   Procedure: PERMANENT PACEMAKER INSERTION;  Surgeon: Duke Salvia, MD;  Location: [facility] CATH LAB;  Service: Cardiovascular;  Laterality: N/A;   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 She has never used smokeless tobacco. She reports that she does not drink alcohol and does not use drugs. family history includes Alcohol abuse in her father; Breast cancer in an other family member; Breast cancer (age of onset: 7) in her sister; Diabetes in her brother; Heart attack (age of onset: 69) in her mother; Hypothyroidism in her daughter and daughter. Allergies  Allergen Reactions   Oxycodone Other (See Comments)     Review of Systems  Constitutional:  Positive for malaise/fatigue. Negative for chills, fever and weight loss.  Eyes:  Negative for blurred vision.  Respiratory:  Negative for shortness of breath.   Cardiovascular:  Negative for chest pain.  Genitourinary:  Negative for dysuria.  Neurological:  Negative for dizziness, weakness and headaches.  Psychiatric/Behavioral:  Negative for depression.       Objective:     BP (!) 160/80 (BP Location: Left Arm, Patient Position: Sitting, Cuff Size: Normal)  Pulse 66   Temp 98.1 F (36.7 C) (Oral)   Ht 5' 2.21" (1.58 m)   Wt 111 lb 8 oz (50.6 kg)   SpO2 97%   BMI 20.26 kg/m  BP Readings from Last 3 Encounters:  04/25/23 115/60  03/22/23 (!) 153/78  12/09/22 132/74   Wt Readings from Last 3 Encounters:  04/25/23 111 lb 8 oz (50.6 kg)  03/22/23 108 lb 3.2 oz (49.1 kg)  12/09/22 105 lb (47.6 kg)      Physical Exam Vitals reviewed.  Constitutional:      General: She is not in acute distress.    Appearance: She is not ill-appearing.   Cardiovascular:     Rate and Rhythm: Normal rate and regular rhythm.  Pulmonary:     Effort: Pulmonary effort is normal.     Breath sounds: Normal breath sounds. No wheezing or rales.  Musculoskeletal:     Right lower leg: No edema.     Left lower leg: No edema.  Neurological:     Mental Status: She is alert.      No results found for any visits on 04/25/23.    The ASCVD Risk score (Arnett DK, et al., 2019) failed to calculate for the following reasons:   The 2019 ASCVD risk score is only valid for ages 28 to 52   The patient has a prior MI or stroke diagnosis    Assessment & Plan:   Physical exam.  She has chronic problems as above.  Does have history of non-ST elevation MI back in 2018.  Cath at that time did not show any critical stenosis.  She apparently has been intolerant of statins in the past.  Not sure if she had any discussions regarding possible PCSK9 inhibitors with cardiology..  We discussed the following health maintenance items  -Vaccines up-to-date with exception of RSV which she will consider at some point this year.  Flu vaccine already given -Aged out of further colonoscopies -She has decided not to get any further mammograms -Discussed fall prevention and especially importance of changing positions slowly -She has seated hypertension today but standing blood pressure 115/60 which is very typical for her. -Check follow-up labs.  Patient is requesting follow-up lipids   No follow-ups on file.    Evelena Peat, MD

## 2023-04-27 ENCOUNTER — Ambulatory Visit (INDEPENDENT_AMBULATORY_CARE_PROVIDER_SITE_OTHER): Payer: Medicare HMO

## 2023-04-27 DIAGNOSIS — I442 Atrioventricular block, complete: Secondary | ICD-10-CM

## 2023-04-29 LAB — CUP PACEART REMOTE DEVICE CHECK
Battery Impedance: 2272 Ohm
Battery Remaining Longevity: 31 mo
Battery Voltage: 2.73 V
Brady Statistic AP VP Percent: 12 %
Brady Statistic AP VS Percent: 0 %
Brady Statistic AS VP Percent: 88 %
Brady Statistic AS VS Percent: 0 %
Date Time Interrogation Session: 20241205104855
Implantable Lead Connection Status: 753985
Implantable Lead Connection Status: 753985
Implantable Lead Implant Date: 20131014
Implantable Lead Implant Date: 20131014
Implantable Lead Location: 753859
Implantable Lead Location: 753860
Implantable Lead Model: 5076
Implantable Lead Model: 5076
Implantable Pulse Generator Implant Date: 20131014
Lead Channel Impedance Value: 413 Ohm
Lead Channel Impedance Value: 710 Ohm
Lead Channel Pacing Threshold Amplitude: 0.5 V
Lead Channel Pacing Threshold Amplitude: 0.5 V
Lead Channel Pacing Threshold Pulse Width: 0.4 ms
Lead Channel Pacing Threshold Pulse Width: 0.4 ms
Lead Channel Setting Pacing Amplitude: 2 V
Lead Channel Setting Pacing Amplitude: 2.5 V
Lead Channel Setting Pacing Pulse Width: 0.4 ms
Lead Channel Setting Sensing Sensitivity: 4 mV
Zone Setting Status: 755011
Zone Setting Status: 755011

## 2023-05-02 ENCOUNTER — Telehealth: Payer: Self-pay | Admitting: Internal Medicine

## 2023-05-02 NOTE — Telephone Encounter (Signed)
Appointment has been set up for 05/26/2023 at 1:30pm. Husband Molly Maduro to informed her.

## 2023-05-02 NOTE — Telephone Encounter (Signed)
Husband says having worsening abdominal pain  Please schedule in nurse visit spot in january

## 2023-05-06 ENCOUNTER — Telehealth: Payer: Self-pay

## 2023-05-06 NOTE — Telephone Encounter (Signed)
*  Primary  Pharmacy Patient Advocate Encounter   Received notification from CoverMyMeds that prior authorization for Dicyclomine HCl 10MG  capsules  is required/requested.   Insurance verification completed.   The patient is insured through [facility] .   Per test claim: PA required; PA submitted to above mentioned insurance via CoverMyMeds Key/confirmation #/EOC [id number] Status is pending

## 2023-05-09 ENCOUNTER — Other Ambulatory Visit (HOSPITAL_COMMUNITY): Payer: Self-pay

## 2023-05-09 DIAGNOSIS — H04123 Dry eye syndrome of bilateral lacrimal glands: Secondary | ICD-10-CM | POA: Diagnosis not present

## 2023-05-09 NOTE — Telephone Encounter (Signed)
Pharmacy Patient Advocate Encounter  Received notification from [facility] that Prior Authorization for Dicyclomine HCl 10MG  capsules has been APPROVED from 05/06/23 to 05/24/23. Unable to obtain price due to refill too soon rejection, last fill date 04/25/23 next available fill date2/3/25   PA #/Case ID/Reference #:  [id number]

## 2023-05-10 ENCOUNTER — Ambulatory Visit (INDEPENDENT_AMBULATORY_CARE_PROVIDER_SITE_OTHER): Payer: Medicare HMO | Admitting: Family Medicine

## 2023-05-10 ENCOUNTER — Encounter: Payer: Self-pay | Admitting: Family Medicine

## 2023-05-10 VITALS — BP 118/76 | HR 67 | Temp 97.3°F | Wt 107.8 lb

## 2023-05-10 DIAGNOSIS — N390 Urinary tract infection, site not specified: Secondary | ICD-10-CM | POA: Diagnosis not present

## 2023-05-10 LAB — POCT URINALYSIS DIPSTICK
Bilirubin, UA: NEGATIVE
Glucose, UA: NEGATIVE
Nitrite, UA: NEGATIVE
Protein, UA: POSITIVE — AB
Spec Grav, UA: 1.015 (ref 1.010–1.025)
Urobilinogen, UA: NEGATIVE U/dL — AB
pH, UA: 6 (ref 5.0–8.0)

## 2023-05-10 MED ORDER — CIPROFLOXACIN HCL 500 MG PO TABS: 500.0000 mg | ORAL_TABLET | Freq: Two times a day (BID) | ORAL | 0 refills | Status: DC

## 2023-05-10 NOTE — Progress Notes (Signed)
   Subjective:    Patient ID: Lindsey Bryant, female    DOB: 07-26-1940, 82 y.o.   MRN: [id number]  HPI Here for 3 days of urgency and burning with urination. No fever or back pain.    Review of Systems  Constitutional: Negative.   Respiratory: Negative.    Cardiovascular: Negative.   Gastrointestinal: Negative.   Genitourinary:  Positive for dysuria, frequency and urgency. Negative for flank pain and hematuria.       Objective:   Physical Exam Constitutional:      Appearance: Normal appearance. She is not ill-appearing.  Cardiovascular:     Rate and Rhythm: Normal rate and regular rhythm.     Pulses: Normal pulses.     Heart sounds: Normal heart sounds.  Pulmonary:     Effort: Pulmonary effort is normal.     Breath sounds: Normal breath sounds.  Abdominal:     General: Abdomen is flat. Bowel sounds are normal. There is no distension.     Palpations: Abdomen is soft. There is no mass.     Tenderness: There is no abdominal tenderness. There is no right CVA tenderness, left CVA tenderness, guarding or rebound.     Hernia: No hernia is present.  Neurological:     Mental Status: She is alert.           Assessment & Plan:  UTI, treat with 7 days of Cipro. Culture the sample. She will drink plenty of water.  Gershon Crane, MD

## 2023-05-10 NOTE — Addendum Note (Signed)
Addended by: Carola Rhine on: 05/10/2023 04:46 PM   Modules accepted: Orders

## 2023-05-11 ENCOUNTER — Ambulatory Visit: Payer: Medicare HMO | Admitting: Family Medicine

## 2023-05-12 ENCOUNTER — Other Ambulatory Visit: Payer: Self-pay | Admitting: Neurology

## 2023-05-12 DIAGNOSIS — G20B1 Parkinson's disease with dyskinesia, without mention of fluctuations: Secondary | ICD-10-CM

## 2023-05-13 LAB — URINE CULTURE
MICRO NUMBER:: 15860696
SPECIMEN QUALITY:: ADEQUATE

## 2023-05-14 ENCOUNTER — Other Ambulatory Visit: Payer: Self-pay | Admitting: Neurology

## 2023-05-14 DIAGNOSIS — G903 Multi-system degeneration of the autonomic nervous system: Secondary | ICD-10-CM

## 2023-05-26 ENCOUNTER — Ambulatory Visit: Payer: Medicare HMO | Admitting: Internal Medicine

## 2023-05-26 ENCOUNTER — Encounter: Payer: Self-pay | Admitting: Internal Medicine

## 2023-05-26 VITALS — BP 110/60 | HR 79 | Ht 62.0 in | Wt 108.0 lb

## 2023-05-26 DIAGNOSIS — R1032 Left lower quadrant pain: Secondary | ICD-10-CM

## 2023-05-26 DIAGNOSIS — K5900 Constipation, unspecified: Secondary | ICD-10-CM

## 2023-05-26 MED ORDER — BISACODYL 5 MG PO TBEC: 5.0000 mg | DELAYED_RELEASE_TABLET | Freq: Every day | ORAL | Status: DC | PRN

## 2023-05-26 NOTE — Patient Instructions (Signed)
 Take 2 doses of Miralax  every AM and if you go a day without a bowel movement then take one Dulcolax 5mg  tablet.  _______________________________________________________  If your blood pressure at your visit was 140/90 or greater, please contact your primary care physician to follow up on this.  _______________________________________________________  If you are age 82 or older, your body mass index should be between 23-30. Your Body mass index is 19.75 kg/m. If this is out of the aforementioned range listed, please consider follow up with your Primary Care Provider.  If you are age 63 or younger, your body mass index should be between 19-25. Your Body mass index is 19.75 kg/m. If this is out of the aformentioned range listed, please consider follow up with your Primary Care Provider.   ________________________________________________________  The [facility] providers would like to encourage you to use MYCHART to communicate with providers for non-urgent requests or questions.  Due to long hold times on the telephone, sending your provider a message by [facility] may be a faster and more efficient way to get a response.  Please allow 48 business hours for a response.  Please remember that this is for non-urgent requests.  _______________________________________________________  I appreciate the opportunity to care for you. Lupita Commander, MD, [facility]

## 2023-05-26 NOTE — Progress Notes (Signed)
 Lindsey Bryant 83 y.o. 09/09/1940 [id number]  Assessment & Plan:   Encounter Diagnoses  Name Primary?   Constipation, unspecified constipation type Yes   LLQ pain     MiraLax  2/day in AM Every other day prn dulcolax  Follow-up as needed  Subjective:   Chief Complaint: Constipation lower abdominal pain  HPI 83 year-old white woman with a history of Parkinson's disease and IBS-like problems and chronic constipation issues who has had recurrent lower abdominal and pelvic pain also.  Husband is here and participates in the history.  She is better than she has been in the past but still has problems where defecation is infrequent and she may go 3 or 4 days without a bowel movement.  As she goes without defecation she will get an ache and a pressure in the left lower quadrant that does improve after defecation.  Dicyclomine  is taken intermittently and helps.  Blood pressures have been fluctuating up and down some.  She has follow-up with Dr. Evonnie who has been helping with this.  She is having overactive bladder issues and is doing Kegel exercises with some benefit.  She is taking MiraLAX  daily.  She has not been using laxatives necessarily but wonders if she could use 1 at times.    CT abd/pelvis 03/04/20 IMPRESSION: 1. No acute findings are noted in the abdomen or pelvis to account for the patient's symptoms. 2. Aortic atherosclerosis. 3. Additional incidental findings, as above.     CT-E 08/01/16 IMPRESSION: 1. Proximal small bowel has become more distended in the interval measuring 3.8 cm in diameter. Interloop mesenteric fluid persists but does appear to have decreased in the interval. There is more intraperitoneal free fluid at this time. The distal small bowel wall thickening seen on the previous study also appears improved. 2. On today's exam, 2 discrete small bowel transitions zones are identified in the right peritoneal cavity. Given the presence of 2 transition zones,  closed loop obstruction and internal hernia remain distinct considerations. 3. Interval development of small bilateral pleural effusions and bibasilar atelectasis.   Colonoscopy 09/13/2013 ENDOSCOPIC IMPRESSION: 1. Diminutive polyp was found in the rectum; polypectomy was performed with a cold snare 2. The colon was otherwise normal     Allergies  Allergen Reactions   Oxycodone  Other (See Comments)   Current Meds  Medication Sig   aspirin  EC 81 MG tablet Take 81 mg by mouth daily. Swallow whole.   carbidopa -levodopa  (SINEMET  IR) 25-100 MG tablet TAKE 2 TABLETS AT 6AM/9AM/NOON/3PM/6PM (Patient taking differently: TAKE 2 TABLETS AT 6AM/9AM/NOON/3PM/6PM/9PM)   dicyclomine  (BENTYL ) 10 MG capsule Take 1 capsule (10 mg total) by mouth 3 (three) times daily between meals as needed for spasms.   melatonin 5 MG TABS Take 5 mg by mouth at bedtime.   midodrine  (PROAMATINE ) 5 MG tablet 1 tablet 3 times per day.  Take an extra in the AM if the SBP <110.   polyethylene glycol powder (GLYCOLAX /MIRALAX ) 17 GM/SCOOP powder Take 17 g by mouth daily.   Probiotic Product (PROBIOTIC PO) Take 1 capsule by mouth daily as needed.   Propylene Glycol (SYSTANE COMPLETE OP) Apply 1 drop to eye daily.   RESTASIS 0.05 % ophthalmic emulsion Place 1 drop into both eyes 2 (two) times daily.   Simethicone  (GAS-X PO) Take 1 tablet by mouth as needed. After meals and at bedtime   VITAMIN E PO Take 1 capsule by mouth daily.       Past Medical History:  Diagnosis Date  Acute systolic heart failure (HCC)    Arm fracture    Bradycardia    a. 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   Parkinson's disease    Past Surgical History:  Procedure Laterality Date   APPENDECTOMY  1952   COLONOSCOPY     EYE SURGERY  07/2017   Cataract Sx   LEFT HEART CATH AND CORONARY ANGIOGRAPHY  N/A 08/03/2016   Procedure: Left Heart Cath and Coronary Angiography;  Surgeon: Alm LELON Clay, MD;  Location: [facility] INVASIVE CV LAB;  Service: Cardiovascular;  Laterality: N/A;   MELANOMA EXCISION Right 2000   right arm   PACEMAKER PLACEMENT  05/2012   PACEMAKER REVISION N/A 03/14/2012   Procedure: PACEMAKER REVISION;  Surgeon: Lynwood Rakers, MD;  Location: [facility] CATH LAB;  Service: Cardiovascular;  Laterality: N/A;   PERMANENT PACEMAKER INSERTION N/A 03/06/2012   Procedure: PERMANENT PACEMAKER INSERTION;  Surgeon: Elspeth JAYSON Sage, MD;  Location: [facility] CATH LAB;  Service: Cardiovascular;  Laterality: N/A;   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 2 daughters I think.   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      Right Handed   family history includes Alcohol abuse in her father; Breast cancer in an other family member; Breast cancer (age of onset: 63) in her sister; Diabetes in her brother; Heart attack (age of onset: 83) in her mother; Hypothyroidism in her daughter and daughter.   Review of Systems As per HPI  Objective:   Physical Exam BP 110/60 Comment: unable to obtain the other than a systolic pressure of 110  Pulse 79   Ht 5' 2 (1.575 m)   Wt 108 lb (49 kg)   BMI 19.75 kg/m   Elderly white woman with somewhat masked facies and parkinsonian features with no acute distress Abdomen is thin soft and nontender without organomegaly or mass.

## 2023-05-27 ENCOUNTER — Other Ambulatory Visit: Payer: Self-pay | Admitting: Family Medicine

## 2023-05-27 ENCOUNTER — Ambulatory Visit: Payer: Self-pay | Admitting: Family Medicine

## 2023-05-27 ENCOUNTER — Other Ambulatory Visit: Payer: Self-pay

## 2023-05-27 MED ORDER — CIPROFLOXACIN HCL 500 MG PO TABS: 500.0000 mg | ORAL_TABLET | Freq: Two times a day (BID) | ORAL | 0 refills | Status: AC

## 2023-05-27 NOTE — Telephone Encounter (Signed)
Call in Cipro 500 mg BID for 10 days  

## 2023-05-27 NOTE — Telephone Encounter (Signed)
  Chief Complaint: Lingering UTI symptoms Symptoms: painful and constant urination Pertinent Negatives: Patient denies fever, blood in urine Disposition: [] ED /[] Urgent Care (no appt availability in office) / [] Appointment(In office/virtual)/ []  [place] [facility]/ [] Home Care/ [] Refused Recommended Disposition /[] [place] [facility]/ [x]  Follow-up with PCP Additional Notes: Patient and husband called in stating patient was seen in office by Dr. Johnny on 05/10/23 and diagnosed with UTI. Patient prescriped Cipro  and has completed the entirety of the abx cycle and patient is still having symptoms that are returning. Patient states she feels like she constantly has to urinate. Patient is requesting Dr. Johnny to put in a refill request for the same abx as patient statesit helped a lot the first time. Advised patient to be seen in office to be re-evaluated, or to visit the nearest UC for a urinalysis. Patient is requesting the medication be refilled and doesn't want to be seen at this time because she is looking for relief of symptoms asap. Advised patient I would send high priority message to PCP to find out if refill option is available.   Educated patient on OTC medication to assist with urinary tract infection symptoms and to monitor for fever. Advised patient to visit nearest UC over the weekend if symptoms worsen.  Copied from CRM [id number]. Topic: Clinical - Red Word Triage >> May 27, 2023  2:31 PM Tiffany H wrote: Red Word that prompted transfer to Nurse Triage: Patient's husband Charlie called to advise that patient's UTI has returned. Patient is out of Cipro . Will need a refill. Patient saw Gastrointerology yesterday - please review notes from that office visit for additional guidance. Please assist with what patient can do for relief today. Patient's last visit with Dr. Micheal was 05/10/23. Reason for Disposition  [1] Taking antibiotic > 24 hours for UTI (urinary tract or bladder infection)  AND [2] fever persists (still has fever)  Answer Assessment - Initial Assessment Questions 1. MAIN SYMPTOM: What is the main symptom you are concerned about? (e.g., painful urination, urine frequency)     Achy and feels like I have to go to the bathroom nonstop 2. BETTER-SAME-WORSE: Are you getting better, staying the same, or getting worse compared to how you felt at your last visit to the doctor (most recent medical visit)?     About the same 3. PAIN: How bad is the pain?  (e.g., Scale 1-10; mild, moderate, or severe)   - MILD (1-3): complains slightly about urination hurting   - MODERATE (4-7): interferes with normal activities     - SEVERE (8-10): excruciating, unwilling or unable to urinate because of the pain      8 4. FEVER: Do you have a fever? If Yes, ask: What is it, how was it measured, and when did it start?     No 5. OTHER SYMPTOMS: Do you have any other symptoms? (e.g., blood in the urine, flank pain, vaginal discharge)     Frequent urination 6. DIAGNOSIS: When was the UTI diagnosed? By whom? Was it a kidney infection, bladder infection or both?     Diagnosed with bladder infection by doctor 7. ANTIBIOTIC: What antibiotic(s) are you taking? How many times per day?     Cipro  BID 8. ANTIBIOTIC - START DATE: When did you start taking the antibiotic?     17th  Protocols used: Urinary Tract Infection on Antibiotic Follow-up Call - [facility]

## 2023-05-27 NOTE — Telephone Encounter (Signed)
 Pt Rx sent to her pharmacy. Aware to pick up

## 2023-06-13 DIAGNOSIS — L821 Other seborrheic keratosis: Secondary | ICD-10-CM | POA: Diagnosis not present

## 2023-06-13 DIAGNOSIS — L814 Other melanin hyperpigmentation: Secondary | ICD-10-CM | POA: Diagnosis not present

## 2023-06-13 DIAGNOSIS — L578 Other skin changes due to chronic exposure to nonionizing radiation: Secondary | ICD-10-CM | POA: Diagnosis not present

## 2023-06-13 DIAGNOSIS — D229 Melanocytic nevi, unspecified: Secondary | ICD-10-CM | POA: Diagnosis not present

## 2023-06-20 NOTE — Progress Notes (Unsigned)
Assessment/Plan:   1.  Parkinsons Disease, sx's since 2006  -decrease carbidopa/levodopa 25/100, 2 po 5 times per day (just stopped the dose she was taking at bed)  -Continue carbidopa/levodopa 50/200 CR at bedtime  -follows with dermatology one time per year.  She is seeing dermatology specialists of [place].      2.  Neurogenic Orthostatic Hypotension  -Continue midodrine, 5 mg, 1 tablet 3 times per day.  Take an extra in the AM if the SBP <110.  She and I did discuss the concept of permissive hypertension.   3.  Abdominal pain with hx of IBS (constipation and diarrhea)  -follows with Dr. Leone Payor.  Encouraged her to make a f/u appt with him as she is having more trouble  -discussed anticholinergic s/e with bentyl.  She is only using occasionally.  4.  Few episodes MS change  -suspect due to Neurogenic Orthostatic Hypotension  5.  Hx of fall with pathologic fracture of the right humerus  -Patient was following to make sure pathologic fracture was not caused by metastatic disease/hematologic disease, but ultimately refused further work-up by oncology. 6.  Constipation  -Follows with Dr. Leone Payor  Subjective:   Lindsey Bryant was seen today in follow up for Parkinsons disease.  My previous records were reviewed prior to todays visit as well as outside records available to me.  Patient with spouse who supplements the history.  I slightly decreased her levodopa last visit, just because she was taking the last 1 at bedtime and did not need that.  I did add on extended release levodopa at bedtime to make up for that, however.  She is tolerating that well.  She was just recently treated last month for urinary tract infection with primary care for E. coli UTI.  Notes are reviewed.  She also saw Dr. Leone Payor for constipation.  Current prescribed movement disorder medications:  Carbidopa/levodopa 25/100, 2 tablets 5 times per day (decreased from 6 times per day, because she was taking the  last at bed) Carbidopa/levodopa 50/200 CR at bed  Midodrine, 5 mg, she is taking 1 tablet 3 times per day   Prior medications: Mirtazapine, 7.5 mg at bedtime (I started her on that for sleep and to help weight gain, but her sleep issues were really from nocturia and she stopped it); Myrbetriq; Mestinon given for blood pressure by cardiology but never took it  ALLERGIES:   Allergies  Allergen Reactions   Oxycodone Other (See Comments)    CURRENT MEDICATIONS:  Outpatient Encounter Medications as of 06/21/2023  Medication Sig   aspirin EC 81 MG tablet Take 81 mg by mouth daily. Swallow whole.   bisacodyl 5 MG EC tablet Take 1 tablet (5 mg total) by mouth daily as needed for moderate constipation.   carbidopa-levodopa (SINEMET IR) 25-100 MG tablet TAKE 2 TABLETS AT 6AM/9AM/NOON/3PM/6PM (Patient taking differently: TAKE 2 TABLETS AT 6AM/9AM/NOON/3PM/6PM/9PM)   dicyclomine (BENTYL) 10 MG capsule Take 1 capsule (10 mg total) by mouth 3 (three) times daily between meals as needed for spasms.   melatonin 5 MG TABS Take 5 mg by mouth at bedtime.   midodrine (PROAMATINE) 5 MG tablet 1 tablet 3 times per day.  Take an extra in the AM if the SBP <110.   polyethylene glycol powder (GLYCOLAX/MIRALAX) 17 GM/SCOOP powder Take 34 g by mouth daily.   Probiotic Product (PROBIOTIC PO) Take 1 capsule by mouth daily as needed.   Propylene Glycol (SYSTANE COMPLETE OP) Apply 1 drop to  eye daily.   RESTASIS 0.05 % ophthalmic emulsion Place 1 drop into both eyes 2 (two) times daily.   Simethicone (GAS-X PO) Take 1 tablet by mouth as needed. After meals and at bedtime   VITAMIN E PO Take 1 capsule by mouth daily.   No facility-administered encounter medications on file as of 06/21/2023.    Objective:   PHYSICAL EXAMINATION:    VITALS:   There were no vitals filed for this visit.    No data found.    GEN:  The patient appears stated age and is in NAD. HEENT:  Normocephalic, atraumatic.  The mucous  membranes are moist. The superficial temporal arteries are without ropiness or tenderness.   Neurological examination:  Orientation: The patient is alert and oriented x3. Cranial nerves: There is good facial symmetry with mild facial hypomimia. The speech is fluent and clear. Soft palate rises symmetrically and there is no tongue deviation. Hearing is intact to conversational tone. Sensation: Sensation is intact to light touch throughout Motor: Strength is at least antigravity x4.    Movement examination: Tone: There is nl tone in the ue/le Abnormal movements: there is mild L leg dyskinesia Coordination:  There is mild decremation on the L Gait and Station: The patient arises well.  No significant shuffling but she has decreased arm swing and is a bit ataxic with ambulation  I have reviewed and interpreted the following labs independently    Chemistry      Component Value Date/Time   NA 137 04/25/2023 1423   NA 141 01/03/2017 1515   K 4.2 04/25/2023 1423   CL 100 04/25/2023 1423   CO2 31 04/25/2023 1423   BUN 15 04/25/2023 1423   BUN 13 01/03/2017 1515   CREATININE 0.70 04/25/2023 1423   CREATININE 0.73 05/29/2021 1514   CREATININE 0.78 12/26/2019 1454      Component Value Date/Time   CALCIUM 9.6 04/25/2023 1423   ALKPHOS 65 04/25/2023 1423   AST 10 04/25/2023 1423   AST 11 (L) 05/29/2021 1514   ALT 2 04/25/2023 1423   ALT <5 05/29/2021 1514   BILITOT 0.6 04/25/2023 1423   BILITOT 0.7 05/29/2021 1514       Lab Results  Component Value Date   WBC 5.7 04/25/2023   HGB 12.6 04/25/2023   HCT 37.8 04/25/2023   MCV 100.7 (H) 04/25/2023   PLT 189.0 04/25/2023    Lab Results  Component Value Date   TSH 1.74 04/25/2023     Total time spent on today's visit was *** minutes, including both face-to-face time and nonface-to-face time.  Time included that spent on review of records (prior notes available to me/labs/imaging if pertinent), discussing treatment and goals,  answering patient's questions and coordinating care.  Cc:  Kristian Covey, MD

## 2023-06-21 ENCOUNTER — Ambulatory Visit (INDEPENDENT_AMBULATORY_CARE_PROVIDER_SITE_OTHER): Payer: Medicare HMO | Admitting: Neurology

## 2023-06-21 ENCOUNTER — Encounter: Payer: Self-pay | Admitting: Neurology

## 2023-06-21 VITALS — BP 124/60 | HR 71 | Ht 62.0 in | Wt 108.3 lb

## 2023-06-21 DIAGNOSIS — R3915 Urgency of urination: Secondary | ICD-10-CM | POA: Diagnosis not present

## 2023-06-21 DIAGNOSIS — G20B2 Parkinson's disease with dyskinesia, with fluctuations: Secondary | ICD-10-CM

## 2023-06-21 MED ORDER — CARBIDOPA-LEVODOPA ER 25-100 MG PO TBCR: 1.0000 | EXTENDED_RELEASE_TABLET | Freq: Every day | ORAL | 1 refills | Status: DC

## 2023-06-21 NOTE — Patient Instructions (Addendum)
decrease carbidopa/levodopa 25/100, 2 po 5 times per day ADD carbidopa/levodopa 25/100 CR at bedtime  Local and Online Resources for Power over Parkinson's Group?  January 2025 ?  LOCAL [place] PARKINSON'S GROUPS??  Power over Parkinson's Group:???  Upcoming Power over [employer] Meetings/Care Partner Support:? 2nd Wednesdays of the month at 2 pm:  January 8th, February 12th Contact Lynwood Dawley at [email or web address] or Amy Marriott at [email or web address] if interested in participating in this group?  ?  LOCAL EVENTS AND NEW OFFERINGS?  Dance Project Spring 2025:  January 14-May 20, Tuesdays 10-11 am.  All details on website: [email or web address] ACT FITNESS Chair Yoga classes "Train and Gain", Fridays 10 am, ACT [employer].  Contact Gina at [phone].   PWR! Moves [place] class!  Wednesdays at 10 am.  Please contact Lonia Blood, PT at [email or web address] if interested. Health visitor Classes offering at [employer]!? Tuesdays (Chair Yoga)  and Wednesdays (PWR! Moves)  1:00 pm.?? Contact Aldona Lento [phone] or [email or web address] Drumming for Parkinson's will be held on 2nd and 4th Mondays at 11:00 am.?? Located at the [place] of the [place] ([address] [place]. [place].) *Next class is January 13th.? Contact Albertina Parr at [email or web address] or [phone]?  [employer] Parkinson's Tai Chi Class, Mondays at 11 am.  Call [phone] for details  [facility] at [facility]- [address], [place] Wednesdays- 4:00 - 5:00 PM - specifically for Parkinson's Disease.  Free!  Contact Denny Peon, [place] - [phone] (clinic) or  [phone] (cell) or by email: [email or web address]   ?ONLINE EDUCATION AND SUPPORT?  Parkinson Foundation:? www.parkinson.org?  PD Health at Home continues:? Mindfulness Mondays,  Wellness Wednesdays, Fitness Fridays??  Upcoming Education:??  Empowerment through Movement, Wednesday, January 15th, 1-2 pm A Deep Dive into Deep Brain Stimulation (DBS), Wednesday, January 29th, 1-2 pm Expert Briefing:    Stay tuned Register for virtual education and expert briefings (webinars) at [employer].gl  Please check out their website to sign up for emails and see their full online offerings??  ?  [employer] Foundation:? www.michaeljfox.org??  Third Thursday Webinars:? On the third Thursday of every month at 12 p.m. ET, join our free live webinars to learn about various aspects of living with Parkinson's disease and our work to speed medical breakthroughs.?  Upcoming Webinar:? Managing the Hidden Symptoms:  Mood and Motivation Changes in Parkinson's.  Thursday, January 16th at 12 noon.  Check out additional information on their website to see their full online offerings?  ?  [employer]:? www.[email or web address].org?  Upcoming Webinar:   Stay tuned Series:? Living with Parkinson's Meetup.?? Third Thursdays each month, 3 pm?  Care Partner Monthly Meetup.? With Jillene Bucks [place].? First Tuesday of each month, 2 pm?  Check out additional information to Live Well Today on their website?  ?  Parkinson and Movement Disorders (PMD) Alliance:? www.pmdalliance.org?  NeuroLife Online:? Online Education Events?  Sign up for emails, which are sent weekly to give you updates on programming and online offerings?  ?  Parkinson's Association of the [place]:? www.parkinsonassociation.org?  Information on online support groups, education events, and online exercises including Yoga, Parkinson's exercises and more-LOTS of information on links to PD resources and online events?  Virtual Support Group through [employer] of the [place]-First Wednesday of each month at 2 pm   MOVEMENT AND EXERCISE OPPORTUNITIES?  PWR!  Moves [place] class has returned!  Wednesdays at 10 am.  Please contact Lonia Blood, PT at [email or web address] if interested.  Parkinson's Exercise Class offerings at [employer]. Tuesdays (Chair yoga) and Wednesdays (PWR! Moves)  1:00 pm.?  Contact Aldona Lento [phone] or [email or web address]  Parkinson's Wellness Recovery (PWR! Moves)? www.pwr4life.org?  Info on the PWR! Virtual Experience:? You will have access to our expertise?through self-assessment, guided plans that start with the PD-specific fundamentals, educational content, tips, Q&A with an expert, and a growing Engineering geologist of PD-specific pre-recorded and live exercise classes of varying types and intensity - both physical and cognitive! If that is not enough, we offer 1:1 wellness consultations (in-person or virtual) to personalize your PWR! Dance movement psychotherapist.??  Parkinson [employer] Fridays:??  As part of the PD Health @ Home program, this free video series focuses each week on one aspect of fitness designed to support people living with Parkinson's.? These weekly videos highlight the Parkinson Foundation fitness guidelines for people with Parkinson's disease.?  MenusLocal.com.br?  Dance for PD website is offering free, live-stream classes throughout the week, as well as links to [employer] of classes:? https://[email or web address]/?  Virtual dance and Pilates for Parkinson's classes: Click on the Community Tab> Parkinson's Movement Initiative Tab.? To register for classes and for more information, visit www.[email or web address].co.za and click the "community" tab.??  [employer] Parkinson's Cycling Classes??  [facility] [facility]:? Thursdays @ Noon-Live classes at [employer] ([employer] at [email or web address]?or [phone])?  [facility] [employer] [facility]: Classes Tuesday, Wednesday and Thursday (contact [place] at [email or web address] ?or  [phone])?  [employer]?  Varied levels of classes are offered Tuesdays and Thursdays at [facility].??  Stretching with Byrd Hesselbach weekly class is also offered for people with Parkinson's?  To observe a class or for more information, call [phone] or email Patricia Nettle at [email or web address]?    ADDITIONAL SUPPORT AND RESOURCES?  [employer]:  Chiropractor:? www.well-springsolutions.org/caregiver-education/caregiver-support-group.? You may also contact Loleta Chance at [facility] -spring.org or [phone].????  Well-Spring Navigator:? Just1Navigator program, a?free service to help individuals and families through the journey of determining care for older adults.? The "Navigator" is a Child psychotherapist, Sidney Ace, who will speak with a prospective client and/or loved ones to provide an assessment of the situation and a set of recommendations for a personalized care plan -- all free of charge, and whether?[employer] offers the needed service or not. If the need is not a service we provide, we are well-connected with reputable programs in town that we can refer you to.? www.well-springsolutions.org or to speak with the Navigator, call [phone].? '

## 2023-06-23 ENCOUNTER — Telehealth: Payer: Self-pay | Admitting: Family Medicine

## 2023-06-23 NOTE — Telephone Encounter (Signed)
Patient husband called in wanting to know why was patient medication denied the  ciprofloxacin  the medication was called by her neurologist he would like a call back regarding this matter

## 2023-06-24 ENCOUNTER — Other Ambulatory Visit: Payer: Self-pay | Admitting: Neurology

## 2023-06-24 DIAGNOSIS — G903 Multi-system degeneration of the autonomic nervous system: Secondary | ICD-10-CM

## 2023-06-24 MED ORDER — CIPROFLOXACIN HCL 0.3 % OP SOLN: OPHTHALMIC | 0 refills | Status: DC

## 2023-06-30 ENCOUNTER — Telehealth: Payer: Self-pay | Admitting: *Deleted

## 2023-06-30 NOTE — Telephone Encounter (Signed)
 Copied from CRM [id number]. Topic: Clinical - Medical Advice >> Jun 30, 2023  4:22 PM Franky GRADE wrote: Reason for CRM: Patient with symptoms of an overactive bladder she is requsting Mybetrig, advised patient that an appointment may be required but she states she has parkinsons disease and it's not safe for her to be constantly going to the rest room.

## 2023-07-01 NOTE — Telephone Encounter (Signed)
 Appt scheduled

## 2023-07-05 ENCOUNTER — Encounter: Payer: Self-pay | Admitting: Family Medicine

## 2023-07-05 ENCOUNTER — Telehealth: Payer: Medicare HMO | Admitting: Family Medicine

## 2023-07-05 VITALS — BP 126/77 | HR 67

## 2023-07-05 DIAGNOSIS — M25511 Pain in right shoulder: Secondary | ICD-10-CM

## 2023-07-05 DIAGNOSIS — N952 Postmenopausal atrophic vaginitis: Secondary | ICD-10-CM | POA: Diagnosis not present

## 2023-07-05 DIAGNOSIS — M25611 Stiffness of right shoulder, not elsewhere classified: Secondary | ICD-10-CM | POA: Diagnosis not present

## 2023-07-05 DIAGNOSIS — R350 Frequency of micturition: Secondary | ICD-10-CM

## 2023-07-05 MED ORDER — ESTRADIOL 0.1 MG/GM VA CREA: TOPICAL_CREAM | VAGINAL | 3 refills | Status: DC

## 2023-07-05 NOTE — Progress Notes (Signed)
Patient ID: TYMIA STREB, female   DOB: 12/08/1940, 83 y.o.   MRN: [id number]  Virtual Visit via Video Note  I connected with Lindsey Bryant on 07/05/23 at  1:45 PM EST by a video enabled telemedicine application and verified that I am speaking with the correct person using two identifiers.  Location patient: home Location provider:work or home office Persons participating in the virtual visit: patient, provider  I discussed the limitations of evaluation and management by telemedicine and the availability of in person appointments. The patient expressed understanding and agreed to proceed.   HPI: Lindsey Bryant has history of heart block, CAD, Parkinson's disease, past history of melanoma.  She presented in December with frequent urination.  Urine dipstick at that time suggested infection.  Subsequent culture grew out E. coli.  She was treated with antibiotics.  She then developed recurrent urinary frequency and apparently was treated with another good round of antibiotics empirically.  She still has intermittent episodes of urine urgency and frequency.  No recent burning with urination.  No flank pain.  No fevers or chills.  She does have history of some vaginal irritation.  No stress incontinence.  Does Kegel exercises.  Does have some urgency incontinence.  Other issue is restricted range of motion right shoulder.  She has had some pain especially with abduction.  Starting to limit activities more.  She had fracture couple years ago.  Feels like her range of motion has been declining since then.  She would like to consider possible physical therapy.   ROS: See pertinent positives and negatives per HPI.  Past Medical History:  Diagnosis Date   Acute systolic heart failure (HCC)    Arm fracture    Bradycardia    a. 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 INVASIVE CV LAB;  Service: Cardiovascular;  Laterality: N/A;   MELANOMA EXCISION Right 2000   right arm   PACEMAKER PLACEMENT  05/2012   PACEMAKER REVISION N/A 03/14/2012   Procedure: PACEMAKER REVISION;  Surgeon: Hillis Range, MD;  Location: [facility] CATH LAB;  Service: Cardiovascular;  Laterality: N/A;   PERMANENT PACEMAKER INSERTION N/A 03/06/2012   Procedure: PERMANENT PACEMAKER INSERTION;  Surgeon: Duke Salvia, MD;  Location: [facility] CATH LAB;  Service: Cardiovascular;  Laterality: N/A;   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  Lives with husband.   Current Outpatient  Medications:    aspirin EC 81 MG tablet, Take 81 mg by mouth daily. Swallow whole., Disp: , Rfl:    carbidopa-levodopa (SINEMET IR) 25-100 MG tablet, TAKE 2 TABLETS AT 6AM/9AM/NOON/3PM/6PM (Patient taking differently: TAKE 2 TABLETS AT 6AM/9AM/NOON/3PM/6PM/9PM), Disp: 900 tablet, Rfl: 0   Carbidopa-Levodopa ER (SINEMET CR) 25-100 MG tablet controlled release, Take 1 tablet by mouth at bedtime., Disp: 90 tablet, Rfl: 1   dicyclomine (BENTYL) 10 MG capsule, Take 1 capsule (10 mg total) by mouth 3 (three) times daily between meals as needed for spasms., Disp: 360 capsule, Rfl: 1   [START ON 07/06/2023]  estradiol (ESTRACE) 0.1 MG/GM vaginal cream, Apply 1 gram to vagina three time weekly, Disp: 42.5 g, Rfl: 3   melatonin 5 MG TABS, Take 5 mg by mouth at bedtime., Disp: , Rfl:    midodrine (PROAMATINE) 5 MG tablet, 1 TABLET 3 TIMES PER DAY. TAKE AN EXTRA TABLET IN THE MORNING IF THE SBP <110., Disp: 280 tablet, Rfl: 0   polyethylene glycol powder (GLYCOLAX/MIRALAX) 17 GM/SCOOP powder, Take 34 g by mouth daily., Disp: , Rfl:    Probiotic Product (PROBIOTIC PO), Take 1 capsule by mouth daily as needed., Disp: , Rfl:    Propylene Glycol (SYSTANE COMPLETE OP), Apply 1 drop to eye daily., Disp: , Rfl:    RESTASIS 0.05 % ophthalmic emulsion, Place 1 drop into both eyes 2 (two) times daily., Disp: , Rfl:    Simethicone (GAS-X PO), Take 1 tablet by mouth as needed. After meals and at bedtime, Disp: , Rfl:    VITAMIN E PO, Take 1 capsule by mouth daily., Disp: , Rfl:    bisacodyl 5 MG EC tablet, Take 1 tablet (5 mg total) by mouth daily as needed for moderate constipation., Disp: , Rfl:    ciprofloxacin (CILOXAN) 0.3 % ophthalmic solution, Administer 1 drop, every 2 hours, while awake, for 2 days. Then 1 drop, every 4 hours, while awake, for the next 5 days., Disp: 5 mL, Rfl: 0  EXAM:  VITALS per patient if applicable:  GENERAL: alert, oriented, appears well and in no acute distress  HEENT: atraumatic, conjunttiva clear, no obvious abnormalities on inspection of external nose and ears  NECK: normal movements of the head and neck  LUNGS: on inspection no signs of respiratory distress, breathing rate appears normal, no obvious gross SOB, gasping or wheezing  CV: no obvious cyanosis  MS: moves all visible extremities without noticeable abnormality  PSYCH/NEURO: pleasant and cooperative, no obvious depression or anxiety, speech and thought processing grossly intact  ASSESSMENT AND PLAN:  Discussed the following assessment and plan:  Atrophic vaginitis  Urinary frequency  Right shoulder pain,  unspecified chronicity  Decreased range of motion of right shoulder - Plan: Ambulatory referral to Physical Therapy -Patient has history of recent UTI.  Does have history of atrophic vaginitis.  We discussed possible use of Estrace vaginal cream 1 g per vagina 3 times weekly with prescription written.  No known contraindications. -She does have some urine urgency.  Try to avoid anticholinergics secondary to increased risk. -Set up physical therapy for working on range of motion right shoulder.    I discussed the assessment and treatment plan with the patient. The patient was provided an opportunity to ask questions and all were answered. The patient agreed with the plan and demonstrated an understanding of the instructions.   The patient was advised to call back or seek an in-person evaluation if the symptoms worsen or if the condition  fails to improve as anticipated.     Evelena Peat, MD

## 2023-07-18 DIAGNOSIS — H04123 Dry eye syndrome of bilateral lacrimal glands: Secondary | ICD-10-CM | POA: Diagnosis not present

## 2023-07-24 NOTE — Therapy (Signed)
 OUTPATIENT PHYSICAL THERAPY SHOULDER EVALUATION   Patient Name: Lindsey Bryant MRN: [id number] DOB:May 30, 1940, 83 y.o., female Today's Date: 07/25/2023  END OF SESSION:  PT End of Session - 07/25/23 1529     Visit Number 1    Date for PT Re-Evaluation 09/19/23    Authorization Type [employer] MCR    Progress Note Due on Visit 10    PT Start Time 1530    PT Stop Time 1609    PT Time Calculation (min) 39 min    Activity Tolerance Patient tolerated treatment well    Behavior During Therapy [facility] for tasks assessed/performed             Past Medical History:  Diagnosis Date   Acute systolic heart failure (HCC)    Arm fracture    Bradycardia    a. 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   Parkinson's disease    Past Surgical History:  Procedure Laterality Date   APPENDECTOMY  1952   COLONOSCOPY     EYE SURGERY  07/2017   Cataract Sx   LEFT HEART CATH AND CORONARY ANGIOGRAPHY N/A 08/03/2016   Procedure: Left Heart Cath and Coronary Angiography;  Surgeon: Marykay Lex, MD;  Location: [facility] INVASIVE CV LAB;  Service: Cardiovascular;  Laterality: N/A;   MELANOMA EXCISION Right 2000   right arm   PACEMAKER PLACEMENT  05/2012   PACEMAKER REVISION N/A 03/14/2012   Procedure: PACEMAKER REVISION;  Surgeon: Hillis Range, MD;  Location: [facility] CATH LAB;  Service: Cardiovascular;  Laterality: N/A;   PERMANENT PACEMAKER INSERTION N/A 03/06/2012   Procedure: PERMANENT PACEMAKER INSERTION;  Surgeon: Duke Salvia, MD;  Location: [facility] CATH LAB;  Service: Cardiovascular;  Laterality: N/A;   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   Patient Active Problem List   Diagnosis Date Noted   Macrocytic anemia 07/15/2021   Pathologic fracture of right humerus 05/31/2021   NICM (nonischemic cardiomyopathy) (HCC) 08/25/2020   Neurogenic orthostatic  hypotension (HCC) 01/01/2020   Obstructed internal hernia    Bilateral lower abdominal pain 07/29/2016   Elevated TSH 07/29/2016   NSTEMI (non-ST elevated myocardial infarction) (HCC)    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/2012   Complete heart block-narrow QRS escape 03/03/2012   MENOPAUSAL SYNDROME 09/17/2009   MELANOMA, 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   Parkinson's disease (HCC) 11/21/2006   Irritable bowel syndrome 11/21/2006    PCP: Kristian Covey, MD   REFERRING PROVIDER: Kristian Covey, MD   REFERRING DIAG: M25.611 (ICD-10-CM) - Decreased range of motion of right shoulder   THERAPY DIAG:  Stiffness of right shoulder, not elsewhere classified  Acute pain of right shoulder  Muscle weakness (generalized)  Cramp and spasm  Rationale for Evaluation and Treatment: Rehabilitation  ONSET DATE: one month ago  SUBJECTIVE:  SUBJECTIVE STATEMENT: Broke arm 2 years ago and about a month ago it I realized I couldn't lift it and it hurt.  Hand dominance: Right  PERTINENT HISTORY: R humeral fx 2022, pacemaker, PD  PAIN:  Are you having pain? Yes: NPRS scale: 5/10 Pain location: lateral R arm Pain description: pressure Aggravating factors: lifting OH and out to the side Relieving factors: resting position  PRECAUTIONS: ICD/Pacemaker and Other: patient reports orthostatic hypotension  RED FLAGS: None   WEIGHT BEARING RESTRICTIONS: No  FALLS:  Has patient fallen in last 6 months? No  LIVING ENVIRONMENT: Lives with: lives with their spouse  OCCUPATION: retired  PLOF: Independent  PATIENT GOALS:get some exercises to loosen up the shoulder  NEXT MD VISIT:   OBJECTIVE:  Note: Objective measures were completed at Evaluation unless otherwise noted.  DIAGNOSTIC FINDINGS:  HISTORY OF (05/09/21) Anterior  dislocation of the right shoulder with mildly displaced comminuted oblique fracture of the proximal right humerus.  PATIENT SURVEYS:  Quick Dash  20.5 / 100 = 20.5 %     COGNITION: Overall cognitive status: Within functional limits for tasks assessed     SENSATION: WFL  POSTURE: Mild winging B Rounded shoulders  UPPER EXTREMITY ROM:   A/P ROM Right eval Left eval  Shoulder flexion 114/130 115/165  Shoulder extension    Shoulder abduction 50*/58* 135/145  Shoulder adduction    Shoulder internal rotation 65/72 F 4 inch below L 64 and F WNL  Shoulder external rotation 45/56* 78  Elbow flexion WNL WNL  Elbow extension WNL WNL  Wrist flexion    Wrist extension    Wrist ulnar deviation    Wrist radial deviation    Wrist pronation    Wrist supination    (Blank rows = not tested) Key: WFL = within functional limits not formally assessed, * = concordant pain, s = stiffness/stretching sensation, NT = not tested)  UPPER EXTREMITY MMT:  MMT Right eval Left eval  Shoulder flexion 4* 4+  Shoulder extension 5 5  Shoulder abduction 2+* 4+  Shoulder adduction    Shoulder internal rotation 4 5  Shoulder external rotation 4-* 4  Middle trapezius    Lower trapezius    Elbow flexion    Elbow extension    Wrist flexion    Wrist extension    Wrist ulnar deviation    Wrist radial deviation    Wrist pronation    Wrist supination    Grip strength (lbs)    (Blank rows = not tested)     SHOULDER SPECIAL TESTS: Impingement tests: Hawkins/Kennedy impingement test: negative Rotator cuff assessment: Drop arm test: negative, Empty can test: negative, and Full can test: negative Biceps assessment: Speed's test: negative  JOINT MOBILITY TESTING:  Decreased post G/H Post mobility, decreased scapular mobility  PALPATION:  Palpation: TTP at R UT, ant shoulder, parascapular muscles med and laterally.  TREATMENT DATE:  07/24/23 See pt ed and HEP   PATIENT EDUCATION: Education details: PT eval findings, anticipated POC, initial HEP, postural awareness, and role of DN  Person educated: Patient Education method: Explanation, Demonstration, and Handouts Education comprehension: verbalized understanding and returned demonstration  HOME EXERCISE PROGRAM: Access Code: [email or web address] URL: [email or web address] Date: 07/25/2023 Prepared by: Raynelle Fanning  Exercises - Supine Shoulder Flexion Extension AAROM with Dowel  - 2-3 x daily - 7 x weekly - 1-3 sets - 10 reps - Seated Shoulder Abduction AAROM with Dowel (Mirrored)  - 2 x daily - 7 x weekly - 2-3 sets - 10 reps - 3-5 sec hold - Standing Bilateral Shoulder Internal Rotation AAROM with Dowel  - 2 x daily - 3 x weekly - 2-3 sets - 10 reps - Supine Shoulder External Rotation in 45 Degrees Abduction AAROM with Dowel (Mirrored)  - 2 x daily - 3 x weekly - 2-3 sets - 10 reps  ASSESSMENT:  CLINICAL IMPRESSION: Patient is a 83 y.o. female who was seen today for physical therapy evaluation and treatment for R shoulder stiffness and pain starting one month ago and affecting ADLs. She has previously had a right humeral fracture (2022) and did well with PT after this. She is limited in all planes of ROM except extension. She also demonstrates decreased strength and joint mobility. She will benefit from skilled PT to address these deficits.    OBJECTIVE IMPAIRMENTS: decreased activity tolerance, decreased ROM, decreased strength, hypomobility, increased muscle spasms, impaired flexibility, impaired UE functional use, postural dysfunction, and pain.   ACTIVITY LIMITATIONS: lifting, dressing, reach over head, and hygiene/grooming  PARTICIPATION LIMITATIONS:  anything OH  PERSONAL FACTORS: Age, Fitness, and 1-2 comorbidities: previous humeral fx and PD  are also affecting patient's functional outcome.    REHAB POTENTIAL: Good  CLINICAL DECISION MAKING: Evolving/moderate complexity  EVALUATION COMPLEXITY: Moderate   GOALS: Goals reviewed with patient? Yes  SHORT TERM GOALS: Target date: 08/22/2023   Patient will be independent with initial HEP.  Baseline:  Goal status: INITIAL  2.  Decreased pain by > = 25% with ADLs  Baseline:  Goal status: INITIAL  3.  Improved shoulder ABD to 90 deg or more  Baseline:  Goal status: INITIAL   LONG TERM GOALS: Target date: 09/19/2023   Patient will be independent with advanced/ongoing HEP to improve outcomes and carryover.  Baseline:  Goal status: INITIAL  2.  Patient will report 75% improvement in R shoulder pain to improve QOL.  Baseline:  Goal status: INITIAL  3.  Patient to improve R shoulder AROM to [facility] without pain provocation to allow for increased ease of ADLs.  Baseline:  Goal status: INITIAL  4.  Patient will demonstrate improved UE strength to 4+/5.  Baseline:  Goal status: INITIAL  5. Decreased Quick Dash to 10 points demonstrating improved functional ability.  Baseline: 20 Goal status: INITIAL   PLAN:  PT FREQUENCY: 2x/week  PT DURATION: 8 weeks  PLANNED INTERVENTIONS: 97164- PT Re-evaluation, 97110-Therapeutic exercises, 97530- Therapeutic activity, 97112- Neuromuscular re-education, 97535- Self Care, [id number]- Manual therapy, Patient/Family education, Taping, Dry Needling, Joint mobilization, Spinal mobilization, Cryotherapy, and Moist heat  PLAN FOR NEXT SESSION: Review and progress HEP, AA/ROM, strengthening, PROM, possible DN if indicated. No ionto/estim (pacemaker). Precaution: Patient reports orthostatic hypotension with supine to stand.    Solon Palm, PT  07/25/2023, 4:37 PM

## 2023-07-25 ENCOUNTER — Ambulatory Visit: Payer: Medicare HMO | Attending: Family Medicine | Admitting: Physical Therapy

## 2023-07-25 ENCOUNTER — Other Ambulatory Visit: Payer: Self-pay

## 2023-07-25 DIAGNOSIS — M25511 Pain in right shoulder: Secondary | ICD-10-CM | POA: Insufficient documentation

## 2023-07-25 DIAGNOSIS — R252 Cramp and spasm: Secondary | ICD-10-CM | POA: Diagnosis not present

## 2023-07-25 DIAGNOSIS — M25611 Stiffness of right shoulder, not elsewhere classified: Secondary | ICD-10-CM | POA: Insufficient documentation

## 2023-07-25 DIAGNOSIS — M6281 Muscle weakness (generalized): Secondary | ICD-10-CM | POA: Diagnosis not present

## 2023-07-27 ENCOUNTER — Ambulatory Visit: Payer: Medicare HMO

## 2023-07-27 DIAGNOSIS — I442 Atrioventricular block, complete: Secondary | ICD-10-CM

## 2023-07-30 LAB — CUP PACEART REMOTE DEVICE CHECK
Battery Impedance: 2425 Ohm
Battery Remaining Longevity: 28 mo
Battery Voltage: 2.73 V
Brady Statistic AP VP Percent: 14 %
Brady Statistic AP VS Percent: 0 %
Brady Statistic AS VP Percent: 86 %
Brady Statistic AS VS Percent: 0 %
Date Time Interrogation Session: 20250305125236
Implantable Lead Connection Status: 753985
Implantable Lead Connection Status: 753985
Implantable Lead Implant Date: 20131014
Implantable Lead Implant Date: 20131014
Implantable Lead Location: 753859
Implantable Lead Location: 753860
Implantable Lead Model: 5076
Implantable Lead Model: 5076
Implantable Pulse Generator Implant Date: 20131014
Lead Channel Impedance Value: 393 Ohm
Lead Channel Impedance Value: 671 Ohm
Lead Channel Pacing Threshold Amplitude: 0.5 V
Lead Channel Pacing Threshold Amplitude: 0.625 V
Lead Channel Pacing Threshold Pulse Width: 0.4 ms
Lead Channel Pacing Threshold Pulse Width: 0.4 ms
Lead Channel Setting Pacing Amplitude: 2 V
Lead Channel Setting Pacing Amplitude: 2.5 V
Lead Channel Setting Pacing Pulse Width: 0.4 ms
Lead Channel Setting Sensing Sensitivity: 4 mV
Zone Setting Status: 755011
Zone Setting Status: 755011

## 2023-08-09 ENCOUNTER — Ambulatory Visit: Admitting: Rehabilitative and Restorative Service Providers"

## 2023-08-09 ENCOUNTER — Encounter: Payer: Self-pay | Admitting: Rehabilitative and Restorative Service Providers"

## 2023-08-09 DIAGNOSIS — R252 Cramp and spasm: Secondary | ICD-10-CM | POA: Diagnosis not present

## 2023-08-09 DIAGNOSIS — M25611 Stiffness of right shoulder, not elsewhere classified: Secondary | ICD-10-CM | POA: Diagnosis not present

## 2023-08-09 DIAGNOSIS — M25511 Pain in right shoulder: Secondary | ICD-10-CM | POA: Diagnosis not present

## 2023-08-09 DIAGNOSIS — M6281 Muscle weakness (generalized): Secondary | ICD-10-CM

## 2023-08-09 NOTE — Therapy (Signed)
 OUTPATIENT PHYSICAL THERAPY TREATMENT NOTE   Patient Name: Lindsey Bryant MRN: [id number] DOB:March 17, 1941, 83 y.o., female Today's Date: 08/09/2023  END OF SESSION:  PT End of Session - 08/09/23 1448     Visit Number 2    Date for PT Re-Evaluation 09/19/23    Authorization Type [employer] MCR    Progress Note Due on Visit 10    PT Start Time 1445    PT Stop Time 1525    PT Time Calculation (min) 40 min    Activity Tolerance Patient tolerated treatment well    Behavior During Therapy [facility] for tasks assessed/performed             Past Medical History:  Diagnosis Date   Acute systolic heart failure (HCC)    Arm fracture    Bradycardia    a. 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   Parkinson's disease    Past Surgical History:  Procedure Laterality Date   APPENDECTOMY  1952   COLONOSCOPY     EYE SURGERY  07/2017   Cataract Sx   LEFT HEART CATH AND CORONARY ANGIOGRAPHY N/A 08/03/2016   Procedure: Left Heart Cath and Coronary Angiography;  Surgeon: Marykay Lex, MD;  Location[facility] INVASIVE CV LAB;  Service: Cardiovascular;  Laterality: N/A;   MELANOMA EXCISION Right 2000   right arm   PACEMAKER PLACEMENT  05/2012   PACEMAKER REVISION N/A 03/14/2012   Procedure: PACEMAKER REVISION;  Surgeon: Hillis Range, MD;  Location: [facility] CATH LAB;  Service: Cardiovascular;  Laterality: N/A;   PERMANENT PACEMAKER INSERTION N/A 03/06/2012   Procedure: PERMANENT PACEMAKER INSERTION;  Surgeon: Duke Salvia, MD;  Location: [facility] CATH LAB;  Service: Cardiovascular;  Laterality: N/A;   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   Patient Active Problem List   Diagnosis Date Noted   Macrocytic anemia 07/15/2021   Pathologic fracture of right humerus 05/31/2021   NICM (nonischemic cardiomyopathy) (HCC) 08/25/2020   Neurogenic orthostatic  hypotension (HCC) 01/01/2020   Obstructed internal hernia    Bilateral lower abdominal pain 07/29/2016   Elevated TSH 07/29/2016   NSTEMI (non-ST elevated myocardial infarction) (HCC)    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/2012   Complete heart block-narrow QRS escape 03/03/2012   MENOPAUSAL SYNDROME 09/17/2009   MELANOMA, 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   Parkinson's disease (HCC) 11/21/2006   Irritable bowel syndrome 11/21/2006    PCP: Kristian Covey, MD   REFERRING PROVIDER: Kristian Covey, MD   REFERRING DIAG: M25.611 (ICD-10-CM) - Decreased range of motion of right shoulder   THERAPY DIAG:  Stiffness of right shoulder, not elsewhere classified  Acute pain of right shoulder  Muscle weakness (generalized)  Cramp and spasm  Rationale for Evaluation and Treatment: Rehabilitation  ONSET DATE: one month ago  SUBJECTIVE:  SUBJECTIVE STATEMENT: Patient admits that she has not been doing her exercises "a little bit", but not as much as prescribed secondary to birthday festivities.  Hand dominance: Right  PERTINENT HISTORY: R humeral fx 2022, pacemaker, PD  PAIN:  Are you having pain? Yes: NPRS scale: 3/10 Pain location: lateral R arm Pain description: pressure Aggravating factors: lifting OH and out to the side Relieving factors: resting position  PRECAUTIONS: ICD/Pacemaker and Other: patient reports orthostatic hypotension  RED FLAGS: None   WEIGHT BEARING RESTRICTIONS: No  FALLS:  Has patient fallen in last 6 months? No  LIVING ENVIRONMENT: Lives with: lives with their spouse  OCCUPATION: Retired.  Former Production manager  PLOF: Independent  PATIENT GOALS:get some exercises to loosen up the shoulder  NEXT MD VISIT:   OBJECTIVE:  Note: Objective measures were completed at Evaluation unless  otherwise noted.  DIAGNOSTIC FINDINGS:  HISTORY OF (05/09/21) Anterior dislocation of the right shoulder with mildly displaced comminuted oblique fracture of the proximal right humerus.  PATIENT SURVEYS:  Eval:  Quick Dash  20.5 / 100 = 20.5 %     COGNITION: Overall cognitive status: Within functional limits for tasks assessed     SENSATION: WFL  POSTURE: Mild winging B Rounded shoulders  UPPER EXTREMITY ROM:   A/P ROM Right eval Left eval  Shoulder flexion 114/130 115/165  Shoulder extension    Shoulder abduction 50*/58* 135/145  Shoulder adduction    Shoulder internal rotation 65/72 F 4 inch below L 64 and F WNL  Shoulder external rotation 45/56* 78  Elbow flexion WNL WNL  Elbow extension WNL WNL  Wrist flexion    Wrist extension    Wrist ulnar deviation    Wrist radial deviation    Wrist pronation    Wrist supination    (Blank rows = not tested) Key: WFL = within functional limits not formally assessed, * = concordant pain, s = stiffness/stretching sensation, NT = not tested)  UPPER EXTREMITY MMT:  MMT Right eval Left eval  Shoulder flexion 4* 4+  Shoulder extension 5 5  Shoulder abduction 2+* 4+  Shoulder adduction    Shoulder internal rotation 4 5  Shoulder external rotation 4-* 4  Middle trapezius    Lower trapezius    Elbow flexion    Elbow extension    Wrist flexion    Wrist extension    Wrist ulnar deviation    Wrist radial deviation    Wrist pronation    Wrist supination    Grip strength (lbs)    (Blank rows = not tested)     SHOULDER SPECIAL TESTS: Impingement tests: Hawkins/Kennedy impingement test: negative Rotator cuff assessment: Drop arm test: negative, Empty can test: negative, and Full can test: negative Biceps assessment: Speed's test: negative  JOINT MOBILITY TESTING:  Decreased post G/H Post mobility, decreased scapular mobility  PALPATION:  Palpation: TTP at R UT, ant shoulder, parascapular muscles med and laterally.  TODAY'S TREATMENT  DATE: 08/09/2023 Pulleys for flexion and abduction x2 min each Standing wall wash for flexion.  2x10 bilat Seated wall wash for scaption.  2x10 bilat Seated scapular retraction 2x10 Seated shoulder abduction with cane 2x10 right arm Seated assisted shoulder flexion with hands grasped 2x10 Seated biceps curls with 1# dumbbells 2x10 bilat Seated shoulder ER with yellow tband 2x10 Seated shoulder IR with yellow tband 2x10 right arm  DATE: 07/24/23  See pt ed and HEP   PATIENT EDUCATION: Education details: PT eval findings, anticipated POC, initial HEP, postural awareness, and role of DN  Person educated: Patient Education method: Explanation, Demonstration, and Handouts Education comprehension: verbalized understanding and returned demonstration  HOME EXERCISE PROGRAM: Access Code: [email or web address] URL: [email or web address] Date: 07/25/2023 Prepared by: Raynelle Fanning  Exercises - Supine Shoulder Flexion Extension AAROM with Dowel  - 2-3 x daily - 7 x weekly - 1-3 sets - 10 reps - Seated Shoulder Abduction AAROM with Dowel (Mirrored)  - 2 x daily - 7 x weekly - 2-3 sets - 10 reps - 3-5 sec hold - Standing Bilateral Shoulder Internal Rotation AAROM with Dowel  - 2 x daily - 3 x weekly - 2-3 sets - 10 reps - Supine Shoulder External Rotation in 45 Degrees Abduction AAROM with Dowel (Mirrored)  - 2 x daily - 3 x weekly - 2-3 sets - 10 reps  ASSESSMENT:  CLINICAL IMPRESSION: Ms Kaigler presents to skilled PT reporting that she has not done as many of her HEP as prescribed, secondary to celebrating her birthday, but she has done some.  Educated patient on the importance of proper hydration to prevent hypotension and she verbalizes understanding.  Patient able to perform minimal exercises in standing position, but felt more confident in seated  position secondary to Hx of hypotension and syncope.  Patient continues with decreased ROM with her right arm with pain at end range.  Patient would benefit from continued skilled PT to progress towards goal related activities.  OBJECTIVE IMPAIRMENTS: decreased activity tolerance, decreased ROM, decreased strength, hypomobility, increased muscle spasms, impaired flexibility, impaired UE functional use, postural dysfunction, and pain.   ACTIVITY LIMITATIONS: lifting, dressing, reach over head, and hygiene/grooming  PARTICIPATION LIMITATIONS:  anything OH  PERSONAL FACTORS: Age, Fitness, and 1-2 comorbidities: previous humeral fx and PD  are also affecting patient's functional outcome.   REHAB POTENTIAL: Good  CLINICAL DECISION MAKING: Evolving/moderate complexity  EVALUATION COMPLEXITY: Moderate   GOALS: Goals reviewed with patient? Yes  SHORT TERM GOALS: Target date: 08/22/2023   Patient will be independent with initial HEP.  Baseline:  Goal status: Ongoing  2.  Decreased pain by > = 25% with ADLs  Baseline:  Goal status: Ongoing  3.  Improved shoulder ABD to 90 deg or more  Baseline:  Goal status: INITIAL   LONG TERM GOALS: Target date: 09/19/2023   Patient will be independent with advanced/ongoing HEP to improve outcomes and carryover.  Baseline:  Goal status: INITIAL  2.  Patient will report 75% improvement in R shoulder pain to improve QOL.  Baseline:  Goal status: INITIAL  3.  Patient to improve R shoulder AROM to [facility] without pain provocation to allow for increased ease of ADLs.  Baseline:  Goal status: INITIAL  4.  Patient will demonstrate improved UE strength to 4+/5.  Baseline:  Goal status: INITIAL  5. Decreased Quick Dash to 10 points demonstrating improved functional ability.  Baseline: 20 Goal status: INITIAL   PLAN:  PT FREQUENCY: 2x/week  PT  DURATION: 8 weeks  PLANNED INTERVENTIONS: 97164- PT Re-evaluation, 97110-Therapeutic exercises,  97530- Therapeutic activity, 97112- Neuromuscular re-education, 97535- Self Care, 40981- Manual therapy, Patient/Family education, Taping, Dry Needling, Joint mobilization, Spinal mobilization, Cryotherapy, and Moist heat  PLAN FOR NEXT SESSION: Review and progress HEP, AA/ROM, strengthening, PROM, possible DN if indicated. No ionto/estim (pacemaker). Precaution: Patient reports orthostatic hypotension with supine to stand.    Reather Laurence, PT, DPT 08/09/23, 3:42 PM  [facility] [address] [place], [place] [address] Phone # [phone] Fax [phone]

## 2023-08-16 ENCOUNTER — Encounter: Payer: Self-pay | Admitting: Rehabilitative and Restorative Service Providers"

## 2023-08-16 ENCOUNTER — Ambulatory Visit: Admitting: Rehabilitative and Restorative Service Providers"

## 2023-08-16 DIAGNOSIS — M25511 Pain in right shoulder: Secondary | ICD-10-CM | POA: Diagnosis not present

## 2023-08-16 DIAGNOSIS — R252 Cramp and spasm: Secondary | ICD-10-CM

## 2023-08-16 DIAGNOSIS — M25611 Stiffness of right shoulder, not elsewhere classified: Secondary | ICD-10-CM | POA: Diagnosis not present

## 2023-08-16 DIAGNOSIS — M6281 Muscle weakness (generalized): Secondary | ICD-10-CM

## 2023-08-16 NOTE — Therapy (Signed)
 OUTPATIENT PHYSICAL THERAPY TREATMENT NOTE   Patient Name: Lindsey Bryant MRN: [id number] DOB:1940-09-24, 83 y.o., female Today's Date: 08/16/2023  END OF SESSION:  PT End of Session - 08/16/23 1447     Visit Number 3    Date for PT Re-Evaluation 09/19/23    Authorization Type Aet[employer] MCR    Progress Note Due on Visit 10    PT Start Time 1445    PT Stop Time 1525    PT Time Calculation (min) 40 min    Activity Tolerance Patient tolerated treatment well    Behavior During Therapy [facility] for tasks assessed/performed             Past Medical History:  Diagnosis Date   Acute systolic heart failure (HCC)    Arm fracture    Bradycardia    a. 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   Parkinson's disease    Past Surgical History:  Procedure Laterality Date   APPENDECTOMY  1952   COLONOSCOPY     EYE SURGERY  07/2017   Cataract Sx   LEFT HEART CATH AND CORONARY ANGIOGRAPHY N/A 08/03/2016   Procedure: Left Heart Cath and Coronary Angiography;  Surgeon: Marykay Lex, MD;  Location: [facility] INVASIVE CV LAB;  Service: Cardiovascular;  Laterality: N/A;   MELANOMA EXCISION Right 2000   right arm   PACEMAKER PLACEMENT  05/2012   PACEMAKER REVISION N/A 03/14/2012   Procedure: PACEMAKER REVISION;  Surgeon: Hillis Range, MD;  Location: [facility] CATH LAB;  Service: Cardiovascular;  Laterality: N/A;   PERMANENT PACEMAKER INSERTION N/A 03/06/2012   Procedure: PERMANENT PACEMAKER INSERTION;  Surgeon: Duke Salvia, MD;  Location: [facility] CATH LAB;  Service: Cardiovascular;  Laterality: N/A;   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   Patient Active Problem List   Diagnosis Date Noted   Macrocytic anemia 07/15/2021   Pathologic fracture of right humerus 05/31/2021   NICM (nonischemic cardiomyopathy) (HCC) 08/25/2020   Neurogenic orthostatic  hypotension (HCC) 01/01/2020   Obstructed internal hernia    Bilateral lower abdominal pain 07/29/2016   Elevated TSH 07/29/2016   NSTEMI (non-ST elevated myocardial infarction) (HCC)    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/2012   Complete heart block-narrow QRS escape 03/03/2012   MENOPAUSAL SYNDROME 09/17/2009   MELANOMA, 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   Parkinson's disease (HCC) 11/21/2006   Irritable bowel syndrome 11/21/2006    PCP: Kristian Covey, MD   REFERRING PROVIDER: Kristian Covey, MD   REFERRING DIAG: M25.611 (ICD-10-CM) - Decreased range of motion of right shoulder   THERAPY DIAG:  Stiffness of right shoulder, not elsewhere classified  Acute pain of right shoulder  Muscle weakness (generalized)  Cramp and spasm  Rationale for Evaluation and Treatment: Rehabilitation  ONSET DATE: one month ago  SUBJECTIVE:  SUBJECTIVE STATEMENT: Patient reports that she has been trying to stay properly hydrated.  States pain 0/10 at rest and 7/10 pain with use.  Hand dominance: Right  PERTINENT HISTORY: R humeral fx 2022, pacemaker, PD  PAIN:  Are you having pain? Yes: NPRS scale: 0-7/10 Pain location: lateral R arm Pain description: pressure Aggravating factors: lifting OH and out to the side Relieving factors: resting position  PRECAUTIONS: ICD/Pacemaker and Other: patient reports orthostatic hypotension  RED FLAGS: None   WEIGHT BEARING RESTRICTIONS: No  FALLS:  Has patient fallen in last 6 months? No  LIVING ENVIRONMENT: Lives with: lives with their spouse  OCCUPATION: Retired.  Former Production manager  PLOF: Independent  PATIENT GOALS:get some exercises to loosen up the shoulder  NEXT MD VISIT:   OBJECTIVE:  Note: Objective measures were completed at Evaluation unless otherwise  noted.  DIAGNOSTIC FINDINGS:  HISTORY OF (05/09/21) Anterior dislocation of the right shoulder with mildly displaced comminuted oblique fracture of the proximal right humerus.  PATIENT SURVEYS:  Eval:  Quick Dash  20.5 / 100 = 20.5 %     COGNITION: Overall cognitive status: Within functional limits for tasks assessed     SENSATION: WFL  POSTURE: Mild winging B Rounded shoulders  UPPER EXTREMITY ROM:   A/P ROM Right eval A/ROM  in sitting Right 08/16/23 Left eval  Shoulder flexion 114/130 98 115/165  Shoulder extension     Shoulder abduction 50*/58* 68 135/145  Shoulder adduction     Shoulder internal rotation 65/72 F 4 inch below L  64 and F WNL  Shoulder external rotation 45/56*  78  Elbow flexion WNL  WNL  Elbow extension WNL  WNL  Wrist flexion     Wrist extension     Wrist ulnar deviation     Wrist radial deviation     Wrist pronation     Wrist supination     (Blank rows = not tested) Key: WFL = within functional limits not formally assessed, * = concordant pain, s = stiffness/stretching sensation, NT = not tested)  UPPER EXTREMITY MMT:  MMT Right eval Left eval  Shoulder flexion 4* 4+  Shoulder extension 5 5  Shoulder abduction 2+* 4+  Shoulder adduction    Shoulder internal rotation 4 5  Shoulder external rotation 4-* 4  Middle trapezius    Lower trapezius    Elbow flexion    Elbow extension    Wrist flexion    Wrist extension    Wrist ulnar deviation    Wrist radial deviation    Wrist pronation    Wrist supination    Grip strength (lbs)    (Blank rows = not tested)     SHOULDER SPECIAL TESTS: Impingement tests: Hawkins/Kennedy impingement test: negative Rotator cuff assessment: Drop arm test: negative, Empty can test: negative, and Full can test: negative Biceps assessment: Speed's test: negative  JOINT MOBILITY TESTING:  Decreased post G/H Post mobility, decreased scapular mobility  PALPATION:  Palpation: TTP at R UT, ant  shoulder, parascapular muscles med and laterally.  TODAY'S TREATMENT   DATE: 08/16/2023 Pulleys for flexion and abduction x2 min each Seated shoulder flexion rolling beach ball up the wall 2x10 Seated wall wash for scaption.  2x10 bilat Seated scapular retraction 2x10 Seated shoulder abduction with cane 2x10 bilat Seated assisted shoulder flexion with hands grasped 2x10 Seated shoulder ER with yellow tband 2x10 Seated shoulder IR with yellow tband 2x10 right arm Seated shoulder rows with yellow tband 2x10 Seated blue pball rollout for shoulder flexion 2x10 Seated biceps curls with 1# dumbbells 2x10 bilat Seated P/ROM to right shoulder in all planes of motion with gentle grade 2 joints mobs to promote increased ROM   DATE: 08/09/2023 Pulleys for flexion and abduction x2 min each Standing wall wash for flexion.  2x10 bilat Seated wall wash for scaption.  2x10 bilat Seated scapular retraction 2x10 Seated shoulder abduction with cane 2x10 right arm Seated assisted shoulder flexion with hands grasped 2x10 Seated biceps curls with 1# dumbbells 2x10 bilat Seated shoulder ER with yellow tband 2x10 Seated shoulder IR with yellow tband 2x10 right arm  DATE: 07/24/23  See pt ed and HEP   PATIENT EDUCATION: Education details: PT eval findings, anticipated POC, initial HEP, postural awareness, and role of DN  Person educated: Patient Education method: Explanation, Demonstration, and Handouts Education comprehension: verbalized understanding and returned demonstration  HOME EXERCISE PROGRAM: Access Code: [email or web address] URL: [email or web address] Date: 07/25/2023 Prepared by: Raynelle Fanning  Exercises - Supine Shoulder Flexion Extension AAROM with Dowel  - 2-3 x daily - 7 x weekly - 1-3 sets - 10 reps - Seated Shoulder Abduction AAROM with Dowel (Mirrored)  - 2 x  daily - 7 x weekly - 2-3 sets - 10 reps - 3-5 sec hold - Standing Bilateral Shoulder Internal Rotation AAROM with Dowel  - 2 x daily - 3 x weekly - 2-3 sets - 10 reps - Supine Shoulder External Rotation in 45 Degrees Abduction AAROM with Dowel (Mirrored)  - 2 x daily - 3 x weekly - 2-3 sets - 10 reps  ASSESSMENT:  CLINICAL IMPRESSION: Ms Wiegand presents to skilled PT reporting that she has been doing her exercises more consistently and drinking more water.  Patient able to progress with seated exercises to promote strengthening and increased ROM.  Patient with tightness in shoulder girdle, so performed some gentle grade 2 joint mobs today to assist with P/ROM at end of session. Able to measure right shoulder abduction at end of session and it has noted improvements with mobility.  Patient continues to require skilled PT to progress with goal related activities.  OBJECTIVE IMPAIRMENTS: decreased activity tolerance, decreased ROM, decreased strength, hypomobility, increased muscle spasms, impaired flexibility, impaired UE functional use, postural dysfunction, and pain.   ACTIVITY LIMITATIONS: lifting, dressing, reach over head, and hygiene/grooming  PARTICIPATION LIMITATIONS:  anything OH  PERSONAL FACTORS: Age, Fitness, and 1-2 comorbidities: previous humeral fx and PD  are also affecting patient's functional outcome.   REHAB POTENTIAL: Good  CLINICAL DECISION MAKING: Evolving/moderate complexity  EVALUATION COMPLEXITY: Moderate   GOALS: Goals reviewed with patient? Yes  SHORT TERM GOALS: Target date: 08/22/2023   Patient will be independent with initial HEP.  Baseline:  Goal status: Ongoing  2.  Decreased pain by > = 25% with ADLs  Baseline:  Goal status: Ongoing  3.  Improved shoulder ABD to 90 deg or more  Baseline:  Goal status: Ongoing (see above)   LONG TERM GOALS: Target date: 09/19/2023   Patient will be independent with advanced/ongoing HEP to improve  outcomes and  carryover.  Baseline:  Goal status: INITIAL  2.  Patient will report 75% improvement in R shoulder pain to improve QOL.  Baseline:  Goal status: INITIAL  3.  Patient to improve R shoulder AROM to [facility] without pain provocation to allow for increased ease of ADLs.  Baseline:  Goal status: INITIAL  4.  Patient will demonstrate improved UE strength to 4+/5.  Baseline:  Goal status: INITIAL  5. Decreased Quick Dash to 10 points demonstrating improved functional ability.  Baseline: 20 Goal status: INITIAL   PLAN:  PT FREQUENCY: 2x/week  PT DURATION: 8 weeks  PLANNED INTERVENTIONS: 97164- PT Re-evaluation, 97110-Therapeutic exercises, 97530- Therapeutic activity, 97112- Neuromuscular re-education, 97535- Self Care, 52841- Manual therapy, Patient/Family education, Taping, Dry Needling, Joint mobilization, Spinal mobilization, Cryotherapy, and Moist heat  PLAN FOR NEXT SESSION: Review and progress HEP, AA/ROM, strengthening, PROM, possible DN if indicated. No ionto/estim (pacemaker). Precaution: Patient reports orthostatic hypotension with supine to stand.    Reather Laurence, PT, DPT 08/16/23, 3:42 PM  [facility] [address] [place], [place] [address] Phone # [phone] Fax [phone]

## 2023-08-19 ENCOUNTER — Encounter: Admitting: Rehabilitative and Restorative Service Providers"

## 2023-08-23 ENCOUNTER — Encounter: Payer: Self-pay | Admitting: Family Medicine

## 2023-08-23 ENCOUNTER — Ambulatory Visit: Admitting: Rehabilitative and Restorative Service Providers"

## 2023-08-24 MED ORDER — ESTRADIOL 0.1 MG/GM VA CREA: TOPICAL_CREAM | VAGINAL | 11 refills | Status: AC

## 2023-08-24 NOTE — Telephone Encounter (Signed)
 Refills of Estrace vaginal cream sent  Kristian Covey MD [facility]

## 2023-08-26 ENCOUNTER — Encounter: Admitting: Physical Therapy

## 2023-08-30 ENCOUNTER — Ambulatory Visit: Attending: Family Medicine | Admitting: Rehabilitative and Restorative Service Providers"

## 2023-08-30 ENCOUNTER — Encounter: Payer: Self-pay | Admitting: Rehabilitative and Restorative Service Providers"

## 2023-08-30 DIAGNOSIS — M25611 Stiffness of right shoulder, not elsewhere classified: Secondary | ICD-10-CM | POA: Diagnosis not present

## 2023-08-30 DIAGNOSIS — M6281 Muscle weakness (generalized): Secondary | ICD-10-CM | POA: Diagnosis not present

## 2023-08-30 DIAGNOSIS — R252 Cramp and spasm: Secondary | ICD-10-CM | POA: Insufficient documentation

## 2023-08-30 DIAGNOSIS — M25511 Pain in right shoulder: Secondary | ICD-10-CM | POA: Insufficient documentation

## 2023-08-30 NOTE — Therapy (Signed)
 OUTPATIENT PHYSICAL THERAPY TREATMENT NOTE   Patient Name: Lindsey Bryant MRN: [id number] DOB:July 04, 1940, 83 y.o., female Today's Date: 08/30/2023  END OF SESSION:  PT End of Session - 08/30/23 1448     Visit Number 4    Date for PT Re-Evaluation 09/19/23    Authorization Type [employer] MCR    Progress Note Due on Visit 10    PT Start Time 1445    PT Stop Time 1525    PT Time Calculation (min) 40 min    Activity Tolerance Patient tolerated treatment well    Behavior During Therapy [facility] for tasks assessed/performed             Past Medical History:  Diagnosis Date   Acute systolic heart failure (HCC)    Arm fracture    Bradycardia    a. 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   Parkinson's disease    Past Surgical History:  Procedure Laterality Date   APPENDECTOMY  1952   COLONOSCOPY     EYE SURGERY  07/2017   Cataract Sx   LEFT HEART CATH AND CORONARY ANGIOGRAPHY N/A 08/03/2016   Procedure: Left Heart Cath and Coronary Angiography;  Surgeon: Marykay Lex, MD;  Location: [facility] INVASIVE CV LAB;  Service: Cardiovascular;  Laterality: N/A;   MELANOMA EXCISION Right 2000   right arm   PACEMAKER PLACEMENT  05/2012   PACEMAKER REVISION N/A 03/14/2012   Procedure: PACEMAKER REVISION;  Surgeon: Hillis Range, MD;  Location: [facility] CATH LAB;  Service: Cardiovascular;  Laterality: N/A;   PERMANENT PACEMAKER INSERTION N/A 03/06/2012   Procedure: PERMANENT PACEMAKER INSERTION;  Surgeon: Duke Salvia, MD;  Location: [facility] CATH LAB;  Service: Cardiovascular;  Laterality: N/A;   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   Patient Active Problem List   Diagnosis Date Noted   Macrocytic anemia 07/15/2021   Pathologic fracture of right humerus 05/31/2021   NICM (nonischemic cardiomyopathy) (HCC) 08/25/2020   Neurogenic orthostatic  hypotension (HCC) 01/01/2020   Obstructed internal hernia    Bilateral lower abdominal pain 07/29/2016   Elevated TSH 07/29/2016   NSTEMI (non-ST elevated myocardial infarction) (HCC)    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/2012   Complete heart block-narrow QRS escape 03/03/2012   MENOPAUSAL SYNDROME 09/17/2009   MELANOMA, 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   Parkinson's disease (HCC) 11/21/2006   Irritable bowel syndrome 11/21/2006    PCP: Kristian Covey, MD   REFERRING PROVIDER: Kristian Covey, MD   REFERRING DIAG: M25.611 (ICD-10-CM) - Decreased range of motion of right shoulder   THERAPY DIAG:  Stiffness of right shoulder, not elsewhere classified  Acute pain of right shoulder  Muscle weakness (generalized)  Cramp and spasm  Rationale for Evaluation and Treatment: Rehabilitation  ONSET DATE: one month ago  SUBJECTIVE:  SUBJECTIVE STATEMENT: Patient reports that her pain is around a 5/10 with use today.  States no new complaints.  Hand dominance: Right  PERTINENT HISTORY: R humeral fx 2022, pacemaker, PD  PAIN:  Are you having pain? Yes: NPRS scale: 5/10 Pain location: lateral R arm Pain description: pressure Aggravating factors: lifting OH and out to the side Relieving factors: resting position  PRECAUTIONS: ICD/Pacemaker and Other: patient reports orthostatic hypotension  RED FLAGS: None   WEIGHT BEARING RESTRICTIONS: No  FALLS:  Has patient fallen in last 6 months? No  LIVING ENVIRONMENT: Lives with: lives with their spouse  OCCUPATION: Retired.  Former Production manager  PLOF: Independent  PATIENT GOALS:get some exercises to loosen up the shoulder  NEXT MD VISIT:   OBJECTIVE:  Note: Objective measures were completed at Evaluation unless otherwise noted.  DIAGNOSTIC FINDINGS:  HISTORY  OF (05/09/21) Anterior dislocation of the right shoulder with mildly displaced comminuted oblique fracture of the proximal right humerus.  PATIENT SURVEYS:  Eval:  Quick Dash  20.5 / 100 = 20.5 %     COGNITION: Overall cognitive status: Within functional limits for tasks assessed     SENSATION: WFL  POSTURE: Mild winging B Rounded shoulders  UPPER EXTREMITY ROM:   A/P ROM Right eval A/ROM  in sitting Right 08/16/23 AA/ROM  in sitting Right 08/30/23 Left eval  Shoulder flexion 114/130 98 100 115/165  Shoulder extension      Shoulder abduction 50*/58* 68 80 135/145  Shoulder adduction      Shoulder internal rotation 65/72 F 4 inch below L   64 and F WNL  Shoulder external rotation 45/56*   78  Elbow flexion WNL   WNL  Elbow extension WNL   WNL  Wrist flexion      Wrist extension      Wrist ulnar deviation      Wrist radial deviation      Wrist pronation      Wrist supination      (Blank rows = not tested) Key: WFL = within functional limits not formally assessed, * = concordant pain, s = stiffness/stretching sensation, NT = not tested)  UPPER EXTREMITY MMT:  MMT Right eval Left eval  Shoulder flexion 4* 4+  Shoulder extension 5 5  Shoulder abduction 2+* 4+  Shoulder adduction    Shoulder internal rotation 4 5  Shoulder external rotation 4-* 4  Middle trapezius    Lower trapezius    Elbow flexion    Elbow extension    Wrist flexion    Wrist extension    Wrist ulnar deviation    Wrist radial deviation    Wrist pronation    Wrist supination    Grip strength (lbs)    (Blank rows = not tested)     SHOULDER SPECIAL TESTS: Impingement tests: Hawkins/Kennedy impingement test: negative Rotator cuff assessment: Drop arm test: negative, Empty can test: negative, and Full can test: negative Biceps assessment: Speed's test: negative  JOINT MOBILITY TESTING:  Decreased post G/H Post mobility, decreased scapular mobility  PALPATION:  Palpation: TTP at R UT,  ant shoulder, parascapular muscles med and laterally.  TODAY'S TREATMENT   DATE: 08/30/2023 Pulleys for flexion and abduction x2 min each Seated blue pball rollout 2x10 Seated blue pball rollout for right shoulder scaption 2x10 Seated shoulder AA/ROM with hands clasped 2x10 Seated scapular retraction 2x10 Seated shoulder ER with yellow tband 2x10 Seated shoulder IR with yellow tband 2x10 right arm Seated biceps curls with 1# dumbbells 2x10 bilat Seated shoulder rows with yellow tband 2x10 Seated shoulder extension with yellow tband 2x10 Seated forward shoulder punch with 1# dumbbells 2x10 bilat Seated P/ROM to right shoulder in all planes of motion with gentle grade 2 joints mobs to promote increased ROM Seated soft tissue mobilization to right upper trap with manual trigger point release   DATE: 08/16/2023 Pulleys for flexion and abduction x2 min each Seated shoulder flexion rolling beach ball up the wall 2x10 Seated wall wash for scaption.  2x10 bilat Seated scapular retraction 2x10 Seated shoulder abduction with cane 2x10 bilat Seated assisted shoulder flexion with hands grasped 2x10 Seated shoulder ER with yellow tband 2x10 Seated shoulder IR with yellow tband 2x10 right arm Seated shoulder rows with yellow tband 2x10 Seated blue pball rollout for shoulder flexion 2x10 Seated biceps curls with 1# dumbbells 2x10 bilat Seated P/ROM to right shoulder in all planes of motion with gentle grade 2 joints mobs to promote increased ROM   DATE: 08/09/2023 Pulleys for flexion and abduction x2 min each Standing wall wash for flexion.  2x10 bilat Seated wall wash for scaption.  2x10 bilat Seated scapular retraction 2x10 Seated shoulder abduction with cane 2x10 right arm Seated assisted shoulder flexion with hands grasped 2x10 Seated biceps curls with 1# dumbbells  2x10 bilat Seated shoulder ER with yellow tband 2x10 Seated shoulder IR with yellow tband 2x10 right arm   PATIENT EDUCATION: Education details: PT eval findings, anticipated POC, initial HEP, postural awareness, and role of DN  Person educated: Patient Education method: Explanation, Demonstration, and Handouts Education comprehension: verbalized understanding and returned demonstration  HOME EXERCISE PROGRAM: Access Code: [email or web address] URL: [email or web address] Date: 08/30/2023 Prepared by: Reather Laurence  Exercises - Supine Shoulder Flexion Extension AAROM with Dowel  - 2-3 x daily - 7 x weekly - 1-3 sets - 10 reps - Supine Shoulder External Rotation in 45 Degrees Abduction AAROM with Dowel (Mirrored)  - 2 x daily - 3 x weekly - 2-3 sets - 10 reps - Seated Shoulder Abduction AAROM with Dowel (Mirrored)  - 2 x daily - 7 x weekly - 2-3 sets - 10 reps - 3-5 sec hold - Standing Bilateral Shoulder Internal Rotation AAROM with Dowel  - 2 x daily - 3 x weekly - 2-3 sets - 10 reps - Seated Scapular Retraction  - 1 x daily - 7 x weekly - 2 sets - 10 reps - Seated Shoulder Flexion Towel Slide at Table Top  - 1 x daily - 7 x weekly - 2 sets - 10 reps - Seated Shoulder Abduction Towel Slide at Table Top  - 1 x daily - 7 x weekly - 2 sets - 10 reps  ASSESSMENT:  CLINICAL IMPRESSION: Ms Ozga presents to skilled PT reporting that she is overall feeling better.  Patient provided with updated HEP today to assist with progressing shoulder AA/ROM.  Patient continues to require min cuing throughout for improved technique and to gently perform shoulder stretching at end range of exercises.  Pt with slight improvements noted with AA/ROM compared to last visit.  Patient provided with new print-out and reviewed with her and her husband.  Patient continues to require skilled PT to progress towards goal related activities.  OBJECTIVE IMPAIRMENTS: decreased activity tolerance, decreased ROM,  decreased strength, hypomobility, increased muscle spasms, impaired flexibility, impaired UE functional use, postural dysfunction, and pain.   ACTIVITY LIMITATIONS: lifting, dressing, reach over head, and hygiene/grooming  PARTICIPATION LIMITATIONS:  anything OH  PERSONAL FACTORS: Age, Fitness, and 1-2 comorbidities: previous humeral fx and PD  are also affecting patient's functional outcome.   REHAB POTENTIAL: Good  CLINICAL DECISION MAKING: Evolving/moderate complexity  EVALUATION COMPLEXITY: Moderate   GOALS: Goals reviewed with patient? Yes  SHORT TERM GOALS: Target date: 08/22/2023   Patient will be independent with initial HEP.  Baseline:  Goal status: Met on 08/30/23  2.  Decreased pain by > = 25% with ADLs  Baseline:  Goal status: Ongoing  3.  Improved shoulder ABD to 90 deg or more  Baseline:  Goal status: Ongoing (see above)   LONG TERM GOALS: Target date: 09/19/2023   Patient will be independent with advanced/ongoing HEP to improve outcomes and carryover.  Baseline:  Goal status: INITIAL  2.  Patient will report 75% improvement in R shoulder pain to improve QOL.  Baseline:  Goal status: INITIAL  3.  Patient to improve R shoulder AROM to [facility] without pain provocation to allow for increased ease of ADLs.  Baseline:  Goal status: INITIAL  4.  Patient will demonstrate improved UE strength to 4+/5.  Baseline:  Goal status: INITIAL  5. Decreased Quick Dash to 10 points demonstrating improved functional ability.  Baseline: 20 Goal status: INITIAL   PLAN:  PT FREQUENCY: 2x/week  PT DURATION: 8 weeks  PLANNED INTERVENTIONS: 97164- PT Re-evaluation, 97110-Therapeutic exercises, 97530- Therapeutic activity, 97112- Neuromuscular re-education, 97535- Self Care, [id number]- Manual therapy, Patient/Family education, Taping, Dry Needling, Joint mobilization, Spinal mobilization, Cryotherapy, and Moist heat  PLAN FOR NEXT SESSION: Review and progress HEP, AA/ROM,  strengthening, PROM, possible DN if indicated. No ionto/estim (pacemaker). Precaution: Patient reports orthostatic hypotension with supine to stand.    Reather Laurence, PT, DPT 08/30/23, 3:35 PM  [facility] [address], [address] [place], [place] [address] Phone # [phone] Fax [phone]

## 2023-09-02 ENCOUNTER — Encounter: Admitting: Rehabilitative and Restorative Service Providers"

## 2023-09-05 ENCOUNTER — Encounter: Payer: Self-pay | Admitting: Internal Medicine

## 2023-09-05 NOTE — Therapy (Unsigned)
 OUTPATIENT PHYSICAL THERAPY TREATMENT NOTE   Patient Name: Lindsey Bryant MRN: [id number] DOB:1940/07/24, 83 y.o., female Today's Date: 09/06/2023  END OF SESSION:  PT End of Session - 09/06/23 1452     Visit Number 5    Date for PT Re-Evaluation 09/19/23    Authorization Type [employer] MCR    Progress Note Due on Visit 10    PT Start Time 1451    PT Stop Time 1534    PT Time Calculation (min) 43 min    Activity Tolerance Patient tolerated treatment well    Behavior During Therapy [facility] for tasks assessed/performed              Past Medical History:  Diagnosis Date   Acute systolic heart failure (HCC)    Arm fracture    Bradycardia    a. 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   Parkinson's disease    Past Surgical History:  Procedure Laterality Date   APPENDECTOMY  1952   COLONOSCOPY     EYE SURGERY  07/2017   Cataract Sx   LEFT HEART CATH AND CORONARY ANGIOGRAPHY N/A 08/03/2016   Procedure: Left Heart Cath and Coronary Angiography;  Surgeon: Arleen Lacer, MD;  Location: [facility] INVASIVE CV LAB;  Service: Cardiovascular;  Laterality: N/A;   MELANOMA EXCISION Right 2000   right arm   PACEMAKER PLACEMENT  05/2012   PACEMAKER REVISION N/A 03/14/2012   Procedure: PACEMAKER REVISION;  Surgeon: Jolly Needle, MD;  Location: [facility] CATH LAB;  Service: Cardiovascular;  Laterality: N/A;   PERMANENT PACEMAKER INSERTION N/A 03/06/2012   Procedure: PERMANENT PACEMAKER INSERTION;  Surgeon: Verona Goodwill, MD;  Location: [facility] CATH LAB;  Service: Cardiovascular;  Laterality: N/A;   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   Patient Active Problem List   Diagnosis Date Noted   Macrocytic anemia 07/15/2021   Pathologic fracture of right humerus 05/31/2021   NICM (nonischemic cardiomyopathy) (HCC) 08/25/2020   Neurogenic orthostatic  hypotension (HCC) 01/01/2020   Obstructed internal hernia    Bilateral lower abdominal pain 07/29/2016   Elevated TSH 07/29/2016   NSTEMI (non-ST elevated myocardial infarction) (HCC)    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/2012   Complete heart block-narrow QRS escape 03/03/2012   MENOPAUSAL SYNDROME 09/17/2009   MELANOMA, 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   Parkinson's disease (HCC) 11/21/2006   Irritable bowel syndrome 11/21/2006    PCP: Marquetta Sit, MD   REFERRING PROVIDER: Marquetta Sit, MD   REFERRING DIAG: M25.611 (ICD-10-CM) - Decreased range of motion of right shoulder   THERAPY DIAG:  Stiffness of right shoulder, not elsewhere classified  Acute pain of right shoulder  Muscle weakness (generalized)  Cramp and spasm  Rationale for Evaluation and Treatment: Rehabilitation  ONSET DATE: one month ago  SUBJECTIVE:  SUBJECTIVE STATEMENT: I am doing better.   Hand dominance: Right  PERTINENT HISTORY: R humeral fx 2022, pacemaker, PD  PAIN:  Are you having pain? Yes: NPRS scale: 5/10 Pain location: lateral R arm Pain description: pressure Aggravating factors: lifting OH and out to the side Relieving factors: resting position  PRECAUTIONS: ICD/Pacemaker and Other: patient reports orthostatic hypotension  RED FLAGS: None   WEIGHT BEARING RESTRICTIONS: No  FALLS:  Has patient fallen in last 6 months? No  LIVING ENVIRONMENT: Lives with: lives with their spouse  OCCUPATION: Retired.  Former Production manager  PLOF: Independent  PATIENT GOALS:get some exercises to loosen up the shoulder  NEXT MD VISIT:   OBJECTIVE:  Note: Objective measures were completed at Evaluation unless otherwise noted.  DIAGNOSTIC FINDINGS:  HISTORY OF (05/09/21) Anterior dislocation of the right shoulder with mildly  displaced comminuted oblique fracture of the proximal right humerus.  PATIENT SURVEYS:  Eval:  Quick Dash  20.5 / 100 = 20.5 %     COGNITION: Overall cognitive status: Within functional limits for tasks assessed     SENSATION: WFL  POSTURE: Mild winging B Rounded shoulders  UPPER EXTREMITY ROM:   A/P ROM Right eval A/ROM  in sitting Right 08/16/23 AA/ROM  in sitting Right 08/30/23 Left eval  Shoulder flexion 114/130 98 100 115/165  Shoulder extension      Shoulder abduction 50*/58* 68 80 135/145  Shoulder adduction      Shoulder internal rotation 65/72 F 4 inch below L   64 and F WNL  Shoulder external rotation 45/56*   78  Elbow flexion WNL   WNL  Elbow extension WNL   WNL  Wrist flexion      Wrist extension      Wrist ulnar deviation      Wrist radial deviation      Wrist pronation      Wrist supination      (Blank rows = not tested) Key: WFL = within functional limits not formally assessed, * = concordant pain, s = stiffness/stretching sensation, NT = not tested)  UPPER EXTREMITY MMT:  MMT Right eval Left eval  Shoulder flexion 4* 4+  Shoulder extension 5 5  Shoulder abduction 2+* 4+  Shoulder adduction    Shoulder internal rotation 4 5  Shoulder external rotation 4-* 4  Middle trapezius    Lower trapezius    Elbow flexion    Elbow extension    Wrist flexion    Wrist extension    Wrist ulnar deviation    Wrist radial deviation    Wrist pronation    Wrist supination    Grip strength (lbs)    (Blank rows = not tested)     SHOULDER SPECIAL TESTS: Impingement tests: Hawkins/Kennedy impingement test: negative Rotator cuff assessment: Drop arm test: negative, Empty can test: negative, and Full can test: negative Biceps assessment: Speed's test: negative  JOINT MOBILITY TESTING:  Decreased post G/H Post mobility, decreased scapular mobility  PALPATION:  Palpation: TTP at R UT, ant shoulder, parascapular muscles med and laterally.  TODAY'S TREATMENT   DATE: 09/06/2023 Pulleys for flexion and abduction x2 min each Seated blue pball rollout 2x10 Seated blue pball rollout for right shoulder scaption 2x10 Seated shoulder AA/ROM with hands clasped 2x10 Seated scapular retraction with blue foam roller along spine for target 2x10 Seated shoulder ER with yellow tband 2x10 Seated shoulder IR with yellow tband 2x10 right arm Seated biceps curls with 2# dumbbells 2x10 bilat Seated shoulder rows with yellow tband 2x10 Seated shoulder extension with yellow tband 2x10 Seated forward shoulder punch with 1# dumbbells 2x10 bilat Supine ABD to 90 deg  2 x 10 Supine flex and scaption 1# R to 90 deg  2x10 Supine B shoulder ER/IR with 1# 2x10    DATE: 08/30/2023 Pulleys for flexion and abduction x2 min each Seated blue pball rollout 2x10 Seated blue pball rollout for right shoulder scaption 2x10 Seated shoulder AA/ROM with hands clasped 2x10 Seated scapular retraction 2x10 Seated shoulder ER with yellow tband 2x10 Seated shoulder IR with yellow tband 2x10 right arm Seated biceps curls with 1# dumbbells 2x10 bilat Seated shoulder rows with yellow tband 2x10 Seated shoulder extension with yellow tband 2x10 Seated forward shoulder punch with 1# dumbbells 2x10 bilat Seated P/ROM to right shoulder in all planes of motion with gentle grade 2 joints mobs to promote increased ROM Seated soft tissue mobilization to right upper trap with manual trigger point release   DATE: 08/16/2023 Pulleys for flexion and abduction x2 min each Seated shoulder flexion rolling beach ball up the wall 2x10 Seated wall wash for scaption.  2x10 bilat Seated scapular retraction 2x10 Seated shoulder abduction with cane 2x10 bilat Seated assisted shoulder flexion with hands grasped 2x10 Seated shoulder ER with yellow tband 2x10 Seated shoulder  IR with yellow tband 2x10 right arm Seated shoulder rows with yellow tband 2x10 Seated blue pball rollout for shoulder flexion 2x10 Seated biceps curls with 1# dumbbells 2x10 bilat Seated P/ROM to right shoulder in all planes of motion with gentle grade 2 joints mobs to promote increased ROM   DATE: 08/09/2023 Pulleys for flexion and abduction x2 min each Standing wall wash for flexion.  2x10 bilat Seated wall wash for scaption.  2x10 bilat Seated scapular retraction 2x10 Seated shoulder abduction with cane 2x10 right arm Seated assisted shoulder flexion with hands grasped 2x10 Seated biceps curls with 1# dumbbells 2x10 bilat Seated shoulder ER with yellow tband 2x10 Seated shoulder IR with yellow tband 2x10 right arm   PATIENT EDUCATION: Education details: PT eval findings, anticipated POC, initial HEP, postural awareness, and role of DN  Person educated: Patient Education method: Explanation, Demonstration, and Handouts Education comprehension: verbalized understanding and returned demonstration  HOME EXERCISE PROGRAM: Access Code: [email or web address] URL: [email or web address] Date: 09/06/2023 Prepared by: Concha Deed  Exercises - Supine Shoulder Flexion Extension AAROM with Dowel  - 2-3 x daily - 7 x weekly - 1-3 sets - 10 reps - Supine Shoulder External Rotation in 45 Degrees Abduction AAROM with Dowel (Mirrored)  - 2 x daily - 3 x weekly - 2-3 sets - 10 reps - Seated Shoulder Abduction AAROM with Dowel (Mirrored)  - 2 x daily - 7 x weekly - 2-3 sets - 10 reps - 3-5 sec hold - Standing Bilateral Shoulder Internal Rotation AAROM with Dowel  - 2 x daily - 3 x weekly - 2-3 sets - 10 reps - Seated Scapular Retraction  - 1 x daily - 7 x weekly - 2 sets - 10 reps - Seated Shoulder Flexion Towel Slide at  Table Top  - 1 x daily - 7 x weekly - 2 sets - 10 reps - Seated Shoulder Abduction Towel Slide at Table Top  - 1 x daily - 7 x weekly - 2 sets - 10 reps - Supine Shoulder Flexion  with Free Weight  - 1 x daily - 3 x weekly - 2 sets - 10 reps - Supine Shoulder Horizontal Abduction with Dumbbells  - 1 x daily - 3 x weekly - 2 sets - 10 reps - Seated Single Arm Scapular Protraction with Dumbbell  - 1 x daily - 3 x weekly - 2 sets - 10 reps - Seated Scapular Retraction with External Rotation  - 1 x daily - 3 x weekly - 2 sets - 10 reps  ASSESSMENT:  CLINICAL IMPRESSION: Lindsey Bryant is progressing with ROM and strength. She tolerated increased resistance today with some exercises and reports 50% improvement in pain. She is most limited with ABD against gravity. Her flexion and scaption have improved to over 90 deg, overhead activity is still limited. She continues to demonstrate potential for improvement and would benefit from continued skilled therapy to address impairments.  At end of session today, patient stated that she thinks she is done. PT advised that she may make more appointments, but she denied due to company being in town. She will call if she wants to schedule more appointments.   OBJECTIVE IMPAIRMENTS: decreased activity tolerance, decreased ROM, decreased strength, hypomobility, increased muscle spasms, impaired flexibility, impaired UE functional use, postural dysfunction, and pain.   ACTIVITY LIMITATIONS: lifting, dressing, reach over head, and hygiene/grooming  PARTICIPATION LIMITATIONS:  anything OH  PERSONAL FACTORS: Age, Fitness, and 1-2 comorbidities: previous humeral fx and PD  are also affecting patient's functional outcome.   REHAB POTENTIAL: Good  CLINICAL DECISION MAKING: Evolving/moderate complexity  EVALUATION COMPLEXITY: Moderate   GOALS: Goals reviewed with patient? Yes  SHORT TERM GOALS: Target date: 08/22/2023   Patient will be independent with initial HEP.  Baseline:  Goal status: Met on 08/30/23  2.  Decreased pain by > = 25% with ADLs  Baseline:  Goal status: MET 09/05/23 50%  3.  Improved shoulder ABD to 90 deg or more  Baseline:   Goal status: Ongoing (see above)   LONG TERM GOALS: Target date: 09/19/2023   Patient will be independent with advanced/ongoing HEP to improve outcomes and carryover.  Baseline:  Goal status: IN PROGRESS 09/06/23  2.  Patient will report 75% improvement in R shoulder pain to improve QOL.  Baseline:  Goal status: IN PROGRESS 50% 09/06/23  3.  Patient to improve R shoulder AROM to [facility] without pain provocation to allow for increased ease of ADLs.  Baseline:  Goal status: IN PROGRESS 09/06/23  4.  Patient will demonstrate improved UE strength to 4+/5.  Baseline:  Goal status: IN PROGRESS   5. Decreased Quick Dash to 10 points demonstrating improved functional ability.  Baseline: 20 Goal status: INITIAL   PLAN:  PT FREQUENCY: 2x/week  PT DURATION: 8 weeks  PLANNED INTERVENTIONS: 97164- PT Re-evaluation, 97110-Therapeutic exercises, 97530- Therapeutic activity, 97112- Neuromuscular re-education, 97535- Self Care, 16109- Manual therapy, Patient/Family education, Taping, Dry Needling, Joint mobilization, Spinal mobilization, Cryotherapy, and Moist heat  PLAN FOR NEXT SESSION: Review and progress HEP, AA/ROM, strengthening, PROM, possible DN if indicated. No ionto/estim (pacemaker). Precaution: Patient reports orthostatic hypotension with supine to stand.    Jinx Mourning, PT  09/06/23, 3:51 PM  [facility] [address], [address] [place],  [place] [address] Phone # [phone] Fax [phone]

## 2023-09-06 ENCOUNTER — Encounter: Payer: Self-pay | Admitting: Physical Therapy

## 2023-09-06 ENCOUNTER — Ambulatory Visit: Admitting: Physical Therapy

## 2023-09-06 DIAGNOSIS — M25611 Stiffness of right shoulder, not elsewhere classified: Secondary | ICD-10-CM

## 2023-09-06 DIAGNOSIS — M25511 Pain in right shoulder: Secondary | ICD-10-CM | POA: Diagnosis not present

## 2023-09-06 DIAGNOSIS — R252 Cramp and spasm: Secondary | ICD-10-CM | POA: Diagnosis not present

## 2023-09-06 DIAGNOSIS — M6281 Muscle weakness (generalized): Secondary | ICD-10-CM | POA: Diagnosis not present

## 2023-09-08 NOTE — Addendum Note (Signed)
 Addended by: Lott Rouleau A on: 09/08/2023 03:22 PM   Modules accepted: Orders

## 2023-09-08 NOTE — Progress Notes (Signed)
 Remote pacemaker transmission.

## 2023-09-17 ENCOUNTER — Other Ambulatory Visit: Payer: Self-pay | Admitting: Neurology

## 2023-09-17 DIAGNOSIS — G20B1 Parkinson's disease with dyskinesia, without mention of fluctuations: Secondary | ICD-10-CM

## 2023-09-23 ENCOUNTER — Ambulatory Visit: Payer: Medicare HMO | Admitting: Obstetrics

## 2023-10-14 ENCOUNTER — Ambulatory Visit: Admitting: Obstetrics

## 2023-10-14 ENCOUNTER — Encounter: Payer: Self-pay | Admitting: Obstetrics

## 2023-10-14 VITALS — BP 171/74 | HR 73 | Ht 61.42 in | Wt 105.0 lb

## 2023-10-14 DIAGNOSIS — R3914 Feeling of incomplete bladder emptying: Secondary | ICD-10-CM | POA: Insufficient documentation

## 2023-10-14 DIAGNOSIS — N3281 Overactive bladder: Secondary | ICD-10-CM | POA: Diagnosis not present

## 2023-10-14 DIAGNOSIS — R159 Full incontinence of feces: Secondary | ICD-10-CM | POA: Insufficient documentation

## 2023-10-14 DIAGNOSIS — R351 Nocturia: Secondary | ICD-10-CM | POA: Insufficient documentation

## 2023-10-14 LAB — POCT URINALYSIS DIP (CLINITEK)
Bilirubin, UA: NEGATIVE
Blood, UA: NEGATIVE
Glucose, UA: NEGATIVE mg/dL
Ketones, POC UA: NEGATIVE mg/dL
Leukocytes, UA: NEGATIVE
Nitrite, UA: NEGATIVE
POC PROTEIN,UA: NEGATIVE
Spec Grav, UA: 1.01 (ref 1.010–1.025)
Urobilinogen, UA: 0.2 U/dL
pH, UA: 6.5 (ref 5.0–8.0)

## 2023-10-14 MED ORDER — GEMTESA 75 MG PO TABS: 75.0000 mg | ORAL_TABLET | Freq: Every day | ORAL | 2 refills | Status: DC

## 2023-10-14 MED ORDER — GEMTESA 75 MG PO TABS: 75.0000 mg | ORAL_TABLET | Freq: Every day | ORAL | Status: DC

## 2023-10-14 NOTE — Assessment & Plan Note (Signed)
-   FI 5-10x/year, stopped using miralax  daily arund 5-6 months ago - Treatment options include anti-diarrhea medication (loperamide / Imodium  OTC or prescription lomotil), fiber supplements, physical therapy, and possible sacral neuromodulation or surgery.   - encouraged titration of miralax  due to bloating with fiber supplementation in the past. - referral placed for pelvic floor PT

## 2023-10-14 NOTE — Patient Instructions (Addendum)
 We discussed the symptoms of overactive bladder (OAB), which include urinary urgency, urinary frequency, night-time urination, with or without urge incontinence.  We discussed management including behavioral therapy (decreasing bladder irritants by following a bladder diet, urge suppression strategies, timed voids, bladder retraining), physical therapy, medication; and for refractory cases posterior tibial nerve stimulation, sacral neuromodulation, and intravesical botulinum toxin injection.   For Beta-3 agonist medication, we discussed the potential side effect of elevated blood pressure which is more likely to occur in individuals with uncontrolled hypertension. You were given samples for Gemtesa 75 mg.  It can take a month to start working so give it time, but if you have bothersome side effects call sooner and we can try a different medication.  Call us  if you have trouble filling the prescription or if it's not covered by your insurance.  Accidental Bowel Leakage:  - Treatment options include anti-diarrhea medication (loperamide / Imodium  OTC or prescription lomotil), fiber supplements, physical therapy, and possible sacral neuromodulation or surgery.     Women should try to eat at least 21 to 25 grams of fiber a day, while men should aim for 30 to 38 grams a day. You can add fiber to your diet with food or a fiber supplement such as psyllium (metamucil), benefiber, or fibercon.   Here's a look at how much dietary fiber is found in some common foods. When buying packaged foods, check the Nutrition Facts label for fiber content. It can vary among brands.  Fruits Serving size Total fiber (grams)*  Raspberries 1 cup 8.0  Pear 1 medium 5.5  Apple, with skin 1 medium 4.5  Banana 1 medium 3.0  Orange 1 medium 3.0  Strawberries 1 cup 3.0   Vegetables Serving size Total fiber (grams)*  Green peas, boiled 1 cup 9.0  Broccoli, boiled 1 cup chopped 5.0  Turnip greens, boiled 1 cup 5.0  Brussels  sprouts, boiled 1 cup 4.0  Potato, with skin, baked 1 medium 4.0  Sweet corn, boiled 1 cup 3.5  Cauliflower, raw 1 cup chopped 2.0  Carrot, raw 1 medium 1.5   Grains Serving size Total fiber (grams)*  Spaghetti, whole-wheat, cooked 1 cup 6.0  Barley, pearled, cooked 1 cup 6.0  Bran flakes 3/4 cup 5.5  Quinoa, cooked 1 cup 5.0  Oat bran muffin 1 medium 5.0  Oatmeal, instant, cooked 1 cup 5.0  Popcorn, air-popped 3 cups 3.5  Brown rice, cooked 1 cup 3.5  Bread, whole-wheat 1 slice 2.0  Bread, rye 1 slice 2.0   Legumes, nuts and seeds Serving size Total fiber (grams)*  Split peas, boiled 1 cup 16.0  Lentils, boiled 1 cup 15.5  Black beans, boiled 1 cup 15.0  Baked beans, canned 1 cup 10.0  Chia seeds 1 ounce 10.0  Almonds 1 ounce (23 nuts) 3.5  Pistachios 1 ounce (49 nuts) 3.0  Sunflower kernels 1 ounce 3.0  *Rounded to nearest 0.5 gram. Source: [employer] for [employer], Legacy Release    Continue vaginal estrogen 1g twice a week

## 2023-10-14 NOTE — Assessment & Plan Note (Addendum)
-   POCT UA negative - denies leakage, reports worsening symptoms with bradykinesia for around 1 year - encouraged bladder training - We discussed the symptoms of overactive bladder (OAB), which include urinary urgency, urinary frequency, nocturia, with or without urge incontinence.  While we do not know the exact etiology of OAB, several treatment options exist. We discussed management including behavioral therapy (decreasing bladder irritants, urge suppression strategies, timed voids, bladder retraining), physical therapy, medication; for refractory cases posterior tibial nerve stimulation, sacral neuromodulation, and intravesical botulinum toxin injection.  For anticholinergic medications, we discussed the potential side effects of anticholinergics including dry eyes, dry mouth, constipation, cognitive impairment and urinary retention. For Beta-3 agonist medication, we discussed the potential side effect of elevated blood pressure which is more likely to occur in individuals with uncontrolled hypertension. - trial of gemtesa with samples and Rx provided - referred to pelvic floor PT - patient and husband desires to avoid 3rd line therapy at this time

## 2023-10-14 NOTE — Progress Notes (Signed)
 New Patient Evaluation and Consultation  Referring Provider: Tat, Von Grumbling, DO PCP: Marquetta Sit, MD Date of Service: 10/14/2023  SUBJECTIVE Chief Complaint: New Patient (Initial Visit) Lindsey Bryant is a 83 y.o. female here today for urinary urgency.)  History of Present Illness: Lindsey Bryant is a 83 y.o. White or Caucasian female seen in consultation at the request of Dr Tat for evaluation of urinary urgency.    Urinary frequency started around 6-12 months ago.  Kegel exercises and vaginal estrogen with relief.  Added carbidopa /levodopa  25/100 CR at bedtime 06/21/23 Denies change in surgeries.  Worsened bradykinesia this year.  Fell around 04/2022 with pathologic R humerus fracture, declines workup with PCP Self directed pelvic floor exercises, estradiol  cream Reports going to pelvic floor PT for bowel symptoms in the past.   Review of records significant for: Midodrine  for neurogenic orthostatic hypotension  Urinary Symptoms: Does not leak urine.   Day time voids 7-9 increased to 10-14x/day.  Nocturia: 2-5 times per night to void. Voiding dysfunction:  does not empty bladder well.  Patient does not use a catheter to empty bladder.  When urinating, patient feels the need to urinate multiple times in a row Drinks: 48oz water per day, 12oz tea  UTIs: 0 UTI's in the last year.   Denies history of blood in urine, kidney or bladder stones, pyelonephritis, bladder cancer, and kidney cancer No results found for the last 90 days.   Pelvic Organ Prolapse Symptoms:                  Patient Denies a feeling of a bulge the vaginal area.  Bowel Symptom: Bowel movements: 2 time(s) per week with IBS for a few years  Stool consistency: loose Straining: no.  Splinting: no Incomplete evacuation: yes.  Patient Admits to accidental bowel leakage / fecal incontinence  Occurs: 5-10 time(s) per year  Consistency with leakage: liquid Bowel regimen: fiber, stool softener, and  miralax  PRN, stopped using daily due to loose stool around 5-6 months ago Last colonoscopy: Date 2015, Results diminutive polyp resected HM Colonoscopy   This patient has no relevant Health Maintenance data.     Sexual Function Sexually active: no.  Sexual orientation: Straight Pain with sex: No  Pelvic Pain Denies pelvic pain  Past Medical History:  Past Medical History:  Diagnosis Date   Acute systolic heart failure (HCC)    Arm fracture    Bradycardia    a. 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   Parkinson's disease      Past Surgical History:   Past Surgical History:  Procedure Laterality Date   APPENDECTOMY  1952   COLONOSCOPY     EYE SURGERY  07/2017   Cataract Sx   LEFT HEART CATH AND CORONARY ANGIOGRAPHY N/A 08/03/2016   Procedure: Left Heart Cath and Coronary Angiography;  Surgeon: Arleen Lacer, MD;  Location: [facility] INVASIVE CV LAB;  Service: Cardiovascular;  Laterality: N/A;   MELANOMA EXCISION Right 2000   right arm   PACEMAKER PLACEMENT  05/2012   PACEMAKER REVISION N/A 03/14/2012   Procedure: PACEMAKER REVISION;  Surgeon: Jolly Needle, MD;  Location: [facility] CATH LAB;  Service: Cardiovascular;  Laterality: N/A;   PERMANENT PACEMAKER INSERTION N/A 03/06/2012   Procedure: PERMANENT PACEMAKER INSERTION;  Surgeon: Verona Goodwill, MD;  Location: [facility]  CATH LAB;  Service: Cardiovascular;  Laterality: N/A;   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 Last pap smear was 2015, no results for review.  Any history of abnormal pap smears: no. No results found for: "DIAGPAP", "HPVHIGH", "ADEQPAP"  Medications: Patient has a current medication list which includes the following prescription(s): aspirin  ec, carbidopa -levodopa , carbidopa -levodopa  er, dicyclomine , estradiol , melatonin, midodrine , polyethylene glycol powder, probiotic product, propylene glycol, simethicone , gemtesa, gemtesa, and vitamin e.   Allergies: Patient is allergic to oxycodone .   Social History:  Social History   Tobacco Use   Smoking status: Former   Smokeless tobacco: Never   Tobacco comments:    Smoked cigarettes for 15 yrs   Vaping Use   Vaping status: Never Used  Substance Use Topics   Alcohol use: No    Comment: recovering alcoholic   Drug use: No    Relationship status: married Patient lives with her husband.   Patient is not employed. Regular exercise: Yes: walking History of abuse: No  Family History:   Family History  Problem Relation Age of Onset   Heart attack Mother 43       Details unclear   Alcohol abuse Father    Breast cancer Sister 6   Diabetes Brother    Hypothyroidism Daughter    Hypothyroidism Daughter    Breast cancer Other    Colon cancer Neg Hx    Esophageal cancer Neg Hx    Stomach cancer Neg Hx    Pancreatic cancer Neg Hx    Liver disease Neg Hx    Bladder Cancer Neg Hx    Uterine cancer Neg Hx    Rectal cancer Neg Hx      Review of Systems: Review of Systems  Constitutional:  Positive for malaise/fatigue and weight loss. Negative for fever.  Respiratory:  Negative for cough, shortness of breath and wheezing.   Cardiovascular:  Negative for chest pain, palpitations and leg swelling.  Gastrointestinal:  Negative for abdominal pain, blood in stool and constipation.       Leakage  Genitourinary:  Positive for frequency (night time)  and urgency. Negative for dysuria and hematuria.  Skin:  Negative for rash.  Neurological:  Negative for dizziness, weakness and headaches.  Endo/Heme/Allergies:  Does not bruise/bleed easily.  Psychiatric/Behavioral:  Negative for depression. The patient is not nervous/anxious.      OBJECTIVE Physical Exam: Vitals:   10/14/23 1428 10/14/23 1547  BP: (!) 141/74 (!) 171/74  Pulse: 61 73  Weight: 105 lb (47.6 kg)   Height: 5' 1.42" (1.56 m)    Physical Exam Constitutional:      General: She is not in acute distress.    Appearance: Normal appearance.  Genitourinary:     Bladder and urethral meatus normal.     No lesions in the vagina.     Right Labia:  No rash, tenderness, lesions, skin changes or Bartholin's cyst.    Left Labia: No tenderness, lesions, skin changes, Bartholin's cyst or rash.    No vaginal discharge, erythema, tenderness, bleeding, ulceration or granulation tissue.     No vaginal prolapse present.    Mild vaginal atrophy present.     Right Adnexa: not tender, not full and no mass present.    Left Adnexa: not tender, not full and no mass present.    No cervical motion tenderness, discharge, friability, lesion, polyp or nabothian cyst.     Uterus is not enlarged, fixed, tender, irregular or prolapsed.     No uterine mass detected.    Urethral meatus caruncle not present.    No urethral prolapse, tenderness, mass, hypermobility, discharge or stress urinary incontinence with cough stress test present.     Bladder is not tender, urgency on palpation not present and masses not present.      Asymmetrical contractions present (increased on the right).     Levator ani not tender, obturator internus not tender and no pelvic spasms present.    Anal wink absent and BC reflex absent.     Symmetrical pelvic sensation. Cardiovascular:     Rate and Rhythm: Normal rate.  Pulmonary:     Effort: Pulmonary effort is normal. No respiratory distress.  Abdominal:     General:  There is no distension.     Palpations: Abdomen is soft. There is no mass.     Tenderness: There is no abdominal tenderness.     Hernia: No hernia is present.  Neurological:     Mental Status: She is alert.  Vitals reviewed. Exam conducted with a chaperone present.     POP-Q:   POP-Q  -3                                            Aa   -3                                           Ba  -7                                              C   1                                            Gh  2                                            Pb  7                                            tvl   -3  Ap  -3                                            Bp  -7                                              D    Post-Void Residual (PVR) by Bladder Scan: In order to evaluate bladder emptying, we discussed obtaining a postvoid residual and patient agreed to this procedure.  Procedure: The ultrasound unit was placed on the patient's abdomen in the suprapubic region after the patient had voided.    Post Void Residual - 10/14/23 1533       Post Void Residual   Post Void Residual 215 mL   in/out cath           Straight Catheterization Procedure for bladder volume around 1 hr postvoid: After verbal consent was obtained from the patient for catheterization to assess bladder emptying and residual volume the urethra and surrounding tissues were prepped with betadine and an in and out catheterization was performed.  PVR was .  Urine appeared clear yellow. The patient tolerated the procedure well.    Laboratory Results: Lab Results  Component Value Date   COLORU yellow 10/14/2023   CLARITYU clear 10/14/2023   GLUCOSEUR negative 10/14/2023   BILIRUBINUR negative 10/14/2023   KETONESU + 05/10/2023   SPECGRAV 1.010 10/14/2023   RBCUR negative 10/14/2023   PHUR 6.5 10/14/2023   PROTEINUR Positive (A) 05/10/2023   UROBILINOGEN 0.2 10/14/2023    LEUKOCYTESUR Negative 10/14/2023    Lab Results  Component Value Date   CREATININE 0.70 04/25/2023   CREATININE 0.73 05/29/2021   CREATININE 0.71 04/28/2021    Lab Results  Component Value Date   HGBA1C 5.3 07/29/2016    Lab Results  Component Value Date   HGB 12.6 04/25/2023     ASSESSMENT AND PLAN Ms. Andy is a 83 y.o. with:  1. Nocturia   2. Feeling of incomplete bladder emptying   3. Incontinence of feces, unspecified fecal incontinence type   4. Overactive bladder     Nocturia Assessment & Plan: - discussed association with Parkinson's  - avoid fluid intake 3 hours before bedtime - trial of Gemtesa at bedtime  Orders: Seferino Dade; Take 1 tablet (75 mg total) by mouth daily. Seferino Dade; Take 1 tablet (75 mg total) by mouth daily.  Dispense: 30 tablet; Refill: 2 -     AMB referral to rehabilitation  Feeling of incomplete bladder emptying Assessment & Plan: - initial PVR < , catheterized for after 1 hr postvoid - discussed risk of urinary retention with OAB medications, advised pt to start trial after the weekend and return to the office if she experiences signs and symptoms of urinary retention - repeat at follow-up or clinical change - consider urodynamic testing  Orders: -     POCT URINALYSIS DIP (CLINITEK) -     AMB referral to rehabilitation  Incontinence of feces, unspecified fecal incontinence type Assessment & Plan: - FI 5-10x/year, stopped using miralax  daily arund 5-6 months ago - Treatment options include anti-diarrhea medication (loperamide / Imodium  OTC or prescription lomotil), fiber supplements, physical therapy, and possible sacral neuromodulation  or surgery.   - encouraged titration of miralax  due to bloating with fiber supplementation in the past. - referral placed for pelvic floor PT   Overactive bladder Assessment & Plan: - POCT UA negative - denies leakage, reports worsening symptoms with bradykinesia for around 1  year - encouraged bladder training - We discussed the symptoms of overactive bladder (OAB), which include urinary urgency, urinary frequency, nocturia, with or without urge incontinence.  While we do not know the exact etiology of OAB, several treatment options exist. We discussed management including behavioral therapy (decreasing bladder irritants, urge suppression strategies, timed voids, bladder retraining), physical therapy, medication; for refractory cases posterior tibial nerve stimulation, sacral neuromodulation, and intravesical botulinum toxin injection.  For anticholinergic medications, we discussed the potential side effects of anticholinergics including dry eyes, dry mouth, constipation, cognitive impairment and urinary retention. For Beta-3 agonist medication, we discussed the potential side effect of elevated blood pressure which is more likely to occur in individuals with uncontrolled hypertension. - trial of gemtesa with samples and Rx provided - referred to pelvic floor PT - patient and husband desires to avoid 3rd line therapy at this time   Time spent: I spent 66 minutes dedicated to the care of this patient on the date of this encounter to include pre-visit review of records, face-to-face time with the patient discussing feeling of incomplete bladder emptying, fecal incontinence, nocturia, overactive bladder, and post visit documentation and ordering medication/ testing.   Darlene Ehlers, MD

## 2023-10-14 NOTE — Assessment & Plan Note (Signed)
-   discussed association with Parkinson's  - avoid fluid intake 3 hours before bedtime - trial of Gemtesa  at bedtime

## 2023-10-14 NOTE — Assessment & Plan Note (Addendum)
-   initial PVR < , catheterized for after 1 hr postvoid - discussed risk of urinary retention with OAB medications, advised pt to start trial after the weekend and return to the office if she experiences signs and symptoms of urinary retention - repeat at follow-up or clinical change - consider urodynamic testing

## 2023-10-26 ENCOUNTER — Ambulatory Visit (INDEPENDENT_AMBULATORY_CARE_PROVIDER_SITE_OTHER): Payer: Medicare HMO

## 2023-10-26 DIAGNOSIS — I428 Other cardiomyopathies: Secondary | ICD-10-CM

## 2023-10-28 LAB — CUP PACEART REMOTE DEVICE CHECK
Battery Impedance: 2765 Ohm
Battery Remaining Longevity: 24 mo
Battery Voltage: 2.73 V
Brady Statistic AP VP Percent: 18 %
Brady Statistic AP VS Percent: 0 %
Brady Statistic AS VP Percent: 82 %
Brady Statistic AS VS Percent: 0 %
Date Time Interrogation Session: 20250605150706
Implantable Lead Connection Status: 753985
Implantable Lead Connection Status: 753985
Implantable Lead Implant Date: 20131014
Implantable Lead Implant Date: 20131014
Implantable Lead Location: 753859
Implantable Lead Location: 753860
Implantable Lead Model: 5076
Implantable Lead Model: 5076
Implantable Pulse Generator Implant Date: 20131014
Lead Channel Impedance Value: 428 Ohm
Lead Channel Impedance Value: 671 Ohm
Lead Channel Pacing Threshold Amplitude: 0.375 V
Lead Channel Pacing Threshold Amplitude: 0.5 V
Lead Channel Pacing Threshold Pulse Width: 0.4 ms
Lead Channel Pacing Threshold Pulse Width: 0.4 ms
Lead Channel Setting Pacing Amplitude: 2 V
Lead Channel Setting Pacing Amplitude: 2.5 V
Lead Channel Setting Pacing Pulse Width: 0.4 ms
Lead Channel Setting Sensing Sensitivity: 4 mV
Zone Setting Status: 755011
Zone Setting Status: 755011

## 2023-11-01 ENCOUNTER — Other Ambulatory Visit: Payer: Self-pay | Admitting: Neurology

## 2023-11-01 DIAGNOSIS — G903 Multi-system degeneration of the autonomic nervous system: Secondary | ICD-10-CM

## 2023-11-04 ENCOUNTER — Ambulatory Visit: Payer: Self-pay | Admitting: Cardiology

## 2023-11-15 ENCOUNTER — Encounter: Payer: Self-pay | Admitting: Neurology

## 2023-12-15 ENCOUNTER — Other Ambulatory Visit: Payer: Self-pay | Admitting: Neurology

## 2023-12-15 DIAGNOSIS — G20B1 Parkinson's disease with dyskinesia, without mention of fluctuations: Secondary | ICD-10-CM

## 2023-12-15 NOTE — Addendum Note (Signed)
 Addended by: VICCI SELLER A on: 12/15/2023 04:15 PM   Modules accepted: Orders

## 2023-12-15 NOTE — Progress Notes (Signed)
 Remote pacemaker transmission.

## 2023-12-16 ENCOUNTER — Encounter: Payer: Self-pay | Admitting: Obstetrics

## 2023-12-16 ENCOUNTER — Ambulatory Visit: Admitting: Obstetrics

## 2023-12-16 VITALS — BP 136/88

## 2023-12-16 DIAGNOSIS — R159 Full incontinence of feces: Secondary | ICD-10-CM | POA: Diagnosis not present

## 2023-12-16 DIAGNOSIS — R3914 Feeling of incomplete bladder emptying: Secondary | ICD-10-CM

## 2023-12-16 DIAGNOSIS — R351 Nocturia: Secondary | ICD-10-CM

## 2023-12-16 DIAGNOSIS — N3281 Overactive bladder: Secondary | ICD-10-CM | POA: Diagnosis not present

## 2023-12-16 MED ORDER — GEMTESA 75 MG PO TABS: 75.0000 mg | ORAL_TABLET | Freq: Every day | ORAL | Status: DC

## 2023-12-16 MED ORDER — TROSPIUM CHLORIDE ER 60 MG PO CP24: 1.0000 | ORAL_CAPSULE | Freq: Every day | ORAL | 3 refills | Status: DC

## 2023-12-16 MED ORDER — GEMTESA 75 MG PO TABS: 75.0000 mg | ORAL_TABLET | Freq: Every day | ORAL | 11 refills | Status: DC

## 2023-12-16 NOTE — Progress Notes (Signed)
 [facility] Return Visit  SUBJECTIVE  History of Present Illness: Lindsey Bryant is a 83 y.o. female seen in follow-up for incomplete bladder emptying, fecal incontinence, nocturia, and overactive bladder with Parkinson's disease. Plan at last visit was trial of Gemtesa , resume miralax , and pelvic floor PT.   Gemtesa  with improvement of urinary frequency to 5-6x/day, nocturia reduce to 1-2x/night from 2-5x/night. Reports symptomatic relief after 2 days.  Took samples for 2 weeks and then took a break, finished last pill around 3 days ago   Past Medical History: Patient  has a past medical history of Acute systolic heart failure (HCC), Arm fracture, Bradycardia, Hyperglycemia, IBS (irritable bowel syndrome), Melanoma (HCC), NSTEMI (non-ST elevated myocardial infarction) (HCC), Pacemaker-Medtronic (03/07/2012), and Parkinson's disease.   Past Surgical History: She  has a past surgical history that includes Skin cancer destruction (2005); Melanoma excision (Right, 2000); Appendectomy (8047); pacemaker placement (05/2012); permanent pacemaker insertion (N/A, 03/06/2012); pacemaker revision (N/A, 03/14/2012); Colonoscopy; LEFT HEART CATH AND CORONARY ANGIOGRAPHY (N/A, 08/03/2016); and Eye surgery (07/2017).   Medications: She has a current medication list which includes the following prescription(s): aspirin  ec, carbidopa -levodopa , carbidopa -levodopa  er, dicyclomine , estradiol , melatonin, midodrine , polyethylene glycol powder, probiotic product, propylene glycol, simethicone , and vitamin e.   Allergies: Patient is allergic to oxycodone .   Social History: Patient  reports that she has quit smoking. She has never used smokeless tobacco. She reports that she does not drink alcohol and does not use drugs.     OBJECTIVE     Physical Exam: Vitals:   12/16/23 1532  BP: 136/88   Gen: No apparent distress, A&O x 3.  Detailed Urogynecologic Evaluation:  Deferred.   Straight  Catheterization Procedure for PVR: After verbal consent was obtained from the patient for catheterization to assess bladder emptying and residual volume the urethra and surrounding tissues were prepped with betadine and an in and out catheterization was performed.  PVR was .  Urine appeared clear yellow. The patient tolerated the procedure well.  Lab Results  Component Value Date   CREATININE 0.70 04/25/2023       ASSESSMENT AND PLAN    Lindsey Bryant is a 83 y.o. with:  1. Overactive bladder   2. Nocturia   3. Incontinence of feces, unspecified fecal incontinence type   4. Feeling of incomplete bladder emptying     Overactive bladder Assessment & Plan: - POCT UA negative 10/14/23 - denies leakage, reports worsening symptoms with bradykinesia for around 1 year - encouraged bladder training, reports relief with Kegel exercises for night time leakage - symptoms resolved with Gemtesa , however worsening incomplete bladder emptying - We discussed the symptoms of overactive bladder (OAB), which include urinary urgency, urinary frequency, nocturia, with or without urge incontinence.  While we do not know the exact etiology of OAB, several treatment options exist. We discussed management including behavioral therapy (decreasing bladder irritants, urge suppression strategies, timed voids, bladder retraining), physical therapy, medication; for refractory cases posterior tibial nerve stimulation, sacral neuromodulation, and intravesical botulinum toxin injection.  For anticholinergic medications, we discussed the potential side effects of anticholinergics including dry eyes, dry mouth, constipation, cognitive impairment and urinary retention. For Beta-3 agonist medication, we discussed the potential side effect of elevated blood pressure which is more likely to occur in individuals with uncontrolled hypertension. - referred to pelvic floor PT, encouraged pt and her husband to call for appointment -  return for urodynamics - discussed PTNS and sarcal neuromodulation   Nocturia Assessment & Plan: - discussed association  with Parkinson's  - avoid fluid intake 3 hours before bedtime - Gemtesa  with improvement of symptoms, however discontinued due to incomplete bladder emptying - urodynamics scheduled   Incontinence of feces, unspecified fecal incontinence type Assessment & Plan: - FI 5-10x/year, stopped using miralax  daily arund 5-6 months ago - Treatment options include anti-diarrhea medication (loperamide / Imodium  OTC or prescription lomotil), fiber supplements, physical therapy, and possible sacral neuromodulation or surgery.   - encouraged titration of miralax  due to bloating with fiber supplementation in the past. - referral placed for pelvic floor PT provided [phone] to schedule the earliest appointment - discussed sacral neuromodulation    Feeling of incomplete bladder emptying Assessment & Plan: - initial PVR < , catheterized for after 1 hr postvoid in prior visit - PVR by catheterization today, last dose of Gemtesa  3 days ago - stop Gemtesa  - offered [facility] teaching and catheterization, pt and husband declined - discussed urodynamic testing and possible sacral neuromodulation, moderate risk due to neurogenic LUTS with DO and incomplete bladder emptying - Cr 0.7 in 04/25/23 - will discuss renal US  if persistent incomplete bladder emptying   Time spent: I spent 36 minutes dedicated to the care of this patient on the date of this encounter to include pre-visit review of records, face-to-face time with the patient discussing overactive bladder, nocturia, fecal incontinence, incomplete bladder emptying, and post visit documentation and ordering medication/ testing.   Lianne ONEIDA Gillis, MD

## 2023-12-16 NOTE — Assessment & Plan Note (Addendum)
-   POCT UA negative 10/14/23 - denies leakage, reports worsening symptoms with bradykinesia for around 1 year - encouraged bladder training, reports relief with Kegel exercises for night time leakage - symptoms resolved with Gemtesa , however worsening incomplete bladder emptying - We discussed the symptoms of overactive bladder (OAB), which include urinary urgency, urinary frequency, nocturia, with or without urge incontinence.  While we do not know the exact etiology of OAB, several treatment options exist. We discussed management including behavioral therapy (decreasing bladder irritants, urge suppression strategies, timed voids, bladder retraining), physical therapy, medication; for refractory cases posterior tibial nerve stimulation, sacral neuromodulation, and intravesical botulinum toxin injection.  For anticholinergic medications, we discussed the potential side effects of anticholinergics including dry eyes, dry mouth, constipation, cognitive impairment and urinary retention. For Beta-3 agonist medication, we discussed the potential side effect of elevated blood pressure which is more likely to occur in individuals with uncontrolled hypertension. - referred to pelvic floor PT, encouraged pt and her husband to call for appointment - return for urodynamics - discussed PTNS and sarcal neuromodulation

## 2023-12-16 NOTE — Assessment & Plan Note (Addendum)
-   FI 5-10x/year, stopped using miralax  daily arund 5-6 months ago - Treatment options include anti-diarrhea medication (loperamide / Imodium  OTC or prescription lomotil), fiber supplements, physical therapy, and possible sacral neuromodulation or surgery.   - encouraged titration of miralax  due to bloating with fiber supplementation in the past. - referral placed for pelvic floor PT provided [phone] to schedule the earliest appointment - discussed sacral neuromodulation

## 2023-12-16 NOTE — Patient Instructions (Addendum)
 We discussed the symptoms of overactive bladder (OAB), which include urinary urgency, urinary frequency, night-time urination, with or without urge incontinence.  We discussed management including behavioral therapy (decreasing bladder irritants by following a bladder diet, urge suppression strategies, timed voids, bladder retraining), physical therapy, medication; and for refractory cases posterior tibial nerve stimulation, sacral neuromodulation, and intravesical botulinum toxin injection.   Continue titration of miralax  daily to optimize stool consistency.   Please return with a full bladder for urodynamics  Please call [phone] to schedule the earliest appointment for pelvic floor PT.

## 2023-12-16 NOTE — Assessment & Plan Note (Addendum)
-   initial PVR < , catheterized for after 1 hr postvoid in prior visit - PVR by catheterization today, last dose of Gemtesa  3 days ago - stop Gemtesa  - offered [facility] teaching and catheterization, pt and husband declined - discussed urodynamic testing and possible sacral neuromodulation, moderate risk due to neurogenic LUTS with DO and incomplete bladder emptying - Cr 0.7 in 04/25/23 - will discuss renal US  if persistent incomplete bladder emptying

## 2023-12-16 NOTE — Assessment & Plan Note (Addendum)
-   discussed association with Parkinson's  - avoid fluid intake 3 hours before bedtime - Gemtesa  with improvement of symptoms, however discontinued due to incomplete bladder emptying - urodynamics scheduled

## 2023-12-19 NOTE — Progress Notes (Unsigned)
 Assessment/Plan:   1.  Parkinsons Disease, sx's since 2006  -*** carbidopa /levodopa  25/100, 2 po 5 times per day   -*** carbidopa /levodopa  25/100 CR at bedtime.  We tried the carbidopa /levodopa  50/200 CR at bed and she only took 1 dose and felt groggy.    -follows with dermatology one time per year.  She is seeing dermatology specialists of [place].    -We discussed Vyalev, which is foscarbidopa/foslevodopa pump that is newly FDA approved.  We discussed that it is for motor fluctuations in adults with advanced Parkinson's disease.  We discussed that this likely will not be on Medicare formulary until the latter half of 2025.  We discussed risks and benefits of this drug.   -They are looking into retirement communities and we talked about those decisions today.    2.  Neurogenic Orthostatic Hypotension  -Continue midodrine , 5 mg, 1 tablet 3 times per day.  Take an extra in the AM if the SBP <110.  She and I did discuss the concept of permissive hypertension.   3.  Abdominal pain with hx of IBS (constipation and diarrhea)  -follows with Dr. Avram.  Encouraged her to make a f/u appt with him as she is having more trouble  -discussed anticholinergic s/e with bentyl .  She is only using occasionally.  4.  Few episodes MS change  -suspect due to Neurogenic Orthostatic Hypotension  5.  Hx of fall with pathologic fracture of the right humerus  -Patient was following to make sure pathologic fracture was not caused by metastatic disease/hematologic disease, but ultimately refused further work-up by oncology. 6.  Constipation  -Follows with Dr. Avram 7.  Overactive bladder  - Trialed on Gemtesa  which helped all the symptoms for overactive bladder, but seem to worsen incomplete emptying of bladder and the medication was discontinued.  Self catheterization was discussed but declined.  - Patient following with Dr. Guadlupe 8.  Diplopia  - Following with ophthalmology and was apparently told  that it was Parkinson's related.  If so, the only thing to help with prisms. Subjective:   Lindsey Bryant was seen today in follow up for Parkinsons disease.  My previous records were reviewed prior to todays visit as well as outside records available to me.  Patient with spouse who supplements the history.   Last visit, we added low-dose extended release levodopa  at bedtime and she reports that ***.  Last visit, I also referred her to urogynecology and she last saw Dr. Guadlupe on July 25.  Notes are reviewed.  She was trialed on Gemtesa , which helped overactive bladder, but seem to worsen incomplete bladder emptying and the medication was stopped.  Self catheterization was discussed, but declined.  She was referred for pelvic floor PT for bladder as well as bowel.  The patient emailed me at the end of June about double vision and stated she had already followed up with her ophthalmologist.  She stated that the ophthalmologist told her it was from her Parkinson's and she wanted to know what she could do.  I asked her if this was an acute thing or if it was chronic.  I told her if it was chronic but the only thing that could be done was to add prisms to the glasses and the ophthalmologist would be the one to help with that.  Current prescribed movement disorder medications:  Carbidopa /levodopa  25/100, 2 tablets 5 times per day  Carbidopa /levodopa  25/100 CR at bed (started last visit)  Midodrine , 5 mg,  she is taking 1 tablet 3 times per day   Prior medications: Mirtazapine , 7.5 mg at bedtime (I started her on that for sleep and to help weight gain, but her sleep issues were really from nocturia and she stopped it); Myrbetriq ; Mestinon  given for blood pressure by cardiology but never took it; carbidopa /levodopa  50/200 at bed (tried 1 time and felt groggy).    ALLERGIES:   Allergies  Allergen Reactions   Oxycodone  Other (See Comments)    CURRENT MEDICATIONS:  Outpatient Encounter Medications as of  12/20/2023  Medication Sig   aspirin  EC 81 MG tablet Take 81 mg by mouth daily. Swallow whole.   carbidopa -levodopa  (SINEMET  IR) 25-100 MG tablet TAKE 2 TABLETS BY MOUTH AT 6AM/9AM/NOON/3PM/6PM   Carbidopa -Levodopa  ER (SINEMET  CR) 25-100 MG tablet controlled release TAKE 1 TABLET BY MOUTH EVERYDAY AT BEDTIME   dicyclomine  (BENTYL ) 10 MG capsule Take 1 capsule (10 mg total) by mouth 3 (three) times daily between meals as needed for spasms.   estradiol  (ESTRACE  VAGINAL) 0.1 MG/GM vaginal cream Apply as directed 3 times weekly per vagina   melatonin 5 MG TABS Take 5 mg by mouth at bedtime.   midodrine  (PROAMATINE ) 5 MG tablet TAKE 1 TABLET BY MOUTH 3 TIMES PER DAY. TAKE AN EXTRA TABLET IN THE MORNING IF THE SBP <110.   polyethylene glycol powder (GLYCOLAX /MIRALAX ) 17 GM/SCOOP powder Take 34 g by mouth daily.   Probiotic Product (PROBIOTIC PO) Take 1 capsule by mouth daily as needed.   Propylene Glycol (SYSTANE COMPLETE OP) Apply 1 drop to eye daily.   Simethicone  (GAS-X PO) Take 1 tablet by mouth as needed. After meals and at bedtime   VITAMIN E PO Take 1 capsule by mouth daily.   No facility-administered encounter medications on file as of 12/20/2023.    Objective:   PHYSICAL EXAMINATION:    VITALS:   There were no vitals filed for this visit.   GEN:  The patient appears stated age and is in NAD. HEENT:  Normocephalic, atraumatic.  The mucous membranes are moist. The superficial temporal arteries are without ropiness or tenderness. CV:  RRR Lungs:  CTAB   Neurological examination:  Orientation: The patient is alert and oriented x3. Cranial nerves: There is good facial symmetry with mild facial hypomimia. The speech is fluent and clear. Soft palate rises symmetrically and there is no tongue deviation. Hearing is intact to conversational tone. Sensation: Sensation is intact to light touch throughout Motor: Strength is at least antigravity x4.    Movement examination: Tone: There is  nl tone in the ue/le Abnormal movements: there is mild dyskinesia in the legs (took dose about 1.5 hours ago) Coordination:  There is mild decremation on the L Gait and Station: The patient arises well.  No significant shuffling, but she does have slight decreased arm swing on the left.  Slightly off balance.  I have reviewed and interpreted the following labs independently    Chemistry      Component Value Date/Time   NA 137 04/25/2023 1423   NA 141 01/03/2017 1515   K 4.2 04/25/2023 1423   CL 100 04/25/2023 1423   CO2 31 04/25/2023 1423   BUN 15 04/25/2023 1423   BUN 13 01/03/2017 1515   CREATININE 0.70 04/25/2023 1423   CREATININE 0.73 05/29/2021 1514   CREATININE 0.78 12/26/2019 1454      Component Value Date/Time   CALCIUM  9.6 04/25/2023 1423   ALKPHOS 65 04/25/2023 1423   AST 10 04/25/2023  1423   AST 11 (L) 05/29/2021 1514   ALT 2 04/25/2023 1423   ALT <5 05/29/2021 1514   BILITOT 0.6 04/25/2023 1423   BILITOT 0.7 05/29/2021 1514       Lab Results  Component Value Date   WBC 5.7 04/25/2023   HGB 12.6 04/25/2023   HCT 37.8 04/25/2023   MCV 100.7 (H) 04/25/2023   PLT 189.0 04/25/2023    Lab Results  Component Value Date   TSH 1.74 04/25/2023     Total time spent on today's visit was *** minutes, including both face-to-face time and nonface-to-face time.  Time included that spent on review of records (prior notes available to me/labs/imaging if pertinent), discussing treatment and goals, answering patient's questions and coordinating care.  Cc:  Micheal Wolm ORN, MD

## 2023-12-20 ENCOUNTER — Ambulatory Visit: Payer: Medicare HMO | Admitting: Neurology

## 2023-12-20 ENCOUNTER — Encounter: Payer: Self-pay | Admitting: Neurology

## 2023-12-20 VITALS — BP 110/68 | HR 68 | Ht 62.0 in | Wt 104.6 lb

## 2023-12-20 DIAGNOSIS — G903 Multi-system degeneration of the autonomic nervous system: Secondary | ICD-10-CM

## 2023-12-20 DIAGNOSIS — R5383 Other fatigue: Secondary | ICD-10-CM | POA: Diagnosis not present

## 2023-12-20 DIAGNOSIS — N3281 Overactive bladder: Secondary | ICD-10-CM | POA: Diagnosis not present

## 2023-12-20 DIAGNOSIS — G20B1 Parkinson's disease with dyskinesia, without mention of fluctuations: Secondary | ICD-10-CM | POA: Diagnosis not present

## 2023-12-20 MED ORDER — MIDODRINE HCL 5 MG PO TABS: ORAL_TABLET | ORAL | 1 refills | Status: DC

## 2023-12-20 NOTE — Patient Instructions (Addendum)
 Increase midodrine  to 10 mg with breakfast, 10 mg with lunch and 5 mg at dinner  SAVE THE DATE!  We are planning a Parkinsons Disease educational symposium at [facility], [address] [place], [place], [place] [address] on September 19.  We will have a movement disorder physician expert from [employer] coming to speak and a caregiver speaker.  We will have a panel of experts that will show you who you may need on your team of people on your journey with Parkinsons.  I hope to see you there!  Use this QR code to register by scanning it with the camera app on your phone:      Need more help with registration?  Contact [email or web address]

## 2023-12-21 ENCOUNTER — Encounter: Payer: Self-pay | Admitting: Obstetrics

## 2024-01-10 ENCOUNTER — Ambulatory Visit: Attending: Obstetrics | Admitting: Physical Therapy

## 2024-01-10 ENCOUNTER — Other Ambulatory Visit: Payer: Self-pay

## 2024-01-10 ENCOUNTER — Encounter (INDEPENDENT_AMBULATORY_CARE_PROVIDER_SITE_OTHER): Admitting: Family Medicine

## 2024-01-10 ENCOUNTER — Encounter: Payer: Self-pay | Admitting: Physical Therapy

## 2024-01-10 DIAGNOSIS — M6281 Muscle weakness (generalized): Secondary | ICD-10-CM | POA: Insufficient documentation

## 2024-01-10 DIAGNOSIS — R252 Cramp and spasm: Secondary | ICD-10-CM | POA: Insufficient documentation

## 2024-01-10 DIAGNOSIS — R279 Unspecified lack of coordination: Secondary | ICD-10-CM | POA: Insufficient documentation

## 2024-01-10 NOTE — Progress Notes (Signed)
 error

## 2024-01-10 NOTE — Therapy (Signed)
 OUTPATIENT PHYSICAL THERAPY FEMALE PELVIC EVALUATION   Patient Name: TAREA SKILLMAN MRN: [id number] DOB:Sep 27, 1940, 83 y.o., female Today's Date: 01/10/2024  END OF SESSION:  PT End of Session - 01/10/24 1511     Visit Number 1    Date for PT Re-Evaluation 04/03/24    Authorization Type aetna [id number]  no auth req    PT Start Time 1500    PT Stop Time 1540    PT Time Calculation (min) 40 min    Activity Tolerance Patient tolerated treatment well    Behavior During Therapy [facility] for tasks assessed/performed          Past Medical History:  Diagnosis Date   Acute systolic heart failure (HCC)    Arm fracture    Bradycardia    a. 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   Parkinson's disease    Past Surgical History:  Procedure Laterality Date   APPENDECTOMY  1952   COLONOSCOPY     EYE SURGERY  07/2017   Cataract Sx   LEFT HEART CATH AND CORONARY ANGIOGRAPHY N/A 08/03/2016   Procedure: Left Heart Cath and Coronary Angiography;  Surgeon: Alm LELON Clay, MD;  Location: [facility] INVASIVE CV LAB;  Service: Cardiovascular;  Laterality: N/A;   MELANOMA EXCISION Right 2000   right arm   PACEMAKER PLACEMENT  05/2012   PACEMAKER REVISION N/A 03/14/2012   Procedure: PACEMAKER REVISION;  Surgeon: Lynwood Rakers, MD;  Location: [facility] CATH LAB;  Service: Cardiovascular;  Laterality: N/A;   PERMANENT PACEMAKER INSERTION N/A 03/06/2012   Procedure: PERMANENT PACEMAKER INSERTION;  Surgeon: Elspeth JAYSON Sage, MD;  Location: [facility] CATH LAB;  Service: Cardiovascular;  Laterality: N/A;   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   Patient Active Problem List   Diagnosis Date Noted   Nocturia 10/14/2023   Feeling of incomplete bladder emptying 10/14/2023   Incontinence of feces 10/14/2023   Overactive bladder 10/14/2023   Macrocytic anemia 07/15/2021    Pathologic fracture of right humerus 05/31/2021   NICM (nonischemic cardiomyopathy) (HCC) 08/25/2020   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  PCP: Micheal Wolm LELON, MD  REFERRING PROVIDER: Guadlupe Lianne DASEN, MD  REFERRING DIAG: G20.A1 (ICD-10-CM) - Parkinson's disease, unspecified whether dyskinesia present, unspecified whether manifestations fluctuate (HCC) R35.1 (ICD-10-CM) - Nocturia R39.14 (ICD-10-CM) - Feeling of incomplete bladder emptying  THERAPY DIAG:  Muscle weakness (generalized)  Cramp and spasm  Unspecified lack of coordination  Rationale for Evaluation and Treatment: Rehabilitation  ONSET DATE: 5 days  SUBJECTIVE:  SUBJECTIVE STATEMENT: Patient reports that she has to go to the bathroom every half hour, she is not emptying. Has to keep going. This has been going on for 5 days, she thought she has an infection.  She has been to urology and they took a lot of measurements She has to pee every 30 minutes, it is exhausting.  Gets up 3 times at night to pee.    PAIN:  Are you having pain? No   PRECAUTIONS: pacemaker  RED FLAGS: None   WEIGHT BEARING RESTRICTIONS: No  FALLS:  Has patient fallen in last 6 months? Yes. Number of falls 1-  hit her left ribs in the kitchen 3 weeks ago- her blood pressure drops and she passes out  OCCUPATION: retired  ACTIVITY LEVEL : walks, used to play tennis  PLOF: Independent  PATIENT GOALS: to urinate no more then once/ hour  PERTINENT HISTORY:  Parkinson's  Sexual abuse: No  BOWEL MOVEMENT:no issues   URINATION: Pain with urination: No Fully empty bladder: No Stream: Weak Urgency: Yes  Frequency: yes every hour Fluid Intake: water, tea Leakage: none Pads: No  INTERCOURSE:not active since about a year ago   PREGNANCY:  Vaginal deliveries 2 Tearing Yes:   Episiotomy No C-section deliveries no Currently pregnant No  PROLAPSE: None   OBJECTIVE:  Note: Objective measures were completed at Evaluation unless otherwise noted.   PATIENT SURVEYS:   PFIQ-7: 19  COGNITION: Overall cognitive status: Within functional limits for tasks assessed     SENSATION: Light touch: Appears intact   FUNCTIONAL TESTS:  Squat: genu valgum Single leg stance: decreased balance   Curl-up test:able to    GAIT: Assistive device utilized: None Comments:   POSTURE: rounded shoulders, forward head, decreased lumbar lordosis, and increased thoracic kyphosis   LUMBARAROM/PROM:  A/PROM A/PROM  Eval (% available)  Flexion 75  Extension 75  Right lateral flexion 75  Left lateral flexion 75  Right rotation 75  Left rotation 75   (Blank rows = not tested)  PALPATION:   General: very tender left lower rib  Pelvic Alignment: even  Abdominal: restrictions around bladder                External Perineal Exam: deferred                             Internal Pelvic Floor: deferred  Patient confirms identification and approves PT to assess internal pelvic floor and treatment No  PELVIC MMT:   MMT eval  Vaginal   Internal Anal Sphincter   External Anal Sphincter   Puborectalis   Diastasis Recti no  (Blank rows = not tested)         TONE: deferred  PROLAPSE: deferred  TODAY'S TREATMENT:  DATE:  01/10/2024    PATIENT EDUCATION/ there acts Education details:Pt was educated on relevant anatomy, exam findings, home exercise program, plan of care, expectations of PT and internal muscle assessment  Person educated: Patient Education method: Explanation, Demonstration, Tactile cues, Verbal cues, and Handouts Education comprehension: verbalized understanding  HOME EXERCISE PROGRAM: Access Code: [email or web address] URL: [email or web address] Date: 01/10/2024 Prepared by: Cori Keegan Bensch  Patient Education - Bladder Retraining Strategies - Lifestyle Changes to Support Urinary and Bladder Health - Urinary Urge Control Techniques - Urinary Urge Control Techniques - Get To Know Your Pelvic Floor- Female  ASSESSMENT:  CLINICAL IMPRESSION: Patient 20 minutes late for her eval- partial eval completed today. Patient is a 83 y.o. F who was seen today for physical therapy evaluation and treatment for difficulty with bladder emptying. Patient did not want internal pelvic floor muscle assessment this date however reported that she has been trying to do a lot of Kegels for the last year because they make her feel less urgency. Her daughter told her not do do them since they can make urination difficult. Patient reported that she has difficulty emptying her bladder, has to sit on the toilet for a while, has to run water. She has not tried deep breathing on the toilet yet, discussed adding rocking as well so she can drain the bladder better. Recently fell, patient with decreased balance and coordination due to Parkinson's disease.   OBJECTIVE IMPAIRMENTS: decreased activity tolerance, decreased coordination, decreased endurance, decreased mobility, decreased ROM, decreased strength, increased fascial  restrictions, increased muscle spasms, impaired flexibility, impaired tone, improper body mechanics, postural dysfunction, and pain.   ACTIVITY LIMITATIONS: lifting, sitting, standing, stairs, transfers, locomotion level, and caring for others  PARTICIPATION LIMITATIONS: meal prep, cleaning, laundry, personal finances, interpersonal relationship, driving, shopping, community activity, and yard work  PERSONAL FACTORS: Age, Behavior pattern, and 1-2 comorbidities: Parkinson's, heart disease are also affecting patient's functional outcome.   REHAB POTENTIAL: Fair    CLINICAL DECISION MAKING: Evolving/moderate complexity  EVALUATION COMPLEXITY: Moderate   GOALS: Goals reviewed with patient? Yes  SHORT TERM GOALS: Target date: 02/07/2024    Pt will be independent with HEP.   Baseline: Goal status: INITIAL  2.  Patient will be educated on urge suppression techniques Baseline:  Goal status: INITIAL  3.  Patient will be educated on healthy bladder habits and fill out a bladder diary Baseline:  Goal status: INITIAL  4.  Patient will be educated on double voiding Baseline:  Goal status: INITIAL   LONG TERM GOALS: Target date: 04/03/2024    Pt will be independent with advanced HEP.   Baseline:  Goal status: INITIAL  2.  Patient will have at least 2-3 hours between voids Baseline:  Goal status: INITIAL  3.  Patient will teach back healthy bladder habits Baseline:  Goal status: INITIAL  4.  Patient will get up max 1 time at night to urinate Baseline:  Goal status: INITIAL  5.  Patient will be I with urge suppression strategies Baseline:  Goal status: INITIAL   PLAN:  PT FREQUENCY: 1-2x/week  PT DURATION: 3 months   PLANNED INTERVENTIONS: 97164- PT Re-evaluation, 97110-Therapeutic exercises, 97530- Therapeutic activity, 97112- Neuromuscular re-education, 97535- Self Care, [id number]- Manual therapy, [phone]- Gait training, [phone]- Aquatic Therapy, [phone]- Electrical  stimulation (unattended), [phone]- Traction (mechanical), [id number]- Ionotophoresis 4mg /ml Dexamethasone, 79439 (1-2 muscles), 20561 (3+ muscles)- Dry Needling, Patient/Family education, Balance training, Taping, Joint mobilization, Joint manipulation, Spinal manipulation, Spinal mobilization, Scar mobilization, Vestibular training, Cryotherapy, Moist heat, and Biofeedback  PLAN FOR NEXT SESSION: internal pelvic floor assessment if Ok with patient, urge suppression techniques, bladder diary  Pesach Frisch, PT 01/10/2024, 5:23 PM

## 2024-01-13 ENCOUNTER — Other Ambulatory Visit: Payer: Self-pay | Admitting: Neurology

## 2024-01-13 ENCOUNTER — Other Ambulatory Visit: Payer: Self-pay | Admitting: Family Medicine

## 2024-01-13 DIAGNOSIS — G903 Multi-system degeneration of the autonomic nervous system: Secondary | ICD-10-CM

## 2024-01-16 DIAGNOSIS — H524 Presbyopia: Secondary | ICD-10-CM | POA: Diagnosis not present

## 2024-01-16 DIAGNOSIS — H26493 Other secondary cataract, bilateral: Secondary | ICD-10-CM | POA: Diagnosis not present

## 2024-01-25 ENCOUNTER — Ambulatory Visit: Payer: Medicare HMO

## 2024-01-25 ENCOUNTER — Encounter: Admitting: Obstetrics and Gynecology

## 2024-01-25 DIAGNOSIS — I428 Other cardiomyopathies: Secondary | ICD-10-CM | POA: Diagnosis not present

## 2024-01-26 LAB — CUP PACEART REMOTE DEVICE CHECK
Battery Impedance: 3077 Ohm
Battery Remaining Longevity: 21 mo
Battery Voltage: 2.72 V
Brady Statistic AP VP Percent: 20 %
Brady Statistic AP VS Percent: 0 %
Brady Statistic AS VP Percent: 80 %
Brady Statistic AS VS Percent: 0 %
Date Time Interrogation Session: 20250903195310
Implantable Lead Connection Status: 753985
Implantable Lead Connection Status: 753985
Implantable Lead Implant Date: 20131014
Implantable Lead Implant Date: 20131014
Implantable Lead Location: 753859
Implantable Lead Location: 753860
Implantable Lead Model: 5076
Implantable Lead Model: 5076
Implantable Pulse Generator Implant Date: 20131014
Lead Channel Impedance Value: 401 Ohm
Lead Channel Impedance Value: 706 Ohm
Lead Channel Pacing Threshold Amplitude: 0.5 V
Lead Channel Pacing Threshold Amplitude: 0.5 V
Lead Channel Pacing Threshold Pulse Width: 0.4 ms
Lead Channel Pacing Threshold Pulse Width: 0.4 ms
Lead Channel Setting Pacing Amplitude: 2 V
Lead Channel Setting Pacing Amplitude: 2.5 V
Lead Channel Setting Pacing Pulse Width: 0.4 ms
Lead Channel Setting Sensing Sensitivity: 4 mV
Zone Setting Status: 755011
Zone Setting Status: 755011

## 2024-01-27 ENCOUNTER — Ambulatory Visit: Payer: Self-pay | Admitting: Cardiology

## 2024-02-03 DIAGNOSIS — Z0100 Encounter for examination of eyes and vision without abnormal findings: Secondary | ICD-10-CM | POA: Diagnosis not present

## 2024-02-04 NOTE — Progress Notes (Signed)
 Remote PPM Transmission

## 2024-02-10 ENCOUNTER — Ambulatory Visit: Admitting: Obstetrics

## 2024-02-20 ENCOUNTER — Ambulatory Visit: Attending: Obstetrics | Admitting: Physical Therapy

## 2024-02-20 DIAGNOSIS — M6281 Muscle weakness (generalized): Secondary | ICD-10-CM | POA: Insufficient documentation

## 2024-02-20 DIAGNOSIS — R279 Unspecified lack of coordination: Secondary | ICD-10-CM | POA: Diagnosis not present

## 2024-02-20 NOTE — Patient Instructions (Addendum)
 Urge Incontinence  Ideal urination frequency is every 2-4 wakeful hours, which equates to 5-8 times within a 24-hour period.   Urge incontinence is leakage that occurs when the bladder muscle contracts, creating a sudden need to go before getting to the bathroom.   Going too often when your bladder isn't actually full can disrupt the body's automatic signals to store and hold urine longer, which will increase urgency/frequency.  In this case, the bladder "is running the show" and strategies can be learned to retrain this pattern.   One should be able to control the first urge to urinate, at around .  The bladder can hold up to a "grande latte," or . To help you gain control, practice the Urge Drill below when urgency strikes.  This drill will help retrain your bladder signals and allow you to store and hold urine longer.  The overall goal is to stretch out your time between voids to reach a more manageable voiding schedule.    Practice your quick flicks often throughout the day (each waking hour) even when you don't need feel the urge to go.  This will help strengthen your pelvic floor muscles, making them more effective in controlling leakage.  Urge Drill  When you feel an urge to go, follow these steps to regain control: Stop what you are doing and be still Take one deep breath, directing your air into your abdomen Think an affirming thought, such as "I've got this." Do 5 quick flicks of your pelvic floor Walk with control to the bathroom to void, or delay voiding   Bladder Irritants  Certain foods and beverages can be irritating to the bladder.  Avoiding these irritants may decrease your symptoms of urinary urgency, frequency or bladder pain.  Even reducing your intake can help with your symptoms.  Not everyone is sensitive to all bladder irritants, so you may consider focusing on one irritant at a time, removing or reducing your intake of that irritant for 7-10 days to see if  this change helps your symptoms.  Water intake is also very important.  Below is a list of bladder irritants.  Drinks: alcohol, carbonated beverages, caffeinated beverages such as coffee and tea, drinks with artificial sweeteners, citrus juices, apple juice, tomato juice  Foods: tomatoes and tomato based foods, spicy food, sugar and artificial sweeteners, vinegar, chocolate, raw onion, apples, citrus fruits, pineapple, cranberries, tomatoes, strawberries, plums, peaches, cantaloupe  Other: acidic urine (too concentrated) - see water intake info below  Substitutes you can try that are NOT irritating to the bladder: cooked onion, pears, papayas, sun-brewed decaf teas, watermelons, non-citrus herbal teas, apricots, kava and low-acid instant drinks (Postum).    WATER INTAKE: Remember to drink lots of water (aim for fluid intake of half your body weight with 2/3 of fluids being water).  You may be limiting fluids due to fear of leakage, but this can actually worsen urgency symptoms due to highly concentrated urine.  Water helps balance the pH of your urine so it doesn't become too acidic - acidic urine is a bladder irritant!    5 mins while laying down, right to left from right hip to right ribs left rib to left hip and start over for 5 mins. Gentle circles shouldn't be pain. Please do twice daily morning and evening.  Types of Fiber  There are two main types of fiber:  insoluble and soluble.  Both of these types can prevent and relieve constipation and diarrhea, although some people find  one or the other to be more easily digested.  This handout details information about both types of fiber. recommended 25-35 grams of fiber per day,  average 9-12 grams per meal   key is a balance between soluble and insoluble  Insoluble Fiber        Functions of Insoluble Fiber moves bulk through the intestines  controls and balances the pH (acidity) in the intestines   This type of fiber should be avoided  or reduced if you have soft, frequent bowel movements or leakage      Benefits of Insoluble Fiber promotes regular bowel movement and prevents constipation  removes fecal waste through colon in less time  keeps an optimal pH in intestines to prevent microbes from producing cancer substances, therefore preventing colon cancer        Food Sources of Insoluble Fiber whole-wheat products  wheat bran "miller's bran" corn bran  flax seed or other seeds vegetables such as green beans, broccoli, cauliflower and potato skins  fruit skins and root vegetable skins  popcorn brown rice  Soluble Fiber( Types 5,6,7)       Functions of Soluble Fiber  holds water in the colon to bulk and soften the stool prolongs stomach emptying time so that sugar is released and absorbed more slowly  prevent leakage associated with soft, frequent bowel movements.        Benefits of Soluble Fiber lowers total cholesterol and LDL cholesterol (the bad cholesterol) therefore reducing the risk of heart disease  regulates blood sugar for people with diabetes       Food Sources of Soluble Fiber oat/oat bran dried beans and peas  nuts  barley  flax seed or other seeds fruits such as oranges, pears, peaches, and apples  vegetables such as carrots  psyllium husk  prunes

## 2024-02-20 NOTE — Therapy (Signed)
 OUTPATIENT PHYSICAL THERAPY FEMALE PELVIC TREATMENT   Patient Name: Lindsey Bryant MRN: [id number] DOB:January 21, 1941, 83 y.o., female Today's Date: 02/20/2024  END OF SESSION:  PT End of Session - 02/20/24 1401     Visit Number 2    Date for Recertification  04/03/24    Authorization Type aetna mcr [id number]  no auth req    PT Start Time 1400    PT Stop Time 1440    PT Time Calculation (min) 40 min    Activity Tolerance Patient tolerated treatment well    Behavior During Therapy [facility] for tasks assessed/performed          Past Medical History:  Diagnosis Date   Acute systolic heart failure (HCC)    Arm fracture    Bradycardia    a. 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   Parkinson's disease    Past Surgical History:  Procedure Laterality Date   APPENDECTOMY  1952   COLONOSCOPY     EYE SURGERY  07/2017   Cataract Sx   LEFT HEART CATH AND CORONARY ANGIOGRAPHY N/A 08/03/2016   Procedure: Left Heart Cath and Coronary Angiography;  Surgeon: Alm LELON Clay, MD;  Location: [facility] INVASIVE CV LAB;  Service: Cardiovascular;  Laterality: N/A;   MELANOMA EXCISION Right 2000   right arm   PACEMAKER PLACEMENT  05/2012   PACEMAKER REVISION N/A 03/14/2012   Procedure: PACEMAKER REVISION;  Surgeon: Lynwood Rakers, MD;  Location: [facility] CATH LAB;  Service: Cardiovascular;  Laterality: N/A;   PERMANENT PACEMAKER INSERTION N/A 03/06/2012   Procedure: PERMANENT PACEMAKER INSERTION;  Surgeon: Elspeth JAYSON Sage, MD;  Location: [facility] CATH LAB;  Service: Cardiovascular;  Laterality: N/A;   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   Patient Active Problem List   Diagnosis Date Noted   Nocturia 10/14/2023   Feeling of incomplete bladder emptying 10/14/2023   Incontinence of feces 10/14/2023   Overactive bladder 10/14/2023   Macrocytic anemia 07/15/2021    Pathologic fracture of right humerus 05/31/2021   NICM (nonischemic cardiomyopathy) (HCC) 08/25/2020   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  PCP: Micheal Wolm LELON, MD  REFERRING PROVIDER: Guadlupe Lianne DASEN, MD  REFERRING DIAG: G20.A1 (ICD-10-CM) - Parkinson's disease, unspecified whether dyskinesia present, unspecified whether manifestations fluctuate (HCC) R35.1 (ICD-10-CM) - Nocturia R39.14 (ICD-10-CM) - Feeling of incomplete bladder emptying  THERAPY DIAG:  Muscle weakness (generalized)  Unspecified lack of coordination  Rationale for Evaluation and Treatment: Rehabilitation  ONSET DATE: 5 days  SUBJECTIVE:  SUBJECTIVE STATEMENT: Reports she has been getting up to urinating every 1 hour during the night and day now. Reports bowel movement are really a big problem. Does take miralax , but will have loose stools.      PAIN:  Are you having pain? No   PRECAUTIONS: pacemaker, parkinson's, fall risk, bradykinesia   RED FLAGS: None   WEIGHT BEARING RESTRICTIONS: No  FALLS:  Has patient fallen in last 6 months? Yes. Number of falls 1- hit her left ribs in the kitchen 3 weeks ago- her blood pressure drops and she passes out  OCCUPATION:  retired  ACTIVITY LEVEL : walks, used to play tennis  PLOF: Independent  PATIENT GOALS: to urinate no more then once/ hour  PERTINENT HISTORY:  Parkinson's  Sexual abuse: No  BOWEL MOVEMENT:no issues   URINATION: Pain with urination: No Fully empty bladder: No Stream: Weak Urgency: Yes  Frequency: yes every hour Fluid Intake: water, tea Leakage: none Pads: No  INTERCOURSE:not active since about a year ago   PREGNANCY:  Vaginal deliveries 2 Tearing Yes:   Episiotomy No C-section deliveries no Currently pregnant No  PROLAPSE: None   OBJECTIVE:  Note: Objective measures were completed at Evaluation unless otherwise noted.   PATIENT SURVEYS:   PFIQ-7: 19  COGNITION: Overall cognitive status: Within functional limits for tasks assessed     SENSATION: Light touch: Appears intact   FUNCTIONAL TESTS:  Squat: genu valgum Single leg stance: decreased balance   Curl-up test:able to    GAIT: Assistive device utilized: None Comments:   POSTURE: rounded shoulders, forward head, decreased lumbar lordosis, and increased thoracic kyphosis   LUMBARAROM/PROM:  A/PROM A/PROM  Eval (% available)  Flexion 75  Extension 75  Right lateral flexion 75  Left lateral flexion 75  Right rotation 75  Left rotation 75   (Blank rows = not tested)  PALPATION:   General: very tender left lower rib  Pelvic Alignment: even  Abdominal: restrictions around bladder                External Perineal Exam: deferred                             Internal Pelvic Floor: deferred  Patient confirms identification and approves PT to assess internal pelvic floor and treatment No  PELVIC MMT:   MMT eval  Vaginal   Internal Anal Sphincter   External Anal Sphincter   Puborectalis   Diastasis Recti no  (Blank rows = not tested)        TONE: deferred  PROLAPSE: deferred  TODAY'S TREATMENT:                                                                                                                               DATE:  02/20/24; Pt educated on urge drill, she didn't remember this from eval and was reviewed today with new handout  given. Pt reviewed it as well, all questions answered. Initially to direct pt in decreased nighttime voids as she is waking every hour to urinate at night.  Pt educated on step stool and relaxing breathing to empty with urine output and double voiding Declined internal assessment at this, was educated on it but declined. Pt educated on fiber types, and abdominal massage for improved bowel regularity      PATIENT EDUCATION/ there acts Education details:Pt was educated on relevant anatomy, exam findings, home exercise program, plan of care, expectations of PT and internal muscle assessment  Person educated: Patient Education method: Explanation, Demonstration, Tactile cues, Verbal cues, and Handouts Education comprehension: verbalized understanding  HOME EXERCISE PROGRAM: Access Code: [email or web address] URL: [email or web address] Date: 01/10/2024 Prepared by: Cori Helmus  Patient Education - Bladder Retraining Strategies - Lifestyle Changes to Support Urinary and Bladder Health - Urinary Urge Control Techniques - Urinary Urge Control Techniques - Get To Know Your Pelvic Floor- Female  ASSESSMENT:  CLINICAL IMPRESSION: Pt presents for skilled PT treatment for urinary incontinence and constipation. Pt reports no change since eval but also reports she doesn't remember education provided at eval and requested to review this today. This was completed in addition to abdominal massage and voiding mechanics. Pt denied additional questions and given handouts for all. With pt continuing to have deficits with bowel and bladder function Pt would benefit from additional PT to further address deficits.    OBJECTIVE IMPAIRMENTS: decreased activity tolerance, decreased coordination, decreased endurance, decreased mobility,  decreased ROM, decreased strength, increased fascial restrictions, increased muscle spasms, impaired flexibility, impaired tone, improper body mechanics, postural dysfunction, and pain.   ACTIVITY LIMITATIONS: lifting, sitting, standing, stairs, transfers, locomotion level, and caring for others  PARTICIPATION LIMITATIONS: meal prep, cleaning, laundry, personal finances, interpersonal relationship, driving, shopping, community activity, and yard work  PERSONAL FACTORS: Age, Behavior pattern, and 1-2 comorbidities: Parkinson's, heart disease are also affecting patient's functional outcome.   REHAB POTENTIAL: Fair    CLINICAL DECISION MAKING: Evolving/moderate complexity  EVALUATION COMPLEXITY: Moderate   GOALS: Goals reviewed with patient? Yes  SHORT TERM GOALS: Target date: 02/07/2024    Pt will be independent with HEP.   Baseline: Goal status: INITIAL  2.  Patient will be educated on urge suppression techniques Baseline:  Goal status: INITIAL  3.  Patient will be educated on healthy bladder habits and fill out a bladder diary Baseline:  Goal status: INITIAL  4.  Patient will be educated on double voiding Baseline:  Goal status: INITIAL   LONG TERM GOALS: Target date: 04/03/2024    Pt will be independent with advanced HEP.   Baseline:  Goal status: INITIAL  2.  Patient will have at least 2-3 hours between voids Baseline:  Goal status: INITIAL  3.  Patient will teach back healthy bladder habits Baseline:  Goal status: INITIAL  4.  Patient will get up max 1 time at night to urinate Baseline:  Goal status: INITIAL  5.  Patient will be I with urge suppression strategies Baseline:  Goal status: INITIAL   PLAN:  PT FREQUENCY: 1-2x/week  PT DURATION: 3 months   PLANNED INTERVENTIONS: 97164- PT Re-evaluation, 97110-Therapeutic exercises, 97530- Therapeutic activity, 97112- Neuromuscular re-education, 97535- Self Care, 02859- Manual therapy, [phone]- Gait  training, [phone]- Aquatic Therapy, [phone]- Electrical stimulation (unattended), [phone]- Traction (mechanical), [id number]- Ionotophoresis 4mg /ml Dexamethasone, 79439 (1-2 muscles), 20561 (3+ muscles)- Dry Needling, Patient/Family education, Balance training, Taping, Joint mobilization, Joint manipulation, Spinal manipulation, Spinal mobilization, Scar mobilization, Vestibular training, Cryotherapy,  Moist heat, and Biofeedback  PLAN FOR NEXT SESSION: internal pelvic floor assessment if Ok with patient, urge suppression techniques, bladder diary   Darryle Navy, PT, DPT 09/29/253:34 PM  [facility] [address] [place], [place] [address] Phone # [phone] Fax [phone]

## 2024-03-06 ENCOUNTER — Encounter: Payer: Self-pay | Admitting: Physical Therapy

## 2024-03-06 ENCOUNTER — Ambulatory Visit: Attending: Obstetrics | Admitting: Physical Therapy

## 2024-03-06 DIAGNOSIS — R252 Cramp and spasm: Secondary | ICD-10-CM | POA: Diagnosis not present

## 2024-03-06 DIAGNOSIS — R279 Unspecified lack of coordination: Secondary | ICD-10-CM | POA: Diagnosis not present

## 2024-03-06 DIAGNOSIS — M6281 Muscle weakness (generalized): Secondary | ICD-10-CM | POA: Diagnosis not present

## 2024-03-06 DIAGNOSIS — M25511 Pain in right shoulder: Secondary | ICD-10-CM | POA: Diagnosis not present

## 2024-03-06 DIAGNOSIS — M25611 Stiffness of right shoulder, not elsewhere classified: Secondary | ICD-10-CM | POA: Diagnosis not present

## 2024-03-06 NOTE — Therapy (Signed)
 OUTPATIENT PHYSICAL THERAPY FEMALE PELVIC TREATMENT   Patient Name: Lindsey Bryant MRN: [id number] DOB:Jun 13, 1940, 83 y.o., female Today's Date: 03/06/2024  END OF SESSION:  PT End of Session - 03/06/24 1533     Visit Number 3    Date for Recertification  04/03/24    Authorization Type aetna mcr 2025  no auth req    PT Start Time 1533    PT Stop Time 1616    PT Time Calculation (min) 43 min    Activity Tolerance Patient tolerated treatment well    Behavior During Therapy [facility] for tasks assessed/performed          Past Medical History:  Diagnosis Date   Acute systolic heart failure (HCC)    Arm fracture    Bradycardia    a. 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   Parkinson's disease    Past Surgical History:  Procedure Laterality Date   APPENDECTOMY  1952   COLONOSCOPY     EYE SURGERY  07/2017   Cataract Sx   LEFT HEART CATH AND CORONARY ANGIOGRAPHY N/A 08/03/2016   Procedure: Left Heart Cath and Coronary Angiography;  Surgeon: Alm LELON Clay, MD;  Location: [facility] INVASIVE CV LAB;  Service: Cardiovascular;  Laterality: N/A;   MELANOMA EXCISION Right 2000   right arm   PACEMAKER PLACEMENT  05/2012   PACEMAKER REVISION N/A 03/14/2012   Procedure: PACEMAKER REVISION;  Surgeon: Lynwood Rakers, MD;  Location: [facility] CATH LAB;  Service: Cardiovascular;  Laterality: N/A;   PERMANENT PACEMAKER INSERTION N/A 03/06/2012   Procedure: PERMANENT PACEMAKER INSERTION;  Surgeon: Elspeth JAYSON Sage, MD;  Location: [facility] CATH LAB;  Service: Cardiovascular;  Laterality: N/A;   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   Patient Active Problem List   Diagnosis Date Noted   Nocturia 10/14/2023   Feeling of incomplete bladder emptying 10/14/2023   Incontinence of feces 10/14/2023   Overactive bladder 10/14/2023   Macrocytic anemia 07/15/2021    Pathologic fracture of right humerus 05/31/2021   NICM (nonischemic cardiomyopathy) (HCC) 08/25/2020   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  PCP: Micheal Wolm LELON, MD  REFERRING PROVIDER: Guadlupe Lianne DASEN, MD  REFERRING DIAG: G20.A1 (ICD-10-CM) - Parkinson's disease, unspecified whether dyskinesia present, unspecified whether manifestations fluctuate (HCC) R35.1 (ICD-10-CM) - Nocturia R39.14 (ICD-10-CM) - Feeling of incomplete bladder emptying  THERAPY DIAG:  Muscle weakness (generalized)  Unspecified lack of coordination  Cramp and spasm  Stiffness of right shoulder, not elsewhere classified  Acute pain of right shoulder  Rationale for Evaluation and Treatment: Rehabilitation  ONSET DATE: 2024  SUBJECTIVE:  SUBJECTIVE STATEMENT: Patient reports that she gets up every 2-3 hours at night. Patient reports that she needs to give herself longer to urinate.  She falls in in the bathroom due to low blood pressure, has a walker. But does not use it very much at home, bathroom is close. She urinates during the day every hour.  Patient does not want internal.  Does kegels when she is in bed and it delays the urge Bowels are not a big problem today, diet  helps a lot Parkinsons makes her tired, bothers her the most Drinks about 20 oz/ day   Reports she has been getting up to urinating every 1 hour during the night and day now. Reports bowel movement are really a big problem. Does take miralax , but will have loose stools.      PAIN:  Are you having pain? No   PRECAUTIONS: pacemaker, parkinson's, fall risk, bradykinesia   RED FLAGS: None   WEIGHT BEARING RESTRICTIONS: No  FALLS:  Has patient fallen in last 6 months? Yes. Number of falls 1- hit her left ribs in the kitchen 3 weeks ago- her blood pressure drops and she passes out  OCCUPATION: retired  ACTIVITY LEVEL : walks, used to play tennis  PLOF: Independent  PATIENT GOALS: to urinate no more then once/ hour  PERTINENT HISTORY:  Parkinson's  Sexual abuse: No  BOWEL MOVEMENT:no issues   URINATION: Pain with urination: No Fully empty bladder: No Stream: Weak Urgency: Yes  Frequency: yes every hour Fluid Intake: water, tea Leakage: none Pads: No  INTERCOURSE:not active since about a year ago   PREGNANCY:  Vaginal deliveries 2 Tearing Yes:   Episiotomy No C-section deliveries no Currently pregnant No  PROLAPSE: None   OBJECTIVE:  Note: Objective measures were completed at Evaluation unless otherwise noted.   PATIENT SURVEYS:   PFIQ-7: 19  COGNITION: Overall cognitive status: Within functional limits for tasks assessed     SENSATION: Light touch: Appears intact   FUNCTIONAL TESTS:  Squat: genu valgum Single leg stance: decreased balance   Curl-up test:able to    GAIT: Assistive device utilized: None Comments:   POSTURE: rounded shoulders, forward head, decreased lumbar lordosis, and increased thoracic kyphosis   LUMBARAROM/PROM:  A/PROM A/PROM  Eval (% available)  Flexion 75  Extension 75  Right lateral flexion 75  Left lateral flexion 75  Right rotation 75  Left rotation 75   (Blank rows = not tested)  PALPATION:    General: very tender left lower rib  Pelvic Alignment: even  Abdominal: restrictions around bladder                External Perineal Exam: pelvic floor contraction palpated externally                             Internal Pelvic Floor: deferred  Patient confirms identification and approves PT to assess internal pelvic floor and treatment No  PELVIC MMT:   MMT eval  Vaginal   Internal Anal Sphincter   External Anal Sphincter   Puborectalis   Diastasis Recti no  (Blank rows = not tested)        TONE: deferred  PROLAPSE: deferred  TODAY'S TREATMENT:  DATE:  03/06/2024 Education on urge suppression  Education on bladder diary Review of progress Education on relevant anatomy Education on squatty potty Diaphragmatic breathing 10 breaths     02/20/24; Pt educated on urge drill, she didn't remember this from eval and was reviewed today with new handout given. Pt reviewed it as well, all questions answered. Initially to direct pt in decreased nighttime voids as she is waking every hour to urinate at night.  Pt educated on step stool and relaxing breathing to empty with urine output and double voiding Declined internal assessment at this, was educated on it but declined. Pt educated on fiber types, and abdominal massage for improved bowel regularity      PATIENT EDUCATION/ there acts Education details:Pt was educated on relevant anatomy, exam findings, home exercise program, plan of care, expectations of PT and internal muscle assessment  Person educated: Patient Education method: Explanation, Demonstration, Tactile cues, Verbal cues, and Handouts Education comprehension: verbalized understanding  HOME EXERCISE PROGRAM: Access Code: [email or web address] URL: [email or web address] Date: 01/10/2024 Prepared by: Cori Ziad Maye  Patient  Education - Bladder Retraining Strategies - Lifestyle Changes to Support Urinary and Bladder Health - Urinary Urge Control Techniques - Urinary Urge Control Techniques - Get To Know Your Pelvic Floor- Female  ASSESSMENT:  CLINICAL IMPRESSION: Pt presents for skilled PT treatment for urinary incontinence and urgency. Pt reports that she drinks about 20 oz/ water a day. Gets up about every 3 hours. She did not want internal pelvic floor assessment.Seems like she under hydrates. Has bilateral hip weakness, pelvic floor lift palpated externally  tends to lift and do a posterior pelvic tilt. Patient to fill out a bladder diary and increase hydration to reduce bladder irritation. Discussed drinking slowly and increase gradually.   Pt would benefit from additional PT to further address deficits.    OBJECTIVE IMPAIRMENTS: decreased activity tolerance, decreased coordination, decreased endurance, decreased mobility, decreased ROM, decreased strength, increased fascial restrictions, increased muscle spasms, impaired flexibility, impaired tone, improper body mechanics, postural dysfunction, and pain.   ACTIVITY LIMITATIONS: lifting, sitting, standing, stairs, transfers, locomotion level, and caring for others  PARTICIPATION LIMITATIONS: meal prep, cleaning, laundry, personal finances, interpersonal relationship, driving, shopping, community activity, and yard work  PERSONAL FACTORS: Age, Behavior pattern, and 1-2 comorbidities: Parkinson's, heart disease are also affecting patient's functional outcome.   REHAB POTENTIAL: Fair    CLINICAL DECISION MAKING: Evolving/moderate complexity  EVALUATION COMPLEXITY: Moderate   GOALS: Goals reviewed with patient? Yes  SHORT TERM GOALS: Target date: 02/07/2024    Pt will be independent with HEP.   Baseline: Goal status: INITIAL  2.  Patient will be educated on urge suppression techniques Baseline:  Goal status: met 03/06/2024  3.  Patient will be  educated on healthy bladder habits and fill out a bladder diary Baseline:  Goal status: met 03/06/24  4.  Patient will be educated on double voiding Baseline:  Goal status: INITIAL   LONG TERM GOALS: Target date: 04/03/2024    Pt will be independent with advanced HEP.   Baseline:  Goal status: INITIAL  2.  Patient will have at least 2-3 hours between voids Baseline:  Goal status: INITIAL  3.  Patient will teach back healthy bladder habits Baseline:  Goal status: INITIAL  4.  Patient will get up max 1 time at night to urinate Baseline:  Goal status: INITIAL  5.  Patient will be I with urge suppression strategies Baseline:  Goal status: INITIAL   PLAN:  PT FREQUENCY: 1-2x/week  PT DURATION: 3 months   PLANNED INTERVENTIONS: 97164- PT Re-evaluation, 97110-Therapeutic exercises, 97530- Therapeutic activity, 97112- Neuromuscular re-education, [phone]- Self Care, 02859- Manual therapy, [phone]- Gait training, [phone]- Aquatic Therapy, [phone]- Electrical stimulation (unattended), [phone]- Traction (mechanical), [id number]- Ionotophoresis 4mg /ml Dexamethasone, 20560 (1-2 muscles), 20561 (3+ muscles)- Dry Needling, Patient/Family education, Balance training, Taping, Joint mobilization, Joint manipulation, Spinal manipulation, Spinal mobilization, Scar mobilization, Vestibular training, Cryotherapy, Moist heat, and Biofeedback  PLAN FOR NEXT SESSION: internal pelvic floor assessment if Ok with patient, urge suppression techniques, bladder diary    Mishael Haran, PT 03/06/24 4:17 PM    [facility] [address], [address] [place], [place] [address] Phone # [phone] Fax [phone]

## 2024-03-11 ENCOUNTER — Other Ambulatory Visit: Payer: Self-pay | Admitting: Neurology

## 2024-03-11 DIAGNOSIS — G20B1 Parkinson's disease with dyskinesia, without mention of fluctuations: Secondary | ICD-10-CM

## 2024-03-24 ENCOUNTER — Other Ambulatory Visit: Payer: Self-pay | Admitting: Neurology

## 2024-03-24 DIAGNOSIS — G20B1 Parkinson's disease with dyskinesia, without mention of fluctuations: Secondary | ICD-10-CM

## 2024-04-02 ENCOUNTER — Ambulatory Visit: Attending: Obstetrics | Admitting: Physical Therapy

## 2024-04-02 ENCOUNTER — Encounter: Payer: Self-pay | Admitting: Physical Therapy

## 2024-04-02 DIAGNOSIS — M25511 Pain in right shoulder: Secondary | ICD-10-CM | POA: Insufficient documentation

## 2024-04-02 DIAGNOSIS — M6281 Muscle weakness (generalized): Secondary | ICD-10-CM | POA: Insufficient documentation

## 2024-04-02 DIAGNOSIS — M25611 Stiffness of right shoulder, not elsewhere classified: Secondary | ICD-10-CM | POA: Insufficient documentation

## 2024-04-02 DIAGNOSIS — R279 Unspecified lack of coordination: Secondary | ICD-10-CM | POA: Diagnosis not present

## 2024-04-02 DIAGNOSIS — R252 Cramp and spasm: Secondary | ICD-10-CM | POA: Insufficient documentation

## 2024-04-02 NOTE — Therapy (Signed)
 OUTPATIENT PHYSICAL THERAPY FEMALE PELVIC TREATMENT- recert   Patient Name: Lindsey Bryant MRN: [id number] DOB:Jul 14, 1940, 83 y.o., female Today's Date: 04/02/2024  END OF SESSION:  PT End of Session - 04/02/24 1235     Visit Number 4    Date for Recertification  07/04/24    Authorization Type aetna [id number]  no auth req    PT Start Time 1233    PT Stop Time 1311    PT Time Calculation (min) 38 min    Activity Tolerance Patient tolerated treatment well    Behavior During Therapy [facility] for tasks assessed/performed          Past Medical History:  Diagnosis Date   Acute systolic heart failure (HCC)    Arm fracture    Bradycardia    a. 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   Parkinson's disease    Past Surgical History:  Procedure Laterality Date   APPENDECTOMY  1952   COLONOSCOPY     EYE SURGERY  07/2017   Cataract Sx   LEFT HEART CATH AND CORONARY ANGIOGRAPHY N/A 08/03/2016   Procedure: Left Heart Cath and Coronary Angiography;  Surgeon: Alm LELON Clay, MD;  Location: [facility] INVASIVE CV LAB;  Service: Cardiovascular;  Laterality: N/A;   MELANOMA EXCISION Right 2000   right arm   PACEMAKER PLACEMENT  05/2012   PACEMAKER REVISION N/A 03/14/2012   Procedure: PACEMAKER REVISION;  Surgeon: Lynwood Rakers, MD;  Location: [facility] CATH LAB;  Service: Cardiovascular;  Laterality: N/A;   PERMANENT PACEMAKER INSERTION N/A 03/06/2012   Procedure: PERMANENT PACEMAKER INSERTION;  Surgeon: Elspeth JAYSON Sage, MD;  Location: [facility] CATH LAB;  Service: Cardiovascular;  Laterality: N/A;   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   Patient Active Problem List   Diagnosis Date Noted   Nocturia 10/14/2023   Feeling of incomplete bladder emptying 10/14/2023   Incontinence of feces 10/14/2023   Overactive bladder 10/14/2023   Macrocytic anemia  07/15/2021   Pathologic fracture of right humerus 05/31/2021   NICM (nonischemic cardiomyopathy) (HCC) 08/25/2020   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  PCP: Micheal Wolm LELON, MD  REFERRING PROVIDER: Guadlupe Lianne DASEN, MD  REFERRING DIAG: G20.A1 (ICD-10-CM) - Parkinson's disease, unspecified whether dyskinesia present, unspecified whether manifestations fluctuate (HCC) R35.1 (ICD-10-CM) - Nocturia R39.14 (ICD-10-CM) - Feeling of incomplete bladder emptying  THERAPY DIAG:  Muscle weakness (generalized)  Unspecified lack of coordination  Cramp and spasm  Stiffness of right shoulder, not elsewhere classified  Acute pain of right shoulder  Rationale for Evaluation and Treatment: Rehabilitation  ONSET DATE: 2024  SUBJECTIVE:  SUBJECTIVE STATEMENT: Patient returned after 4 weeks.  Feels 75% improved Wants to be done with PT.  Patient reports that she is not able to to keep up with bladder diary Learned that she needs to drink less liquid at night and needs to take longer time while peeing. Stated that she did not realize that she can sit there a little longer and empty more.  Feels pretty good Still gets up at night 5 times to urinate, takes naps during the  day, is tired at night.     Last visit Patient reports that she gets up every 2-3 hours at night. Patient reports that she needs to give herself longer to urinate.  She falls in in the bathroom due to low blood pressure, has a walker. But does not use it very much at home, bathroom is close. She urinates during the day every hour.  Patient does not want internal.  Does kegels when she is in bed and it delays the urge Bowels are not a big problem today, diet helps a lot Parkinsons makes her tired, bothers her the most Drinks about 20 oz/ day   Reports she has been getting up to urinating every 1 hour during the night and day now. Reports bowel movement are really a big problem. Does take miralax , but will have loose stools.      PAIN:  Are you having pain? No   PRECAUTIONS: pacemaker, parkinson's, fall risk, bradykinesia   RED FLAGS: None   WEIGHT BEARING RESTRICTIONS: No  FALLS:  Has patient fallen in last 6 months? Yes. Number of falls 1- hit her left ribs in the kitchen 3 weeks ago- her blood pressure drops and she passes out  OCCUPATION: retired  ACTIVITY LEVEL : walks, used to play tennis  PLOF: Independent  PATIENT GOALS: to urinate no more then once/ hour  PERTINENT HISTORY:  Parkinson's  Sexual abuse: No  BOWEL MOVEMENT:no issues   URINATION: Pain with urination: No Fully empty bladder: No Stream: Weak Urgency: Yes  Frequency: yes every hour Fluid Intake: water, tea Leakage: none Pads: No  INTERCOURSE:not active since about a year ago   PREGNANCY:  Vaginal deliveries 2 Tearing Yes:   Episiotomy No C-section deliveries no Currently pregnant No  PROLAPSE: None   OBJECTIVE:  Note: Objective measures were completed at Evaluation unless otherwise noted.   PATIENT SURVEYS:   PFIQ-7: 19  COGNITION: Overall cognitive status: Within functional limits for tasks assessed     SENSATION: Light touch: Appears intact   FUNCTIONAL  TESTS:  Squat: genu valgum Single leg stance: decreased balance   Curl-up test:able to    GAIT: Assistive device utilized: None Comments:   POSTURE: rounded shoulders, forward head, decreased lumbar lordosis, and increased thoracic kyphosis   LUMBARAROM/PROM:  A/PROM A/PROM  Eval (% available)  Flexion 75  Extension 75  Right lateral flexion 75  Left lateral flexion 75  Right rotation 75  Left rotation 75   (Blank rows = not tested)  PALPATION:   General: very tender left lower rib  Pelvic Alignment: even  Abdominal: restrictions around bladder                External Perineal Exam: pelvic floor contraction palpated externally                             Internal Pelvic Floor: deferred  Patient confirms identification and approves PT to assess internal pelvic  floor and treatment No  PELVIC MMT:   MMT eval  Vaginal   Internal Anal Sphincter   External Anal Sphincter   Puborectalis   Diastasis Recti no  (Blank rows = not tested)        TONE: deferred  PROLAPSE: deferred  TODAY'S TREATMENT:                                                                                                                              DATE:  04/02/2024 Review of progress and bladder diary Urge suppression techniques review  and education on  Pelvic floor quick flicks Ankle pumps 20 reps Diaphragmatic breathing 10 breaths with red theraband around lower ribs for feedback Education on bladder retraining, time between voids    03/06/2024 Education on urge suppression  Education on bladder diary Review of progress Education on relevant anatomy Education on squatty potty Diaphragmatic breathing 10 breaths     02/20/24; Pt educated on urge drill, she didn't remember this from eval and was reviewed today with new handout given. Pt reviewed it as well, all questions answered. Initially to direct pt in decreased nighttime voids as she is waking every hour to urinate at  night.  Pt educated on step stool and relaxing breathing to empty with urine output and double voiding Declined internal assessment at this, was educated on it but declined. Pt educated on fiber types, and abdominal massage for improved bowel regularity      PATIENT EDUCATION/ there acts Education details:Pt was educated on relevant anatomy, exam findings, home exercise program, plan of care, expectations of PT and internal muscle assessment  Person educated: Patient Education method: Explanation, Demonstration, Tactile cues, Verbal cues, and Handouts Education comprehension: verbalized understanding  HOME EXERCISE PROGRAM: Access Code: [email or web address] URL: [email or web address] Date: 01/10/2024 Prepared by: Cori Oveda Dadamo  Patient Education - Bladder Retraining Strategies - Lifestyle Changes to Support Urinary and Bladder Health - Urinary Urge Control Techniques - Urinary Urge Control Techniques - Get To Know Your Pelvic Floor- Female  ASSESSMENT:  CLINICAL IMPRESSION: Patient was seen today for treatment of urinary urgency and nocturia. Patient with stated 75% progress, still has to get up 5 times at night but spends more time in the bathroom and is able to empty bladder better during the day. Ginger ale gives her urgency. Patient did well with exercises, manual therapy and education today. We discussed progress, urge suppression and recommended consistency with reducing bladder irritants.. Patient is progressing slowly towards goals and will benefit from continued PT to address deficits, reduce urinary urgency and improve quality of life. Patient does not want internal pelvic floor assessment and parkinson's dyskinesia is limiting her progress. She wakes up at night 5 times to urinate and is not able to complete her bladder diary. Just in case pees.        Last visit Pt presents for skilled PT treatment for urinary incontinence and urgency. Pt reports that she drinks about  20 oz/ water a day. Gets up about every 3 hours. She did not want internal pelvic floor assessment.Seems like she under hydrates. Has bilateral hip weakness, pelvic floor lift palpated externally  tends to lift and do a posterior pelvic tilt. Patient to fill out a bladder diary and increase hydration to reduce bladder irritation. Discussed drinking slowly and increase gradually.   Pt would benefit from additional PT to further address deficits.    OBJECTIVE IMPAIRMENTS: decreased activity tolerance, decreased coordination, decreased endurance, decreased mobility, decreased ROM, decreased strength, increased fascial restrictions, increased muscle spasms, impaired flexibility, impaired tone, improper body mechanics, postural dysfunction, and pain.   ACTIVITY LIMITATIONS: lifting, sitting, standing, stairs, transfers, locomotion level, and caring for others  PARTICIPATION LIMITATIONS: meal prep, cleaning, laundry, personal finances, interpersonal relationship, driving, shopping, community activity, and yard work  PERSONAL FACTORS: Age, Behavior pattern, and 1-2 comorbidities: Parkinson's, heart disease are also affecting patient's functional outcome.   REHAB POTENTIAL: Fair    CLINICAL DECISION MAKING: Evolving/moderate complexity  EVALUATION COMPLEXITY: Moderate   GOALS: Goals reviewed with patient? Yes  SHORT TERM GOALS: Target date: 02/07/2024    Pt will be independent with HEP.   Baseline: Goal status: ongoing 04/02/24  2.  Patient will be educated on urge suppression techniques Baseline:  Goal status: met 03/06/2024  3.  Patient will be educated on healthy bladder habits and fill out a bladder diary Baseline:  Goal status: met 03/06/24  4.  Patient will be educated on double voiding Baseline:  Goal status: met 04/02/24   LONG TERM GOALS: Target date: 04/03/2024 updated 06/25/2024    Pt will be independent with advanced HEP.   Baseline:  Goal status: INITIAL  2.   Patient will have at least 2-3 hours between voids Baseline:  Goal status: INITIAL  3.  Patient will teach back healthy bladder habits Baseline:  Goal status: ongoing 04/02/24  4.  Patient will get up max 1 time at night to urinate Baseline: 5 Goal status: ongoing 04/02/2024  5.  Patient will be I with urge suppression strategies Baseline:  Goal status: ongoing 04/02/2024   PLAN:  PT FREQUENCY: 1-2x/week  PT DURATION: 3 months   PLANNED INTERVENTIONS: 97164- PT Re-evaluation, 97110-Therapeutic exercises, 97530- Therapeutic activity, 97112- Neuromuscular re-education, 97535- Self Care, [id number]- Manual therapy, [phone]- Gait training, [phone]- Aquatic Therapy, [phone]- Electrical stimulation (unattended), [phone]- Traction (mechanical), [id number]- Ionotophoresis 4mg /ml Dexamethasone, 79439 (1-2 muscles), 20561 (3+ muscles)- Dry Needling, Patient/Family education, Balance training, Taping, Joint mobilization, Joint manipulation, Spinal manipulation, Spinal mobilization, Scar mobilization, Vestibular training, Cryotherapy, Moist heat, and Biofeedback  PLAN FOR NEXT SESSION: internal pelvic floor assessment if Ok with patient, urge suppression techniques, bladder diary    Latima Hamza, PT 04/02/24 1:11 PM    [facility] [address], [address] [place], [place] [address] Phone # [phone] Fax [phone]

## 2024-04-08 ENCOUNTER — Other Ambulatory Visit: Payer: Self-pay | Admitting: Neurology

## 2024-04-08 DIAGNOSIS — G903 Multi-system degeneration of the autonomic nervous system: Secondary | ICD-10-CM

## 2024-04-09 ENCOUNTER — Ambulatory Visit: Admitting: Physical Therapy

## 2024-04-09 DIAGNOSIS — R279 Unspecified lack of coordination: Secondary | ICD-10-CM

## 2024-04-09 DIAGNOSIS — M25511 Pain in right shoulder: Secondary | ICD-10-CM | POA: Diagnosis not present

## 2024-04-09 DIAGNOSIS — M25611 Stiffness of right shoulder, not elsewhere classified: Secondary | ICD-10-CM | POA: Diagnosis not present

## 2024-04-09 DIAGNOSIS — R252 Cramp and spasm: Secondary | ICD-10-CM | POA: Diagnosis not present

## 2024-04-09 DIAGNOSIS — M6281 Muscle weakness (generalized): Secondary | ICD-10-CM | POA: Diagnosis not present

## 2024-04-09 NOTE — Patient Instructions (Signed)

## 2024-04-09 NOTE — Therapy (Signed)
 OUTPATIENT PHYSICAL THERAPY FEMALE PELVIC TREATMENT- recert   Patient Name: Lindsey Bryant MRN: [id number] DOB:1940/11/01, 83 y.o., female Today's Date: 04/09/2024  END OF SESSION:  PT End of Session - 04/09/24 1559     Visit Number 5    Date for Recertification  07/04/24    Authorization Type aetna [id number]  no auth req    PT Start Time 1450    PT Stop Time 1530    PT Time Calculation (min) 40 min    Activity Tolerance Patient tolerated treatment well    Behavior During Therapy [facility] for tasks assessed/performed           Past Medical History:  Diagnosis Date   Acute systolic heart failure (HCC)    Arm fracture    Bradycardia    a. 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   Parkinson's disease    Past Surgical History:  Procedure Laterality Date   APPENDECTOMY  1952   COLONOSCOPY     EYE SURGERY  07/2017   Cataract Sx   LEFT HEART CATH AND CORONARY ANGIOGRAPHY N/A 08/03/2016   Procedure: Left Heart Cath and Coronary Angiography;  Surgeon: Alm LELON Clay, MD;  Location: [facility] INVASIVE CV LAB;  Service: Cardiovascular;  Laterality: N/A;   MELANOMA EXCISION Right 2000   right arm   PACEMAKER PLACEMENT  05/2012   PACEMAKER REVISION N/A 03/14/2012   Procedure: PACEMAKER REVISION;  Surgeon: Lynwood Rakers, MD;  Location: [facility] CATH LAB;  Service: Cardiovascular;  Laterality: N/A;   PERMANENT PACEMAKER INSERTION N/A 03/06/2012   Procedure: PERMANENT PACEMAKER INSERTION;  Surgeon: Elspeth JAYSON Sage, MD;  Location: 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  PCP: Micheal Wolm LELON, MD  REFERRING PROVIDER: Guadlupe Lianne DASEN, MD  REFERRING DIAG: G20.A1 (ICD-10-CM) - Parkinson's disease, unspecified whether dyskinesia present, unspecified whether manifestations fluctuate (HCC) R35.1 (ICD-10-CM) - Nocturia R39.14 (ICD-10-CM) - Feeling of incomplete bladder emptying  THERAPY DIAG:  Muscle weakness (generalized)  Unspecified lack of coordination  Rationale for Evaluation and Treatment: Rehabilitation  ONSET DATE: 2024  SUBJECTIVE:  SUBJECTIVE STATEMENT: Feels 75% improved Still gets up 3x nightly (down from 4-5x) Now urinating about ~2-3 hours during the day, fully emptying.     Last visit Patient reports that she gets up every 2-3 hours at night. Patient reports that she needs to give herself longer to urinate.  She falls in in the bathroom due to low blood pressure, has a walker. But does not use it very much at home, bathroom is close. She urinates during the day every hour.  Patient does not want internal.  Does kegels when she is in bed and it delays the  urge Bowels are not a big problem today, diet helps a lot Parkinsons makes her tired, bothers her the most Drinks about 20 oz/ day   Reports she has been getting up to urinating every 1 hour during the night and day now. Reports bowel movement are really a big problem. Does take miralax , but will have loose stools.      PAIN:  Are you having pain? No   PRECAUTIONS: pacemaker, parkinson's, fall risk, bradykinesia   RED FLAGS: None   WEIGHT BEARING RESTRICTIONS: No  FALLS:  Has patient fallen in last 6 months? Yes. Number of falls 1- hit her left ribs in the kitchen 3 weeks ago- her blood pressure drops and she passes out  OCCUPATION: retired  ACTIVITY LEVEL : walks, used to play tennis  PLOF: Independent  PATIENT GOALS: to urinate no more then once/ hour  PERTINENT HISTORY:  Parkinson's  Sexual abuse: No  BOWEL MOVEMENT:no issues   URINATION: Pain with urination: No Fully empty bladder: No Stream: Weak Urgency: Yes  Frequency: yes every hour Fluid Intake: water, tea Leakage: none Pads: No  INTERCOURSE:not active since about a year ago   PREGNANCY:  Vaginal deliveries 2 Tearing Yes:   Episiotomy No C-section deliveries no Currently pregnant No  PROLAPSE: None   OBJECTIVE:  Note: Objective measures were completed at Evaluation unless otherwise noted.   PATIENT SURVEYS:   PFIQ-7: 19  COGNITION: Overall cognitive status: Within functional limits for tasks assessed     SENSATION: Light touch: Appears intact   FUNCTIONAL TESTS:  Squat: genu valgum Single leg stance: decreased balance   Curl-up test:able to    GAIT: Assistive device utilized: None Comments:   POSTURE: rounded shoulders, forward head, decreased lumbar lordosis, and increased thoracic kyphosis   LUMBARAROM/PROM:  A/PROM A/PROM  Eval (% available)  Flexion 75  Extension 75  Right lateral flexion 75  Left lateral flexion 75  Right rotation 75  Left rotation 75    (Blank rows = not tested)  PALPATION:   General: very tender left lower rib  Pelvic Alignment: even  Abdominal: restrictions around bladder                External Perineal Exam: pelvic floor contraction palpated externally                             Internal Pelvic Floor: deferred  Patient confirms identification and approves PT to assess internal pelvic floor and treatment No  PELVIC MMT:   MMT eval  Vaginal   Internal Anal Sphincter   External Anal Sphincter   Puborectalis   Diastasis Recti no  (Blank rows = not tested)        TONE: deferred  PROLAPSE: deferred  TODAY'S TREATMENT:  DATE:  04/09/24: Reviewed urge drill for night time and to attempt to decrease just in case pees.  Educated on breathing mechanics with bed mobility and pt completed log rolling  x2 with good techniques  Reviewed all goals and HEP - pt denied questions and agreeable to DC today  04/02/2024 Review of progress and bladder diary Urge suppression techniques review  and education on  Pelvic floor quick flicks Ankle pumps 20 reps Diaphragmatic breathing 10 breaths with red theraband around lower ribs for feedback Education on bladder retraining, time between voids    03/06/2024 Education on urge suppression  Education on bladder diary Review of progress Education on relevant anatomy Education on squatty potty Diaphragmatic breathing 10 breaths     02/20/24; Pt educated on urge drill, she didn't remember this from eval and was reviewed today with new handout given. Pt reviewed it as well, all questions answered. Initially to direct pt in decreased nighttime voids as she is waking every hour to urinate at night.  Pt educated on step stool and relaxing breathing to empty with urine output and double voiding Declined internal assessment at this, was educated  on it but declined. Pt educated on fiber types, and abdominal massage for improved bowel regularity      PATIENT EDUCATION/ there acts Education details:Pt was educated on relevant anatomy, exam findings, home exercise program, plan of care, expectations of PT and internal muscle assessment  Person educated: Patient Education method: Explanation, Demonstration, Tactile cues, Verbal cues, and Handouts Education comprehension: verbalized understanding  HOME EXERCISE PROGRAM: Access Code: [email or web address] URL: [email or web address] Date: 01/10/2024 Prepared by: Cori Helmus  Patient Education - Bladder Retraining Strategies - Lifestyle Changes to Support Urinary and Bladder Health - Urinary Urge Control Techniques - Urinary Urge Control Techniques - Get To Know Your Pelvic Floor- Female  ASSESSMENT:  CLINICAL IMPRESSION: Patient was seen today for treatment of urinary urgency and nocturia pt reports decreased nighttime voids to 2-3x and pleased with this, bowels are doing better as well. Does still have irregular bowel movements but more consistent then before. Has met all goals except night time frequency but she is ok with remaining symptoms and pt did request DC today stating she feels about 75% better maybe 80%. And understands with PD she has chronic needs and understands if she needs future PT she would need new referral.        Last visit Pt presents for skilled PT treatment for urinary incontinence and urgency. Pt reports that she drinks about 20 oz/ water a day. Gets up about every 3 hours. She did not want internal pelvic floor assessment.Seems like she under hydrates. Has bilateral hip weakness, pelvic floor lift palpated externally  tends to lift and do a posterior pelvic tilt. Patient to fill out a bladder diary and increase hydration to reduce bladder irritation. Discussed drinking slowly and increase gradually.   Pt would benefit from additional PT to further  address deficits.    OBJECTIVE IMPAIRMENTS: decreased activity tolerance, decreased coordination, decreased endurance, decreased mobility, decreased ROM, decreased strength, increased fascial restrictions, increased muscle spasms, impaired flexibility, impaired tone, improper body mechanics, postural dysfunction, and pain.   ACTIVITY LIMITATIONS: lifting, sitting, standing, stairs, transfers, locomotion level, and caring for others  PARTICIPATION LIMITATIONS: meal prep, cleaning, laundry, personal finances, interpersonal relationship, driving, shopping, community activity, and yard work  PERSONAL FACTORS: Age, Behavior pattern, and 1-2 comorbidities: Parkinson's, heart disease are also affecting patient's functional outcome.   REHAB POTENTIAL: Fair  CLINICAL DECISION MAKING: Evolving/moderate complexity  EVALUATION COMPLEXITY: Moderate   GOALS: Goals reviewed with patient? Yes  SHORT TERM GOALS: Target date: 02/07/2024    Pt will be independent with HEP.   Baseline: Goal status: MET 04/09/24  2.  Patient will be educated on urge suppression techniques Baseline:  Goal status: MET 03/06/2024  3.  Patient will be educated on healthy bladder habits and fill out a bladder diary Baseline:  Goal status: met 03/06/24  4.  Patient will be educated on double voiding Baseline:  Goal status: met 04/02/24   LONG TERM GOALS: Target date: 04/03/2024 updated 06/25/2024    Pt will be independent with advanced HEP.   Baseline:  Goal status: MET 04/09/24  2.  Patient will have at least 2-3 hours between voids Baseline:  Goal status:  MET 04/09/24  3.  Patient will teach back healthy bladder habits Baseline:  Goal status:  MET 04/09/24  4.  Patient will get up max 1 time at night to urinate Baseline: 5 Goal status:  not met - 2x-3x nightly 04/09/24  5.  Patient will be I with urge suppression strategies Baseline:  Goal status:  MET 04/09/24 - I but educated to do at night  too   PLAN:  PT FREQUENCY: 1-2x/week  PT DURATION: 3 months   PLANNED INTERVENTIONS: 97164- PT Re-evaluation, 97110-Therapeutic exercises, 97530- Therapeutic activity, 97112- Neuromuscular re-education, [phone]- Self Care, 02859- Manual therapy, [phone]- Gait training, [phone]- Aquatic Therapy, [phone]- Electrical stimulation (unattended), [phone]- Traction (mechanical), [id number]- Ionotophoresis 4mg /ml Dexamethasone, 20560 (1-2 muscles), 20561 (3+ muscles)- Dry Needling, Patient/Family education, Balance training, Taping, Joint mobilization, Joint manipulation, Spinal manipulation, Spinal mobilization, Scar mobilization, Vestibular training, Cryotherapy, Moist heat, and Biofeedback  PLAN FOR NEXT SESSION:   PHYSICAL THERAPY DISCHARGE SUMMARY  Visits from Start of Care: 5  Current functional level related to goals / functional outcomes: All STG met, 4/5 LTG met   Remaining deficits: Increased nighttime urination, irregular bowels    Education / Equipment: HEP   Patient agrees to discharge. Patient goals were partially met. Patient is being discharged due to being pleased with the current functional level.    Darryle Navy, PT, DPT 11/17/254:00 PM  [facility] [address] [place], [place] [address] Phone # [phone] Fax [phone]

## 2024-04-12 ENCOUNTER — Encounter: Payer: Self-pay | Admitting: Neurology

## 2024-04-25 ENCOUNTER — Encounter: Payer: Self-pay | Admitting: Family Medicine

## 2024-04-25 ENCOUNTER — Ambulatory Visit: Admitting: Family Medicine

## 2024-04-25 ENCOUNTER — Encounter: Payer: Medicare HMO | Admitting: Family Medicine

## 2024-04-25 ENCOUNTER — Ambulatory Visit: Payer: Medicare HMO

## 2024-04-25 VITALS — BP 138/70 | HR 68 | Temp 97.8°F | Ht 61.42 in | Wt 104.9 lb

## 2024-04-25 DIAGNOSIS — I428 Other cardiomyopathies: Secondary | ICD-10-CM

## 2024-04-25 DIAGNOSIS — Z0000 Encounter for general adult medical examination without abnormal findings: Secondary | ICD-10-CM | POA: Diagnosis not present

## 2024-04-25 DIAGNOSIS — R5383 Other fatigue: Secondary | ICD-10-CM

## 2024-04-25 NOTE — Progress Notes (Signed)
 Established Patient Office Visit  Subjective   Patient ID: Lindsey Bryant, female    DOB: 12/27/1940  Age: 83 y.o. MRN: [id number]  Chief Complaint  Patient presents with   Annual Exam    HPI   Lindsey Bryant is seen for annual physical exam.  She has chronic problems including history of complete heart block, neurogenic orthostatic hypotension related to her Parkinson's disease, history of non-ST elevated MI, IBS, history of melanoma.  Seen today companied by husband.  Her main issue has been just pervasive fatigue.   She is very limited in exercise because of her dizziness and fatigue and weakness.  Followed by neurology and maintained on Sinemet .  She also takes midodrine  5 mg tablet 2 in the morning 2 around noon and 1 at night for her orthostatic hypotension.  Denies any recent falls.  Husband does relate that her memory seems to be declining somewhat.  She tries to do some chair exercises.  Health maintenance reviewed:  Health Maintenance  Topic Date Due   DTaP/Tdap/Td (2 - Td or Tdap) 06/03/2021   Medicare Annual Wellness (AWV)  11/15/2023   COVID-19 Vaccine (5 - Moderna risk 2025-26 season) 09/23/2024   Influenza Vaccine  Completed   Bone Density Scan  Completed   Zoster Vaccines- Shingrix  Completed   Meningococcal B Vaccine  Aged Out   Pneumococcal Vaccine: 50+ Years  Discontinued   Mammogram  Discontinued   - She has decided not to pursue further mammograms. -No history of RSV vaccine and she will consider.  Social history-married with 2 daughters.  No alcohol.  Quit smoking around age 27 after about 3 or 4-pack-year history.  Family History  Problem Relation Age of Onset   Heart attack Mother 54       Details unclear   Alcohol abuse Father    Breast cancer Sister 54   Diabetes Brother    Hypothyroidism Daughter    Hypothyroidism Daughter    Breast cancer Other    Colon cancer Neg Hx    Esophageal cancer Neg Hx    Stomach cancer Neg Hx    Pancreatic cancer Neg Hx     Liver disease Neg Hx    Bladder Cancer Neg Hx    Uterine cancer Neg Hx    Rectal cancer Neg Hx     Review of Systems  Constitutional:  Positive for malaise/fatigue. Negative for chills and fever.  Eyes:  Negative for blurred vision.  Respiratory:  Negative for shortness of breath.   Cardiovascular:  Negative for chest pain.  Gastrointestinal:  Negative for abdominal pain.  Neurological:  Negative for focal weakness and headaches.      Objective:     BP 138/70 (BP Location: Left Arm, Cuff Size: Normal)   Pulse 68   Temp 97.8 F (36.6 C) (Oral)   Ht 5' 1.42 (1.56 m)   Wt 104 lb 14.4 oz (47.6 kg)   SpO2 97%   BMI 19.55 kg/m  BP Readings from Last 3 Encounters:  04/25/24 138/70  12/20/23 110/68  12/16/23 136/88   Wt Readings from Last 3 Encounters:  04/25/24 104 lb 14.4 oz (47.6 kg)  12/20/23 104 lb 9.6 oz (47.4 kg)  10/14/23 105 lb (47.6 kg)      Physical Exam Vitals reviewed.  Constitutional:      Appearance: She is well-developed.  Eyes:     Pupils: Pupils are equal, round, and reactive to light.  Neck:     Thyroid : No  thyromegaly.     Vascular: No JVD.  Cardiovascular:     Rate and Rhythm: Normal rate and regular rhythm.     Heart sounds:     No gallop.  Pulmonary:     Effort: Pulmonary effort is normal. No respiratory distress.     Breath sounds: Normal breath sounds. No wheezing or rales.  Musculoskeletal:     Cervical back: Neck supple.     Right lower leg: No edema.     Left lower leg: No edema.  Neurological:     Mental Status: She is alert.      No results found for any visits on 04/25/24.  Last CBC Lab Results  Component Value Date   WBC 5.7 04/25/2023   HGB 12.6 04/25/2023   HCT 37.8 04/25/2023   MCV 100.7 (H) 04/25/2023   MCH 34.0 (H) 02/19/2022   RDW 12.8 04/25/2023   PLT 189.0 04/25/2023   Last metabolic panel Lab Results  Component Value Date   GLUCOSE 88 04/25/2023   NA 137 04/25/2023   K 4.2 04/25/2023   CL 100  04/25/2023   CO2 31 04/25/2023   BUN 15 04/25/2023   CREATININE 0.70 04/25/2023   GFR 80.94 04/25/2023   CALCIUM  9.6 04/25/2023   PROT 7.0 04/25/2023   ALBUMIN 4.5 04/25/2023   LABGLOB 2.7 05/29/2021   BILITOT 0.6 04/25/2023   ALKPHOS 65 04/25/2023   AST 10 04/25/2023   ALT 2 04/25/2023   ANIONGAP 6 05/29/2021   Last lipids Lab Results  Component Value Date   CHOL 202 (H) 04/25/2023   HDL 68.10 04/25/2023   LDLCALC 113 (H) 04/25/2023   LDLDIRECT 111.7 06/05/2012   TRIG 107.0 04/25/2023   CHOLHDL 3 04/25/2023   Last vitamin D Lab Results  Component Value Date   VD25OH 80 03/24/2009      The ASCVD Risk score (Arnett DK, et al., 2019) failed to calculate for the following reasons:   The 2019 ASCVD risk score is only valid for ages 60 to 75   Risk score cannot be calculated because patient has a medical history suggesting prior/existing ASCVD    Assessment & Plan:   Problem List Items Addressed This Visit   None Visit Diagnoses       Physical exam    -  Primary   Relevant Orders   Basic metabolic panel with GFR   Lipid panel   CBC with Differential/Platelet   Hepatic function panel   TSH     Fatigue, unspecified type       Relevant Orders   CBC with Differential/Platelet   TSH     Chronic medical problems as above including Parkinson's disease with history of neurogenic hypotension. - Flu vaccine already given - Discussed RSV vaccine and she will consider.  She is definitely in high risk category with her age and underlying co-morbidities - Patient requesting labs.  Nonfasting today.  Return Friday for labs above - Continue exercise as tolerated.  She is very limited and upright exercises with her orthostatic issues - She has decided against other screenings such as mammography or DEXA  No follow-ups on file.    Wolm Scarlet, MD

## 2024-04-25 NOTE — Patient Instructions (Signed)
 Consider RSV vaccine

## 2024-04-26 LAB — CUP PACEART REMOTE DEVICE CHECK
Battery Impedance: 3266 Ohm
Battery Remaining Longevity: 19 mo
Battery Voltage: 2.71 V
Brady Statistic AP VP Percent: 20 %
Brady Statistic AP VS Percent: 0 %
Brady Statistic AS VP Percent: 80 %
Brady Statistic AS VS Percent: 0 %
Date Time Interrogation Session: 20251203172235
Implantable Lead Connection Status: 753985
Implantable Lead Connection Status: 753985
Implantable Lead Implant Date: 20131014
Implantable Lead Implant Date: 20131014
Implantable Lead Location: 753859
Implantable Lead Location: 753860
Implantable Lead Model: 5076
Implantable Lead Model: 5076
Implantable Pulse Generator Implant Date: 20131014
Lead Channel Impedance Value: 407 Ohm
Lead Channel Impedance Value: 668 Ohm
Lead Channel Pacing Threshold Amplitude: 0.375 V
Lead Channel Pacing Threshold Amplitude: 0.625 V
Lead Channel Pacing Threshold Pulse Width: 0.4 ms
Lead Channel Pacing Threshold Pulse Width: 0.4 ms
Lead Channel Setting Pacing Amplitude: 2 V
Lead Channel Setting Pacing Amplitude: 2.5 V
Lead Channel Setting Pacing Pulse Width: 0.4 ms
Lead Channel Setting Sensing Sensitivity: 4 mV
Zone Setting Status: 755011
Zone Setting Status: 755011

## 2024-04-27 ENCOUNTER — Ambulatory Visit: Payer: Self-pay | Admitting: Cardiology

## 2024-04-27 ENCOUNTER — Ambulatory Visit: Payer: Self-pay | Admitting: Family Medicine

## 2024-04-27 ENCOUNTER — Other Ambulatory Visit

## 2024-04-27 DIAGNOSIS — R5383 Other fatigue: Secondary | ICD-10-CM

## 2024-04-27 DIAGNOSIS — Z0000 Encounter for general adult medical examination without abnormal findings: Secondary | ICD-10-CM | POA: Diagnosis not present

## 2024-04-27 LAB — CBC WITH DIFFERENTIAL/PLATELET
Basophils Absolute: 0 K/uL (ref 0.0–0.1)
Basophils Relative: 0.8 % (ref 0.0–3.0)
Eosinophils Absolute: 0.1 K/uL (ref 0.0–0.7)
Eosinophils Relative: 2.1 % (ref 0.0–5.0)
HCT: 37.7 % (ref 36.0–46.0)
Hemoglobin: 12.8 g/dL (ref 12.0–15.0)
Lymphocytes Relative: 28.6 % (ref 12.0–46.0)
Lymphs Abs: 1.6 K/uL (ref 0.7–4.0)
MCHC: 34 g/dL (ref 30.0–36.0)
MCV: 99.9 fl (ref 78.0–100.0)
Monocytes Absolute: 0.4 K/uL (ref 0.1–1.0)
Monocytes Relative: 7.1 % (ref 3.0–12.0)
Neutro Abs: 3.4 K/uL (ref 1.4–7.7)
Neutrophils Relative %: 61.4 % (ref 43.0–77.0)
Platelets: 163 K/uL (ref 150.0–400.0)
RBC: 3.78 Mil/uL — ABNORMAL LOW (ref 3.87–5.11)
RDW: 12.6 % (ref 11.5–15.5)
WBC: 5.5 K/uL (ref 4.0–10.5)

## 2024-04-27 LAB — LIPID PANEL
Cholesterol: 210 mg/dL — ABNORMAL HIGH (ref 0–200)
HDL: 63.5 mg/dL (ref >39.00)
LDL Cholesterol: 131 mg/dL — ABNORMAL HIGH (ref 0–99)
NonHDL: 146.1
Total CHOL/HDL Ratio: 3
Triglycerides: 75 mg/dL (ref 0.0–149.0)
VLDL: 15 mg/dL (ref 0.0–40.0)

## 2024-04-27 LAB — BASIC METABOLIC PANEL WITH GFR
BUN: 20 mg/dL (ref 6–23)
CO2: 30 meq/L (ref 19–32)
Calcium: 10 mg/dL (ref 8.4–10.5)
Chloride: 99 meq/L (ref 96–112)
Creatinine, Ser: 0.83 mg/dL (ref 0.40–1.20)
GFR: 65.51 mL/min (ref >60.00)
Glucose, Bld: 96 mg/dL (ref 70–99)
Potassium: 3.7 meq/L (ref 3.5–5.1)
Sodium: 139 meq/L (ref 135–145)

## 2024-04-27 LAB — HEPATIC FUNCTION PANEL
ALT: 1 U/L (ref 0–35)
AST: 12 U/L (ref 0–37)
Albumin: 4.8 g/dL (ref 3.5–5.2)
Alkaline Phosphatase: 54 U/L (ref 39–117)
Bilirubin, Direct: 0.1 mg/dL (ref 0.0–0.3)
Total Bilirubin: 0.9 mg/dL (ref 0.2–1.2)
Total Protein: 7 g/dL (ref 6.0–8.3)

## 2024-04-27 LAB — TSH: TSH: 2.7 u[IU]/mL (ref 0.35–5.50)

## 2024-05-01 NOTE — Progress Notes (Signed)
 Remote PPM Transmission

## 2024-05-03 ENCOUNTER — Ambulatory Visit: Attending: Cardiovascular Disease | Admitting: Cardiovascular Disease

## 2024-05-03 ENCOUNTER — Encounter: Payer: Self-pay | Admitting: Cardiovascular Disease

## 2024-05-03 VITALS — BP 160/80 | HR 60 | Ht 62.0 in | Wt 102.0 lb

## 2024-05-03 DIAGNOSIS — G903 Multi-system degeneration of the autonomic nervous system: Secondary | ICD-10-CM | POA: Diagnosis not present

## 2024-05-03 DIAGNOSIS — I2510 Atherosclerotic heart disease of native coronary artery without angina pectoris: Secondary | ICD-10-CM | POA: Diagnosis not present

## 2024-05-03 DIAGNOSIS — I442 Atrioventricular block, complete: Secondary | ICD-10-CM | POA: Diagnosis not present

## 2024-05-03 DIAGNOSIS — I428 Other cardiomyopathies: Secondary | ICD-10-CM | POA: Diagnosis not present

## 2024-05-03 DIAGNOSIS — Z950 Presence of cardiac pacemaker: Secondary | ICD-10-CM | POA: Diagnosis not present

## 2024-05-03 DIAGNOSIS — I519 Heart disease, unspecified: Secondary | ICD-10-CM | POA: Diagnosis not present

## 2024-05-03 DIAGNOSIS — E7800 Pure hypercholesterolemia, unspecified: Secondary | ICD-10-CM | POA: Diagnosis not present

## 2024-05-03 NOTE — Patient Instructions (Signed)

## 2024-05-03 NOTE — Progress Notes (Signed)
 Cardiology Office Note   Date:  05/05/2024  ID:  Lindsey Bryant, Neace 08/25/1940, MRN [id number] PCP: Micheal Wolm ORN, MD  [facility] Providers Cardiologist:  Jerel Balding, MD Electrophysiologist:  Elspeth Sage, MD (Inactive)     History of Present Illness Lindsey Bryant is a 83 y.o. female with a history of complete heart block and a dual-chamber permanent pacemaker as well as Parkinson's disease complicated by orthostatic hypotension.  After Dr. Celine retirement, Dr. Inocencio has been reviewing her remote pacemaker downloads, but she has not yet met him in the clinic.  She is here to transition general cardiology care.  She is doing reasonably well but has relatively frequent episodes of orthostatic hypotension and only limited ability to perform physical activity due to her Parkinson's disease.  She is currently taking 5 mg tablets of midodrine , 2 first in the morning when she wakes up at 5 AM, another 2 around 8 AM and a single tablet of 5 mg around 11 AM.  She does not need midodrine  later in the day.  She has had several falls in the past, likely none with serious injury and none recently.  She has had a fall about every other month earlier in the year.  She denies chest pain or shortness of breath and has not had lower extremity edema, orthopnea, PND or other focal neurological complaints.  Mild dyskinetic movements of the head and shoulders are evident during the office visit today.  She is accompanied by her husband, who is clearly supportive.  He encourages her to try to do more physical activity to maintain the strength in her lower extremities.  Compression stockings have not been helpful since she is very very slender and even the smallest size will not provide any significant tissue compression (her daughter works for [employer]).  Pacemaker interrogation in the office today shows normal device function of her Medtronic Adapta L device implanted in 2013 (atrial lead 5076,  ventricularly 5092).  Anticipated generator ERI will occur in 18 months.  She has 20% atrial pacing and 99.8% ventricular pacing.  There have been no episodes of atrial fibrillation or high ventricular rate.  Lead parameters are normal.  Previous office visits have documented a narrow complex escape rhythm, but we did not test this today.  Echocardiography in the past has shown occasional reports of diminished LV function, by most recent echo EF was 50-55%.  She has not had manifestations of congestive heart failure.  One of the echoes in 2018 estimated the EF to be 35-40% and described akinesis of the mid apical anterior and inferior myocardium and hypokinesis of the mid to apical anteroseptal/inferoseptal/anterolateral/inferolateral myocardium.  This led to cardiac catheterization 2018 which showed minor nonobstructive coronary atherosclerosis (50% proximal RCA, 35% ostial and 40% distal LAD, 50% proximal-mid circumflex).  Suspect the reported wall motion abnormalities may have been pacing related.  An echocardiogram performed the following year showed EF 50-55% with pacing related to dyssynchrony.   Studies Reviewed EKG Interpretation Date/Time:  Thursday May 03 2024 10:31:50 EST Ventricular Rate:  60 PR Interval:  146 QRS Duration:  164 QT Interval:  482 QTC Calculation: 482 R Axis:   -32  Text Interpretation: AV dual-paced rhythm When compared with ECG of 22-Mar-2023 15:15, Vent. rate has decreased BY  13 BPM Confirmed by Deion Forgue ([id number]) on 05/03/2024 10:47:59 AM     Risk Assessment/Calculations        Physical Exam VS:  BP (!) 160/80 (BP Location: Left  Arm, Patient Position: Sitting, Cuff Size: Small)   Pulse 60   Ht 5' 2 (1.575 m)   Wt 102 lb (46.3 kg)   SpO2 100%   BMI 18.66 kg/m        Wt Readings from Last 3 Encounters:  05/03/24 102 lb (46.3 kg)  04/25/24 104 lb 14.4 oz (47.6 kg)  12/20/23 104 lb 9.6 oz (47.4 kg)    GEN: Well nourished, well developed in  no acute distress.  She is very petite NECK: No JVD; No carotid bruits CARDIAC: Normal S1, paradoxically split S2, RRR, no murmurs, rubs, gallops RESPIRATORY:  Clear to auscultation without rales, wheezing or rhonchi  ABDOMEN: Soft, non-tender, non-distended EXTREMITIES:  No edema; No deformity  NEURO: Mild dyskinetic motions of the head, neck and shoulders.  Mild resting tremor  ASSESSMENT AND PLAN Complete heart block: Normal pacemaker function, asymptomatic.  Reportedly has had a narrow complex escape rhythm in the past but we did not check this today. PPM: Being monitored remotely by Dr. Inocencio.  Anticipate ERI in 1-35 months, most likely in 18 months.  She has only 20% atrial pacing but 99.8% ventricular pacing.  We briefly discussed the pacemaker change out protocol.  When she receives a new MRI conditional generator she will still not have an MRI conditional system unfortunately, since the ventricular lead is not MRI compatible. Neurogenic orthostatic hypotension: Doing pretty well on the current midodrine  regimen.  There is a little room to augment this if necessary.  We could also consider adding droxidopa and using an abdominal binder if she becomes more symptomatic.  She will look into the abdominal binder with her daughter's advice and she works for [employer]. Parkinson's: Seems to be pretty stable, followed by Dr. Evonnie. LV dysfunction: This is mild and related to pacing induced dyssynchrony.  In the absence of overt heart failure no treatment is necessary.  There does not appear to be a compelling reason to upgrade to CRT at this time. Nonobstructive CAD: Asymptomatic.  The focus would be on addressing risk factors.  She is taking aspirin .  Beta-blockers are contraindicated with her orthostatic hypotension. HLP: Most recent LDL cholesterol was elevated at 131.  Based on the findings at cardiac catheterization in 2018, ideally would like LDL cholesterol less than 70.  After cardiac  catheterization she was prescribed atorvastatin  but did not tolerate this.  I do not think other lipid-lowering agents have been tried.  We did not discuss this during the office appointment, but I will reach out to the patient to ask if she is willing to try an alternative agent. Elevated BP: In the office today the systolic blood pressure is 160.  I would tolerate systolic blood pressure in this range to avoid symptoms and falls due to orthostatic hypotension.       Dispo: No change in medications.  Try using abdominal binder, when upright for longer periods of time.  Follow-up in 1 year. Send her a message regarding initiation of rosuvastatin 10 mg daily.  Signed, Jerel Balding, MD

## 2024-05-05 ENCOUNTER — Encounter: Payer: Self-pay | Admitting: Cardiovascular Disease

## 2024-05-05 DIAGNOSIS — Z789 Other specified health status: Secondary | ICD-10-CM

## 2024-05-05 DIAGNOSIS — I2510 Atherosclerotic heart disease of native coronary artery without angina pectoris: Secondary | ICD-10-CM | POA: Insufficient documentation

## 2024-05-05 DIAGNOSIS — I519 Heart disease, unspecified: Secondary | ICD-10-CM | POA: Insufficient documentation

## 2024-05-05 DIAGNOSIS — E7800 Pure hypercholesterolemia, unspecified: Secondary | ICD-10-CM | POA: Insufficient documentation

## 2024-05-07 MED ORDER — REPATHA SURECLICK 140 MG/ML ~~LOC~~ SOAJ: 140.0000 mg | SUBCUTANEOUS | 2 refills | Status: AC

## 2024-05-07 NOTE — Telephone Encounter (Signed)
 Went over the message and recommendations from Dr [employer] with the patient and her spouse.  They would prefer using something other than a statin. Informed them that I will send this information to Dr Francyne and be back in touch with recommendations.  Gave them the number to call for help with MyChart- to get it up and running so they can send messages.   They verbalized understanding of all information.

## 2024-05-07 NOTE — Telephone Encounter (Signed)
 Please prescribe repatha  140 mg every 2 weeks, lipid panel in 3 months

## 2024-05-08 ENCOUNTER — Other Ambulatory Visit (HOSPITAL_COMMUNITY): Payer: Self-pay

## 2024-05-08 ENCOUNTER — Telehealth: Payer: Self-pay | Admitting: Pharmacy Technician

## 2024-05-08 NOTE — Telephone Encounter (Signed)
 Pharmacy Patient Advocate Encounter  Received notification from [employer] that Prior Authorization for Repatha  has been APPROVED from 05/08/24 to 05/23/24. Ran test claim, Copay is $144.24- one month. This test claim was processed through [facility]- copay amounts may vary at other pharmacies due to pharmacy/plan contracts, or as the patient moves through the different stages of their insurance plan.   PA #/Case ID/Reference #: [id number]

## 2024-05-08 NOTE — Telephone Encounter (Signed)
 Pharmacy Patient Advocate Encounter   Received notification from Physician's Office that prior authorization for Repatha  is required/requested.   Insurance verification completed.   The patient is insured through [employer].   Per test claim: PA required; PA submitted to above mentioned insurance via Latent Key/confirmation #/EOC [id number] Status is pending

## 2024-06-04 ENCOUNTER — Encounter: Payer: Self-pay | Admitting: *Deleted

## 2024-06-07 ENCOUNTER — Encounter: Payer: Self-pay | Admitting: Internal Medicine

## 2024-06-07 ENCOUNTER — Ambulatory Visit: Admitting: Internal Medicine

## 2024-06-07 VITALS — BP 138/80 | HR 78 | Ht 62.0 in | Wt 104.1 lb

## 2024-06-07 DIAGNOSIS — R55 Syncope and collapse: Secondary | ICD-10-CM

## 2024-06-07 DIAGNOSIS — R42 Dizziness and giddiness: Secondary | ICD-10-CM | POA: Diagnosis not present

## 2024-06-07 DIAGNOSIS — K5900 Constipation, unspecified: Secondary | ICD-10-CM

## 2024-06-07 DIAGNOSIS — R1032 Left lower quadrant pain: Secondary | ICD-10-CM

## 2024-06-07 NOTE — Progress Notes (Signed)
 "       Lindsey Bryant 84 y.o. October 28, 1940 [id number]  Assessment & Plan:   Encounter Diagnoses  Name Primary?   Constipation, unspecified constipation type Yes   LLQ pain    Postural dizziness with presyncope    Overall she is improved and really not having significant issues today. Symptoms controlled with bowel movements every three days, improved with pelvic floor physical therapy. Dicyclomine  reconsidered due to side effects. - Trial discontinuation of nighttime dicyclomine  to assess necessity and monitor symptoms.  Question if could help presyncope though presyncopal episodes tend to occur during the day after 10 mg of dicyclomine  would have cleared her body so unlikely but worthwhile trying to have less medication. - Continue polyethylene glycol (Miralax ) as needed. - Encouraged ongoing pelvic floor physical therapy.  Return to clinic in 1 year or sooner as needed    Subjective:   Chief Complaint: Follow-up of constipation and left lower quadrant pain problems HPI Discussed the use of AI scribe software for clinical note transcription with the patient, who gave verbal consent to proceed.  History of Present Illness   Lindsey Bryant Lindsey Bryant is an 84 year old female with Parkinson's disease, chronic constipation, abdominal pain and bladder dysfunction who presents for follow-up of chronic constipation.  Her husband was present and participated in the history and provided details that the patient could not remember  Constipation and Abdominal Symptoms: - Chronic constipation with bowel movements approximately every three days, which she finds acceptable - Associated with longstanding left lower quadrant abdominal pain and altered bowel habits - No current abdominal pain - Uses Miralax  as needed, typically if a bowel movement has not occurred the previous day she will take a dose - Takes dicyclomine  at night for abdominal spasm ('purple pill') - Sleep has improved and no  current pain related to chronic abdominal issues  Bladder Dysfunction and Urinary Frequency: - Persistent frequent urination despite pelvic floor physical therapy - Attending  pelvic floor physical therapy  which has benefited both bowel and bladder symptoms - Previously saw a urogynecologist but declined further testing, had high postvoid residual - Currently working with pelvic floor physical therapist   Orthostatic Symptoms and Blood Pressure Management: - Episodes of near-syncope and low blood pressure, with infrequent actual syncope - Able to warn her husband before an episode; sometimes experiences spells of confusion even while sitting - Uses a walker in the house - Prescribed midodrine  for blood pressure, but does not feel it raises her blood pressure - Amiodarone is also taken; medications started at 4:00 AM and completed by noon - Abdominal binder recommended for blood pressure management by previous cardiologist  Cardiac History: - Pacemaker in place - Follows with cardiology; previous cardiologist Dr. Fernande retired, now under care of Dr. Warnell dry - Last cardiology visit earlier this month  Sleep Pattern: - Goes to bed at 9:30 PM and wakes between 4:00 and 6:00 AM, which she finds difficult      Allergies[1] Active Medications[2] Past Medical History:  Diagnosis Date   Acute systolic heart failure (HCC)    Arm fracture    Bradycardia    a. 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   Parkinson's disease    Past Surgical History:  Procedure Laterality Date   APPENDECTOMY  1952   COLONOSCOPY  EYE SURGERY  07/2017   Cataract Sx   LEFT HEART CATH AND CORONARY ANGIOGRAPHY N/A 08/03/2016   Procedure: Left Heart Cath and Coronary Angiography;  Surgeon: Alm LELON Clay, MD;  Location: [facility] INVASIVE CV LAB;  Service:  Cardiovascular;  Laterality: N/A;   MELANOMA EXCISION Right 2000   right arm   PACEMAKER PLACEMENT  05/2012   PACEMAKER REVISION N/A 03/14/2012   Procedure: PACEMAKER REVISION;  Surgeon: Lynwood Rakers, MD;  Location: [facility] CATH LAB;  Service: Cardiovascular;  Laterality: N/A;   PERMANENT PACEMAKER INSERTION N/A 03/06/2012   Procedure: PERMANENT PACEMAKER INSERTION;  Surgeon: Elspeth JAYSON Sage, MD;  Location: [facility] CATH LAB;  Service: Cardiovascular;  Laterality: N/A;   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 2 daughters I think.   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      Right Handed   family history includes Alcohol abuse in her father; Breast cancer in an other family member; Breast cancer (age of onset: 52) in her sister; Diabetes in her brother; Heart attack (age of onset: 89) in her mother; Hypothyroidism in her daughter and daughter.   Review of Systems As per HPI  Objective:   Physical Exam BP 138/80   Pulse 78   Ht 5' 2 (1.575 m)   Wt 104 lb 2 oz (47.2 kg)   BMI 19.04 kg/m  Masked facies, shuffling gait alert and oriented x 3 Abdomen is soft nontender no mass     [1]  Allergies Allergen Reactions   Atorvastatin  Calcium  Other (See Comments)    Muscle/joint pain   Oxycodone  Other (See Comments)  [2]  Current Meds  Medication Sig   aspirin  EC 81 MG tablet Take 81 mg by mouth daily. Swallow whole. (Patient taking differently: Take 81 mg by mouth 2 (two) times a week. Swallow whole.)   carbidopa -levodopa  (SINEMET  IR) 25-100 MG tablet TAKE 2 TABLETS BY MOUTH AT 6AM/9AM/NOON/3PM/6PM   Carbidopa -Levodopa  ER (SINEMET  CR) 25-100 MG tablet controlled release TAKE 1 TABLET BY MOUTH EVERYDAY AT BEDTIME   dicyclomine  (BENTYL ) 10 MG capsule TAKE 1 CAPSULE (10 MG TOTAL) BY MOUTH 3 (THREE) TIMES DAILY BETWEEN MEALS AS NEEDED FOR SPASMS. (Patient taking differently: Take 10 mg by mouth  once.)   estradiol  (ESTRACE  VAGINAL) 0.1 MG/GM vaginal cream Apply as directed 3 times weekly per vagina   Evolocumab  (REPATHA  SURECLICK) 140 MG/ML SOAJ Inject 140 mg into the skin every 14 (fourteen) days.   melatonin 5 MG TABS Take 5 mg by mouth at bedtime.   midodrine  (PROAMATINE ) 5 MG tablet TAKE 2 TABLETS AT BREAKFAST , TAKE 2 TABLETS AT LUNCH ,TAKE 1 TABLET AT DINNER   polyethylene glycol powder (GLYCOLAX /MIRALAX ) 17 GM/SCOOP powder Take 34 g by mouth daily.   Probiotic Product (PROBIOTIC PO) Take 1 capsule by mouth daily as needed.   Propylene Glycol (SYSTANE COMPLETE OP) Apply 1 drop to eye daily.   Simethicone  (GAS-X PO) Take 1 tablet by mouth as needed. After meals and at bedtime   VITAMIN E PO Take 1 capsule by mouth daily.   "

## 2024-06-07 NOTE — Patient Instructions (Addendum)
 Try going without the dicyclomine  to see if that helps with the dizziness.  Keep doing what you are doing, otherwise.  I appreciate the opportunity to care for you. Lupita CHARLENA Commander, MD, [facility]                         .

## 2024-06-16 ENCOUNTER — Other Ambulatory Visit: Payer: Self-pay | Admitting: Neurology

## 2024-06-16 DIAGNOSIS — G20B1 Parkinson's disease with dyskinesia, without mention of fluctuations: Secondary | ICD-10-CM

## 2024-06-21 ENCOUNTER — Other Ambulatory Visit: Payer: Self-pay | Admitting: Neurology

## 2024-06-21 DIAGNOSIS — G20B1 Parkinson's disease with dyskinesia, without mention of fluctuations: Secondary | ICD-10-CM

## 2024-07-06 ENCOUNTER — Ambulatory Visit: Admitting: Neurology

## 2024-07-25 ENCOUNTER — Ambulatory Visit

## 2024-07-31 ENCOUNTER — Ambulatory Visit: Admitting: Neurology

## 2024-10-24 ENCOUNTER — Ambulatory Visit

## 2025-01-23 ENCOUNTER — Ambulatory Visit

## 2025-04-24 ENCOUNTER — Ambulatory Visit

## 2025-07-24 ENCOUNTER — Ambulatory Visit
